# Patient Record
Sex: Female | Born: 1935 | Race: White | Hispanic: No | Smoking: Never smoker
Health system: Southern US, Community
[De-identification: ages and names within clinical notes are randomized; demographics above are authoritative.]

## PROBLEM LIST (undated history)

## (undated) DIAGNOSIS — H9190 Unspecified hearing loss, unspecified ear: Secondary | ICD-10-CM

## (undated) DIAGNOSIS — K219 Gastro-esophageal reflux disease without esophagitis: Secondary | ICD-10-CM

## (undated) DIAGNOSIS — T7840XA Allergy, unspecified, initial encounter: Secondary | ICD-10-CM

## (undated) DIAGNOSIS — T8859XA Other complications of anesthesia, initial encounter: Secondary | ICD-10-CM

## (undated) DIAGNOSIS — F329 Major depressive disorder, single episode, unspecified: Secondary | ICD-10-CM

## (undated) DIAGNOSIS — R112 Nausea with vomiting, unspecified: Secondary | ICD-10-CM

## (undated) DIAGNOSIS — M549 Dorsalgia, unspecified: Secondary | ICD-10-CM

## (undated) DIAGNOSIS — E039 Hypothyroidism, unspecified: Secondary | ICD-10-CM

## (undated) DIAGNOSIS — G20A1 Parkinson's disease without dyskinesia, without mention of fluctuations: Secondary | ICD-10-CM

## (undated) DIAGNOSIS — M1990 Unspecified osteoarthritis, unspecified site: Secondary | ICD-10-CM

## (undated) DIAGNOSIS — Z9889 Other specified postprocedural states: Secondary | ICD-10-CM

## (undated) DIAGNOSIS — E7800 Pure hypercholesterolemia, unspecified: Secondary | ICD-10-CM

## (undated) DIAGNOSIS — G20 Parkinson's disease: Secondary | ICD-10-CM

## (undated) DIAGNOSIS — J45909 Unspecified asthma, uncomplicated: Secondary | ICD-10-CM

## (undated) DIAGNOSIS — F419 Anxiety disorder, unspecified: Secondary | ICD-10-CM

## (undated) DIAGNOSIS — M25473 Effusion, unspecified ankle: Secondary | ICD-10-CM

## (undated) DIAGNOSIS — T4145XA Adverse effect of unspecified anesthetic, initial encounter: Secondary | ICD-10-CM

## (undated) DIAGNOSIS — J302 Other seasonal allergic rhinitis: Secondary | ICD-10-CM

## (undated) DIAGNOSIS — F32A Depression, unspecified: Secondary | ICD-10-CM

## (undated) HISTORY — PX: EYE SURGERY: SHX253

## (undated) HISTORY — DX: Hypothyroidism, unspecified: E03.9

## (undated) HISTORY — PX: ABDOMINAL HYSTERECTOMY: SHX81

## (undated) HISTORY — PX: NISSEN FUNDOPLICATION: SHX2091

## (undated) HISTORY — PX: KNEE ARTHROSCOPY: SUR90

## (undated) HISTORY — PX: CHOLECYSTECTOMY: SHX55

## (undated) HISTORY — DX: Allergy, unspecified, initial encounter: T78.40XA

---

## 1960-02-27 DIAGNOSIS — R58 Hemorrhage, not elsewhere classified: Secondary | ICD-10-CM

## 2000-11-24 ENCOUNTER — Ambulatory Visit (HOSPITAL_COMMUNITY): Admission: RE | Admit: 2000-11-24 | Discharge: 2000-11-24 | Payer: Self-pay | Admitting: Ophthalmology

## 2000-11-24 ENCOUNTER — Encounter: Payer: Self-pay | Admitting: Ophthalmology

## 2000-12-14 ENCOUNTER — Ambulatory Visit (HOSPITAL_COMMUNITY): Admission: RE | Admit: 2000-12-14 | Discharge: 2000-12-14 | Payer: Self-pay | Admitting: Family Medicine

## 2000-12-14 ENCOUNTER — Encounter: Payer: Self-pay | Admitting: Family Medicine

## 2001-02-06 ENCOUNTER — Ambulatory Visit (HOSPITAL_COMMUNITY): Admission: RE | Admit: 2001-02-06 | Discharge: 2001-02-06 | Payer: Self-pay | Admitting: Orthopedic Surgery

## 2001-04-07 ENCOUNTER — Ambulatory Visit (HOSPITAL_COMMUNITY): Admission: RE | Admit: 2001-04-07 | Discharge: 2001-04-07 | Payer: Self-pay | Admitting: Ophthalmology

## 2001-09-02 ENCOUNTER — Other Ambulatory Visit: Admission: RE | Admit: 2001-09-02 | Discharge: 2001-09-02 | Payer: Self-pay | Admitting: Family Medicine

## 2001-12-22 ENCOUNTER — Ambulatory Visit (HOSPITAL_COMMUNITY): Admission: RE | Admit: 2001-12-22 | Discharge: 2001-12-22 | Payer: Self-pay | Admitting: Family Medicine

## 2001-12-22 ENCOUNTER — Encounter: Payer: Self-pay | Admitting: Family Medicine

## 2001-12-24 ENCOUNTER — Encounter: Payer: Self-pay | Admitting: Family Medicine

## 2001-12-24 ENCOUNTER — Ambulatory Visit (HOSPITAL_COMMUNITY): Admission: RE | Admit: 2001-12-24 | Discharge: 2001-12-24 | Payer: Self-pay | Admitting: Family Medicine

## 2001-12-31 ENCOUNTER — Ambulatory Visit (HOSPITAL_COMMUNITY): Admission: RE | Admit: 2001-12-31 | Discharge: 2001-12-31 | Payer: Self-pay | Admitting: Family Medicine

## 2001-12-31 ENCOUNTER — Encounter: Payer: Self-pay | Admitting: Family Medicine

## 2002-01-07 ENCOUNTER — Encounter: Payer: Self-pay | Admitting: Family Medicine

## 2002-01-07 ENCOUNTER — Ambulatory Visit (HOSPITAL_COMMUNITY): Admission: RE | Admit: 2002-01-07 | Discharge: 2002-01-07 | Payer: Self-pay | Admitting: Family Medicine

## 2003-12-06 ENCOUNTER — Ambulatory Visit (HOSPITAL_COMMUNITY): Admission: RE | Admit: 2003-12-06 | Discharge: 2003-12-06 | Payer: Self-pay | Admitting: Unknown Physician Specialty

## 2003-12-09 ENCOUNTER — Ambulatory Visit (HOSPITAL_COMMUNITY): Admission: RE | Admit: 2003-12-09 | Discharge: 2003-12-09 | Payer: Self-pay | Admitting: Pulmonary Disease

## 2004-03-08 ENCOUNTER — Encounter: Admission: RE | Admit: 2004-03-08 | Discharge: 2004-06-06 | Payer: Self-pay | Admitting: Neurological Surgery

## 2004-06-07 ENCOUNTER — Encounter: Admission: RE | Admit: 2004-06-07 | Discharge: 2004-07-30 | Payer: Self-pay | Admitting: Neurological Surgery

## 2004-06-18 ENCOUNTER — Ambulatory Visit (HOSPITAL_COMMUNITY): Admission: RE | Admit: 2004-06-18 | Discharge: 2004-06-18 | Payer: Self-pay | Admitting: Neurological Surgery

## 2004-06-30 ENCOUNTER — Emergency Department (HOSPITAL_COMMUNITY): Admission: EM | Admit: 2004-06-30 | Discharge: 2004-06-30 | Payer: Self-pay | Admitting: Emergency Medicine

## 2004-07-20 ENCOUNTER — Ambulatory Visit (HOSPITAL_COMMUNITY): Admission: RE | Admit: 2004-07-20 | Discharge: 2004-07-20 | Payer: Self-pay | Admitting: Family Medicine

## 2004-12-22 ENCOUNTER — Emergency Department (HOSPITAL_COMMUNITY): Admission: EM | Admit: 2004-12-22 | Discharge: 2004-12-22 | Payer: Self-pay | Admitting: *Deleted

## 2005-02-04 ENCOUNTER — Emergency Department (HOSPITAL_COMMUNITY): Admission: EM | Admit: 2005-02-04 | Discharge: 2005-02-04 | Payer: Self-pay | Admitting: Emergency Medicine

## 2005-03-26 ENCOUNTER — Encounter: Admission: RE | Admit: 2005-03-26 | Discharge: 2005-03-26 | Payer: Self-pay | Admitting: Neurological Surgery

## 2005-05-06 ENCOUNTER — Inpatient Hospital Stay (HOSPITAL_COMMUNITY): Admission: EM | Admit: 2005-05-06 | Discharge: 2005-05-08 | Payer: Self-pay | Admitting: *Deleted

## 2006-05-06 ENCOUNTER — Ambulatory Visit: Payer: Self-pay | Admitting: Internal Medicine

## 2006-06-17 ENCOUNTER — Ambulatory Visit: Payer: Self-pay | Admitting: Internal Medicine

## 2006-07-28 ENCOUNTER — Ambulatory Visit: Payer: Self-pay | Admitting: Internal Medicine

## 2006-12-01 ENCOUNTER — Ambulatory Visit (HOSPITAL_COMMUNITY): Admission: RE | Admit: 2006-12-01 | Discharge: 2006-12-02 | Payer: Self-pay | Admitting: Obstetrics & Gynecology

## 2007-06-23 ENCOUNTER — Ambulatory Visit (HOSPITAL_COMMUNITY): Admission: RE | Admit: 2007-06-23 | Discharge: 2007-06-23 | Payer: Self-pay | Admitting: Family Medicine

## 2009-06-25 ENCOUNTER — Emergency Department (HOSPITAL_COMMUNITY): Admission: EM | Admit: 2009-06-25 | Discharge: 2009-06-25 | Payer: Self-pay | Admitting: Emergency Medicine

## 2009-12-01 ENCOUNTER — Emergency Department (HOSPITAL_COMMUNITY): Admission: EM | Admit: 2009-12-01 | Discharge: 2009-12-01 | Payer: Self-pay | Admitting: Emergency Medicine

## 2010-02-17 ENCOUNTER — Emergency Department (HOSPITAL_COMMUNITY): Admission: EM | Admit: 2010-02-17 | Discharge: 2010-02-17 | Payer: Self-pay | Admitting: Emergency Medicine

## 2010-03-09 ENCOUNTER — Ambulatory Visit (HOSPITAL_COMMUNITY): Admission: RE | Admit: 2010-03-09 | Discharge: 2010-03-09 | Payer: Self-pay | Admitting: Family Medicine

## 2010-08-26 LAB — URINALYSIS, ROUTINE W REFLEX MICROSCOPIC
Bilirubin Urine: NEGATIVE
Glucose, UA: NEGATIVE mg/dL
Hgb urine dipstick: NEGATIVE
Ketones, ur: NEGATIVE mg/dL
Nitrite: NEGATIVE
Protein, ur: NEGATIVE mg/dL
Specific Gravity, Urine: 1.015 (ref 1.005–1.030)
Urobilinogen, UA: 0.2 mg/dL (ref 0.0–1.0)
pH: 8 (ref 5.0–8.0)

## 2010-08-26 LAB — URINE MICROSCOPIC-ADD ON

## 2010-08-28 ENCOUNTER — Other Ambulatory Visit: Payer: Self-pay | Admitting: Neurological Surgery

## 2010-08-28 DIAGNOSIS — M48061 Spinal stenosis, lumbar region without neurogenic claudication: Secondary | ICD-10-CM

## 2010-08-28 DIAGNOSIS — M47812 Spondylosis without myelopathy or radiculopathy, cervical region: Secondary | ICD-10-CM

## 2010-09-05 ENCOUNTER — Ambulatory Visit
Admission: RE | Admit: 2010-09-05 | Discharge: 2010-09-05 | Disposition: A | Discharge status: Home / Self Care | Payer: Medicare Other | Source: Ambulatory Visit | Attending: Neurological Surgery | Admitting: Neurological Surgery

## 2010-09-05 DIAGNOSIS — M48061 Spinal stenosis, lumbar region without neurogenic claudication: Secondary | ICD-10-CM

## 2010-09-05 DIAGNOSIS — M47812 Spondylosis without myelopathy or radiculopathy, cervical region: Secondary | ICD-10-CM

## 2010-09-25 ENCOUNTER — Other Ambulatory Visit (HOSPITAL_COMMUNITY): Payer: Self-pay | Admitting: Family Medicine

## 2010-09-25 DIAGNOSIS — E041 Nontoxic single thyroid nodule: Secondary | ICD-10-CM

## 2010-09-28 ENCOUNTER — Ambulatory Visit (HOSPITAL_COMMUNITY)
Admission: RE | Admit: 2010-09-28 | Discharge: 2010-09-28 | Disposition: A | Discharge status: Home / Self Care | Payer: Medicare Other | Source: Ambulatory Visit | Attending: Family Medicine | Admitting: Family Medicine

## 2010-09-28 DIAGNOSIS — E041 Nontoxic single thyroid nodule: Secondary | ICD-10-CM

## 2010-09-28 DIAGNOSIS — E049 Nontoxic goiter, unspecified: Secondary | ICD-10-CM | POA: Insufficient documentation

## 2010-10-18 ENCOUNTER — Other Ambulatory Visit: Payer: Self-pay | Admitting: Neurological Surgery

## 2010-10-18 DIAGNOSIS — M4716 Other spondylosis with myelopathy, lumbar region: Secondary | ICD-10-CM

## 2010-10-19 ENCOUNTER — Ambulatory Visit
Admission: RE | Admit: 2010-10-19 | Discharge: 2010-10-19 | Disposition: A | Discharge status: Home / Self Care | Payer: Medicare Other | Source: Ambulatory Visit | Attending: Neurological Surgery | Admitting: Neurological Surgery

## 2010-10-19 DIAGNOSIS — M4716 Other spondylosis with myelopathy, lumbar region: Secondary | ICD-10-CM

## 2010-10-23 NOTE — Op Note (Signed)
NAME:  [name], [name] NO.:  [id number]   MEDICAL RECORD NO.:  [id number]          PATIENT TYPE:  AMB   LOCATION:  [place]                           FACILITY:  [facility]   PHYSICIAN:  Alden Hipp, M.D.   DATE OF BIRTH:  Sep 24, 1933   DATE OF PROCEDURE:  12/01/2006  DATE OF DISCHARGE:                               OPERATIVE REPORT   PREOPERATIVE DIAGNOSIS:  Symptomatic rectocele.   POSTOPERATIVE DIAGNOSIS:  Symptomatic rectocele.   PROCEDURE:  Posterior repair.   SURGEON:  Alden Hipp, M.D.   ASSISTANT:  Lenard Galloway, M.D.   ANESTHESIA:  General endotracheal.   ESTIMATED BLOOD LOSS:  Less than 100 mL.   FINDINGS:  A third-degree rectocele. Minimal (1) cystocele,  and good  vaginal vault support.   INDICATIONS:  This is a 77 year old female who noticed vaginal bulging  for the last 1 to 2 months. She was evaluated by her family physician,  Dr. Leslie Andrea, who diagnosed a rectocele and referred her for  evaluation. The findings were discussed with the patient. She did have  some symptoms of urinary incontinence, but these were minimal and  appeared to be mixed between urgency incontinence and genuine stress  incontinence. The options were discussed with the patient to proceed  with cystometric's to determine the best course of treatment for her  incontinence, but the patient felt at this point in time that was not  her major problem and wants to proceed with the rectocele repair.   PROCEDURE:  The patient was taken to the operating room and placed under  general anesthesia. The perineum and vagina were prepped and draped in  sterile fashion. The perineal body was incised, and the mucosa over the  rectocele was dissected free. The rectocele was repaired with  interrupted 2-0 Vicryl sutures. The perineal body was supported  with 2-0 Vicryl suture. After the repair had been performed, the vaginal  mucosa was reapposed with running interlocking 2-0 Vicryl  suture, and  the skin over the peritoneum was closed with subcuticular 2-0 Vicryl  suture. The patient tolerated the procedure well and left the operating  room in good condition.      Alden Hipp, M.D.  Electronically Signed     RK/MEDQ  D:  12/01/2006  T:  12/01/2006  Job:  [id number]   cc:   Estill Bamberg. Karie Kirks, M.D.  Fax: [phone]

## 2010-10-23 NOTE — H&P (Signed)
[name], [name] NO.:  [id number]   MEDICAL RECORD NO.:  [id number]         PATIENT TYPE:  WOIB   LOCATION:                                FACILITY:  [facility]   PHYSICIAN:  Alden Hipp, M.D.   DATE OF BIRTH:  03/22/1934   DATE OF ADMISSION:  12/01/2006  DATE OF DISCHARGE:  12/02/2006                              HISTORY & PHYSICAL   CHIEF COMPLAINT:  Rectocele.   HISTORY OF PRESENT ILLNESS:  This is a 77 year old gravida 2, para 2,  female who noticed bulging from her vagina over the last two months or  so.  This problem has made it difficult for her to pass her stools, as  far as putting her fingers in her vagina for posterior support in order  to pass a stool. The patient has some urinary incontinence  but this  sounds like urgency incontinence where the patient has mostly trips to  the bathroom at night and also has difficulty controlling her urine  mainly when her bladder is full and the urge presents.   PAST MEDICAL HISTORY:  1. Irritable bowel syndrome.  2. Paraesophageal reflux disease.  3. History of back pain.  4. History of Parkinson's disease.  5. Chronic Bronchitis  6. Pedal edema unknown etiology   PAST SURGICAL HISTORY:  1. Hysterectomy and anterior repair in 1975.  2. Gallbladder surgery in 1998.  3. Paraesophageal hernia reflux repair in April 2001.  4. Repair of knee cartilage in August 2002.  5. Eye surgery, macular surgery done in June 2002, cataract surgery in      November 2002.   MEDICATIONS:  1. Furosemide 40 mg daily.  2. Prevacid one capsule every other day.  3. Naprosyn two times a day.  4. Zoloft for depression.  5. Potassium chloride two per day.  6. Mirapex two in the morning.  7. Alprazolam 0.5 mg at bedtime.  8. Singulair 5 mg in the middle of the day.  9. Albuterol inhaler two puffs every four hours as needed.  10.Hydrocodone occasionally but she has had that in months and that      was for her back pain.  11.Zyrtec and Nasonex for allergies.   ALLERGIES:  1. Keflex  2. Latex  3. Demerol    She has never received a blood transfusion.   SOCIAL HISTORY:  She is retired.  She has been married 64 years.  She is  a nonsmoker and she exercises regularly with walking.   FAMILY HISTORY:  Positive for heart disease in a brother and two uncles.  Stroke in her father and mother.  Diabetes in a grandfather and  grandmother.  She has no family history of breast, ovarian, uterine, or  colon cancer.   REVIEW OF SYSTEMS:  Noncontributory.   PHYSICAL EXAMINATION:  VITAL SIGNS:  She is 5 feet 5 inches tall, 186  pounds.  Blood pressure 110/70,  HEENT:  Normal.  NECK:  Supple without thyromegaly.  CHEST:  Clear and no rales were appreciated.  HEART:  Regular rate and rhythm without murmurs or gallops.  ABDOMEN:  Soft, without hepatosplenomegaly.  BREASTS:  Without masses.  PELVIC:  3+ cystocele.  Her anterior support was good with minimal  cystocele with Valsalva.  Her cervix and uterus were absent and her  adnexa were not palpable.   IMPRESSION:  The patient's primary problem is a rectocele.  She has some  mild urinary incontinence which may or may not be related to stress but  sounded more like urgency.  A long discussion was carried out with the  patient about doing bladder studies prior to surgery in case a combined  procedure would be beneficial.  The patient felt at this time that she  would prefer just to proceed with the rectocele repair because the  urinary symptoms were not of significant concern to her at this time.   PLAN:  Admit to the hospital for a repair of rectocele.      Alden Hipp, M.D.  Electronically Signed     RK/MEDQ  D:  11/30/2006  T:  11/30/2006  Job:  [id number]

## 2010-10-26 NOTE — Op Note (Signed)
[facility]  Patient:    GRAYSON, WHITE Visit Number: [id number] MRN: [id number]          Service Type: DSU Location: DAY Attending Physician:  Tarri Glenn Page Dictated by:   Laurice Record. Aplington, M.D. Proc. Date: 02/06/01 Admit Date:  02/06/2001                             Operative Report  PREOPERATIVE DIAGNOSIS:   Torn medial meniscus, right knee.  POSTOPERATIVE DIAGNOSIS:  Torn medial meniscus, right knee.  OPERATION PERFORMED:  Right knee arthroscopy with partial medial meniscectomy.  SURGEON:  Laurice Record. Aplington, M.D.  ASSISTANT:  Nurse.  ANESTHESIA:  General.  PATHOLOGY AND JUSTIFICATION FOR PROCEDURE:  She developed pain in her right knee around the end of June. Plain x-rays looked normal but an MRI demonstrated anterior and posterior horn tears of the medial meniscus and this was confirmed at surgery. Because of the severe pain, she is here today for the arthroscopic intervention.  DESCRIPTION OF PROCEDURE:  Satisfactory general anesthesia, pneumatic tourniquet, thigh stabilizer, the right knee was prepped with Duraprep, draped in a sterile field. Superior and medial saline inflow. First through and anterolateral portal, the medial compartment of the knee joint was evaluated. There was a good bit of disruption of the anterior medial meniscus and a bucket handle type tear was noted. I managed this by cutting the posterior portion of the bucket handle at the junction of the anterior mid third with scissors and then removing the fragment with a combination of baskets and the 3.5 shaver, I smoothed down the junction of the torn and normal meniscus with scissors. Final pictures were taken with the remaining rim being stable on probing. She had some very minimal wear of both the medial and femoral condyle and medial tibial plateau grade 1 out of 4. Posteriorly I noted a fragment of meniscus which I removed. She then had a small radial  tear of the posterior curve which I resected back to a stable rim with baskets and shaved down until smooth with a 3.5 shaver. Looking up in the medial gutter and suprapatellar area, no abnormalities were noted. I then reversed portals. The ACL was intact, the lateral joint was basically normal. There was some very minimal fraying of the lateral meniscus which did not require shaving. The knee joint was then irrigated until clear and all fluid possible removed. The two anterior portals were closed with 4-0 nylon, 20 cc of 0.5% Marcaine with adrenaline and 4 mg of morphine were then instilled through the inflap rasp which was removed and this portal closed as well with 4-0 nylon. Betadine Adaptic dry sterile dressing were applied. Tourniquet was released. The patient tolerated the procedure well and was taken to the recovery room in satisfactory condition with no known complications. Dictated by:   Laurice Record. Aplington, M.D. Attending Physician:  Clare Gandy DD:  02/06/01 TD:  02/06/01 Job: [id number]

## 2010-10-26 NOTE — Group Therapy Note (Signed)
NAMELYNETTE, TOPETE                ACCOUNT NO.:  [id number]   MEDICAL RECORD NO.:  [id number]          PATIENT TYPE:  INP   LOCATION:  A313                          FACILITY:  [facility]   PHYSICIAN:  Estill Bamberg. Karie Kirks, M.D.DATE OF BIRTH:  1934/05/04   DATE OF PROCEDURE:  05/07/2005  DATE OF DISCHARGE:                                   PROGRESS NOTE   SUBJECTIVE:  She is feeling better today.  She is making many trips to the  bathroom and actually gets there now, which is definitely improved.  She  feels her abdomen is rumbling a lot, but really no significant pain.   OBJECTIVE:  VITAL SIGNS:  Temperature 97.1, pulse 85, respirations 18, blood  pressure 129/69.  GENERAL APPEARANCE:  She is supine in bed.  No acute distress.  Well-  developed, well-nourished.  Nontoxic.  ABDOMEN:  Soft, without hepatosplenomegaly or mass or tenderness.  Bowel  sounds are very active, however.  HEART:  Regular rhythm rate of about 80.  LUNGS:  Clear.  Moving air well.  EXTREMITIES:  No edema in the ankles.  SKIN:  Color is pretty good.   LABORATORY DATA:  Today, the Met-7 showed potassium 3.1, chloride 113, BUN  5, creatinine 1.0 and clostridium difficile antigen test x3 was negative.  Occult blood was positive x2, negative x1.  Fecal white cells and OMP was  negative.   ASSESSMENT:  Probably viral gastroenteritis.   PLAN:  Check culture for salmonella, shigella.  Continue Levaquin 5 mg IV  daily.  Add K-Ciel 20 mEq p.o. b.i.d.  Recheck potassium level in the  morning.  Up in chair.      Estill Bamberg. Karie Kirks, M.D.  Electronically Signed     SDK/MEDQ  D:  05/07/2005  T:  05/08/2005  Job:  [id number]

## 2010-10-26 NOTE — Op Note (Signed)
[facility]  Patient:    CHARLETTA, VOIGHT Visit Number: [id number] MRN: [id number]          Service Type: DSU Location: DAY Attending Physician:  Tarri Glenn Page Proc. Date: 02/06/01 Admit Date:  02/06/2001                             Operative Report  No dictation. Attending Physician:  Clare Gandy DD:  02/06/01 TD:  02/06/01 Job: [id number]

## 2010-10-26 NOTE — H&P (Signed)
NAMELOELLA, HICKLE                ACCOUNT NO.:  [id number]   MEDICAL RECORD NO.:  [id number]          PATIENT TYPE:  OBV   LOCATION:  A313                          FACILITY:  [facility]   PHYSICIAN:  Estill Bamberg. Karie Kirks, M.D.DATE OF BIRTH:  09-07-1933   DATE OF ADMISSION:  05/05/2005  DATE OF DISCHARGE:  LH                                HISTORY & PHYSICAL   HISTORY OF PRESENT ILLNESS:  This 77 year old woman was doing well the  morning of admission, and then she had sudden onset of nausea, vomiting and  diarrhea, really without abdominal pain.  In the past, she had a Nissen  fundoplication, has not been able to vomit since then.  She was vomiting  yesterday.  She felt just miserable.   She denied earaches, sore throat, facial pain, but had a burning discomfort  in the anterior neck.  She does have a cough occasionally, productive of  sputum.  She has had no cardiac symptoms.  She is free of GU symptomatology,  but has the GI symptomatology as above, including copious diarrhea.  She had  shaking chills last night.   CURRENT MEDICATIONS:  1.  Zoloft 100 mg daily.  2.  Naprosyn 5 mg b.i.d.  3.  Prevacid 30 mg daily.  4.  Premarin 0.3 mg daily.  5.  Zyrtec 10 mg p.r.n. allergy.  6.  Benadryl 25 mg p.r.n. allergy.  7.  Vytorin q.h.s.  8.  Alprazolam 0.5 mg p.r.n.  9.  Hydrocodone half tablet p.r.n.  10. Lasix daily.  11. Fish oil.  12. Multivitamin.  13. Calcium is Os-Cal.   PAIN MANAGEMENT:  1.  Chronic back pain secondary to lumbar disk disease.  2.  Estrogen replacement.  3.  Depression.   PHYSICAL EXAMINATION:  VITAL SIGNS:  Temperature 96.7, blood pressure  103/61, pulse 84, respirations 20.  O2 saturation 98% on room air.  GENERAL APPEARANCE:  At the time I examined her, she said she still felt  miserable, but somewhat better having IV fluids and some medications.  She  was oriented and alert, no acute distress.  Well-developed, well-nourished.  HEENT:  Mucous membranes  are dry, but the oral cavity was otherwise normal.  There were negative anterior cervical nodes.  There was no axillary or  supraclavicular adenopathy.  LUNGS:  Clear throughout.  She was moving air well.  Color was good.  HEART:  Regular rate and rhythm of about 80 without murmurs.  ABDOMEN:  Soft without hepatosplenomegaly or mass.  Minimal tenderness.  EXTREMITIES:  No edema of the ankles.   LABORATORY DATA:  Admission white cell count was 20,000 with 32% bands, 63  neutrophils, 3 lymphs, but by this morning, dropped to 10,500 with 93  neutrophils and 3 lymphs.  Met-7 on admission showed sodium of 133, now 135.  Glucose on admission was 151 now 143.   ADMISSION DIAGNOSES:  Probable vital gastroenteritis.   OTHER DIAGNOSES:  1.  Given the increased band count, can include pneumonia or pyelonephritis.  2.  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  3.  Status post  esophageal surgery at [facility] for a hiatal hernia.   PLAN:  Discussed all this with the patient and her husband  She is currently  on IV fluids, normal saline 125 cc an hour, Levaquin 500 mg IV q.24 h,  Protonix 40 mg IV daily, Zofran 8 mg IV q.8 h p.r.n. nausea and vomiting and  Imodium loose BM's.  Chest x-ray and guaiac testing of stool was pending.  She is on Zoloft 100 mg p.o. daily, Mirapex 0.125 mg daily, alprazolam 0.5  mg q.h.s. and Vicodin 7.5/500 p.r.n. pain q.6 h.      Estill Bamberg. Karie Kirks, M.D.  Electronically Signed     SDK/MEDQ  D:  05/06/2005  T:  05/06/2005  Job:  [id number]

## 2010-10-26 NOTE — Op Note (Signed)
[facility]  Patient:    [name], [name]                       MRN: [id number] Proc. Date: 11/24/00 Adm. Date:  [id number] Disc. 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  PROCEDURE:  Posterior vitrectomy with membrane peel left eye.  Epiretinal tissue with internal limiting membrane removal.  SURGEON:  Nolon Bussing, M.D.  ANESTHESIA:  Local retrobulbar anesthesia control.  INDICATIONS:  The patient is a 77 year old woman with significant vision loss on the basis of epiretinal membrane, topographic distortion, internal limiting membrane striae.  This is an attempt to remove the traction so as to allow the fovea to function.  The patient understands the risks of anesthesia including the rare occurrence of death, but also to the eye including hemorrhage, infection, scarring, need for further surgery, no change in vision, loss of vision, or progression of disease despite intervention.  Appropriate signed consent was obtained.  DESCRIPTION OF PROCEDURE:  The patient was taken to the operating room.  In the operating room, appropriate monitoring followed by mild sedation.  0.75% Marcaine diluted with 5 cc retrobulbar and 5 cc laterally fashion modified Kirk Ruths.  The left periocular region was sterilely prepped and draped in the usual ophthalmic fashion.  A lid speculum was applied.  Conjunctival peritomy fashion temporally and superonasally.  A 4 mm infusion was then secured 4 mm posterior to the limbus in the inferotemporal quadrant.  Placement in the vitreous cavity verified visually.  Superior sclerotomy was then fashion. Wild microscope placed into the  position with a Biome attachment.  Core vitrectomy was then begun.  Iatrogenic placement of vitreous detachment was necessary.  The vitreous skirt was then elevated and trimmed 360 degrees anterior to the equator.  The internal limiting membrane was engaged with a rice pick and with the Dorc forceps removed in a circumferential fashion around the edge of the fovea.  All obvious tractions of foveal topography had been removed.  At this time, peripheral retina was inspected and found to be free of retinal holes or tears.  The instruments were removed from the eye.  Superior sclerotomy closed with 7-0 Vicryl suture.  The infusion was removed and similarly closed with 7-0 Vicryl suture.  Subconjunctival injection of antibiotics were applied.  Conjunctiva had also been closed with 7-0 Vicryl suture.  A sterile patch and Fox shield applied to the left eye.  The patient was taken to the shortstay area to be discharged home as an outpatient to be seen the next day in the office setting. DD:  11/24/00 TD:  11/24/00 Job: [id number]

## 2010-10-26 NOTE — Discharge Summary (Signed)
[name], [name]                ACCOUNT NO.:  [id number]   MEDICAL RECORD NO.:  [id number]          PATIENT TYPE:  INP   LOCATION:  A313                          FACILITY:  [facility]   PHYSICIAN:  Estill Bamberg. Karie Kirks, M.D.DATE OF BIRTH:  08-03-1933   DATE OF ADMISSION:  05/05/2005  DATE OF DISCHARGE:  11/29/2006LH                                 DISCHARGE SUMMARY   This 77 year old woman was admitted with what appeared to be a viral  gastroenteritis but she had essentially a 4-day hospitalization extending  from May 05, 2005 to May 08, 2005.  She was put in under  observation the first day but needed a full admission May 06, 2005.   Lab work in the hospital showed admission white cell count of 20,000 of  which 32% were bands, 63 neutrophils, 3 lymphs, 2 mono's.  The H&H were 14.5  and 42.3, platelet count 13,000.  Her CMP showed a sodium 133, potassium  3.6, glucose 151.  The UA was negative and fecal wbc's were negative and  fecal occult blood was positive two out of three samples, O&P negative and  Clostridium difficile x3 were negative.  Her sed rate was 12.  Recheck CBC  her second day showed the white cell count dropped to 10,500 with 93  neutrophils and 3 lymphs.  The BMP showed potassium now 3.5, BUN 16,  creatinine 0.9 but by her third day potassium had dropped to 3.1 and the BUN  to 5 and by day of discharge her white cell count was down 4700 with a  normal differential.  The BMP again showed potassium 3.1 and a BUN now of 2.   Portable chest x-ray on admission showed chronic bronchitic change and  increased basilar atelectasis.   She is admitted to the hospital, put on IV of normal saline at 125 mL/hr,  Levaquin 500 mg IV q.24 h., __________  40 mg IV daily, Zofran 8 mg q.8 h.  p.r.n. nausea and vomiting and Imodium 2 mg for loose BMs.  She is continued  on Zoloft 100 mg daily, Mirapex 0.125 mg daily, alprazolam 0.5 mg at bedtime  and Vicodin 7.5/500 p.r.n.  pain.  She also was continued on Vytorin 10/20  daily.   When she was still fairly severely ill, her second day she had full  admission and was followed for another few days before she had reached the  point of maximum benefit from hospitalization.  She was started on KCl 20  mEq twice daily her third day given her low potassium level.  Stools for  routine culture were requested, despite the fact she had been on Levaquin  for several days, with the thought she might have a salmonella or shigella.   She was much improved ready for discharge home her fourth day.   FINAL DISCHARGE DIAGNOSES:  1.  Probable viral gastroenteritis.  2.  Chronic back pain secondary to lumbar disk disease.  3.  Menopausal symptoms.  4.  Depression.  5.  Hypercholesterolemia.  6.  Insomnia.  7.  Nasal allergies to pollens.  8.  Reflux  esophagitis.  9.  Generalized osteoarthrosis.   Patient sent home on Levaquin 500 mg daily for 7 days (#7 no refills) and  KCl 20 mEq twice daily for 10 days (#20 no refills).  She is to continue all  other meds as noted in the admission H&P to include Zoloft, Naprosyn,  Prevacid, Premarin, Zyrtec, Benadryl, Vytorin, alprazolam, hydrocodone,  Lasix, fish oil, multivitamins and calcium.  Follow up in the office within  a week.  Note time spent today reviewing her paper medical and electronic  medical records, examining the patient, talking to the patient, writing out  her list of meds, writing out prescriptions, formulating a plan, dictating a  note was 35 minutes.      Estill Bamberg. Karie Kirks, M.D.  Electronically Signed     SDK/MEDQ  D:  05/08/2005  T:  05/08/2005  Job:  [id number]

## 2010-11-06 ENCOUNTER — Other Ambulatory Visit (HOSPITAL_COMMUNITY): Payer: Self-pay | Admitting: Family Medicine

## 2010-11-06 ENCOUNTER — Ambulatory Visit (HOSPITAL_COMMUNITY)
Admission: RE | Admit: 2010-11-06 | Discharge: 2010-11-06 | Disposition: A | Discharge status: Home / Self Care | Payer: Medicare Other | Source: Ambulatory Visit | Attending: Family Medicine | Admitting: Family Medicine

## 2010-11-06 DIAGNOSIS — R05 Cough: Secondary | ICD-10-CM

## 2010-11-06 DIAGNOSIS — J40 Bronchitis, not specified as acute or chronic: Secondary | ICD-10-CM

## 2010-11-06 DIAGNOSIS — R059 Cough, unspecified: Secondary | ICD-10-CM | POA: Insufficient documentation

## 2010-12-10 ENCOUNTER — Ambulatory Visit (HOSPITAL_COMMUNITY): Admission: RE | Admit: 2010-12-10 | Payer: Medicare Other | Source: Ambulatory Visit

## 2010-12-10 ENCOUNTER — Other Ambulatory Visit (HOSPITAL_COMMUNITY): Payer: Self-pay | Admitting: Family Medicine

## 2010-12-10 ENCOUNTER — Ambulatory Visit (HOSPITAL_COMMUNITY)
Admission: RE | Admit: 2010-12-10 | Discharge: 2010-12-10 | Disposition: A | Discharge status: Home / Self Care | Payer: Medicare Other | Source: Ambulatory Visit | Attending: Family Medicine | Admitting: Family Medicine

## 2010-12-10 DIAGNOSIS — R609 Edema, unspecified: Secondary | ICD-10-CM

## 2010-12-10 DIAGNOSIS — J40 Bronchitis, not specified as acute or chronic: Secondary | ICD-10-CM

## 2010-12-10 DIAGNOSIS — R05 Cough: Secondary | ICD-10-CM | POA: Insufficient documentation

## 2010-12-10 DIAGNOSIS — R059 Cough, unspecified: Secondary | ICD-10-CM | POA: Insufficient documentation

## 2011-01-11 ENCOUNTER — Other Ambulatory Visit: Payer: Self-pay | Admitting: Neurological Surgery

## 2011-01-11 DIAGNOSIS — M4716 Other spondylosis with myelopathy, lumbar region: Secondary | ICD-10-CM

## 2011-01-17 ENCOUNTER — Ambulatory Visit
Admission: RE | Admit: 2011-01-17 | Discharge: 2011-01-17 | Disposition: A | Discharge status: Home / Self Care | Payer: Medicare Other | Source: Ambulatory Visit | Attending: Neurological Surgery | Admitting: Neurological Surgery

## 2011-01-17 DIAGNOSIS — M4716 Other spondylosis with myelopathy, lumbar region: Secondary | ICD-10-CM

## 2011-01-17 MED ORDER — IOHEXOL 180 MG/ML  SOLN
1.0000 mL | Freq: Once | INTRAMUSCULAR | Status: AC | PRN
  Administered 2011-01-17: 1 mL via EPIDURAL

## 2011-01-17 MED ORDER — METHYLPREDNISOLONE ACETATE 40 MG/ML INJ SUSP (RADIOLOG
120.0000 mg | Freq: Once | INTRAMUSCULAR | Status: AC
  Administered 2011-01-17: 120 mg via EPIDURAL

## 2011-01-17 MED ORDER — IOHEXOL 180 MG/ML  SOLN: 17.0000 mL | Freq: Once | INTRAMUSCULAR | Status: DC | PRN

## 2011-01-17 MED ORDER — IOHEXOL 180 MG/ML  SOLN: 17.0000 mL | Freq: Once | INTRAMUSCULAR | Status: AC | PRN

## 2011-03-27 LAB — CBC
HCT: 34.5 — ABNORMAL LOW
HCT: 39.7
Hemoglobin: 13.1
MCHC: 33
Platelets: 336
Platelets: 367
RDW: 14.7 — ABNORMAL HIGH
RDW: 15 — ABNORMAL HIGH
WBC: 8.4

## 2011-03-27 LAB — BASIC METABOLIC PANEL
BUN: 10
Calcium: 9.4
Creatinine, Ser: 0.92
GFR calc non Af Amer: >60
Glucose, Bld: 95
Potassium: 3.9

## 2011-06-13 ENCOUNTER — Emergency Department (HOSPITAL_COMMUNITY)
Admission: EM | Admit: 2011-06-13 | Discharge: 2011-06-13 | Disposition: A | Discharge status: Home / Self Care | Payer: Medicare Other | Attending: Emergency Medicine | Admitting: Emergency Medicine

## 2011-06-13 ENCOUNTER — Emergency Department (HOSPITAL_COMMUNITY): Payer: Medicare Other

## 2011-06-13 ENCOUNTER — Encounter: Payer: Self-pay | Admitting: *Deleted

## 2011-06-13 DIAGNOSIS — W19XXXA Unspecified fall, initial encounter: Secondary | ICD-10-CM

## 2011-06-13 DIAGNOSIS — J302 Other seasonal allergic rhinitis: Secondary | ICD-10-CM | POA: Insufficient documentation

## 2011-06-13 DIAGNOSIS — M546 Pain in thoracic spine: Secondary | ICD-10-CM | POA: Insufficient documentation

## 2011-06-13 DIAGNOSIS — M545 Low back pain, unspecified: Secondary | ICD-10-CM | POA: Insufficient documentation

## 2011-06-13 DIAGNOSIS — G20A1 Parkinson's disease without dyskinesia, without mention of fluctuations: Secondary | ICD-10-CM | POA: Insufficient documentation

## 2011-06-13 DIAGNOSIS — W010XXA Fall on same level from slipping, tripping and stumbling without subsequent striking against object, initial encounter: Secondary | ICD-10-CM | POA: Insufficient documentation

## 2011-06-13 DIAGNOSIS — Y92009 Unspecified place in unspecified non-institutional (private) residence as the place of occurrence of the external cause: Secondary | ICD-10-CM | POA: Insufficient documentation

## 2011-06-13 DIAGNOSIS — M549 Dorsalgia, unspecified: Secondary | ICD-10-CM | POA: Insufficient documentation

## 2011-06-13 DIAGNOSIS — G20 Parkinson's disease: Secondary | ICD-10-CM | POA: Insufficient documentation

## 2011-06-13 DIAGNOSIS — Z9079 Acquired absence of other genital organ(s): Secondary | ICD-10-CM | POA: Insufficient documentation

## 2011-06-13 HISTORY — DX: Dorsalgia, unspecified: M54.9

## 2011-06-13 HISTORY — DX: Parkinson's disease without dyskinesia, without mention of fluctuations: G20.A1

## 2011-06-13 HISTORY — DX: Other seasonal allergic rhinitis: J30.2

## 2011-06-13 HISTORY — DX: Parkinson's disease: G20

## 2011-06-13 MED ORDER — OXYCODONE-ACETAMINOPHEN 5-325 MG PO TABS: 1.0000 | ORAL_TABLET | Freq: Three times a day (TID) | ORAL | Status: AC | PRN

## 2011-06-13 MED ORDER — OXYCODONE-ACETAMINOPHEN 5-325 MG PO TABS
1.0000 | ORAL_TABLET | Freq: Once | ORAL | Status: AC
  Administered 2011-06-13: 1 via ORAL
  Filled 2011-06-13: qty 1

## 2011-06-13 NOTE — ED Provider Notes (Signed)
History     CSN: [id number]  Arrival date & time 06/13/11  1846   First MD Initiated Contact with Patient 06/13/11 1950      Chief Complaint  Patient presents with  . Fall     Patient is a 78 y.o. female presenting with fall. The history is provided by the patient and a relative.  Fall The accident occurred less than 1 hour ago. The fall occurred while walking. Point of impact: bilaterla hands/knees. Pain location: upper and lower back. The pain is mild. Pertinent negatives include no abdominal pain, no vomiting, no headaches and no loss of consciousness. The symptoms are aggravated by activity. Treatment on scene includes a c-collar and a backboard.  Pt was at local nursing facility, visiting family when she tripped over a rug and fell forward She braced herself with her hands/knees No head injury No LOC No cp/sob/dizziness Now has pain in upper and lower back No extremity pain/injury reported No facial injury noted  Past Medical History  Diagnosis Date  . Parkinson disease   . Back pain   . Seasonal allergies     Past Surgical History  Procedure Date  . Cholecystectomy   . Knee arthroscopy   . Abdominal hysterectomy   . Eye surgery     History reviewed. No pertinent family history.  History  Substance Use Topics  . Smoking status: Never Smoker   . Smokeless tobacco: Not on file  . Alcohol Use: 0.6 oz/week    1 Glasses of wine per week    OB History    Grav Para Term Preterm Abortions TAB SAB Ect Mult Living                  Review of Systems  Gastrointestinal: Negative for vomiting and abdominal pain.  Neurological: Negative for loss of consciousness and headaches.    Allergies  Cephalosporins; Latex; and Meperidine and related  Home Medications   Current Outpatient Rx  Name Route Sig Dispense Refill  . ACETAMINOPHEN 500 MG PO TABS Oral Take 500 mg by mouth daily as needed. For pain     . ALPRAZOLAM 1 MG PO TABS Oral Take 0.5 mg by mouth at  bedtime as needed. For sleep     . CARBIDOPA-LEVODOPA 10-100 MG PO TABS Oral Take 1 tablet by mouth 2 (two) times daily.      Marland Kitchen CETIRIZINE-PSEUDOEPHEDRINE ER 5-120 MG PO TB12 Oral Take 1 tablet by mouth as needed. For sinus     . LASIX PO Oral Take 1 tablet by mouth daily.      . IBUPROFEN 200 MG PO TABS Oral Take 200 mg by mouth every 6 (six) hours as needed. For pai     . MONTELUKAST SODIUM 10 MG PO TABS Oral Take 10 mg by mouth at bedtime.      Marland Kitchen NAPROXEN 500 MG PO TABS Oral Take 500 mg by mouth 2 (two) times daily with a meal. For pain     . POTASSIUM CHLORIDE CRYS ER 10 MEQ PO TBCR Oral Take 20 mEq by mouth daily. With Lasix     . SERTRALINE HCL 100 MG PO TABS Oral Take 100 mg by mouth every morning.        BP 118/82  Pulse 72  Temp(Src) 98.3 F (36.8 C) (Oral)  Resp 20  Ht 5' 5"  (1.651 m)  Wt 165 lb (74.844 kg)  BMI 27.46 kg/m2  SpO2 97%  Physical Exam  CONSTITUTIONAL: Well developed/well nourished  HEAD AND FACE: Normocephalic/atraumatic EYES: EOMI/PERRL ENMT: Mucous membranes moist NECK: supple no meningeal signs SPINE:cervical spine nontender, NEXUS criteria met, +thoracic/lumbar tenderness and paraspinal tenderness No bruising/crepitance/stepoffs noted to spine Patient maintained in spinal precautions/logroll utilized CV: S1/S2 noted, no murmurs/rubs/gallops noted LUNGS: Lungs are clear to auscultation bilaterally, no apparent distress ABDOMEN: soft, nontender, no rebound or guarding GU:no cva tenderness NEURO: Pt is awake/alert, moves all extremitiesx4 GCS 15 EXTREMITIES: pulses normal, full ROM, no deformity No snuffbox tenderness noted in either extremity   Full ROM of each hip without tenderness SKIN: warm, color normal PSYCH: no abnormalities of mood noted   ED Course  Procedures    Pt improved no distress, no other complaints at this time Stable for d/c D/w family, agreeable with plan  MDM  Nursing notes reviewed and considered in  documentation xrays reviewed and considered         Sharyon Cable, MD 06/13/11 2128

## 2011-06-13 NOTE — ED Notes (Signed)
Pt reports that she tripped over a rug and fell forward. Pt reports pain to her right knee, mid and lower back, and left rib area.

## 2011-12-09 ENCOUNTER — Encounter (HOSPITAL_COMMUNITY): Payer: Self-pay | Admitting: Pharmacy Technician

## 2011-12-16 NOTE — Patient Instructions (Signed)
Your procedure is scheduled on: 12/23/2011  Report to [facility] at  900       AM.  Call this number if you have problems the morning of surgery: [phone]   Do not eat food or drink liquids :After Midnight.      Take these medicines the morning of surgery with A SIP OF WATER:xanax,lorcet,carlodopa,zyrtec,prevacid,singulair,zoloft. Take nasonex and albuterol before you come.   Do not wear jewelry, make-up or nail polish.  Do not wear lotions, powders, or perfumes. You may wear deodorant.  Do not shave 48 hours prior to surgery.  Do not bring valuables to the hospital.  Contacts, dentures or bridgework may not be worn into surgery.  Leave suitcase in the car. After surgery it may be brought to your room.  For patients admitted to the hospital, checkout time is 11:00 AM the day of discharge.   Patients discharged the day of surgery will not be allowed to drive home.  :     Please read over the following fact sheets that you were given: Coughing and Deep Breathing, Surgical Site Infection Prevention, Anesthesia Post-op Instructions and Care and Recovery After Surgery    Cataract A cataract is a clouding of the lens of the eye. When a lens becomes cloudy, vision is reduced based on the degree and nature of the clouding. Many cataracts reduce vision to some degree. Some cataracts make people more near-sighted as they develop. Other cataracts increase glare. Cataracts that are ignored and become worse can sometimes look white. The white color can be seen through the pupil. CAUSES   Aging. However, cataracts may occur at any age, even in newborns.   Certain drugs.   Trauma to the eye.   Certain diseases such as diabetes.   Specific eye diseases such as chronic inflammation inside the eye or a sudden attack of a rare form of glaucoma.   Inherited or acquired medical problems.  SYMPTOMS   Gradual, progressive drop in vision in the affected eye.   Severe, rapid visual loss. This most  often happens when trauma is the cause.  DIAGNOSIS  To detect a cataract, an eye doctor examines the lens. Cataracts are best diagnosed with an exam of the eyes with the pupils enlarged (dilated) by drops.  TREATMENT  For an early cataract, vision may improve by using different eyeglasses or stronger lighting. If that does not help your vision, surgery is the only effective treatment. A cataract needs to be surgically removed when vision loss interferes with your everyday activities, such as driving, reading, or watching TV. A cataract may also have to be removed if it prevents examination or treatment of another eye problem. Surgery removes the cloudy lens and usually replaces it with a substitute lens (intraocular lens, IOL).  At a time when both you and your doctor agree, the cataract will be surgically removed. If you have cataracts in both eyes, only one is usually removed at a time. This allows the operated eye to heal and be out of danger from any possible problems after surgery (such as infection or poor wound healing). In rare cases, a cataract may be doing damage to your eye. In these cases, your caregiver may advise surgical removal right away. The vast majority of people who have cataract surgery have better vision afterward. HOME CARE INSTRUCTIONS  If you are not planning surgery, you may be asked to do the following:  Use different eyeglasses.   Use stronger or brighter lighting.  Ask your eye doctor about reducing your medicine dose or changing medicines if it is thought that a medicine caused your cataract. Changing medicines does not make the cataract go away on its own.   Become familiar with your surroundings. Poor vision can lead to injury. Avoid bumping into things on the affected side. You are at a higher risk for tripping or falling.   Exercise extreme care when driving or operating machinery.   Wear sunglasses if you are sensitive to bright light or experiencing problems  with glare.  SEEK IMMEDIATE MEDICAL CARE IF:   You have a worsening or sudden vision loss.   You notice redness, swelling, or increasing pain in the eye.   You have a fever.  Document Released: 05/27/2005 Document Revised: 05/16/2011 Document Reviewed: 01/18/2011 [facility] Patient Information 2012 [place].PATIENT INSTRUCTIONS POST-ANESTHESIA  IMMEDIATELY FOLLOWING SURGERY:  Do not drive or operate machinery for the first twenty four hours after surgery.  Do not make any important decisions for twenty four hours after surgery or while taking narcotic pain medications or sedatives.  If you develop intractable nausea and vomiting or a severe headache please notify your doctor immediately.  FOLLOW-UP:  Please make an appointment with your surgeon as instructed. You do not need to follow up with anesthesia unless specifically instructed to do so.  WOUND CARE INSTRUCTIONS (if applicable):  Keep a dry clean dressing on the anesthesia/puncture wound site if there is drainage.  Once the wound has quit draining you may leave it open to air.  Generally you should leave the bandage intact for twenty four hours unless there is drainage.  If the epidural site drains for more than 36-48 hours please call the anesthesia department.  QUESTIONS?:  Please feel free to call your physician or the hospital operator if you have any questions, and they will be happy to assist you.

## 2011-12-17 ENCOUNTER — Encounter (HOSPITAL_COMMUNITY): Payer: Self-pay

## 2011-12-17 ENCOUNTER — Encounter (HOSPITAL_COMMUNITY)
Admission: RE | Admit: 2011-12-17 | Discharge: 2011-12-17 | Disposition: A | Discharge status: Home / Self Care | Payer: Medicare Other | Source: Ambulatory Visit | Attending: Ophthalmology | Admitting: Ophthalmology

## 2011-12-17 HISTORY — DX: Other complications of anesthesia, initial encounter: T88.59XA

## 2011-12-17 HISTORY — DX: Pure hypercholesterolemia, unspecified: E78.00

## 2011-12-17 HISTORY — DX: Unspecified asthma, uncomplicated: J45.909

## 2011-12-17 HISTORY — DX: Unspecified osteoarthritis, unspecified site: M19.90

## 2011-12-17 HISTORY — DX: Anxiety disorder, unspecified: F41.9

## 2011-12-17 HISTORY — DX: Depression, unspecified: F32.A

## 2011-12-17 HISTORY — DX: Major depressive disorder, single episode, unspecified: F32.9

## 2011-12-17 HISTORY — DX: Adverse effect of unspecified anesthetic, initial encounter: T41.45XA

## 2011-12-17 HISTORY — DX: Gastro-esophageal reflux disease without esophagitis: K21.9

## 2011-12-17 LAB — BASIC METABOLIC PANEL
Chloride: 106 mEq/L (ref 96–112)
Creatinine, Ser: 0.82 mg/dL (ref 0.50–1.10)
GFR calc Af Amer: 79 mL/min — ABNORMAL LOW (ref >90)
Sodium: 140 mEq/L (ref 135–145)

## 2011-12-17 LAB — HEMOGLOBIN AND HEMATOCRIT, BLOOD
HCT: 40.9 % (ref 36.0–46.0)
Hemoglobin: 13.5 g/dL (ref 12.0–15.0)

## 2011-12-17 MED ORDER — CYCLOPENTOLATE-PHENYLEPHRINE 0.2-1 % OP SOLN
OPHTHALMIC | Status: AC
  Filled 2011-12-17: qty 2

## 2011-12-23 ENCOUNTER — Ambulatory Visit (HOSPITAL_COMMUNITY): Payer: Medicare Other | Admitting: Anesthesiology

## 2011-12-23 ENCOUNTER — Encounter (HOSPITAL_COMMUNITY): Payer: Self-pay | Admitting: Anesthesiology

## 2011-12-23 ENCOUNTER — Ambulatory Visit (HOSPITAL_COMMUNITY)
Admission: RE | Admit: 2011-12-23 | Discharge: 2011-12-23 | Disposition: A | Discharge status: Home / Self Care | Payer: Medicare Other | Source: Ambulatory Visit | Attending: Ophthalmology | Admitting: Ophthalmology

## 2011-12-23 ENCOUNTER — Encounter (HOSPITAL_COMMUNITY): Payer: Self-pay | Admitting: *Deleted

## 2011-12-23 ENCOUNTER — Encounter (HOSPITAL_COMMUNITY)
Admission: RE | Disposition: A | Discharge status: Home / Self Care | Payer: Self-pay | Source: Ambulatory Visit | Attending: Ophthalmology

## 2011-12-23 DIAGNOSIS — K219 Gastro-esophageal reflux disease without esophagitis: Secondary | ICD-10-CM | POA: Insufficient documentation

## 2011-12-23 DIAGNOSIS — H269 Unspecified cataract: Secondary | ICD-10-CM | POA: Insufficient documentation

## 2011-12-23 DIAGNOSIS — F329 Major depressive disorder, single episode, unspecified: Secondary | ICD-10-CM | POA: Insufficient documentation

## 2011-12-23 DIAGNOSIS — J45909 Unspecified asthma, uncomplicated: Secondary | ICD-10-CM | POA: Insufficient documentation

## 2011-12-23 DIAGNOSIS — F3289 Other specified depressive episodes: Secondary | ICD-10-CM | POA: Insufficient documentation

## 2011-12-23 DIAGNOSIS — F411 Generalized anxiety disorder: Secondary | ICD-10-CM | POA: Insufficient documentation

## 2011-12-23 HISTORY — PX: CATARACT EXTRACTION W/PHACO: SHX586

## 2011-12-23 SURGERY — PHACOEMULSIFICATION, CATARACT, WITH IOL INSERTION
Anesthesia: Monitor Anesthesia Care | Site: Eye | Laterality: Right | Wound class: Clean

## 2011-12-23 MED ORDER — TETRACAINE 0.5 % OP SOLN OPTIME - NO CHARGE
OPHTHALMIC | Status: DC | PRN
  Administered 2011-12-23: 1 [drp] via OPHTHALMIC

## 2011-12-23 MED ORDER — TETRACAINE HCL 0.5 % OP SOLN: 1.0000 [drp] | Freq: Once | OPHTHALMIC | Status: DC

## 2011-12-23 MED ORDER — LIDOCAINE HCL 3.5 % OP GEL
OPHTHALMIC | Status: AC
  Filled 2011-12-23: qty 5

## 2011-12-23 MED ORDER — KETOROLAC TROMETHAMINE 0.4 % OP SOLN - NO CHARGE
1.0000 [drp] | Freq: Once | OPHTHALMIC | Status: AC
  Administered 2011-12-23: 1 [drp] via OPHTHALMIC
  Filled 2011-12-23: qty 5

## 2011-12-23 MED ORDER — GATIFLOXACIN 0.5 % OP SOLN OPTIME - NO CHARGE
1.0000 [drp] | Freq: Once | OPHTHALMIC | Status: AC
  Administered 2011-12-23: 1 [drp] via OPHTHALMIC
  Filled 2011-12-23: qty 2.5

## 2011-12-23 MED ORDER — TETRACAINE HCL 0.5 % OP SOLN
OPHTHALMIC | Status: AC
  Filled 2011-12-23: qty 2

## 2011-12-23 MED ORDER — EPINEPHRINE HCL 1 MG/ML IJ SOLN
INTRAMUSCULAR | Status: AC
  Filled 2011-12-23: qty 1

## 2011-12-23 MED ORDER — NA HYALUR & NA CHOND-NA HYALUR 0.55-0.5 ML IO KIT
PACK | INTRAOCULAR | Status: DC | PRN
  Administered 2011-12-23: 1 via OPHTHALMIC

## 2011-12-23 MED ORDER — BSS IO SOLN
INTRAOCULAR | Status: DC | PRN
  Administered 2011-12-23: 15 mL via INTRAOCULAR

## 2011-12-23 MED ORDER — CYCLOPENTOLATE-PHENYLEPHRINE 0.2-1 % OP SOLN
1.0000 [drp] | Freq: Once | OPHTHALMIC | Status: AC
  Administered 2011-12-23: 1 [drp] via OPHTHALMIC

## 2011-12-23 MED ORDER — MIDAZOLAM HCL 2 MG/2ML IJ SOLN
INTRAMUSCULAR | Status: AC
  Filled 2011-12-23: qty 2

## 2011-12-23 MED ORDER — GATIFLOXACIN 0.5 % OP SOLN OPTIME - NO CHARGE
OPHTHALMIC | Status: DC | PRN
  Administered 2011-12-23: 1 [drp] via OPHTHALMIC

## 2011-12-23 MED ORDER — BSS IO SOLN
INTRAOCULAR | Status: DC | PRN
  Administered 2011-12-23: 11:00:00

## 2011-12-23 MED ORDER — LACTATED RINGERS IV SOLN
INTRAVENOUS | Status: DC | PRN
  Administered 2011-12-23: 10:00:00 via INTRAVENOUS

## 2011-12-23 MED ORDER — LIDOCAINE 3.5 % OP GEL OPTIME - NO CHARGE
OPHTHALMIC | Status: DC | PRN
  Administered 2011-12-23: 1 [drp] via OPHTHALMIC

## 2011-12-23 SURGICAL SUPPLY — 29 items
CAPSULAR TENSION RING-AMO (OPHTHALMIC RELATED) IMPLANT
CLOTH BEACON ORANGE TIMEOUT ST (SAFETY) ×1 IMPLANT
GLOVE BIO SURGEON STRL SZ7.5 (GLOVE) IMPLANT
GLOVE BIOGEL M 6.5 STRL (GLOVE) IMPLANT
GLOVE BIOGEL PI IND STRL 6.5 (GLOVE) IMPLANT
GLOVE BIOGEL PI IND STRL 7.0 (GLOVE) IMPLANT
GLOVE BIOGEL PI IND STRL 7.5 (GLOVE) IMPLANT
GLOVE BIOGEL PI INDICATOR 6.5 (GLOVE)
GLOVE BIOGEL PI INDICATOR 7.0 (GLOVE) ×1
GLOVE BIOGEL PI INDICATOR 7.5 (GLOVE) ×1
GLOVE ECLIPSE 6.5 STRL STRAW (GLOVE) IMPLANT
GLOVE ECLIPSE 7.5 STRL STRAW (GLOVE) IMPLANT
GLOVE EXAM NITRILE LRG STRL (GLOVE) IMPLANT
GLOVE EXAM NITRILE MD LF STRL (GLOVE) ×1 IMPLANT
GLOVE SKINSENSE NS SZ6.5 (GLOVE)
GLOVE SKINSENSE NS SZ7.0 (GLOVE)
GLOVE SKINSENSE STRL SZ6.5 (GLOVE) IMPLANT
GLOVE SKINSENSE STRL SZ7.0 (GLOVE) IMPLANT
INST SET CATARACT ~~LOC~~ (KITS) ×2 IMPLANT
KIT VITRECTOMY (OPHTHALMIC RELATED) IMPLANT
PAD ARMBOARD 7.5X6 YLW CONV (MISCELLANEOUS) ×1 IMPLANT
PROC W NO LENS (INTRAOCULAR LENS)
PROC W SPEC LENS (INTRAOCULAR LENS)
PROCESS W NO LENS (INTRAOCULAR LENS) IMPLANT
PROCESS W SPEC LENS (INTRAOCULAR LENS) IMPLANT
RING MALYGIN (MISCELLANEOUS) IMPLANT
SIGHTPATH CAT PROC W REG LENS (Ophthalmic Related) ×2 IMPLANT
VISCOELASTIC ADDITIONAL (OPHTHALMIC RELATED) IMPLANT
WATER STERILE IRR 250ML POUR (IV SOLUTION) ×1 IMPLANT

## 2011-12-23 NOTE — Anesthesia Preprocedure Evaluation (Signed)
Anesthesia Evaluation  Patient identified by MRN, date of birth, ID band Patient awake    Reviewed: Allergy & Precautions, H&P , NPO status , Patient's Chart, lab work & pertinent test results  History of Anesthesia Complications (+) PONV  Airway Mallampati: II      Dental  (+) Teeth Intact and Implants   Pulmonary asthma ,  breath sounds clear to auscultation        Cardiovascular negative cardio ROS  Rhythm:Regular     Neuro/Psych PSYCHIATRIC DISORDERS Anxiety Depression    GI/Hepatic GERD-  Medicated and Controlled,  Endo/Other    Renal/GU      Musculoskeletal   Abdominal   Peds  Hematology   Anesthesia Other Findings   Reproductive/Obstetrics                           Anesthesia Physical Anesthesia Plan  ASA: II  Anesthesia Plan: MAC   Post-op Pain Management:    Induction: Intravenous  Airway Management Planned: Nasal Cannula  Additional Equipment:   Intra-op Plan:   Post-operative Plan:   Informed Consent: I have reviewed the patients History and Physical, chart, labs and discussed the procedure including the risks, benefits and alternatives for the proposed anesthesia with the patient or authorized representative who has indicated his/her understanding and acceptance.     Plan Discussed with:   Anesthesia Plan Comments:         Anesthesia Quick Evaluation

## 2011-12-23 NOTE — Op Note (Signed)
See scanned op note dated today

## 2011-12-23 NOTE — Anesthesia Postprocedure Evaluation (Signed)
  Anesthesia Post-op Note  Patient: [name]  Procedure(s) Performed: Procedure(s) (LRB): CATARACT EXTRACTION PHACO AND INTRAOCULAR LENS PLACEMENT (IOC) (Right)  Patient Location:  Short Stay  Anesthesia Type: MAC  Level of Consciousness: awake  Airway and Oxygen Therapy: Patient Spontanous Breathing  Post-op Pain: none  Post-op Assessment: Post-op Vital signs reviewed, Patient's Cardiovascular Status Stable, Respiratory Function Stable, Patent Airway, No signs of Nausea or vomiting and Pain level controlled  Post-op Vital Signs: Reviewed and stable  Complications: No apparent anesthesia complications

## 2011-12-23 NOTE — Anesthesia Procedure Notes (Signed)
Procedure Name: MAC Date/Time: 12/23/2011 10:50 AM Performed by: Vista Deck Pre-anesthesia Checklist: Patient identified, Emergency Drugs available, Suction available, Timeout performed and Patient being monitored Patient Re-evaluated:Patient Re-evaluated prior to inductionOxygen Delivery Method: Nasal Cannula

## 2011-12-23 NOTE — H&P (Signed)
I have reviewed the pre printed H&P, the patient was re-examined, and I have identified no significant interval changes in the patient's medical condition.  There is no change in the plan of care since the history and physical of record. 

## 2011-12-23 NOTE — Transfer of Care (Signed)
Immediate Anesthesia Transfer of Care Note  Patient: [name]  Procedure(s) Performed: Procedure(s) (LRB): CATARACT EXTRACTION PHACO AND INTRAOCULAR LENS PLACEMENT (IOC) (Right)  Patient Location: Shortstay  Anesthesia Type: MAC  Level of Consciousness: awake  Airway & Oxygen Therapy: Patient Spontanous Breathing   Post-op Assessment: Report given to PACU RN, Post -op Vital signs reviewed and stable and Patient moving all extremities  Post vital signs: Reviewed and stable  Complications: No apparent anesthesia complications

## 2011-12-23 NOTE — Brief Op Note (Signed)
12/23/2011  11:41 AM  PATIENT:  [name]  78 y.o. female  PRE-OPERATIVE DIAGNOSIS:  nuclear cataract right eye  POST-OPERATIVE DIAGNOSIS:  nuclear cataract right eye  PROCEDURE:  Procedure(s): CATARACT EXTRACTION PHACO AND INTRAOCULAR LENS PLACEMENT ([place])  SURGEON:  Surgeon(s): Williams Che, MD  ASSISTANTS: Bonney Roussel, CST   ANESTHESIA STAFF: Vista Deck, CRNA - CRNA Lerry Liner, MD - Anesthesiologist  ANESTHESIA:   topical and MAC  REQUESTED LENS POWER: 16.5  LENS IMPLANT INFORMATION: Alcon [id number]  CUMULATIVE DISSIPATED ENERGY:5.16  INDICATIONS:see H&P  OP FINDINGS:mod dense NS  COMPLICATIONS:None  DICTATION #: see scanned Op Note  PLAN OF CARE: see d/c instructions  PATIENT DISPOSITION:  Short Stay

## 2011-12-24 ENCOUNTER — Encounter (HOSPITAL_COMMUNITY): Payer: Self-pay | Admitting: Ophthalmology

## 2012-10-08 ENCOUNTER — Ambulatory Visit: Payer: Self-pay | Admitting: Nurse Practitioner

## 2012-12-03 ENCOUNTER — Encounter: Payer: Self-pay | Admitting: Nurse Practitioner

## 2012-12-03 ENCOUNTER — Ambulatory Visit (INDEPENDENT_AMBULATORY_CARE_PROVIDER_SITE_OTHER): Payer: Medicare Other | Admitting: Nurse Practitioner

## 2012-12-03 VITALS — BP 116/70 | HR 81 | Ht 63.25 in | Wt 162.0 lb

## 2012-12-03 DIAGNOSIS — M4800 Spinal stenosis, site unspecified: Secondary | ICD-10-CM | POA: Insufficient documentation

## 2012-12-03 DIAGNOSIS — M479 Spondylosis, unspecified: Secondary | ICD-10-CM

## 2012-12-03 DIAGNOSIS — G20 Parkinson's disease: Secondary | ICD-10-CM

## 2012-12-03 MED ORDER — CARBIDOPA-LEVODOPA 25-100 MG PO TABS: ORAL_TABLET | ORAL | Status: DC

## 2012-12-03 NOTE — Progress Notes (Signed)
HPI: Patient returns for followup after last visit with Dr. Brett Fairy 04/22/2012. She has a history of Parkinson's disease . Denies tremor, Independent in all ADL's. She is followed by  Dr. Karie Kirks. Her husband has fronto-temporal Dementia and died in 10-10-22 of this year. He was in a skilled facility for 4 years. She has been off of her exercise routine since her husband was hospitalized in January 2014. She had been swimming and using a stationary bike. Her Mirapex was discontinued due to compulsive behaviors and she is on Sinemet 3 times daily with very inconsistent times . She denies any hallucinations, confusion, or other side effects. She has not had any recent falls she does not use an assistive device. She is driving without difficulty She denies freezing spells, difficulty turning over in bed, tremor.  Had  in March 2013 eye surgery, right side cataract - lens replacement wich corrected her astigmatism and allows her to see far, but she has trouble to get used to reading. She is off the  pain patch and uses Hydrocodone prn . No recent  epidural injections.  PD symptoms well controlled   ROS:  Fatigue, hearing loss, cough, muscle cramps, allergies  Physical Exam General: well developed, well nourished, seated, in no evident distress, mild decreased facial expression Head: head normocephalic and atraumatic. Oropharynx benign Neck: supple with no carotid  bruits Cardiovascular: regular rate and rhythm, no murmurs  Neurologic Exam Mental Status: Awake and fully alert. Oriented to place and time. Follows all commands. Speech and language normal.   Cranial Nerves:  Pupils equal, briskly reactive to light. Extraocular movements full without nystagmus. Visual fields full to confrontation. Hearing intact and symmetric to finger snap. Facial sensation intact. Face, tongue, palate move normally and symmetrically. Neck flexion and extension normal.Negative Myerson's sign.   Motor: Normal bulk and tone.  Normal strength in all tested extremity muscles.No focal weakness, no cogwheeling, no tremor Sensory.: intact to touch and pinprick and vibratory.  Coordination: Rapid alternating movements normal in all extremities. Finger-to-nose and heel-to-shin performed accurately bilaterally. Gait and Station: Arises from chair without use of arms no difficulty ambulated 210 feet in 45 seconds. Leans to the right when ambulating. No decreased arm swing. No assistive device       ASSESSMENT: History of Parkinson's disease doing well on Sinemet History of degenerative disc disease doing well with prn pain medication    PLAN: Take Sinemet 3 times a day before meals Continue home exercise program and water therapy Will renew medication Fall risk =11, be careful with ambulation Followup in 6 months Dennie Bible, GNP-BC APRN

## 2012-12-03 NOTE — Patient Instructions (Addendum)
Take Sinemet 3 times a day before meals Continue home exercise program and water therapy Will renew medication Followup in 6 months

## 2013-05-18 ENCOUNTER — Other Ambulatory Visit (HOSPITAL_COMMUNITY): Payer: Self-pay | Admitting: Family Medicine

## 2013-05-18 DIAGNOSIS — Z780 Asymptomatic menopausal state: Secondary | ICD-10-CM

## 2013-05-18 DIAGNOSIS — M1990 Unspecified osteoarthritis, unspecified site: Secondary | ICD-10-CM

## 2013-05-20 ENCOUNTER — Ambulatory Visit (HOSPITAL_COMMUNITY)
Admission: RE | Admit: 2013-05-20 | Discharge: 2013-05-20 | Disposition: A | Discharge status: Home / Self Care | Payer: Medicare Other | Source: Ambulatory Visit | Attending: Family Medicine | Admitting: Family Medicine

## 2013-05-20 DIAGNOSIS — M899 Disorder of bone, unspecified: Secondary | ICD-10-CM | POA: Insufficient documentation

## 2013-05-20 DIAGNOSIS — Z780 Asymptomatic menopausal state: Secondary | ICD-10-CM | POA: Insufficient documentation

## 2013-06-15 ENCOUNTER — Ambulatory Visit: Payer: Medicare Other | Admitting: Neurology

## 2013-07-01 ENCOUNTER — Other Ambulatory Visit: Payer: Self-pay | Admitting: Neurology

## 2013-09-16 ENCOUNTER — Ambulatory Visit: Payer: Medicare Other | Admitting: Neurology

## 2013-11-04 ENCOUNTER — Telehealth: Payer: Self-pay | Admitting: Neurology

## 2013-11-04 NOTE — Telephone Encounter (Signed)
Patient returning Dana's call regarding a sooner appointment with Dr. Edwena Felty nurse practitioner. Please return call and advise.

## 2013-11-05 ENCOUNTER — Encounter: Payer: Self-pay | Admitting: Adult Health

## 2013-11-05 ENCOUNTER — Ambulatory Visit (INDEPENDENT_AMBULATORY_CARE_PROVIDER_SITE_OTHER): Payer: Medicare Other | Admitting: Adult Health

## 2013-11-05 VITALS — BP 141/85 | HR 78 | Wt 163.0 lb

## 2013-11-05 DIAGNOSIS — M4120 Other idiopathic scoliosis, site unspecified: Secondary | ICD-10-CM

## 2013-11-05 DIAGNOSIS — G20 Parkinson's disease: Secondary | ICD-10-CM

## 2013-11-05 DIAGNOSIS — M419 Scoliosis, unspecified: Secondary | ICD-10-CM | POA: Insufficient documentation

## 2013-11-05 DIAGNOSIS — M4800 Spinal stenosis, site unspecified: Secondary | ICD-10-CM

## 2013-11-05 MED ORDER — CARBIDOPA-LEVODOPA 25-100 MG PO TABS: 1.0000 | ORAL_TABLET | Freq: Three times a day (TID) | ORAL | Status: DC

## 2013-11-05 NOTE — Patient Instructions (Signed)
Parkinson Disease Parkinson disease is a disorder of the brain and spinal cord (central nervous system). The person will slowly lose the ability to control his or her body movements. This happens due to:  Damaged nerve cells.  Low levels of a certain brain chemical. HOME CARE  Exercise often.  Make time to rest during the day.  Take all medicine as told by your doctor.  Replace buttons and zippers with elastic and Velcro if getting dressed is difficult.  Put grab bars or rails in your home. This helps you to not fall.  Go to speech therapy or therapy to help you with daily activities (occupational therapy). Do this as told by your doctor.  Keep all doctor visits as told. GET HELP IF:  Your medicine does not help your symptoms.  You fall.  You have trouble swallowing or choke on your food. MAKE SURE YOU:  Understand these instructions.  Will watch your condition.  Will get help right away if you are not doing well or get worse. Document Released: 08/19/2011 Document Revised: 09/21/2012 Document Reviewed: 08/19/2011 [facility] Patient Information 2014 [place].

## 2013-11-05 NOTE — Progress Notes (Signed)
I agree with the assessment and plan as directed by NP .The patient is known to me .   Callan Norden, MD  

## 2013-11-05 NOTE — Progress Notes (Signed)
PATIENT: [name] DOB: Nov 02, 1933  REASON FOR VISIT: follow up HISTORY FROM: patient  HISTORY OF PRESENT ILLNESS: [name] is a 80 year old female with a history of Parkinson's disease. She returns today for follow-up. Patient currently on Sinemet three times daily and is tolerating it well. Patient denies having tremors. Denies trouble walking or with balance but states that after a period of walking she does get tired and has to stop and rest. She states that this started several weeks ago. Denies having any falls. Denies trouble with getting out of bed or rolling over. No trouble with swallowing or choking. Patient states that her neck has been hurting due to the curvature of her spine. She feels that she is more hunched over than she used to be. Patient is able to complete all activities of daily living independently. No new medical issues since the last visit.   REVIEW OF SYSTEMS: Full 14 system review of systems performed and notable only for:  Constitutional: activity change  Eyes: eye itching  Ear/Nose/Throat: ringing in the ears Skin: N/A  Cardiovascular: N/A  Respiratory: N/A  Gastrointestinal: N/A  Genitourinary: N/A Hematology/Lymphatic: N/A  Endocrine: N/A Musculoskeletal: back and neck pain  Allergy/Immunology: env allergies Neurological: numbness Psychiatric: N/A Sleep: N/A   ALLERGIES: Allergies  Allergen Reactions  . Cephalosporins Rash  . Latex Rash    Breaks out then gets infected  . Lactose Intolerance (Gi) Nausea And Vomiting  . Meperidine And Related Nausea And Vomiting    Needed Phenergan to combat nausea.    HOME MEDICATIONS: Outpatient Prescriptions Prior to Visit  Medication Sig Dispense Refill  . acetaminophen (TYLENOL) 500 MG tablet Take 500 mg by mouth as needed. For pain      . albuterol (PROVENTIL HFA;VENTOLIN HFA) 108 (90 BASE) MCG/ACT inhaler Inhale 2 puffs into the lungs every 6 (six) hours as needed. For shortness of breath       . ALPRAZolam (XANAX) 1 MG tablet Take 0.5 mg by mouth at bedtime as needed. For sleep       . aspirin EC 81 MG tablet Take 81 mg by mouth daily.      . Biotin 10 MG CAPS Take 1 capsule by mouth as needed.       . carbidopa-levodopa (SINEMET IR) 25-100 MG per tablet Take 1 tablet by mouth 3 (three) times daily. Before meals  270 tablet  1  . cetirizine-pseudoephedrine (ZYRTEC-D) 5-120 MG per tablet Take 1 tablet by mouth as needed. For sinus       . HYDROcodone-acetaminophen (NORCO) 10-325 MG per tablet       . ibuprofen (ADVIL,MOTRIN) 200 MG tablet Take 200 mg by mouth every 6 (six) hours as needed. For pain      . lansoprazole (PREVACID) 30 MG capsule Take 30 mg by mouth daily.      . mometasone (NASONEX) 50 MCG/ACT nasal spray Place 2 sprays into the nose 2 (two) times daily as needed. For allergies      . montelukast (SINGULAIR) 10 MG tablet Take 10 mg by mouth at bedtime.        . naproxen (NAPROSYN) 500 MG tablet Take 500 mg by mouth daily. For pain      . potassium chloride (K-DUR,KLOR-CON) 10 MEQ tablet Take 20 mEq by mouth daily. With Lasix       . sertraline (ZOLOFT) 100 MG tablet Take 100 mg by mouth every morning.        Marland Kitchen  torsemide (DEMADEX) 10 MG tablet Take 10 mg by mouth daily.      . vitamin B-12 (CYANOCOBALAMIN) 1000 MCG tablet Take 1,000 mcg by mouth 2 (two) times daily.       No facility-administered medications prior to visit.    PAST MEDICAL HISTORY: Past Medical History  Diagnosis Date  . Parkinson disease   . Back pain   . Seasonal allergies   . Complication of anesthesia   . Asthma   . GERD (gastroesophageal reflux disease)   . Anxiety   . Depression   . Hypercholesterolemia   . Arthritis     PAST SURGICAL HISTORY: Past Surgical History  Procedure Laterality Date  . Cholecystectomy    . Knee arthroscopy      right  . Abdominal hysterectomy    . Eye surgery      cataract extraction of left eye-[facility]-2002  . Nissen fundoplication      [facility]  .  Cataract extraction w/phaco  12/23/2011    Procedure: CATARACT EXTRACTION PHACO AND INTRAOCULAR LENS PLACEMENT (IOC);  Surgeon: Williams Che, MD;  Location: [facility];  Service: Ophthalmology;  Laterality: Right;  CDE=5.16    FAMILY HISTORY: History reviewed. No pertinent family history.  SOCIAL HISTORY: History   Social History  . Marital Status: Married    Spouse Name: N/A    Number of Children: N/A  . Years of Education: N/A   Occupational History  . Not on file.   Social History Main Topics  . Smoking status: Never Smoker   . Smokeless tobacco: Never Used  . Alcohol Use: 0.6 oz/week    1 Glasses of wine per week     Comment: occ  . Drug Use: No  . Sexual Activity: Yes    Birth Control/ Protection: Surgical   Other Topics Concern  . Not on file   Social History Narrative  . No narrative on file      PHYSICAL EXAM  Filed Vitals:   11/05/13 1143  BP: 141/85  Pulse: 78  Weight: 163 lb (73.936 kg)   Body mass index is 28.63 kg/(m^2).  Generalized: Well developed, in no acute distress   Neurological examination  Mentation: Alert oriented to time, place, history taking. Follows all commands speech and language fluent Cranial nerve II-XII:  Extraocular movements were full, visual field were full on confrontational test.  Motor: The motor testing reveals 5 over 5 strength of all 4 extremities. Good symmetric motor tone is noted throughout.  Sensory: Sensory testing is intact to soft touch on all 4 extremities. No evidence of extinction is noted.  Coordination: Cerebellar testing reveals good finger-nose-finger and heel-to-shin bilaterally. No tremor noted. Gait and station: patient is able to stand from a sitting position with her arms crossed. Gait is normal. Good arm swing. Tandem gait is unsteady.  Romberg is negative. No drift is seen.  Reflexes: Deep tendon reflexes are symmetric and normal bilaterally.    DIAGNOSTIC DATA (LABS, IMAGING, TESTING) - I  reviewed patient records, labs, notes, testing and imaging myself where available.  Lab Results  Component Value Date   WBC 8.4 12/02/2006   HGB 13.5 12/17/2011   HCT 40.9 12/17/2011   MCV 87.7 12/02/2006   PLT 336 12/02/2006      Component Value Date/Time   NA 140 12/17/2011 1129   K 5.0 12/17/2011 1129   CL 106 12/17/2011 1129   CO2 24 12/17/2011 1129   GLUCOSE 90 12/17/2011 1129   BUN 13  12/17/2011 1129   CREATININE 0.82 12/17/2011 1129   CALCIUM 10.0 12/17/2011 1129   GFRNONAA 68* 12/17/2011 1129   GFRAA 79* 12/17/2011 1129     ASSESSMENT AND PLAN 80 y.o. year old female  has a past medical history of Parkinson disease; Back pain; Seasonal allergies; Complication of anesthesia; Asthma; GERD (gastroesophageal reflux disease); Anxiety; Depression; Hypercholesterolemia; and Arthritis. here with: 1. Parkinson disease 2. Spinal stenosis, unspecified region other than cervical 3. Scoliosis of thoracic spine  Patient has remained stable on sinemet. Denies having a tremor since starting the medication. Will refill today.  Patient stated that physical therapy helped her before when she was having back pain due to scoliosis. I will make a referral today. It may also help increase her stamina when walking and completing activities. If physical therapy is not beneficial she should follow up with Dr. Ellene Route. In the past he has given her injections in her back to help with discomfort.   Patient should follow up in 1 year or sooner if needed.   Ward Givens, MSN, NP-C 11/05/2013, 11:45 AM [facility] [address], [place], [place] [address] [phone]  Note: This document was prepared with digital dictation and possible smart phrase technology. Any transcriptional errors that result from this process are unintentional.

## 2013-12-30 ENCOUNTER — Telehealth: Payer: Self-pay | Admitting: Neurology

## 2013-12-30 NOTE — Telephone Encounter (Signed)
Patient calling about therapy referral -s he wishes to have it in [place] as it is closer to her home. Please call to advise

## 2013-12-30 NOTE — Telephone Encounter (Signed)
Pt's information was given to [facility], CMA, please advise.

## 2014-01-04 NOTE — Telephone Encounter (Signed)
Referral was corrected and sent to [facility]

## 2014-01-06 ENCOUNTER — Other Ambulatory Visit: Payer: Self-pay | Admitting: Neurology

## 2014-02-21 ENCOUNTER — Telehealth: Payer: Self-pay | Admitting: *Deleted

## 2014-02-21 NOTE — Telephone Encounter (Signed)
Patient saw Ward Givens in 10/2013 and states to followup in 1 year or sooner if needed.  Patient is on Dr. Brett Fairy wait list and has an appointment with Dr. Brett Fairy in October, need to find out if that is needed.

## 2014-02-21 NOTE — Telephone Encounter (Signed)
Noted  

## 2014-02-21 NOTE — Telephone Encounter (Signed)
Pt requesting to see Dr Brett Fairy, since last seen with her over a yr ago, and is having worsening PD sx. Confirmed appt.

## 2014-03-30 ENCOUNTER — Ambulatory Visit (INDEPENDENT_AMBULATORY_CARE_PROVIDER_SITE_OTHER): Payer: Medicare Other | Admitting: Neurology

## 2014-03-30 ENCOUNTER — Other Ambulatory Visit: Payer: Self-pay | Admitting: Neurology

## 2014-03-30 ENCOUNTER — Encounter (INDEPENDENT_AMBULATORY_CARE_PROVIDER_SITE_OTHER): Payer: Self-pay

## 2014-03-30 ENCOUNTER — Encounter: Payer: Self-pay | Admitting: Neurology

## 2014-03-30 VITALS — BP 155/86 | HR 93 | Temp 97.1°F | Resp 18 | Ht 63.0 in | Wt 172.0 lb

## 2014-03-30 DIAGNOSIS — G252 Other specified forms of tremor: Secondary | ICD-10-CM

## 2014-03-30 DIAGNOSIS — R0683 Snoring: Secondary | ICD-10-CM

## 2014-03-30 DIAGNOSIS — M5137 Other intervertebral disc degeneration, lumbosacral region: Secondary | ICD-10-CM

## 2014-03-30 DIAGNOSIS — G20 Parkinson's disease: Secondary | ICD-10-CM

## 2014-03-30 DIAGNOSIS — R259 Unspecified abnormal involuntary movements: Principal | ICD-10-CM

## 2014-03-30 DIAGNOSIS — R258 Other abnormal involuntary movements: Secondary | ICD-10-CM

## 2014-03-30 MED ORDER — ALPRAZOLAM 1 MG PO TABS: ORAL_TABLET | ORAL | Status: DC

## 2014-03-30 MED ORDER — CARBIDOPA-LEVODOPA 25-100 MG PO TABS: 1.0000 | ORAL_TABLET | Freq: Three times a day (TID) | ORAL | Status: DC

## 2014-03-30 NOTE — Progress Notes (Signed)
Provider:  Larey Seat, M D  Referring Provider: Robert Bellow, MD Primary Care Physician:  Robert Bellow, MD  Chief Complaint  Patient presents with  . RV PD    Rm 11,  alone    HPI:  [name] is a 80 y.o. female  Is seen here as a revisit  from Dr. Karie Kirks for parkinsonism and apnea evaluation/ follow up.  She remains on sinemet and has followed Cecille Rubin, NP until I had to cancel a clinic of mine and the patient had to see another NP.  She is alternating every 6 month between NP and me . She has begun walking with a cane - this is due to DDD, not PD. [name] noted that she has begun to store fluid. Her ankles are a puffy, she has a higher respiratory rate.  She is easily fatigued and has chronic back pain. Urinary incontinence and hypothyroidism,  Neck pain and snoring, some depression.  During a recent trip to her download witnessed  her snoring, but did not tell her about any apnea been witnessed.    The patient to to back pain there is ibuprofen after naproxen was too hard on her stomach.  She takes allergy medicines, vitamin B12, Prevacid, Nasonex, baby aspirin, Proventil inhaler as needed.    Review of Systems: Out of a complete 14 system review, the patient complains of only the following symptoms, and all other reviewed systems are negative.  The patient and endorsed activity changes,  she is easier fatigued,  she has excessive thirst , eye itching and  oughing due to allergies.  Now she knows that she is snoring,  She has some back pain and neck pain bladder incontinence, hearing loss or ringing in her ear - headaches and depression.   History   Social History  . Marital Status: Married    Spouse Name: N/A    Number of Children: N/A  . Years of Education: N/A   Occupational History  . Not on file.   Social History Main Topics  . Smoking status: Never Smoker   . Smokeless tobacco: Never Used  . Alcohol Use: No   Comment: occ  . Drug Use: No  . Sexual Activity: Yes    Birth Control/ Protection: Surgical   Other Topics Concern  . Not on file   Social History Narrative  . No narrative on file    Family History  Problem Relation Age of Onset  . Stroke Mother   . COPD Father   . Stroke Father     Past Medical History  Diagnosis Date  . Parkinson disease   . Back pain   . Seasonal allergies   . Complication of anesthesia   . Asthma   . GERD (gastroesophageal reflux disease)   . Anxiety   . Depression   . Hypercholesterolemia   . Arthritis   . Hypothyroid     Past Surgical History  Procedure Laterality Date  . Cholecystectomy    . Knee arthroscopy      right  . Abdominal hysterectomy    . Eye surgery      cataract extraction of left eye-[facility]-2002  . Nissen fundoplication      [facility]  . Cataract extraction w/phaco  12/23/2011    Procedure: CATARACT EXTRACTION PHACO AND INTRAOCULAR LENS PLACEMENT (IOC);  Surgeon: Williams Che, MD;  Location: [facility];  Service: Ophthalmology;  Laterality: Right;  CDE=5.16    Current  Outpatient Prescriptions  Medication Sig Dispense Refill  . acetaminophen (TYLENOL) 500 MG tablet Take 500 mg by mouth as needed. For pain      . albuterol (PROVENTIL HFA;VENTOLIN HFA) 108 (90 BASE) MCG/ACT inhaler Inhale 2 puffs into the lungs every 6 (six) hours as needed. For shortness of breath      . ALPRAZolam (XANAX) 1 MG tablet Take 0.5 mg by mouth at bedtime as needed. For sleep       . aspirin EC 81 MG tablet Take 81 mg by mouth 2 (two) times a week.       . Biotin 10 MG CAPS Take 1 capsule by mouth daily.       . carbidopa-levodopa (SINEMET IR) 25-100 MG per tablet Take 1 tablet by mouth 3 (three) times daily. Before meals  270 tablet  3  . cetirizine-pseudoephedrine (ZYRTEC-D) 5-120 MG per tablet Take 1 tablet by mouth as needed. For sinus       . HYDROcodone-acetaminophen (NORCO) 10-325 MG per tablet       . ibuprofen (ADVIL,MOTRIN) 200 MG tablet Take  200 mg by mouth every 6 (six) hours as needed. For pain      . lansoprazole (PREVACID) 30 MG capsule Take 30 mg by mouth daily.      Marland Kitchen levothyroxine (SYNTHROID, LEVOTHROID) 25 MCG tablet Take 25 mcg by mouth daily.      . mometasone (NASONEX) 50 MCG/ACT nasal spray Place 2 sprays into the nose 2 (two) times daily as needed. For allergies      . montelukast (SINGULAIR) 10 MG tablet Take 10 mg by mouth at bedtime.        . naproxen (NAPROSYN) 500 MG tablet Take 500 mg by mouth daily. For pain      . potassium chloride (K-DUR,KLOR-CON) 10 MEQ tablet Take 20 mEq by mouth daily. With Lasix       . potassium chloride (MICRO-K) 10 MEQ CR capsule Take 10 mEq by mouth daily.      . sertraline (ZOLOFT) 100 MG tablet Take 100 mg by mouth every morning.        . torsemide (DEMADEX) 10 MG tablet Take 10 mg by mouth daily. 1/2 to 1 tab daily      . vitamin B-12 (CYANOCOBALAMIN) 1000 MCG tablet Take 1,000 mcg by mouth every other day.       . halobetasol (ULTRAVATE) 0.05 % cream        No current facility-administered medications for this visit.    Allergies as of 03/30/2014 - Review Complete 03/30/2014  Allergen Reaction Noted  . Cephalosporins Rash 10/19/2010  . Latex Rash 01/17/2011  . Lactose intolerance (gi) Nausea And Vomiting 12/09/2011  . Meperidine and related Nausea And Vomiting 10/19/2010    Vitals: BP 155/86  Pulse 93  Temp(Src) 97.1 F (36.2 C) (Oral)  Resp 18  Ht 5' 3"  (1.6 m)  Wt 172 lb (78.019 kg)  BMI 30.48 kg/m2 Last Weight:  Wt Readings from Last 1 Encounters:  03/30/14 172 lb (78.019 kg)   Last Height:   Ht Readings from Last 1 Encounters:  03/30/14 5' 3"  (1.6 m)    Physical exam:  General: The patient is awake, alert and appears not in acute distress. The patient is well groomed. Head: Normocephalic, atraumatic. Neck is supple. Mallampati 3 , neck circumference: 15.5  No TMJ,  Cardiovascular:  Regular rate and rhythm, without  murmurs or carotid bruit, and without  distended neck veins. Respiratory: Lungs are  clear to auscultation. She is easily short of breath while speaking.  Skin:  Without evidence of edema, or rash Trunk: BMI iselevated and patient  has normal posture.  Neurologic exam : The patient is awake and alert, oriented to place and time.  Memory subjective  described as intact.  There is a normal attention span & concentration ability. Speech is fluent without  dysarthria, dysphonia or aphasia. Mood and affect are appropriate.  Cranial nerves: Pupils are equal and briskly reactive to light. Funduscopic exam without  evidence of pallor or edema.  Extraocular movements  in vertical and horizontal planes intact and without nystagmus.   No restriction of eye movement, patient is status post cataract surgery.  No masked face.  No dysphagia, no REM behavior, no RLS.   Visual fields by finger perimetry are intact. Hearing to finger rub intact.  Facial sensation intact to fine touch. Facial motor strength is symmetric and tongue and uvula move midline. Tongue protrusion into either cheek is normal. Shoulder shrug is normal.   Motor exam:   Coordination: Rapid alternating movements in the fingers/hands were normal.  Finger-to-nose maneuver  normal without evidence of ataxia, dysmetria but Tremor.  Her left arm is often  numb, she has cog-wheeling on the left biceps , more rigidity, she had pill rolling tremor before sinemet .   Gait and station: Patient walks slowly and slightly shuffling Gait- "she throws" one leg , not using any without assistive device and is able unassisted to climb up to the exam table.  Strength within normal limits. Stance is stable and normal.  She managed the step stool. She turned.     Assessment patient with parkinsonism, cogwheeling or the left biceps more than right pill rolling tremor left more than right and a baseline resting tremor. The patient reports that when she forgot her doors she does notice a difference  in her mobility and rigidity. Tremor is more dominant than. She does not have dysarthria I see no dysphonia and her face is not mild. She is a slightly shuffling gait. Cognitively no impairment. This is unusual for a true parkinson's disease patient. She reports sleeping in one position without ever moving.  She suffers more from stiffness, DDD and arthritis, She is feeling stiff in the morning , the first moves are difficult. Her sleep intiation is delayed . She has rare nocturia-  She wears a pad at night, but never had accidents in her sleep, but frequent urination in daytime.   She uses a diuretic in AM.  She is not refreshed , restored. She wakes with headaches.  Snoring recently observed, will order ONO to screen-  as needed followed by  PSG.   RV with NP in 30 days.

## 2014-03-30 NOTE — Patient Instructions (Signed)
  Overnight pulse oximetry for apnea screening.  Sinemet unchanged- refilled.   PT for back pain exercises suggested.

## 2014-04-27 ENCOUNTER — Encounter: Payer: Self-pay | Admitting: Neurology

## 2014-05-03 ENCOUNTER — Encounter: Payer: Self-pay | Admitting: Neurology

## 2014-09-06 ENCOUNTER — Other Ambulatory Visit (HOSPITAL_COMMUNITY): Payer: Self-pay | Admitting: Family Medicine

## 2014-09-06 ENCOUNTER — Ambulatory Visit (HOSPITAL_COMMUNITY)
Admission: RE | Admit: 2014-09-06 | Discharge: 2014-09-06 | Disposition: A | Discharge status: Home / Self Care | Payer: Medicare Other | Source: Ambulatory Visit | Attending: Family Medicine | Admitting: Family Medicine

## 2014-09-06 DIAGNOSIS — J209 Acute bronchitis, unspecified: Secondary | ICD-10-CM

## 2014-09-06 DIAGNOSIS — R0602 Shortness of breath: Secondary | ICD-10-CM | POA: Insufficient documentation

## 2014-09-06 DIAGNOSIS — R509 Fever, unspecified: Secondary | ICD-10-CM | POA: Insufficient documentation

## 2014-09-06 DIAGNOSIS — R05 Cough: Secondary | ICD-10-CM | POA: Insufficient documentation

## 2014-09-06 DIAGNOSIS — R9389 Abnormal findings on diagnostic imaging of other specified body structures: Secondary | ICD-10-CM

## 2014-09-15 ENCOUNTER — Ambulatory Visit (INDEPENDENT_AMBULATORY_CARE_PROVIDER_SITE_OTHER): Payer: Medicare Other | Admitting: Otolaryngology

## 2014-09-15 DIAGNOSIS — H903 Sensorineural hearing loss, bilateral: Secondary | ICD-10-CM | POA: Diagnosis not present

## 2014-09-15 DIAGNOSIS — H9313 Tinnitus, bilateral: Secondary | ICD-10-CM

## 2014-09-19 ENCOUNTER — Encounter (HOSPITAL_COMMUNITY): Payer: Self-pay

## 2014-09-19 ENCOUNTER — Ambulatory Visit (HOSPITAL_COMMUNITY)
Admission: RE | Admit: 2014-09-19 | Discharge: 2014-09-19 | Disposition: A | Discharge status: Home / Self Care | Payer: Medicare Other | Source: Ambulatory Visit | Attending: Family Medicine | Admitting: Family Medicine

## 2014-09-19 DIAGNOSIS — R9389 Abnormal findings on diagnostic imaging of other specified body structures: Secondary | ICD-10-CM

## 2014-09-19 DIAGNOSIS — R938 Abnormal findings on diagnostic imaging of other specified body structures: Secondary | ICD-10-CM | POA: Insufficient documentation

## 2014-09-19 DIAGNOSIS — R05 Cough: Secondary | ICD-10-CM | POA: Diagnosis present

## 2014-09-19 LAB — POCT I-STAT CREATININE: CREATININE: 1 mg/dL (ref 0.50–1.10)

## 2014-09-19 MED ORDER — IOHEXOL 300 MG/ML  SOLN
80.0000 mL | Freq: Once | INTRAMUSCULAR | Status: AC | PRN
  Administered 2014-09-19: 80 mL via INTRAVENOUS

## 2014-11-08 ENCOUNTER — Ambulatory Visit: Payer: Medicare Other | Admitting: Adult Health

## 2014-12-09 ENCOUNTER — Ambulatory Visit: Payer: Medicare Other | Admitting: Adult Health

## 2014-12-13 ENCOUNTER — Ambulatory Visit (INDEPENDENT_AMBULATORY_CARE_PROVIDER_SITE_OTHER): Payer: Medicare Other | Admitting: Adult Health

## 2014-12-13 ENCOUNTER — Encounter: Payer: Self-pay | Admitting: Adult Health

## 2014-12-13 VITALS — BP 126/79 | HR 85 | Ht 63.0 in | Wt 164.0 lb

## 2014-12-13 DIAGNOSIS — G252 Other specified forms of tremor: Secondary | ICD-10-CM

## 2014-12-13 DIAGNOSIS — R258 Other abnormal involuntary movements: Secondary | ICD-10-CM

## 2014-12-13 DIAGNOSIS — G479 Sleep disorder, unspecified: Secondary | ICD-10-CM

## 2014-12-13 DIAGNOSIS — R259 Unspecified abnormal involuntary movements: Principal | ICD-10-CM

## 2014-12-13 MED ORDER — ALPRAZOLAM 1 MG PO TABS: ORAL_TABLET | ORAL | Status: DC

## 2014-12-13 NOTE — Progress Notes (Signed)
PATIENT: [name] DOB: 24-Jun-1933  REASON FOR VISIT: follow up- resting tremor HISTORY FROM: patient  HISTORY OF PRESENT ILLNESS: [name] is a 81 year old female with a history of resting tremor referred by Dr. Karie Kirks. She returns today for follow-up. The patient continues to take Sinemet 3 times a day. She reports that her resting tremor has improved. Patient denies any changes with her walking or balance. Denies any falls since the last visit. Denies any trouble swallowing. She does state that she has a goiter. Patient does have trouble falling asleep. She has been prescribed Xanax in the past to help with this. She states that she will take this occasionally but she tries not to take it consistently so that she doesn't become dependent on the medication. The patient states that she recently saw Dr. Karie Kirks and he decreased her Zoloft to half a tablet a day. She states that so far she is tolerating this well. She states that if she starts to feel emotionally or "Antsy" she will take the other half. Overall she feels that she is doing well. Returns today for an evaluation.  REVIEW OF SYSTEMS: Out of a complete 14 system review of symptoms, the patient complains only of the following symptoms, and all other reviewed systems are negative.  Eye itching, hearing loss, ringing in ears, cough, environmental allergies, frequent infections, incontinence of bladder, urgency, joint pain, joint swelling, back pain, neck stiffness, insomnia, numbness  ALLERGIES: Allergies  Allergen Reactions  . Cephalosporins Rash  . Latex Rash    Breaks out then gets infected  . Lactose Intolerance (Gi) Nausea And Vomiting  . Meperidine And Related Nausea And Vomiting    Needed Phenergan to combat nausea.    HOME MEDICATIONS: Outpatient Prescriptions Prior to Visit  Medication Sig Dispense Refill  . acetaminophen (TYLENOL) 500 MG tablet Take 500 mg by mouth as needed. For pain    . albuterol (PROVENTIL  HFA;VENTOLIN HFA) 108 (90 BASE) MCG/ACT inhaler Inhale 2 puffs into the lungs every 6 (six) hours as needed. For shortness of breath    . ALPRAZolam (XANAX) 1 MG tablet For sleep as needed. 30 tablet 1  . aspirin EC 81 MG tablet Take 81 mg by mouth 2 (two) times a week.     . Biotin 10 MG CAPS Take 1 capsule by mouth daily.     . carbidopa-levodopa (SINEMET IR) 25-100 MG per tablet Take 1 tablet by mouth 3 (three) times daily. Before meals 270 tablet 3  . cetirizine-pseudoephedrine (ZYRTEC-D) 5-120 MG per tablet Take 1 tablet by mouth as needed. For sinus     . halobetasol (ULTRAVATE) 0.05 % cream     . HYDROcodone-acetaminophen (NORCO) 10-325 MG per tablet     . ibuprofen (ADVIL,MOTRIN) 200 MG tablet Take 200 mg by mouth every 6 (six) hours as needed. For pain    . lansoprazole (PREVACID) 30 MG capsule Take 30 mg by mouth daily.    Marland Kitchen levothyroxine (SYNTHROID, LEVOTHROID) 25 MCG tablet Take 25 mcg by mouth daily.    . mometasone (NASONEX) 50 MCG/ACT nasal spray Place 2 sprays into the nose 2 (two) times daily as needed. For allergies    . montelukast (SINGULAIR) 10 MG tablet Take 10 mg by mouth at bedtime.      . naproxen (NAPROSYN) 500 MG tablet Take 500 mg by mouth daily. For pain    . potassium chloride (K-DUR,KLOR-CON) 10 MEQ tablet Take 20 mEq by mouth daily. With Lasix     .  potassium chloride (MICRO-K) 10 MEQ CR capsule Take 10 mEq by mouth daily.    . sertraline (ZOLOFT) 100 MG tablet Take 100 mg by mouth every morning.      . torsemide (DEMADEX) 10 MG tablet Take 10 mg by mouth daily. 1/2 to 1 tab daily    . vitamin B-12 (CYANOCOBALAMIN) 1000 MCG tablet Take 1,000 mcg by mouth every other day.      No facility-administered medications prior to visit.    PAST MEDICAL HISTORY: Past Medical History  Diagnosis Date  . Parkinson disease   . Back pain   . Seasonal allergies   . Complication of anesthesia   . Asthma   . GERD (gastroesophageal reflux disease)   . Anxiety   .  Depression   . Hypercholesterolemia   . Arthritis   . Hypothyroid     PAST SURGICAL HISTORY: Past Surgical History  Procedure Laterality Date  . Cholecystectomy    . Knee arthroscopy      right  . Abdominal hysterectomy    . Eye surgery      cataract extraction of left eye-[facility]-2002  . Nissen fundoplication      [facility]  . Cataract extraction w/phaco  12/23/2011    Procedure: CATARACT EXTRACTION PHACO AND INTRAOCULAR LENS PLACEMENT (IOC);  Surgeon: Williams Che, MD;  Location: [facility];  Service: Ophthalmology;  Laterality: Right;  CDE=5.16    FAMILY HISTORY: Family History  Problem Relation Age of Onset  . Stroke Mother   . COPD Father   . Stroke Father     SOCIAL HISTORY: History   Social History  . Marital Status: Married    Spouse Name: N/A  . Number of Children: 2  . Years of Education: N/A   Occupational History  .      retired   Social History Main Topics  . Smoking status: Never Smoker   . Smokeless tobacco: Never Used  . Alcohol Use: No     Comment: occ  . Drug Use: No  . Sexual Activity: Yes    Birth Control/ Protection: Surgical   Other Topics Concern  . Not on file   Social History Narrative   Patient lives at home alone and she is widowed.   Retired.    Education one year business course.   Right handed.   Caffeine two cup of coffee daily and one sweet tea.      PHYSICAL EXAM  Filed Vitals:   12/13/14 1546  Height: 5' 3"  (1.6 m)   There is no weight on file to calculate BMI. Generalized: Well developed, in no acute distress   Neurological examination  Mentation: Alert oriented to time, place, history taking. Follows all commands speech and language fluent Cranial nerve II-XII: Pupils were equal round reactive to light. Extraocular movements were full, visual field were full on confrontational test. Facial sensation and strength were normal. Uvula tongue midline. Head turning and shoulder shrug  were normal and symmetric. Motor: The  motor testing reveals 5 over 5 strength of all 4 extremities. Good symmetric motor tone is noted throughout.  Sensory: Sensory testing is intact to soft touch on all 4 extremities. No evidence of extinction is noted.  Coordination: Cerebellar testing reveals good finger-nose-finger and heel-to-shin bilaterally.  Gait and station: Gait is normal, patient has a slightly stooped posture, good arm swing and good stride. Tandem gait is normal. Romberg is negative. No drift is seen.  Reflexes: Deep tendon reflexes are symmetric and normal  bilaterally.     DIAGNOSTIC DATA (LABS, IMAGING, TESTING) - I reviewed patient records, labs, notes, testing and imaging myself where available.    ASSESSMENT AND PLAN 81 y.o. year old female  has a past medical history of Parkinson disease; Back pain; Seasonal allergies; Complication of anesthesia; Asthma; GERD (gastroesophageal reflux disease); Anxiety; Depression; Hypercholesterolemia; Arthritis; and Hypothyroid. here with:  1. Resting tremor 2. Trouble falling asleep  Overall the patient is doing well. She will continue to take Sinemet 3 times a day. Patient does report some trouble falling asleep at night. I have encouraged the patient to use the Xanax if necessary. She verbalized understanding. Patient advised that if her symptoms worsen or she develops new symptoms she should let us know. Otherwise she will follow-up in 6 months or sooner if needed     Ward Givens, MSN, NP-C 12/13/2014, 3:57 PM [facility] [address], [place], [place] [address] [phone]  Note: This document was prepared with digital dictation and possible smart phrase technology. Any transcriptional errors that result from this process are unintentional.

## 2014-12-13 NOTE — Patient Instructions (Signed)
Continue Sinemet for tremor Use Xanax if needed for sleep. If symptoms worsen please let us know.

## 2014-12-13 NOTE — Progress Notes (Signed)
I have read the note, and I agree with the clinical assessment and plan.  Cherry Wittwer KEITH   

## 2015-05-15 DIAGNOSIS — G5602 Carpal tunnel syndrome, left upper limb: Secondary | ICD-10-CM | POA: Insufficient documentation

## 2015-05-15 DIAGNOSIS — G5601 Carpal tunnel syndrome, right upper limb: Secondary | ICD-10-CM | POA: Insufficient documentation

## 2015-05-15 DIAGNOSIS — M19049 Primary osteoarthritis, unspecified hand: Secondary | ICD-10-CM | POA: Insufficient documentation

## 2015-06-14 ENCOUNTER — Other Ambulatory Visit: Payer: Self-pay | Admitting: Neurology

## 2015-06-15 ENCOUNTER — Ambulatory Visit (INDEPENDENT_AMBULATORY_CARE_PROVIDER_SITE_OTHER): Payer: Medicare Other | Admitting: Neurology

## 2015-06-15 ENCOUNTER — Ambulatory Visit: Payer: Medicare Other | Admitting: Adult Health

## 2015-06-15 ENCOUNTER — Encounter: Payer: Self-pay | Admitting: Neurology

## 2015-06-15 VITALS — BP 132/88 | HR 90 | Resp 20 | Ht 63.0 in | Wt 167.0 lb

## 2015-06-15 DIAGNOSIS — K2100 Gastro-esophageal reflux disease with esophagitis, without bleeding: Secondary | ICD-10-CM

## 2015-06-15 DIAGNOSIS — R258 Other abnormal involuntary movements: Secondary | ICD-10-CM | POA: Diagnosis not present

## 2015-06-15 DIAGNOSIS — R259 Unspecified abnormal involuntary movements: Principal | ICD-10-CM

## 2015-06-15 DIAGNOSIS — G250 Essential tremor: Secondary | ICD-10-CM | POA: Diagnosis not present

## 2015-06-15 DIAGNOSIS — G5603 Carpal tunnel syndrome, bilateral upper limbs: Secondary | ICD-10-CM

## 2015-06-15 DIAGNOSIS — G252 Other specified forms of tremor: Secondary | ICD-10-CM | POA: Insufficient documentation

## 2015-06-15 DIAGNOSIS — N951 Menopausal and female climacteric states: Secondary | ICD-10-CM | POA: Insufficient documentation

## 2015-06-15 MED ORDER — DESVENLAFAXINE SUCCINATE ER 50 MG PO TB24: 50.0000 mg | ORAL_TABLET | Freq: Every day | ORAL | Status: DC

## 2015-06-15 NOTE — Progress Notes (Signed)
PATIENT: [name] DOB: August 23, 1933  REASON FOR VISIT: follow up- resting tremor HISTORY FROM: patient  HISTORY OF PRESENT ILLNESS:  July 2016 ( MM)  [name] is a 82 year old female with a history of resting tremor referred by Dr. Karie Torres. She returns today for follow-up. The patient continues to take Sinemet 3 times a day. She reports that her resting tremor has improved. Patient denies any changes with her walking or balance. Denies any falls since the last visit. Denies any trouble swallowing. She does state that she has a goiter. Patient does have trouble falling asleep. She has been prescribed Xanax in the past to help with this. She states that she will take this occasionally but she tries not to take it consistently so that she doesn't become dependent on the medication. The patient states that she recently saw Dr. Karie Torres and he decreased her Zoloft to half a tablet a day. She states that so far she is tolerating this well. She states that if she starts to feel emotionally or "Antsy" she will take the other half. Overall she feels that she is doing well. Returns today for an evaluation.  06-15-15 Patient weaned completely off Hydrocodone and takes ibuprofen. She has arthritis in both hands. She has developed significant carpal tunnel in both hands with a shrinkage of the thenar eminence. The patient is right-handed and is significantly enough bothered by the carpal tunnel that I think she should be surgically evaluated.  She feels weaker than last year.  In addition her resting tremor appears to be well controlled on 3 times a day Sinemet. She does not have dysarthria or dysphonia, she does have a history of hiatal hernia and reflux. Her primary care physician, Dr. Karie Torres, has tried to reduce her off the Prilosec and also reduced her antidepressant Zoloft to 50% of the previous dose. Her daughter feels that she gets "snappy " in response and more "abrupt" which is usually not her  personality.  It may be that Zoloft is no longer working for her and that it would be time to look at anewer antidepressants. I suggested Pristiq at 50 mg daily. I am not sure her medicare plan will allow it. I like for her to take prilosec every other day.   REVIEW OF SYSTEMS: Out of a complete 14 system review of symptoms, the patient complains only of the following symptoms, and all other reviewed systems are negative.  Eye itching, hearing loss, ringing in ears, cough, environmental allergies, frequent infections, incontinence of bladder, urgency, joint pain, joint swelling, back pain, neck stiffness, insomnia, numbness  ALLERGIES: Allergies  Allergen Reactions  . Cephalosporins Rash  . Latex Rash    Breaks out then gets infected  . Lactose Intolerance (Gi) Nausea And Vomiting  . Lactulose Nausea And Vomiting  . Cephalexin Rash  . Meperidine And Related Nausea And Vomiting    Needed Phenergan to combat nausea.  . Omeprazole Rash    HOME MEDICATIONS: Outpatient Prescriptions Prior to Visit  Medication Sig Dispense Refill  . acetaminophen (TYLENOL) 500 MG tablet Take 500 mg by mouth as needed. For pain    . albuterol (PROVENTIL HFA;VENTOLIN HFA) 108 (90 BASE) MCG/ACT inhaler Inhale 2 puffs into the lungs every 6 (six) hours as needed. For shortness of breath    . ALPRAZolam (XANAX) 1 MG tablet For sleep as needed. 30 tablet 0  . aspirin EC 81 MG tablet Take 81 mg by mouth 2 (two) times a week.     Marland Kitchen  Biotin 10 MG CAPS Take 1 capsule by mouth daily.     . carbidopa-levodopa (SINEMET IR) 25-100 MG tablet TAKE 1 TABLET 3 TIMES A DAYBEFORE MEALS 270 tablet 0  . cetirizine-pseudoephedrine (ZYRTEC-D) 5-120 MG per tablet Take 1 tablet by mouth as needed. For sinus     . halobetasol (ULTRAVATE) 0.05 % cream     . HYDROcodone-acetaminophen (NORCO) 10-325 MG per tablet     . ibuprofen (ADVIL,MOTRIN) 200 MG tablet Take 200 mg by mouth every 6 (six) hours as needed. For pain    . lansoprazole  (PREVACID) 30 MG capsule Take 30 mg by mouth daily.    Marland Kitchen levothyroxine (SYNTHROID, LEVOTHROID) 25 MCG tablet Take 25 mcg by mouth daily.    . mometasone (NASONEX) 50 MCG/ACT nasal spray Place 2 sprays into the nose 2 (two) times daily as needed. For allergies    . montelukast (SINGULAIR) 10 MG tablet Take 10 mg by mouth at bedtime.      . naproxen (NAPROSYN) 500 MG tablet Take 500 mg by mouth daily. For pain    . potassium chloride (K-DUR,KLOR-CON) 10 MEQ tablet Take 20 mEq by mouth daily. With Lasix     . potassium chloride (MICRO-K) 10 MEQ CR capsule Take 10 mEq by mouth daily.    . sertraline (ZOLOFT) 100 MG tablet Take 100 mg by mouth every morning.      . torsemide (DEMADEX) 10 MG tablet Take 10 mg by mouth daily. 1/2 to 1 tab daily    . vitamin B-12 (CYANOCOBALAMIN) 1000 MCG tablet Take 1,000 mcg by mouth every other day.      No facility-administered medications prior to visit.    PAST MEDICAL HISTORY: Past Medical History  Diagnosis Date  . Parkinson disease ([place])   . Back pain   . Seasonal allergies   . Complication of anesthesia   . Asthma   . GERD (gastroesophageal reflux disease)   . Anxiety   . Depression   . Hypercholesterolemia   . Arthritis   . Hypothyroid     PAST SURGICAL HISTORY: Past Surgical History  Procedure Laterality Date  . Cholecystectomy    . Knee arthroscopy      right  . Abdominal hysterectomy    . Eye surgery      cataract extraction of left eye-[facility]-2002  . Nissen fundoplication      [facility]  . Cataract extraction w/phaco  12/23/2011    Procedure: CATARACT EXTRACTION PHACO AND INTRAOCULAR LENS PLACEMENT (IOC);  Surgeon: Williams Che, MD;  Location: [facility];  Service: Ophthalmology;  Laterality: Right;  CDE=5.16    FAMILY HISTORY: Family History  Problem Relation Age of Onset  . Stroke Mother   . COPD Father   . Stroke Father     SOCIAL HISTORY: Social History   Social History  . Marital Status: Married    Spouse Name: N/A  .  Number of Children: 2  . Years of Education: N/A   Occupational History  .      retired   Social History Main Topics  . Smoking status: Never Smoker   . Smokeless tobacco: Never Used  . Alcohol Use: No     Comment: occ  . Drug Use: No  . Sexual Activity: Yes    Birth Control/ Protection: Surgical   Other Topics Concern  . Not on file   Social History Narrative   Patient lives at home alone and she is widowed.   Retired.  Education one year business course.   Right handed.   Caffeine two cup of coffee daily and one sweet tea.      PHYSICAL EXAM  Filed Vitals:   06/15/15 1043  BP: 132/88  Pulse: 90  Resp: 20  Height: 5' 3"  (1.6 m)  Weight: 167 lb (75.751 kg)   Body mass index is 29.59 kg/(m^2). Generalized: Well developed, in no acute distress   Neurological examination  Mentation: Alert oriented to time, place, history taking. Follows all commands speech and language fluent Cranial nerve ;  No change in smell and taste. Pupils were equal round reactive to light. Extraocular movements were full, visual field were full on confrontational test. Facial sensation and strength were normal. Uvula tongue midline. Head turning and shoulder shrug  were normal and symmetric. Motor: The motor testing reveals 5 / 5 strength of all 4 extremities. Elevated motor tone is noted throughout. Cog-wheeling bilaterally. Strength loss bilaterally, Grip is weak.  Sensory: Sensory testing is intact to soft touch on all 4 extremities. No evidence of extinction is noted.  Coordination: Cerebellar testing reveals good finger-nose-finger Gait and station: Gait is normal, patient has a slightly stooped posture, good arm swing and good stride.  Tandem gait is normal. Romberg is negative. No drift is seen.  Reflexes: Deep tendon reflexes are symmetric and normal bilaterally.     DIAGNOSTIC DATA (LABS, IMAGING, TESTING) - I reviewed patient records, labs, notes, testing and imaging myself where  available.  ASSESSMENT AND PLAN 82 y.o. year old female  has a past medical history of Parkinson disease ([place]); Back pain; Seasonal allergies; Complication of anesthesia; Asthma; GERD (gastroesophageal reflux disease); Anxiety; Depression; Hypercholesterolemia; Arthritis; and Hypothyroid. here with:  1. Depression; It may be that Zoloft is no longer working for her and that it would be time to look at anewer antidepressants. I suggested Pristiq at 50 mg daily. I am not sure her medicare plan will allow it.  2. GERD;I like for her to take prilosec every other day.  3  [name]'s resting tremor is much improved on sinemet. She has titubation, she has good arm swing when walking and turns with 3-4 steps.  4. There is bilateral significant muscle atrophy in both  thenar eminences- the palm has completely changed. Would like the patient to undergo a carpal tunnel test and if possible a surgery at least for the right hand which is her dominant hand. Send for NCV and EMG to Dr Jaynee Eagles.  5. The patient feels overall weaker, but is walking well.    Overall the patient is doing well. She will continue to take Sinemet 3 times a day. Patient does report some trouble falling asleep at night. I prescribed Pristiq.   I have encouraged the patient to use the Xanax if necessary. EMG and NCV.  She verbalized understanding. Patient advised that if her symptoms worsen or she develops new symptoms she should let us know. Otherwise she will follow-up in 6 months or sooner if needed   [facility], MD    06/15/2015, 10:53 AM [facility] [address], [place], [place] [address] [phone]

## 2015-06-15 NOTE — Patient Instructions (Signed)
Desvenlafaxine extended-release tablets What is this medicine? DESVENLAFAXINE (des VEN la [employer]) is used to treat depression. This medicine may be used for other purposes; ask your health care provider or pharmacist if you have questions. What should I tell my health care provider before I take this medicine? They need to know if you have any of these conditions: -glaucoma -high blood pressure -kidney disease -liver disease -mania or bipolar disorder -suicidal thoughts or a previous suicide attempt -an unusual reaction to desvenlafaxine, venlafaxine, other medicines, foods, dyes, or preservatives -pregnant or trying to get pregnant -breast-feeding How should I use this medicine? Take this medicine by mouth with a drink of water. Do not crush, cut or chew. Follow the directions on the prescription label. Take your doses at regular intervals. Do not take your medicine more often than directed. Do not stop taking this medicine suddenly except upon the advice of your doctor. Stopping this medicine too quickly may cause serious side effects or your condition may worsen. Contact your pediatrician or health care professional regarding the use of this medicine in children. Special care may be needed. A special MedGuide will be given to you by the pharmacist with each prescription and refill. Be sure to read this information carefully each time. Overdosage: If you think you have taken too much of this medicine contact a poison control center or emergency room at once. NOTE: This medicine is only for you. Do not share this medicine with others. What if I miss a dose? If you miss a dose, take it as soon as you can. If it is almost time for your next dose, take only that dose. Do not take double or extra doses. What may interact with this medicine? Do not take this medicine with any of the following medications: -duloxetine -linezolid -MAOIs like Azilect, Carbex, Eldepryl, Marplan, Nardil, and  Parnate -methylene blue (injected into a vein) -venlafaxine This medicine may also interact with the following medications: -alcohol -amphetamine -aspirin and aspirin-like medicines -certain migraine headache medicines (almotriptan, eletriptan, frovatriptan, naratriptan, rizatriptan, sumatriptan, zolmitriptan) -dexfenfluramine or fenfluramine -dextroamphetamine -furazolidone -isoniazid -lithium -medicines for heart rhythm or blood pressure -medicines that treat or prevent blood clots like warfarin, enoxaparin, and dalteparin -methylphenidate -metoclopramide -NSAIDS, medicines for pain and inflammation, like ibuprofen or naproxen -pentazocine -phentermine -procarbazine -protriptyline -sibutramine -St. John's wort, Hypericum perforatum -tramadol -tryptophan -zolpidem This list may not describe all possible interactions. Give your health care provider a list of all the medicines, herbs, non-prescription drugs, or dietary supplements you use. Also tell them if you smoke, drink alcohol, or use illegal drugs. Some items may interact with your medicine. What should I watch for while using this medicine? Tell your doctor if your symptoms do not get better or if they get worse. Visit your doctor or health care professional for regular checks on your progress. Because it may take several weeks to see the full effects of this medicine, it is important to continue your treatment as prescribed by your doctor. Patients and their families should watch out for new or worsening thoughts of suicide or depression. Also watch out for sudden changes in feelings such as feeling anxious, agitated, panicky, irritable, hostile, aggressive, impulsive, severely restless, overly excited and hyperactive, or not being able to sleep. If this happens, especially at the beginning of treatment or after a change in dose, call your health care professional. This medicine can cause an increase in blood pressure. Check  with your doctor for instructions on monitoring your blood pressure  while taking this medicine. You may get drowsy or dizzy. Do not drive, use machinery, or do anything that needs mental alertness until you know how this medicine affects you. Do not stand or sit up quickly, especially if you are an older patient. This reduces the risk of dizzy or fainting spells. Alcohol may interfere with the effect of this medicine. Avoid alcoholic drinks. Your mouth may get dry. Chewing sugarless gum, sucking hard candy and drinking plenty of water will help. Contact your doctor if the problem does not go away or is severe. What side effects may I notice from receiving this medicine? Side effects that you should report to your doctor or health care professional as soon as possible: -allergic reactions like skin rash, itching or hives, swelling of the face, lips, or tongue -hallucination, loss of contact with reality -mania (over-active behavior) -increase in blood pressure -redness, blistering, peeling or loosening of the skin, including inside the mouth -seizures -sexual difficulties (abnormal ejaculation or orgasm) -unusual bleeding or bruising -vomiting Side effects that usually do not require medical attention (report to your doctor or health care professional if they continue or are bothersome): -constipation -difficulty sleeping -dizziness, drowsiness -dry mouth -increased sweating -loss of appetite -nausea -tremor This list may not describe all possible side effects. Call your doctor for medical advice about side effects. You may report side effects to FDA at 1-800-FDA-1088. Where should I keep my medicine? Keep out of the reach of children. Store at room temperature between 15 and 30 degrees C (59 and 86 degrees F). Throw away any unused medicine after the expiration date. NOTE: This sheet is a summary. It may not cover all possible information. If you have questions about this medicine, talk to  your doctor, pharmacist, or health care provider.    2016, [employer]. (2013-09-27 10:39:35)

## 2015-06-28 DIAGNOSIS — M65341 Trigger finger, right ring finger: Secondary | ICD-10-CM | POA: Insufficient documentation

## 2015-06-29 ENCOUNTER — Other Ambulatory Visit: Payer: Self-pay | Admitting: Orthopedic Surgery

## 2015-07-04 ENCOUNTER — Telehealth: Payer: Self-pay | Admitting: Neurology

## 2015-07-04 NOTE — Telephone Encounter (Signed)
Pt called said she started taking desvenlafaxine (PRISTIQ) 50 MG 24 hr tablet on 06/15/15. She said she started having nausea about 4 days ago and then her lips started peeling and looking puffy, and her tongue is red and raw. She said the nausea is intermittent.

## 2015-07-04 NOTE — Telephone Encounter (Signed)
Spoke to pt. She started taking the pristiq 50 mg on 06/15/15. Since then, she has been really bothered by intermittent nausea. Within the past 4 days, she has experienced peeling lips, and her tongue is red and raw. She is convinced that pristiq is causing these side effects and is wondering if Dr. Brett Fairy is ok with her stopping the medication or if she should wean slowly off of pristiq.  I advised pt that I would call her back with Dr. Edwena Felty recommendation. Pt verbalized understanding.

## 2015-07-05 NOTE — Telephone Encounter (Signed)
Patient can stop the medication, best to take it every other day dor 8 days , stop after that. CD

## 2015-07-05 NOTE — Telephone Encounter (Signed)
Spoke to pt. I advised her that Dr. Brett Fairy recommended stopping the pristiq after taking it every other day for 8 days. Pt verbalized understanding. Pt also asked me to cancel her NCV/EMG with Dr. Jannifer Franklin on 07/13/15 because she is going to have surgery on her carpal tunnel instead and see if that helps her hands. NCV/EMG appts were cancelled. Pt verbalized understanding.

## 2015-07-13 ENCOUNTER — Encounter: Payer: Medicare Other | Admitting: Neurology

## 2015-07-20 ENCOUNTER — Ambulatory Visit (HOSPITAL_BASED_OUTPATIENT_CLINIC_OR_DEPARTMENT_OTHER): Admission: RE | Admit: 2015-07-20 | Payer: Medicare Other | Source: Ambulatory Visit | Admitting: Orthopedic Surgery

## 2015-07-20 ENCOUNTER — Encounter (HOSPITAL_BASED_OUTPATIENT_CLINIC_OR_DEPARTMENT_OTHER): Admission: RE | Payer: Self-pay | Source: Ambulatory Visit

## 2015-07-20 SURGERY — CARPOMETACARPEL (CMC) SUSPENSION PLASTY
Anesthesia: Choice | Laterality: Right

## 2015-07-26 ENCOUNTER — Ambulatory Visit (INDEPENDENT_AMBULATORY_CARE_PROVIDER_SITE_OTHER): Payer: Medicare Other | Admitting: Neurology

## 2015-07-26 ENCOUNTER — Encounter: Payer: Self-pay | Admitting: Neurology

## 2015-07-26 ENCOUNTER — Telehealth: Payer: Self-pay

## 2015-07-26 VITALS — BP 130/82 | HR 88 | Resp 20 | Ht 63.0 in | Wt 164.0 lb

## 2015-07-26 DIAGNOSIS — E034 Atrophy of thyroid (acquired): Secondary | ICD-10-CM

## 2015-07-26 DIAGNOSIS — G252 Other specified forms of tremor: Secondary | ICD-10-CM

## 2015-07-26 DIAGNOSIS — G5603 Carpal tunnel syndrome, bilateral upper limbs: Secondary | ICD-10-CM

## 2015-07-26 DIAGNOSIS — E038 Other specified hypothyroidism: Secondary | ICD-10-CM | POA: Diagnosis not present

## 2015-07-26 DIAGNOSIS — R258 Other abnormal involuntary movements: Secondary | ICD-10-CM | POA: Diagnosis not present

## 2015-07-26 DIAGNOSIS — R259 Unspecified abnormal involuntary movements: Secondary | ICD-10-CM

## 2015-07-26 DIAGNOSIS — G609 Hereditary and idiopathic neuropathy, unspecified: Secondary | ICD-10-CM

## 2015-07-26 MED ORDER — ALPRAZOLAM 1 MG PO TABS: ORAL_TABLET | ORAL | Status: DC

## 2015-07-26 NOTE — Progress Notes (Signed)
PATIENT: [name] DOB: [birth year]  REASON FOR VISIT: follow up- resting tremor HISTORY FROM: patient  HISTORY OF PRESENT ILLNESS:  July 2016 ( MM)  [name] is a 82 year old female with a history of resting tremor referred by Dr. Karie Kirks. She returns today for follow-up. The patient continues to take Sinemet 3 times a day. She reports that her resting tremor has improved. Patient denies any changes with her walking or balance. Denies any falls since the last visit. Denies any trouble swallowing. She does state that she has a goiter. Patient does have trouble falling asleep. She has been prescribed Xanax in the past to help with this. She states that she will take this occasionally but she tries not to take it consistently so that she doesn't become dependent on the medication. The patient states that she recently saw Dr. Karie Kirks and he decreased her Zoloft to half a tablet a day. She states that so far she is tolerating this well. She states that if she starts to feel emotionally or "Antsy" she will take the other half. Overall she feels that she is doing well. Returns today for an evaluation.  06-15-15 Patient weaned completely off Hydrocodone and takes ibuprofen. She has arthritis in both hands. She has developed significant carpal tunnel in both hands with a shrinkage of the thenar eminence. The patient is right-handed and is significantly enough bothered by the carpal tunnel that I think she should be surgically evaluated.  She feels weaker than last year.  In addition her resting tremor appears to be well controlled on 3 times a day Sinemet. She does not have dysarthria or dysphonia, she does have a history of hiatal hernia and reflux. Her primary care physician, Dr. Karie Kirks, has tried to reduce her off the Prilosec and also reduced her antidepressant Zoloft to 50% of the previous dose. Her daughter feels that she gets "snappy " in response and more "abrupt" which is usually not her  personality.  It may be that Zoloft is no longer working for her and that it would be time to look at anewer antidepressants. I suggested Pristiq at 50 mg daily. I am not sure her medicare plan will allow it. I like for her to take prilosec every other day.   REVIEW OF SYSTEMS: Out of a complete 14 system review of symptoms, the patient complains only of the following symptoms, and all other reviewed systems are negative.  Eye itching, hearing loss, ringing in ears, cough, environmental allergies, frequent infections, incontinence of bladder, urgency, joint pain, joint swelling, back pain, neck stiffness, insomnia, numbness  ALLERGIES: Allergies  Allergen Reactions  . Cephalosporins Rash  . Latex Rash    Breaks out then gets infected  . Lactose Intolerance (Gi) Nausea And Vomiting  . Lactulose Nausea And Vomiting  . Cephalexin Rash  . Meperidine And Related Nausea And Vomiting    Needed Phenergan to combat nausea.  . Omeprazole Rash    HOME MEDICATIONS: Outpatient Prescriptions Prior to Visit  Medication Sig Dispense Refill  . acetaminophen (TYLENOL) 500 MG tablet Take 500 mg by mouth as needed. For pain    . albuterol (PROVENTIL HFA;VENTOLIN HFA) 108 (90 BASE) MCG/ACT inhaler Inhale 2 puffs into the lungs every 6 (six) hours as needed. For shortness of breath    . ALPRAZolam (XANAX) 1 MG tablet For sleep as needed. 30 tablet 0  . aspirin EC 81 MG tablet Take 81 mg by mouth 2 (two) times a week.     Marland Kitchen  Biotin 10 MG CAPS Take 1 capsule by mouth daily.     . carbidopa-levodopa (SINEMET IR) 25-100 MG tablet TAKE 1 TABLET 3 TIMES A DAYBEFORE MEALS 270 tablet 0  . cetirizine-pseudoephedrine (ZYRTEC-D) 5-120 MG per tablet Take 1 tablet by mouth as needed. For sinus     . desvenlafaxine (PRISTIQ) 50 MG 24 hr tablet Take 1 tablet (50 mg total) by mouth daily. 30 tablet 3  . halobetasol (ULTRAVATE) 0.05 % cream     . HYDROcodone-acetaminophen (NORCO) 10-325 MG per tablet     . ibuprofen  (ADVIL,MOTRIN) 200 MG tablet Take 200 mg by mouth every 6 (six) hours as needed. For pain    . lansoprazole (PREVACID) 30 MG capsule Take 30 mg by mouth daily.    Marland Kitchen levothyroxine (SYNTHROID, LEVOTHROID) 25 MCG tablet Take 25 mcg by mouth daily.    . mometasone (NASONEX) 50 MCG/ACT nasal spray Place 2 sprays into the nose 2 (two) times daily as needed. For allergies    . montelukast (SINGULAIR) 10 MG tablet Take 10 mg by mouth at bedtime.      . naproxen (NAPROSYN) 500 MG tablet Take 500 mg by mouth daily. For pain    . potassium chloride (K-DUR,KLOR-CON) 10 MEQ tablet Take 20 mEq by mouth daily. With Lasix     . potassium chloride (MICRO-K) 10 MEQ CR capsule Take 10 mEq by mouth daily.    Marland Kitchen torsemide (DEMADEX) 10 MG tablet Take 10 mg by mouth daily. 1/2 to 1 tab daily    . vitamin B-12 (CYANOCOBALAMIN) 1000 MCG tablet Take 1,000 mcg by mouth every other day.      No facility-administered medications prior to visit.    PAST MEDICAL HISTORY: Past Medical History  Diagnosis Date  . Parkinson disease ([place])   . Back pain   . Seasonal allergies   . Complication of anesthesia   . Asthma   . GERD (gastroesophageal reflux disease)   . Anxiety   . Depression   . Hypercholesterolemia   . Arthritis   . Hypothyroid     PAST SURGICAL HISTORY: Past Surgical History  Procedure Laterality Date  . Cholecystectomy    . Knee arthroscopy      right  . Abdominal hysterectomy    . Eye surgery      cataract extraction of left eye-[facility]-2002  . Nissen fundoplication      [facility]  . Cataract extraction w/phaco  12/23/2011    Procedure: CATARACT EXTRACTION PHACO AND INTRAOCULAR LENS PLACEMENT (IOC);  Surgeon: Williams Che, MD;  Location: [facility];  Service: Ophthalmology;  Laterality: Right;  CDE=5.16    FAMILY HISTORY: Family History  Problem Relation Age of Onset  . Stroke Mother   . COPD Father   . Stroke Father     SOCIAL HISTORY: Social History   Social History  . Marital Status:  Married    Spouse Name: N/A  . Number of Children: 2  . Years of Education: N/A   Occupational History  .      retired   Social History Main Topics  . Smoking status: Never Smoker   . Smokeless tobacco: Never Used  . Alcohol Use: No     Comment: occ  . Drug Use: No  . Sexual Activity: Yes    Birth Control/ Protection: Surgical   Other Topics Concern  . Not on file   Social History Narrative   Patient lives at home alone and she is widowed.  Retired.    Education one year business course.   Right handed.   Caffeine two cup of coffee daily and one sweet tea.      PHYSICAL EXAM  Filed Vitals:   07/26/15 1056  BP: 130/82  Pulse: 88  Resp: 20  Height: 5' 3"  (1.6 m)  Weight: 164 lb (74.39 kg)   Body mass index is 29.06 kg/(m^2). Generalized: Well developed, in no acute distress   Neurological examination  Mentation: Alert oriented to time, place, history taking. Follows all commands, the  speech and language fluent Cranial nerve ;  No change in smell and taste. No tongue tremor. Pupils were equal round reactive to light. Extraocular movements were full, visual field were full on confrontational test. Facial sensation and strength were normal. Uvula tongue midline. Head turning and shoulder shrug  were normal and symmetric. Motor: The motor testing reveals 5 / 5 strength of all 4 extremities.  Elevated motor tone is noted throughout. Cog-wheeling bilaterally. Strength loss bilaterally, Grip is weak- cannot pinch , either.  She has weakness lifting  the left foot. Has toe numbness. .  Coordination: Cerebellar testing reveals good finger-nose-finger  Gait and station: Gait is normal, uses a cane outdoors. patient has a slightly stooped posture, but good arm swing and good stride.  Tandem gait is normal. Romberg is negative. No drift is seen.  Reflexes: Deep tendon reflexes are symmetric bilaterally.     DIAGNOSTIC DATA (LABS, IMAGING, TESTING) - I reviewed patient  records, labs, notes, testing and imaging myself where available.  ASSESSMENT AND PLAN 82 y.o. year old female here for a RV of 20 minute duration with more than 50% of the time dedicated to face to face discussion and coordination of care.    has a past medical history of Parkinson disease ([place]); Back pain; Seasonal allergies; Complication of anesthesia; Asthma; GERD (gastroesophageal reflux disease); Anxiety; Depression; Hypercholesterolemia; Arthritis; and Hypothyroid. here with:  1. Depression; she is more tearful-  Currently not on  antidepressants. 2. GERD;I like for her to take prilosec every other day- it helped with reflux.  3  Mrs. Camps's resting tremor is much improved on sinemet TID user,She has titubation, she has good arm swing when walking and turns with 3-4 steps.  4. There is bilateral significant muscle atrophy in both thenar eminences- the palm has completely changed. Would like the patient to undergo a carpal tunnel test and if possible a surgery at least for the right hand which is her dominant hand. Send for NCV and EMG to Dr Jaynee Eagles. Patient has hypothyroidism, not diabetes.  5. The patient feels overall weaker, but is walking well.    Overall the patient is doing well. She will continue to take Sinemet 3 times a day for the treatment of Parkinson's disease.  Patient does report some trouble falling asleep at night. I prescribed Pristiq, but the patient couldn't tolerated it and it was too expensive for her. She is off antidepressants for now.   I have encouraged the patient to use the Xanax if necessary. She is allowed to use Ambien. EMG and NCV were ordered to look for neuropathy in her feet. The carpal tunnel will be treated by Dr. Fredna Dow, hand surgeon.   Patient advised that if her symptoms worsen or she develops new symptoms she should let us know.  Otherwise she will follow-up in 3 months after her hand surgery  with me or NP .  Anijah Spohr, MD    07/26/2015,  11:12  AM [facility] [address] [place], [place] [address] [phone]

## 2015-07-26 NOTE — Patient Instructions (Signed)

## 2015-07-26 NOTE — Telephone Encounter (Signed)
Xanax RX faxed to [facility].

## 2015-07-27 ENCOUNTER — Other Ambulatory Visit: Payer: Self-pay

## 2015-07-27 DIAGNOSIS — R259 Unspecified abnormal involuntary movements: Principal | ICD-10-CM

## 2015-07-27 DIAGNOSIS — G252 Other specified forms of tremor: Secondary | ICD-10-CM

## 2015-07-27 MED ORDER — ALPRAZOLAM 1 MG PO TABS: ORAL_TABLET | ORAL | Status: DC

## 2015-07-27 NOTE — Telephone Encounter (Signed)
New RX with updated sig printed and signed by Dr. Brett Fairy. Faxed to [facility].

## 2015-07-27 NOTE — Progress Notes (Signed)
[facility] sent a fax questioning the xanax prescription written by Dr. Brett Fairy on 07/26/2015. The sig says "1/2 tab for insomnia-as needed". [facility] requesting clarification of frequency. Will send to Dr. Brett Fairy for clarification.

## 2015-07-27 NOTE — Telephone Encounter (Signed)
Received an incoming fax from [facility] requesting clarification on the sig for xanax, specifically the frequency needed. It was written on 07/26/2015 by Dr. Brett Fairy as "1/2 tab for insomnia-as needed." Will send to Dr. Brett Fairy for clarification.

## 2015-08-18 ENCOUNTER — Ambulatory Visit (INDEPENDENT_AMBULATORY_CARE_PROVIDER_SITE_OTHER): Payer: Self-pay | Admitting: Neurology

## 2015-08-18 ENCOUNTER — Ambulatory Visit (INDEPENDENT_AMBULATORY_CARE_PROVIDER_SITE_OTHER): Payer: Medicare Other | Admitting: Neurology

## 2015-08-18 DIAGNOSIS — R200 Anesthesia of skin: Secondary | ICD-10-CM | POA: Insufficient documentation

## 2015-08-18 DIAGNOSIS — Z0289 Encounter for other administrative examinations: Secondary | ICD-10-CM

## 2015-08-18 DIAGNOSIS — G5603 Carpal tunnel syndrome, bilateral upper limbs: Secondary | ICD-10-CM

## 2015-08-18 DIAGNOSIS — G629 Polyneuropathy, unspecified: Secondary | ICD-10-CM | POA: Insufficient documentation

## 2015-08-18 DIAGNOSIS — G609 Hereditary and idiopathic neuropathy, unspecified: Secondary | ICD-10-CM

## 2015-08-18 DIAGNOSIS — E034 Atrophy of thyroid (acquired): Secondary | ICD-10-CM

## 2015-08-18 NOTE — Progress Notes (Signed)
   STUDY DATE: 08/18/2015 PATIENT NAME: [name] DOB: September 21, 1933 MRN: [id number]  HISTORY:  [name] is an 82 year old woman with numbness in both feet, left more than right.   On exam, she has normal vibration sensation at the ankles and above the decreased sensation in all her toes. Reflexes were absent at the ankle. Strength was normal in the feet.  NERVE CONDUCTION STUDIES:  The left peroneal motor response had a reduced amplitude but normal distal latency and velocity. The right peroneal motor response had a borderline reduced amplitude and a normal distal latency and velocity. F-wave responses of the peroneal motor responses were normal bilaterally.  The left posterior tibial motor response had a borderline reduced amplitude and normal distal latency and conduction velocity. The right posterior tibial motor responses had a reduced amplitude but normal distal latency and conduction velocity. F-wave responses of the posterior tibial nerves were normal bilaterally.  The superficial peroneal sensory response were low normal in amplitude.  EMG STUDIES:  Needle EMG of theselect muscles of the left leg was performed. The left iliopsoas and vastus medialis muscles were normal.   There were increased numbers of polyphasic motor units that recruited neuropathically in the gluteus medius, tibialis anterior, peroneus longus, gastrocnemius, and first dorsal interosseous of the foot.  There was no abnormal spontaneous activity noted in any of these muscles. Denervation was most pronounced in the first dorsal interosseous muscle and least in the gluteus medius.  IMPRESSION:  This NCV/EMG study is most consistent with the following: 1.   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   Superimposed left L5 and S1 chronic radiculopathies active features   Richard A. Felecia Shelling, MD, PhD Certified in Neurology, [place] [facility], Sleep Medicine, Pain Medicine and  Neuroimaging  [facility] [address], [place], [place] [address] [phone]

## 2015-09-28 ENCOUNTER — Ambulatory Visit (INDEPENDENT_AMBULATORY_CARE_PROVIDER_SITE_OTHER): Payer: Medicare Other | Admitting: Otolaryngology

## 2015-09-28 ENCOUNTER — Ambulatory Visit (INDEPENDENT_AMBULATORY_CARE_PROVIDER_SITE_OTHER): Payer: Self-pay | Admitting: Otolaryngology

## 2015-10-05 ENCOUNTER — Other Ambulatory Visit: Payer: Self-pay | Admitting: Orthopedic Surgery

## 2015-10-20 ENCOUNTER — Encounter (HOSPITAL_BASED_OUTPATIENT_CLINIC_OR_DEPARTMENT_OTHER): Payer: Self-pay | Admitting: *Deleted

## 2015-10-24 ENCOUNTER — Encounter (HOSPITAL_COMMUNITY)
Admission: RE | Admit: 2015-10-24 | Discharge: 2015-10-24 | Disposition: A | Discharge status: Home / Self Care | Payer: Medicare Other | Source: Ambulatory Visit | Attending: Orthopedic Surgery | Admitting: Orthopedic Surgery

## 2015-10-24 DIAGNOSIS — E039 Hypothyroidism, unspecified: Secondary | ICD-10-CM | POA: Diagnosis not present

## 2015-10-24 DIAGNOSIS — Z888 Allergy status to other drugs, medicaments and biological substances status: Secondary | ICD-10-CM | POA: Diagnosis not present

## 2015-10-24 DIAGNOSIS — E7800 Pure hypercholesterolemia, unspecified: Secondary | ICD-10-CM | POA: Diagnosis not present

## 2015-10-24 DIAGNOSIS — Z9049 Acquired absence of other specified parts of digestive tract: Secondary | ICD-10-CM | POA: Diagnosis not present

## 2015-10-24 DIAGNOSIS — M65341 Trigger finger, right ring finger: Secondary | ICD-10-CM | POA: Diagnosis not present

## 2015-10-24 DIAGNOSIS — G20 Parkinson's disease: Secondary | ICD-10-CM | POA: Diagnosis not present

## 2015-10-24 DIAGNOSIS — F329 Major depressive disorder, single episode, unspecified: Secondary | ICD-10-CM | POA: Diagnosis not present

## 2015-10-24 DIAGNOSIS — F419 Anxiety disorder, unspecified: Secondary | ICD-10-CM | POA: Diagnosis not present

## 2015-10-24 DIAGNOSIS — K219 Gastro-esophageal reflux disease without esophagitis: Secondary | ICD-10-CM | POA: Diagnosis not present

## 2015-10-24 DIAGNOSIS — Z881 Allergy status to other antibiotic agents status: Secondary | ICD-10-CM | POA: Diagnosis not present

## 2015-10-24 DIAGNOSIS — Z91040 Latex allergy status: Secondary | ICD-10-CM | POA: Diagnosis not present

## 2015-10-24 DIAGNOSIS — G5601 Carpal tunnel syndrome, right upper limb: Secondary | ICD-10-CM | POA: Diagnosis not present

## 2015-10-24 DIAGNOSIS — J45909 Unspecified asthma, uncomplicated: Secondary | ICD-10-CM | POA: Diagnosis not present

## 2015-10-24 DIAGNOSIS — M13841 Other specified arthritis, right hand: Secondary | ICD-10-CM | POA: Diagnosis not present

## 2015-10-24 DIAGNOSIS — Z90710 Acquired absence of both cervix and uterus: Secondary | ICD-10-CM | POA: Diagnosis not present

## 2015-10-24 DIAGNOSIS — M1990 Unspecified osteoarthritis, unspecified site: Secondary | ICD-10-CM | POA: Diagnosis not present

## 2015-10-24 LAB — BASIC METABOLIC PANEL
Anion gap: 7 (ref 5–15)
BUN: 13 mg/dL (ref 6–20)
CALCIUM: 8.9 mg/dL (ref 8.9–10.3)
CHLORIDE: 106 mmol/L (ref 101–111)
CO2: 26 mmol/L (ref 22–32)
CREATININE: 0.88 mg/dL (ref 0.44–1.00)
GFR calc non Af Amer: >60 mL/min (ref >60)
GLUCOSE: 93 mg/dL (ref 65–99)
Potassium: 4.1 mmol/L (ref 3.5–5.1)
Sodium: 139 mmol/L (ref 135–145)

## 2015-10-25 ENCOUNTER — Ambulatory Visit (INDEPENDENT_AMBULATORY_CARE_PROVIDER_SITE_OTHER): Payer: Medicare Other | Admitting: Adult Health

## 2015-10-25 ENCOUNTER — Encounter: Payer: Self-pay | Admitting: Adult Health

## 2015-10-25 VITALS — BP 105/67 | HR 80 | Ht 63.0 in | Wt 164.2 lb

## 2015-10-25 DIAGNOSIS — G4700 Insomnia, unspecified: Secondary | ICD-10-CM | POA: Diagnosis not present

## 2015-10-25 DIAGNOSIS — G252 Other specified forms of tremor: Secondary | ICD-10-CM

## 2015-10-25 DIAGNOSIS — R258 Other abnormal involuntary movements: Secondary | ICD-10-CM

## 2015-10-25 DIAGNOSIS — R259 Unspecified abnormal involuntary movements: Principal | ICD-10-CM

## 2015-10-25 MED ORDER — ALPRAZOLAM 0.25 MG PO TABS: 0.2500 mg | ORAL_TABLET | Freq: Every day | ORAL | Status: DC

## 2015-10-25 NOTE — Progress Notes (Signed)
Xanax prescription fax to [facility] mail service at [phone]. Fax receive and confirm.

## 2015-10-25 NOTE — Progress Notes (Signed)
PATIENT: [name] DOB: 19-Mar-1934  REASON FOR VISIT: follow up- resting tremor, insomnia HISTORY FROM: patient  HISTORY OF PRESENT ILLNESS: [name] is a 82 year old female with a history of resting tremor and insomnia. She returns today for follow-up. The patient has continued taking Sinemet 3 times a day. She feels that this has been beneficial for her tremor. She continues to use a cane intermittently when ambulating. Denies any falls. She denies any trouble swallowing. Denies any drooling. She states that she can have surgery on the right hand to remove a ganglion cyst. She reports that she's been using Xanax to help her sleep. She is currently taking 0.5 mg. She states that the medication does make her very groggy the next morning. However she does state that she can become very anxious at times. The Xanax does help her fall asleep. She denies any new neurological symptoms. She returns today for an evaluation.  HISTORY: July 2016 ( MM)   is a 82 year old female with a history of resting tremor referred by Dr. Karie Kirks. She returns today for follow-up. The patient continues to take Sinemet 3 times a day. She reports that her resting tremor has improved. Patient denies any changes with her walking or balance. Denies any falls since the last visit. Denies any trouble swallowing. She does state that she has a goiter. Patient does have trouble falling asleep. She has been prescribed Xanax in the past to help with this. She states that she will take this occasionally but she tries not to take it consistently so that she doesn't become dependent on the medication. The patient states that she recently saw Dr. Karie Kirks and he decreased her Zoloft to half a tablet a day. She states that so far she is tolerating this well. She states that if she starts to feel emotionally or "Antsy" she will take the other half. Overall she feels that she is doing well. Returns today for an  evaluation.  06-15-15 Patient weaned completely off Hydrocodone and takes ibuprofen. She has arthritis in both hands. She has developed significant carpal tunnel in both hands with a shrinkage of the thenar eminence. The patient is right-handed and is significantly enough bothered by the carpal tunnel that I think she should be surgically evaluated.  She feels weaker than last year. In addition her resting tremor appears to be well controlled on 3 times a day Sinemet. She does not have dysarthria or dysphonia, she does have a history of hiatal hernia and reflux. Her primary care physician, Dr. Karie Kirks, has tried to reduce her off the Prilosec and also reduced her antidepressant Zoloft to 50% of the previous dose. Her daughter feels that she gets "snappy " in response and more "abrupt" which is usually not her personality. It may be that Zoloft is no longer working for her and that it would be time to look at anewer antidepressants. I suggested Pristiq at 50 mg daily. I am not sure her medicare plan will allow it. I like for her to take prilosec every other day.   REVIEW OF SYSTEMS: Out of a complete 14 system review of symptoms, the patient complains only of the following symptoms, and all other reviewed systems are negative.  Eye itching, ringing in ears, cough, insomnia, environmental allergies, incontinence of bladder, joint pain, joint swelling, back pain, neck pain, neck stiffness, dizziness, numbness  ALLERGIES: Allergies  Allergen Reactions  . Cephalosporins Rash  . Latex Rash    Breaks out then gets  infected  . Lactose Intolerance (Gi) Nausea And Vomiting  . Lactulose Nausea And Vomiting  . Cephalexin Rash  . Meperidine And Related Nausea And Vomiting    Needed Phenergan to combat nausea.  . Omeprazole Rash    HOME MEDICATIONS: Outpatient Prescriptions Prior to Visit  Medication Sig Dispense Refill  . acetaminophen (TYLENOL) 500 MG tablet Take 500 mg by mouth as needed. For  pain    . albuterol (PROVENTIL HFA;VENTOLIN HFA) 108 (90 BASE) MCG/ACT inhaler Inhale 2 puffs into the lungs every 6 (six) hours as needed. For shortness of breath    . aspirin EC 81 MG tablet Take 81 mg by mouth 2 (two) times a week.     . Biotin 10 MG CAPS Take 1 capsule by mouth daily.     . carbidopa-levodopa (SINEMET IR) 25-100 MG tablet TAKE 1 TABLET 3 TIMES A DAYBEFORE MEALS 270 tablet 0  . cetirizine-pseudoephedrine (ZYRTEC-D) 5-120 MG per tablet Take 1 tablet by mouth as needed. For sinus     . HYDROcodone-acetaminophen (NORCO) 10-325 MG per tablet     . ibuprofen (ADVIL,MOTRIN) 200 MG tablet Take 200 mg by mouth every 6 (six) hours as needed. For pain    . lansoprazole (PREVACID) 30 MG capsule Take 30 mg by mouth daily.    Marland Kitchen levothyroxine (SYNTHROID, LEVOTHROID) 25 MCG tablet Take 25 mcg by mouth daily.    . mometasone (NASONEX) 50 MCG/ACT nasal spray Place 2 sprays into the nose 2 (two) times daily as needed. For allergies    . montelukast (SINGULAIR) 10 MG tablet Take 10 mg by mouth at bedtime.      . potassium chloride (MICRO-K) 10 MEQ CR capsule Take 10 mEq by mouth daily.    . sertraline (ZOLOFT) 100 MG tablet Take 100 mg by mouth daily.    Marland Kitchen torsemide (DEMADEX) 10 MG tablet Take 10 mg by mouth daily. 1/2 to 1 tab daily    . ALPRAZolam (XANAX) 1 MG tablet 1/2 tab at bedtime as needed for insomnia.. 30 tablet 1  . halobetasol (ULTRAVATE) 0.05 % cream Reported on 10/25/2015     No facility-administered medications prior to visit.    PAST MEDICAL HISTORY: Past Medical History  Diagnosis Date  . Parkinson disease ([place])   . Back pain   . Seasonal allergies   . Complication of anesthesia   . GERD (gastroesophageal reflux disease)   . Anxiety   . Depression   . Hypercholesterolemia   . Arthritis   . Hypothyroid   . Ankle swelling     takes torsemide prn  . Asthma     allergy induced  . PONV (postoperative nausea and vomiting)     PAST SURGICAL HISTORY: Past Surgical  History  Procedure Laterality Date  . Cholecystectomy    . Knee arthroscopy      right  . Abdominal hysterectomy    . Eye surgery      cataract extraction of left eye-[facility]-2002  . Nissen fundoplication      [facility]  . Cataract extraction w/phaco  12/23/2011    Procedure: CATARACT EXTRACTION PHACO AND INTRAOCULAR LENS PLACEMENT (IOC);  Surgeon: Williams Che, MD;  Location: [facility];  Service: Ophthalmology;  Laterality: Right;  CDE=5.16    FAMILY HISTORY: Family History  Problem Relation Age of Onset  . Stroke Mother   . COPD Father   . Stroke Father     SOCIAL HISTORY: Social History   Social History  . Marital Status:  Married    Spouse Name: N/A  . Number of Children: 2  . Years of Education: N/A   Occupational History  .      retired   Social History Main Topics  . Smoking status: Never Smoker   . Smokeless tobacco: Never Used  . Alcohol Use: 0.6 oz/week    1 Glasses of wine per week     Comment: occ  . Drug Use: No  . Sexual Activity: Not on file   Other Topics Concern  . Not on file   Social History Narrative   Patient lives at home alone and she is widowed.   Retired.    Education one year business course.   Right handed.   Caffeine two cup of coffee daily and one sweet tea.      PHYSICAL EXAM  Filed Vitals:   10/25/15 1323  BP: 105/67  Pulse: 80  Height: 5' 3"  (1.6 m)  Weight: 164 lb 3.2 oz (74.481 kg)   Body mass index is 29.09 kg/(m^2).  Generalized: Well developed, in no acute distress   Neurological examination  Mentation: Alert oriented to time, place, history taking. Follows all commands speech and language fluent Cranial nerve II-XII: Pupils were equal round reactive to light. Extraocular movements were full, visual field were full on confrontational test. Facial sensation and strength were normal. Uvula tongue midline. Head turning and shoulder shrug  were normal and symmetric. Motor: The motor testing reveals 5 over 5 strength of  all 4 extremities. Good symmetric motor tone is noted throughout. Mild resting tremor bilaterally in hands Sensory: Sensory testing is intact to soft touch on all 4 extremities. No evidence of extinction is noted.  Coordination: Cerebellar testing reveals good finger-nose-finger and heel-to-shin bilaterally.  Gait and station: Patient ambulates without assistance. Tandem gait not attended. Romberg is negative. Reflexes: Deep tendon reflexes are symmetric and normal bilaterally.   DIAGNOSTIC DATA (LABS, IMAGING, TESTING) - I reviewed patient records, labs, notes, testing and imaging myself where available.      Component Value Date/Time   NA 139 10/24/2015 1038   K 4.1 10/24/2015 1038   CL 106 10/24/2015 1038   CO2 26 10/24/2015 1038   GLUCOSE 93 10/24/2015 1038   BUN 13 10/24/2015 1038   CREATININE 0.88 10/24/2015 1038   CALCIUM 8.9 10/24/2015 1038   GFRNONAA >60 10/24/2015 1038   GFRAA >60 10/24/2015 1038     ASSESSMENT AND PLAN 82 y.o. year old female  has a past medical history of Parkinson disease ([place]); Back pain; Seasonal allergies; Complication of anesthesia; GERD (gastroesophageal reflux disease); Anxiety; Depression; Hypercholesterolemia; Arthritis; Hypothyroid; Ankle swelling; Asthma; and PONV (postoperative nausea and vomiting). here with:  1. Resting tremor 2. Insomnia  The patient will continue on Sinemet 25-100 mg 3 times a day. The patient will continue using Xanax as needed at bedtime. However I will reduce her dose to 0.25 mg to see if this eliminates the grogginess she's feeling the next morning. Patient is amenable to this plan. She will follow-up in 6 months or sooner if needed.   Ward Givens, MSN, NP-C 10/25/2015, 1:46 PM [facility] [address], [place], [place] [address] [phone]

## 2015-10-25 NOTE — Patient Instructions (Signed)
Continue Sinemet Try decrease dose of xanax 0.25 mg at bedtime as needed for sleep If your symptoms worsen or you develop new symptoms please let us know.

## 2015-10-26 ENCOUNTER — Ambulatory Visit (HOSPITAL_BASED_OUTPATIENT_CLINIC_OR_DEPARTMENT_OTHER): Payer: Medicare Other | Admitting: Anesthesiology

## 2015-10-26 ENCOUNTER — Ambulatory Visit (HOSPITAL_BASED_OUTPATIENT_CLINIC_OR_DEPARTMENT_OTHER)
Admission: RE | Admit: 2015-10-26 | Discharge: 2015-10-26 | Disposition: A | Discharge status: Home / Self Care | Payer: Medicare Other | Source: Ambulatory Visit | Attending: Orthopedic Surgery | Admitting: Orthopedic Surgery

## 2015-10-26 ENCOUNTER — Encounter (HOSPITAL_BASED_OUTPATIENT_CLINIC_OR_DEPARTMENT_OTHER): Payer: Self-pay | Admitting: Orthopedic Surgery

## 2015-10-26 ENCOUNTER — Encounter (HOSPITAL_BASED_OUTPATIENT_CLINIC_OR_DEPARTMENT_OTHER)
Admission: RE | Disposition: A | Discharge status: Home / Self Care | Payer: Self-pay | Source: Ambulatory Visit | Attending: Orthopedic Surgery

## 2015-10-26 DIAGNOSIS — M1990 Unspecified osteoarthritis, unspecified site: Secondary | ICD-10-CM | POA: Insufficient documentation

## 2015-10-26 DIAGNOSIS — M65341 Trigger finger, right ring finger: Secondary | ICD-10-CM | POA: Diagnosis not present

## 2015-10-26 DIAGNOSIS — M13841 Other specified arthritis, right hand: Secondary | ICD-10-CM | POA: Insufficient documentation

## 2015-10-26 DIAGNOSIS — G5601 Carpal tunnel syndrome, right upper limb: Secondary | ICD-10-CM | POA: Insufficient documentation

## 2015-10-26 DIAGNOSIS — Z91040 Latex allergy status: Secondary | ICD-10-CM | POA: Insufficient documentation

## 2015-10-26 DIAGNOSIS — E7800 Pure hypercholesterolemia, unspecified: Secondary | ICD-10-CM | POA: Insufficient documentation

## 2015-10-26 DIAGNOSIS — F329 Major depressive disorder, single episode, unspecified: Secondary | ICD-10-CM | POA: Insufficient documentation

## 2015-10-26 DIAGNOSIS — Z9049 Acquired absence of other specified parts of digestive tract: Secondary | ICD-10-CM | POA: Insufficient documentation

## 2015-10-26 DIAGNOSIS — K219 Gastro-esophageal reflux disease without esophagitis: Secondary | ICD-10-CM | POA: Insufficient documentation

## 2015-10-26 DIAGNOSIS — G20 Parkinson's disease: Secondary | ICD-10-CM | POA: Insufficient documentation

## 2015-10-26 DIAGNOSIS — J45909 Unspecified asthma, uncomplicated: Secondary | ICD-10-CM | POA: Insufficient documentation

## 2015-10-26 DIAGNOSIS — F419 Anxiety disorder, unspecified: Secondary | ICD-10-CM | POA: Insufficient documentation

## 2015-10-26 DIAGNOSIS — Z888 Allergy status to other drugs, medicaments and biological substances status: Secondary | ICD-10-CM | POA: Insufficient documentation

## 2015-10-26 DIAGNOSIS — Z881 Allergy status to other antibiotic agents status: Secondary | ICD-10-CM | POA: Insufficient documentation

## 2015-10-26 DIAGNOSIS — Z90710 Acquired absence of both cervix and uterus: Secondary | ICD-10-CM | POA: Insufficient documentation

## 2015-10-26 DIAGNOSIS — E039 Hypothyroidism, unspecified: Secondary | ICD-10-CM | POA: Insufficient documentation

## 2015-10-26 HISTORY — PX: CARPOMETACARPEL SUSPENSION PLASTY: SHX5005

## 2015-10-26 HISTORY — DX: Nausea with vomiting, unspecified: R11.2

## 2015-10-26 HISTORY — PX: TRIGGER FINGER RELEASE: SHX641

## 2015-10-26 HISTORY — PX: CARPAL TUNNEL RELEASE: SHX101

## 2015-10-26 HISTORY — DX: Effusion, unspecified ankle: M25.473

## 2015-10-26 HISTORY — DX: Other specified postprocedural states: Z98.890

## 2015-10-26 SURGERY — CARPOMETACARPEL (CMC) SUSPENSION PLASTY
Anesthesia: Regional | Site: Wrist | Laterality: Right

## 2015-10-26 MED ORDER — MIDAZOLAM HCL 2 MG/2ML IJ SOLN: 1.0000 mg | INTRAMUSCULAR | Status: DC | PRN

## 2015-10-26 MED ORDER — MIDAZOLAM HCL 2 MG/2ML IJ SOLN
INTRAMUSCULAR | Status: AC
  Filled 2015-10-26: qty 2

## 2015-10-26 MED ORDER — BUPIVACAINE-EPINEPHRINE (PF) 0.5% -1:200000 IJ SOLN
INTRAMUSCULAR | Status: DC | PRN
  Administered 2015-10-26: 25 mL via PERINEURAL

## 2015-10-26 MED ORDER — ONDANSETRON HCL 4 MG/2ML IJ SOLN
INTRAMUSCULAR | Status: DC | PRN
  Administered 2015-10-26: 4 mg via INTRAVENOUS

## 2015-10-26 MED ORDER — EPHEDRINE SULFATE 50 MG/ML IJ SOLN
INTRAMUSCULAR | Status: DC | PRN
  Administered 2015-10-26 (×3): 10 mg via INTRAVENOUS

## 2015-10-26 MED ORDER — DEXAMETHASONE SODIUM PHOSPHATE 10 MG/ML IJ SOLN
INTRAMUSCULAR | Status: AC
  Filled 2015-10-26: qty 1

## 2015-10-26 MED ORDER — PROPOFOL 10 MG/ML IV BOLUS
INTRAVENOUS | Status: DC | PRN
  Administered 2015-10-26: 150 mg via INTRAVENOUS

## 2015-10-26 MED ORDER — PROPOFOL 10 MG/ML IV BOLUS
INTRAVENOUS | Status: AC
  Filled 2015-10-26: qty 20

## 2015-10-26 MED ORDER — MEPERIDINE HCL 25 MG/ML IJ SOLN
6.2500 mg | INTRAMUSCULAR | Status: DC | PRN
Start: 2015-10-26 — End: 2015-10-26

## 2015-10-26 MED ORDER — BUPIVACAINE HCL (PF) 0.25 % IJ SOLN
INTRAMUSCULAR | Status: AC
  Filled 2015-10-26: qty 120

## 2015-10-26 MED ORDER — OXYCODONE-ACETAMINOPHEN 7.5-325 MG PO TABS: 1.0000 | ORAL_TABLET | ORAL | Status: DC | PRN

## 2015-10-26 MED ORDER — FENTANYL CITRATE (PF) 100 MCG/2ML IJ SOLN
INTRAMUSCULAR | Status: AC
  Filled 2015-10-26: qty 2

## 2015-10-26 MED ORDER — FENTANYL CITRATE (PF) 100 MCG/2ML IJ SOLN
50.0000 ug | INTRAMUSCULAR | Status: DC | PRN
  Administered 2015-10-26: 50 ug via INTRAVENOUS

## 2015-10-26 MED ORDER — GLYCOPYRROLATE 0.2 MG/ML IJ SOLN: 0.2000 mg | Freq: Once | INTRAMUSCULAR | Status: DC | PRN

## 2015-10-26 MED ORDER — VANCOMYCIN HCL IN DEXTROSE 1-5 GM/200ML-% IV SOLN
1000.0000 mg | INTRAVENOUS | Status: AC
  Administered 2015-10-26 (×2): 1000 mg via INTRAVENOUS

## 2015-10-26 MED ORDER — ATROPINE SULFATE 0.4 MG/ML IV SOSY
PREFILLED_SYRINGE | INTRAVENOUS | Status: AC
  Filled 2015-10-26: qty 2.5

## 2015-10-26 MED ORDER — LACTATED RINGERS IV SOLN
INTRAVENOUS | Status: DC
  Administered 2015-10-26: 10:00:00 via INTRAVENOUS
  Administered 2015-10-26: 10 mL/h via INTRAVENOUS

## 2015-10-26 MED ORDER — SUCCINYLCHOLINE CHLORIDE 200 MG/10ML IV SOSY
PREFILLED_SYRINGE | INTRAVENOUS | Status: AC
  Filled 2015-10-26: qty 10

## 2015-10-26 MED ORDER — VANCOMYCIN HCL IN DEXTROSE 1-5 GM/200ML-% IV SOLN
INTRAVENOUS | Status: AC
  Filled 2015-10-26: qty 200

## 2015-10-26 MED ORDER — MIDAZOLAM HCL 2 MG/2ML IJ SOLN
INTRAMUSCULAR | Status: AC
Start: 2015-10-26 — End: 2015-10-26
  Filled 2015-10-26: qty 2

## 2015-10-26 MED ORDER — PROPOFOL 500 MG/50ML IV EMUL
INTRAVENOUS | Status: AC
  Filled 2015-10-26: qty 50

## 2015-10-26 MED ORDER — FENTANYL CITRATE (PF) 100 MCG/2ML IJ SOLN
25.0000 ug | INTRAMUSCULAR | Status: DC | PRN
Start: 2015-10-26 — End: 2015-10-26

## 2015-10-26 MED ORDER — ONDANSETRON HCL 4 MG/2ML IJ SOLN: INTRAMUSCULAR | Status: DC | PRN

## 2015-10-26 MED ORDER — SCOPOLAMINE 1 MG/3DAYS TD PT72: 1.0000 | MEDICATED_PATCH | Freq: Once | TRANSDERMAL | Status: DC | PRN

## 2015-10-26 MED ORDER — PHENYLEPHRINE 40 MCG/ML (10ML) SYRINGE FOR IV PUSH (FOR BLOOD PRESSURE SUPPORT)
PREFILLED_SYRINGE | INTRAVENOUS | Status: AC
  Filled 2015-10-26: qty 10

## 2015-10-26 MED ORDER — CHLORHEXIDINE GLUCONATE 4 % EX LIQD: 60.0000 mL | Freq: Once | CUTANEOUS | Status: DC

## 2015-10-26 MED ORDER — LIDOCAINE HCL (CARDIAC) 20 MG/ML IV SOLN
INTRAVENOUS | Status: DC | PRN
Start: 2015-10-26 — End: 2015-10-26
  Administered 2015-10-26: 50 mg via INTRAVENOUS

## 2015-10-26 MED ORDER — LIDOCAINE 2% (20 MG/ML) 5 ML SYRINGE
INTRAMUSCULAR | Status: AC
  Filled 2015-10-26: qty 5

## 2015-10-26 MED ORDER — ONDANSETRON HCL 4 MG/2ML IJ SOLN
INTRAMUSCULAR | Status: AC
  Filled 2015-10-26: qty 2

## 2015-10-26 MED ORDER — DEXAMETHASONE SODIUM PHOSPHATE 4 MG/ML IJ SOLN
INTRAMUSCULAR | Status: DC | PRN
Start: 2015-10-26 — End: 2015-10-26
  Administered 2015-10-26: 8 mg via INTRAVENOUS

## 2015-10-26 MED ORDER — EPHEDRINE 5 MG/ML INJ
INTRAVENOUS | Status: AC
  Filled 2015-10-26: qty 10

## 2015-10-26 SURGICAL SUPPLY — 62 items
BANDAGE COBAN STERILE 2 (GAUZE/BANDAGES/DRESSINGS) ×4 IMPLANT
BLADE ARTHRO LOK 4 BEAVER (BLADE) IMPLANT
BLADE MINI RND TIP GREEN BEAV (BLADE) ×3 IMPLANT
BLADE SURG 15 STRL LF DISP TIS (BLADE) ×3 IMPLANT
BLADE SURG 15 STRL SS (BLADE) ×4
BNDG CMPR 9X4 STRL LF SNTH (GAUZE/BANDAGES/DRESSINGS) ×3
BNDG COHESIVE 3X5 TAN STRL LF (GAUZE/BANDAGES/DRESSINGS) ×4 IMPLANT
BNDG ESMARK 4X9 LF (GAUZE/BANDAGES/DRESSINGS) ×4 IMPLANT
BNDG GAUZE ELAST 4 BULKY (GAUZE/BANDAGES/DRESSINGS) ×4 IMPLANT
BUR EGG 3PK/BX (BURR) IMPLANT
CHLORAPREP W/TINT 26ML (MISCELLANEOUS) ×4 IMPLANT
CORDS BIPOLAR (ELECTRODE) ×4 IMPLANT
COVER BACK TABLE 60X90IN (DRAPES) ×4 IMPLANT
COVER MAYO STAND STRL (DRAPES) ×4 IMPLANT
CUFF TOURNIQUET SINGLE 18IN (TOURNIQUET CUFF) ×1 IMPLANT
DECANTER SPIKE VIAL GLASS SM (MISCELLANEOUS) IMPLANT
DRAPE EXTREMITY T 121X128X90 (DRAPE) ×4 IMPLANT
DRAPE OEC MINIVIEW 54X84 (DRAPES) ×4 IMPLANT
DRAPE SURG 17X23 STRL (DRAPES) ×4 IMPLANT
GAUZE SPONGE 4X4 12PLY STRL (GAUZE/BANDAGES/DRESSINGS) ×4 IMPLANT
GAUZE SPONGE 4X4 16PLY XRAY LF (GAUZE/BANDAGES/DRESSINGS) IMPLANT
GAUZE XEROFORM 1X8 LF (GAUZE/BANDAGES/DRESSINGS) ×4 IMPLANT
GLOVE BIOGEL PI IND STRL 8 (GLOVE) IMPLANT
GLOVE BIOGEL PI IND STRL 8.5 (GLOVE) ×3 IMPLANT
GLOVE BIOGEL PI INDICATOR 8 (GLOVE) ×3
GLOVE BIOGEL PI INDICATOR 8.5 (GLOVE) ×1
GLOVE SURG ORTHO 8.0 STRL STRW (GLOVE) ×4 IMPLANT
GLOVE SURG SS PI 7.5 STRL IVOR (GLOVE) ×1 IMPLANT
GLOVE SURG SS PI 8.0 STRL IVOR (GLOVE) ×1 IMPLANT
GOWN STRL REUS W/ TWL LRG LVL3 (GOWN DISPOSABLE) ×3 IMPLANT
GOWN STRL REUS W/TWL LRG LVL3 (GOWN DISPOSABLE) ×4
GOWN STRL REUS W/TWL XL LVL3 (GOWN DISPOSABLE) ×6 IMPLANT
NDL PRECISIONGLIDE 27X1.5 (NEEDLE) ×3 IMPLANT
NEEDLE PRECISIONGLIDE 27X1.5 (NEEDLE) ×4 IMPLANT
NS IRRIG 1000ML POUR BTL (IV SOLUTION) ×4 IMPLANT
PACK BASIN DAY SURGERY FS (CUSTOM PROCEDURE TRAY) ×4 IMPLANT
PAD CAST 3X4 CTTN HI CHSV (CAST SUPPLIES) ×3 IMPLANT
PADDING CAST ABS 3INX4YD NS (CAST SUPPLIES)
PADDING CAST ABS COTTON 3X4 (CAST SUPPLIES) IMPLANT
PADDING CAST COTTON 3X4 STRL (CAST SUPPLIES) ×4
RUBBERBAND STERILE (MISCELLANEOUS) IMPLANT
SLEEVE SCD COMPRESS KNEE MED (MISCELLANEOUS) ×4 IMPLANT
SPLINT PLASTER CAST XFAST 3X15 (CAST SUPPLIES) IMPLANT
SPLINT PLASTER XTRA FASTSET 3X (CAST SUPPLIES)
STOCKINETTE 4X48 STRL (DRAPES) ×4 IMPLANT
SUT ETHIBOND 2 OS 4 DA (SUTURE) IMPLANT
SUT ETHIBOND 3-0 V-5 (SUTURE) IMPLANT
SUT ETHILON 4 0 PS 2 18 (SUTURE) ×8 IMPLANT
SUT FIBERWIRE 2-0 18 17.9 3/8 (SUTURE)
SUT FIBERWIRE 4-0 18 DIAM BLUE (SUTURE) ×4
SUT MERSILENE 4 0 P 3 (SUTURE) IMPLANT
SUT STEEL 3 0 (SUTURE) ×4 IMPLANT
SUT VIC AB 4-0 P-3 18XBRD (SUTURE) IMPLANT
SUT VIC AB 4-0 P2 18 (SUTURE) IMPLANT
SUT VIC AB 4-0 P3 18 (SUTURE) ×4
SUTURE FIBERWR 2-0 18 17.9 3/8 (SUTURE) IMPLANT
SUTURE FIBERWR 4-0 18 DIA BLUE (SUTURE) ×3 IMPLANT
SYR BULB 3OZ (MISCELLANEOUS) ×4 IMPLANT
SYR CONTROL 10ML LL (SYRINGE) ×4 IMPLANT
TOWEL OR 17X24 6PK STRL BLUE (TOWEL DISPOSABLE) ×8 IMPLANT
TOWEL OR NON WOVEN STRL DISP B (DISPOSABLE) ×4 IMPLANT
UNDERPAD 30X30 (UNDERPADS AND DIAPERS) ×4 IMPLANT

## 2015-10-26 NOTE — Progress Notes (Signed)
Assisted Dr. Al Corpus with right, ultrasound guided, infraclavicular block. Side rails up, monitors on throughout procedure. See vital signs in flow sheet. Tolerated Procedure well.

## 2015-10-26 NOTE — H&P (Signed)
[name] is an 82 y.o. female.   Chief Complaint: pain right thumb HPI: [name] is an 82 yo female withf bilateral CMC arthritis carpal tunnel syndrome bilaterally, degenerative arthritis PIP joints. Her right thumb CMC is particularly bothersome. This has been injected in the past.She is also complaining of trigger of her right ring finger. She is complaining primarily of her right thumb basilar joint. This has been injected approximately three months ago with minimal relief. She states that it has gotten much worse    Past Medical History  Diagnosis Date  . Parkinson disease ([place])   . Back pain   . Seasonal allergies   . Complication of anesthesia   . GERD (gastroesophageal reflux disease)   . Anxiety   . Depression   . Hypercholesterolemia   . Arthritis   . Hypothyroid   . Ankle swelling     takes torsemide prn  . Asthma     allergy induced  . PONV (postoperative nausea and vomiting)     Past Surgical History  Procedure Laterality Date  . Cholecystectomy    . Knee arthroscopy      right  . Abdominal hysterectomy    . Eye surgery      cataract extraction of left eye-[facility]-2002  . Nissen fundoplication      [facility]  . Cataract extraction w/phaco  12/23/2011    Procedure: CATARACT EXTRACTION PHACO AND INTRAOCULAR LENS PLACEMENT (IOC);  Surgeon: Williams Che, MD;  Location: [facility];  Service: Ophthalmology;  Laterality: Right;  CDE=5.16    Family History  Problem Relation Age of Onset  . Stroke Mother   . COPD Father   . Stroke Father    Social History:  reports that she has never smoked. She has never used smokeless tobacco. She reports that she drinks about 0.6 oz of alcohol per week. She reports that she does not use illicit drugs.  Allergies:  Allergies  Allergen Reactions  . Cephalosporins Rash  . Latex Rash    Breaks out then gets infected  . Lactose Intolerance (Gi) Nausea And Vomiting  . Lactulose Nausea And Vomiting  . Cephalexin Rash  .  Meperidine And Related Nausea And Vomiting    Needed Phenergan to combat nausea.  . Omeprazole Rash    No prescriptions prior to admission    Results for orders placed or performed during the hospital encounter of 10/24/15 (from the past 48 hour(s))  Basic metabolic panel     Status: None   Collection Time: 10/24/15 10:38 AM  Result Value Ref Range   Sodium 139 135 - 145 mmol/L   Potassium 4.1 3.5 - 5.1 mmol/L   Chloride 106 101 - 111 mmol/L   CO2 26 22 - 32 mmol/L   Glucose, Bld 93 65 - 99 mg/dL   BUN 13 6 - 20 mg/dL   Creatinine, Ser 0.88 0.44 - 1.00 mg/dL   Calcium 8.9 8.9 - 10.3 mg/dL   GFR calc non Af Amer >60 >60 mL/min   GFR calc Af Amer >60 >60 mL/min    Comment: (NOTE) The eGFR has been calculated using the CKD EPI equation. This calculation has not been validated in all clinical situations. eGFR's persistently <60 mL/min signify possible Chronic Kidney Disease.    Anion gap 7 5 - 15    No results found.   Pertinent items are noted in HPI.  Height 5' 3"  (1.6 m), weight 72.576 kg (160 lb).  General appearance: alert, cooperative  and appears stated age Head: Normocephalic, without obvious abnormality Neck: no JVD Resp: clear to auscultation bilaterally Cardio: regular rate and rhythm, S1, S2 normal, no murmur, click, rub or gallop GI: soft, non-tender; bowel sounds normal; no masses,  no organomegaly Extremities: pain and numbness right hand with catching ring finger Pulses: 2+ and symmetric Skin: Skin color, texture, turgor normal. No rashes or lesions Neurologic: Grossly normal Incision/Wound: na  Assessment/Plan Assessment:  1. Primary osteoarthritis of first carpometacarpal joint of right hand  2. Trigger right ring finger 3. Carpal tunnel syndrome right   Plan: She would like to undergo surgical reconstruction of her right thumb basilar joint pre and postoperative course were discussed along with risks and complications. She is aware that there is  no guarantee with surgery. Risk of infection, recurrence, injury to arteries, nerves, tendons, dystrophy. She is scheduled for release of trigger finger, right ring finger. Trapezial excision with suspension plasty using APL tendon, right thumb. Release right carpal tunnel.This was scheduled as an outpatient under regional anesthesia. This will be scheduled at her convenience. She does have risk factors including Parkinson's.   Braya Habermehl R 10/26/2015, 6:20 AM

## 2015-10-26 NOTE — Brief Op Note (Signed)
10/26/2015  10:09 AM  PATIENT:  [name]  82 y.o. female  PRE-OPERATIVE DIAGNOSIS:  TRIGGER RIGHT RING FINGER END STAGE OSTEOARTHRITIC, DEGENERATIVE RIGHT THUMB CARPOMETACARPAL JOINT  POST-OPERATIVE DIAGNOSIS:  TRIGGER RIGHT RING FINGER END STAGE OSTEOARTHRITIC,  PROCEDURE:  Procedure(s): RIGHT HAND TRAPEZIECTOMY WITH THUMB METACARPEL SUSPENSION PLASTY (Right) RELEASE A-1 PULLEY RIGHT RING FINGER (Right) CARPAL TUNNEL RELEASE (Right)  SURGEON:  Surgeon(s) and Role:    * Daryll Brod, MD - Primary    * Leanora Cover, MD  PHYSICIAN ASSISTANT:   ASSISTANTS: K Emmily Pellegrin,MD   ANESTHESIA:   regional and general  EBL:  Total I/O In: 1100 [I.V.:1100] Out: 5 [Blood:5]  BLOOD ADMINISTERED:none  DRAINS: none   LOCAL MEDICATIONS USED:  NONE  SPECIMEN:  No Specimen  DISPOSITION OF SPECIMEN:  N/A  COUNTS:  YES  TOURNIQUET:   Total Tourniquet Time Documented: Upper Arm (Right) - 71 minutes Total: Upper Arm (Right) - 71 minutes   DICTATION: .Other Dictation: Dictation Number [id number]  PLAN OF CARE: Discharge to home after PACU  PATIENT DISPOSITION:  PACU - hemodynamically stable.

## 2015-10-26 NOTE — Op Note (Signed)
Dictation Number [id number]

## 2015-10-26 NOTE — Anesthesia Preprocedure Evaluation (Signed)
Anesthesia Evaluation  Patient identified by MRN, date of birth, ID band Patient awake    Reviewed: Allergy & Precautions, NPO status , Patient's Chart, lab work & pertinent test results  History of Anesthesia Complications (+) PONV  Airway Mallampati: I  TM Distance: >3 FB Neck ROM: Full    Dental  (+) Teeth Intact, Dental Advisory Given   Pulmonary    breath sounds clear to auscultation       Cardiovascular  Rhythm:Regular Rate:Normal     Neuro/Psych    GI/Hepatic GERD  Medicated and Controlled,  Endo/Other    Renal/GU      Musculoskeletal   Abdominal   Peds  Hematology   Anesthesia Other Findings   Reproductive/Obstetrics                             Anesthesia Physical Anesthesia Plan  ASA: II  Anesthesia Plan: General   Post-op Pain Management: GA combined w/ Regional for post-op pain   Induction: Intravenous  Airway Management Planned: LMA  Additional Equipment:   Intra-op Plan:   Post-operative Plan: Extubation in OR  Informed Consent: I have reviewed the patients History and Physical, chart, labs and discussed the procedure including the risks, benefits and alternatives for the proposed anesthesia with the patient or authorized representative who has indicated his/her understanding and acceptance.   Dental advisory given  Plan Discussed with: CRNA, Anesthesiologist and Surgeon  Anesthesia Plan Comments:         Anesthesia Quick Evaluation

## 2015-10-26 NOTE — Op Note (Signed)
I assisted Surgeon(s) and Role:    * Daryll Brod, MD - Primary    * Leanora Cover, MD on the Procedure(s): RIGHT HAND TRAPEZIECTOMY WITH THUMB [place] A-1 PULLEY RIGHT RING FINGER CARPAL TUNNEL RELEASE on 10/26/2015.  I provided assistance on this case as follows: retraction of soft tissues, harvest of tendon graft, passing of graft.  Electronically signed by: Tennis Must, MD Date: 10/26/2015 Time: 10:06 AM

## 2015-10-26 NOTE — Anesthesia Procedure Notes (Addendum)
Anesthesia Regional Block:  Infraclavicular brachial plexus block  Pre-Anesthetic Checklist: ,, timeout performed, Correct Patient, Correct Site, Correct Laterality, Correct Procedure, Correct Position, site marked, Risks and benefits discussed,  Surgical consent,  Pre-op evaluation,  At surgeon's request and post-op pain management  Laterality: Right and Upper  Prep: chloraprep       Needles:  Injection technique: Single-shot  Needle Type: Echogenic Stimulator Needle     Needle Length: 5cm 5 cm Needle Gauge: 21 and 21 G    Additional Needles:  Procedures: ultrasound guided (picture in chart) Infraclavicular brachial plexus block Narrative:  Start time: 10/26/2015 8:09 AM End time: 10/26/2015 8:14 AM Injection made incrementally with aspirations every 5 mL.  Performed by: Personally  Anesthesiologist: CREWS, DAVID   Procedure Name: LMA Insertion Date/Time: 10/26/2015 8:39 AM Performed by: Melynda Ripple D Pre-anesthesia Checklist: Patient identified, Emergency Drugs available, Suction available and Patient being monitored Patient Re-evaluated:Patient Re-evaluated prior to inductionOxygen Delivery Method: Circle System Utilized Preoxygenation: Pre-oxygenation with 100% oxygen Intubation Type: IV induction Ventilation: Mask ventilation without difficulty LMA: LMA inserted LMA Size: 4.0 Number of attempts: 1 Airway Equipment and Method: Bite block Placement Confirmation: positive ETCO2 Tube secured with: Tape Dental Injury: Teeth and Oropharynx as per pre-operative assessment       Right Infraclavicular block image

## 2015-10-26 NOTE — Discharge Instructions (Addendum)

## 2015-10-26 NOTE — Transfer of Care (Signed)
Immediate Anesthesia Transfer of Care Note  Patient: [name]  Procedure(s) Performed: Procedure(s): RIGHT HAND TRAPEZIECTOMY WITH THUMB METACARPEL SUSPENSION PLASTY (Right) RELEASE A-1 PULLEY RIGHT RING FINGER (Right) CARPAL TUNNEL RELEASE (Right)  Patient Location: PACU  Anesthesia Type:GA combined with regional for post-op pain  Level of Consciousness: awake, alert  and oriented  Airway & Oxygen Therapy: Patient Spontanous Breathing and Patient connected to face mask oxygen  Post-op Assessment: Report given to RN, Post -op Vital signs reviewed and stable and Patient moving all extremities  Post vital signs: Reviewed and stable  Last Vitals:  Filed Vitals:   10/26/15 0810 10/26/15 1011  BP: 124/69   Pulse: 66 92  Temp:    Resp: 16 11    Last Pain:  Filed Vitals:   10/26/15 1011  PainSc: 6          Complications: No apparent anesthesia complications

## 2015-10-26 NOTE — Progress Notes (Signed)
I agree with the assessment and plan as directed by NP .The patient is known to me .   [place], MD  

## 2015-10-26 NOTE — Anesthesia Postprocedure Evaluation (Signed)
Anesthesia Post Note  Patient: [name]  Procedure(s) Performed: Procedure(s) (LRB): RIGHT HAND TRAPEZIECTOMY WITH THUMB METACARPEL SUSPENSION PLASTY (Right) RELEASE A-1 PULLEY RIGHT RING FINGER (Right) CARPAL TUNNEL RELEASE (Right)  Patient location during evaluation: PACU Anesthesia Type: General Level of consciousness: awake and alert Pain management: pain level controlled Vital Signs Assessment: post-procedure vital signs reviewed and stable Respiratory status: spontaneous breathing, nonlabored ventilation and respiratory function stable Cardiovascular status: blood pressure returned to baseline and stable Postop Assessment: no signs of nausea or vomiting Anesthetic complications: no    Last Vitals:  Filed Vitals:   10/26/15 1045 10/26/15 1110  BP: 112/58 143/72  Pulse: 84 92  Temp:  36.5 C  Resp: 18 18    Last Pain:  Filed Vitals:   10/26/15 1111  PainSc: 0-No pain                 Tyrene Nader A

## 2015-10-27 ENCOUNTER — Encounter (HOSPITAL_BASED_OUTPATIENT_CLINIC_OR_DEPARTMENT_OTHER): Payer: Self-pay | Admitting: Orthopedic Surgery

## 2015-10-27 NOTE — Op Note (Signed)
NAMEEVAMARIE, RAETZ NO.:  [id number]  MEDICAL RECORD NO.:  [id number]  LOCATION:                                 FACILITY:  PHYSICIAN:  Daryll Brod, M.D.       DATE OF BIRTH:  28-Jan-1934  DATE OF PROCEDURE:  10/26/2015 DATE OF DISCHARGE:                              OPERATIVE REPORT   PREOPERATIVE DIAGNOSES:  Trigger finger, right ring finger.  Carpal tunnel syndrome, right hand.  Carpometacarpal arthritis, right thumb.  POSTOPERATIVE DIAGNOSES:  Trigger finger, right ring finger.  Carpal tunnel syndrome, right hand.  Carpometacarpal arthritis, right thumb.  OPERATIONS:  Release of A1 pulley, right ring finger.  Carpal tunnel release, right hand.  Excision of trapezium with abductor pollicis longus tendon transfers (suspensionplasty, right thumb).  SURGEON:  Daryll Brod, M.D.  ASSISTANT:  Leanora Cover, MD  ANESTHESIA:  Supraclavicular block, general.  ANESTHESIOLOGIST:  Lorrene Reid, M.D.  PLACE OF SURGERY:  [facility].  HISTORY:  The patient is an 82 year old female with a long history of CMC arthritis.  This has been injected on multiple occasions.  She has carpal tunnel syndrome bilaterally.  Nerve conduction is positive.  She has triggering of her right ring finger.  This has not responded to conservative treatment.  She has elected to undergo treatment for the [facility] joint without release of the A1 pulley of the ring finger, carpal tunnel release, right hand.  Pre, peri and postoperative course have been discussed along with risks and complications.  She is aware that there was no guarantee with the surgery; possibility of infection; recurrence of injury to arteries, nerves, tendons; incomplete relief of symptoms; dystrophy.  In the preoperative area, the patient is seen, the extremity marked by both patient and surgeon, and antibiotic given.  PROCEDURE IN DETAIL:  The patient was brought to the operating room after a regional anesthetic  was carried out without difficulty in the preoperative area by Dr. Al Corpus.  A general anesthetic was then added to this.  She was prepped and draped using ChloraPrep, supine position with the right arm free.  A 3-minute dry time was allowed, time-out taken, confirmed the patient and procedure.  The limb was exsanguinated with an Esmarch bandage.  A tourniquet placed on the upper arm was inflated to 250 mmHg.  A release of the A1 pulley of the right ring finger was proceeded.  First, an oblique incision made over the A1 pulley, carried down through the subcutaneous tissue.  Bleeder was electrocauterized with bipolar.  The A1 pulley was identified, this was released on its radial aspect.  Very significant thickening of the pulley was noted, a small incision was made centrally in A2.  The two tendons were then separated relieving any adhesions between the two, the finger was placed through full passive range of motion and no further triggering was noted.  A separate longitudinal incision was then made in the right palm, carried down through the subcutaneous tissue.  Bleeders were again electrocauterized with bipolar.  The palmar fascia was split.  The superficial palmar arch was identified.  The hypothenar musculature proceeded across the carpal retinaculum, this was dissected free,  coagulated as necessary to provide bleeding.  The flexor retinaculum was then released on its radial aspect after identifying the flexor tendon to the ring finger.  Retractors were placed radially and ulnarly protecting the median nerve radially and the ulnar nerve ulnarly. Flexor retinaculum was then incised with sharp dissection.  Right angle and Sewall retractor were placed between the skin and forearm fascia. The fascia was released for approximately 3 cm proximal to the wrist crease under direct vision.  Canal was explored.  Air compression to the nerve was apparent.  The motor branch entered into the muscle  distally, no further lesions were identified.  The wound was irrigated with saline and the skin closed with interrupted 4-0 nylon sutures along with the incision from the A1 pulley release.  A separate L-shaped incision was then made over the base of the thumb metacarpal, carried along the abductor pollicis brevis tendon sheath, first dorsal compartment, carried down through the subcutaneous tissue.  Bleeder was again electrocauterized with bipolar.  The radial sensory nerve was identified, this was protected.  The dissection was then carried identifying the radial artery proximally.  The carpometacarpal joint was then incised, significant degenerative changes with total erosion of the cartilage was noted.  The STT joint was then opened proximally.  The periosteum was then elevated off from the trapezium.  The linked joint between the trapezium and trapezoid was opened, this was levered allowing the trapezium to be isolated.  The trapezium was then excised with a rongeur.  Care was taken to remove any small fragments of bone distally in the area between the base of the index metacarpal and trapezium.  Moderate erosion was present at that level.  The most dorsal aspect attachment of the abductor pollicis longus tendon was then isolated, this was left attached to the base of the thumb metacarpal.  A separate incision made proximally over the outrigger muscles.  The dissection carried down to the muscle, this was identified with traction on the tendon distally and the most dorsal aspect, the abductor pollicis longus was transected at the musculotendinous junction.  This was then delivered distally.  This was done with a cheese cutter monofilament wire.  Drill holes were then placed in the base of the thumb metacarpal in a distal to proximal, radial to ulnar position exiting at the base of the thumb metacarpal.  A second drill hole was made in the index metacarpal base in a volar to dorsal  direction.  The abductor pollicis longus tendon was then transferred through each of the holes in distal to proximal direction to the base of the thumb and then up through the base of the index.  A tunnel was then made underneath the extensor tendons and the remainder of the abductor pollicis longus was brought out into the defect created by excision of the trapezium.  Small incision was made dorsally to allow passage of the tendon at the hole of the base of the index metacarpal.  The wounds were irrigated.  The incision for harvesting the abductor pollicis longus and the hole on the incision on the dorsal aspect of the index metacarpal were closed with interrupted 4-0 nylon sutures and from the excision of the trapezium was then copiously irrigated with saline.  The transected portion of the abductor pollicis longus was then brought around the tendon where it entered into the base of the index metacarpal with full traction suspending this, this was sutured into position with 4-0 FiberWire suture, this was then passed  around the flexor carpi radialis and sutured to it separately again with 4-0 FiberWire sutures.  This fully suspended the thumb from the index.  Wound was again irrigated.  The capsule closed as much as possible with interrupted 4-0 Vicryl sutures, the subcutaneous tissue with interrupted 4-0 Vicryl and the skin with interrupted 4-0 nylon sutures.  A sterile compressive dressing, thumb spica splint was applied with dorsal palmar splitting.  The tourniquet was deflated.  All fingers were immediately pinked and she was taken to the recovery room for observation in satisfactory condition.  She will be discharged to home to return to the [place] in 1 week, on Percocet.    ______________________________ Daryll Brod, M.D.   ______________________________ Daryll Brod, M.D.    GK/MEDQ  D:  10/26/2015  T:  10/27/2015  Job:  [id number]

## 2015-11-01 ENCOUNTER — Telehealth: Payer: Self-pay | Admitting: Adult Health

## 2015-11-01 NOTE — Telephone Encounter (Signed)
[facility] [place] [facility] said the pt has sent someone to pick up ALPRAZolam (XANAX) 0.25 MG tablet. But they didn't have the RX. In checking this the RX was sent to [facility]. Please call to let the pt know

## 2015-11-01 NOTE — Telephone Encounter (Signed)
Called and spoke to pt .  I relayed that prescription for [facility]anaz [facility].  She will follow up on this and call us back if needed.

## 2015-11-02 ENCOUNTER — Ambulatory Visit (INDEPENDENT_AMBULATORY_CARE_PROVIDER_SITE_OTHER): Payer: Medicare Other | Admitting: Otolaryngology

## 2016-01-08 ENCOUNTER — Telehealth: Payer: Self-pay | Admitting: Adult Health

## 2016-01-08 MED ORDER — CARBIDOPA-LEVODOPA 25-100 MG PO TABS: 1.0000 | ORAL_TABLET | Freq: Three times a day (TID) | ORAL | 0 refills | Status: DC

## 2016-01-08 NOTE — Addendum Note (Signed)
Addended byOliver Hum on: 01/08/2016 05:02 PM   Modules accepted: Orders

## 2016-01-08 NOTE — Telephone Encounter (Signed)
Patient requesting refill of carbidopa-levodopa (SINEMET IR) 25-100 MG tablet Pharmacy:  [facility], [place]   Pt will be out of medication on Wednesday morning. Can she get a one month rx until her shipment comes in? [facility] [address] [facility]

## 2016-01-08 NOTE — Telephone Encounter (Signed)
Done

## 2016-01-09 ENCOUNTER — Other Ambulatory Visit: Payer: Self-pay

## 2016-01-09 MED ORDER — CARBIDOPA-LEVODOPA 25-100 MG PO TABS: 1.0000 | ORAL_TABLET | Freq: Three times a day (TID) | ORAL | 0 refills | Status: DC

## 2016-01-09 NOTE — Telephone Encounter (Signed)
Received fax from [facility] requesting carbidopa/levodopa to be sent to [facility].

## 2016-01-26 ENCOUNTER — Other Ambulatory Visit: Payer: Self-pay | Admitting: Adult Health

## 2016-01-26 MED ORDER — CARBIDOPA-LEVODOPA 25-100 MG PO TABS: 1.0000 | ORAL_TABLET | Freq: Three times a day (TID) | ORAL | 3 refills | Status: DC

## 2016-01-26 NOTE — Telephone Encounter (Signed)
Pt called said she has not rec'd the mail order refill from [facility]. I relayed that [place] had rec'd the RX. I gave her date it was rec'd and time and advised her to call [facility] which she agreed to do.  She is asking for 90 day supply and 3 refills on the next RX. Can new RX be sent now to [facility] or will it have to wait? Please call the pt

## 2016-01-26 NOTE — Telephone Encounter (Signed)
Received another fax from [facility] for Sinemet. Rx re-sent w/ refills as requested.

## 2016-02-05 ENCOUNTER — Other Ambulatory Visit: Payer: Self-pay | Admitting: Adult Health

## 2016-02-16 ENCOUNTER — Other Ambulatory Visit: Payer: Self-pay | Admitting: Adult Health

## 2016-04-03 ENCOUNTER — Telehealth: Payer: Self-pay | Admitting: *Deleted

## 2016-04-03 MED ORDER — CARBIDOPA-LEVODOPA 25-100 MG PO TABS: 1.0000 | ORAL_TABLET | Freq: Three times a day (TID) | ORAL | 1 refills | Status: DC

## 2016-04-03 NOTE — Telephone Encounter (Signed)
90 day rx. of [facility]emet escribed [facility] per [facility]ed request/fim

## 2016-04-24 ENCOUNTER — Ambulatory Visit (INDEPENDENT_AMBULATORY_CARE_PROVIDER_SITE_OTHER): Payer: Medicare Other | Admitting: Adult Health

## 2016-04-24 ENCOUNTER — Encounter: Payer: Self-pay | Admitting: Adult Health

## 2016-04-24 VITALS — BP 133/73 | HR 73 | Ht 63.0 in | Wt 165.0 lb

## 2016-04-24 DIAGNOSIS — G629 Polyneuropathy, unspecified: Secondary | ICD-10-CM | POA: Diagnosis not present

## 2016-04-24 DIAGNOSIS — R251 Tremor, unspecified: Secondary | ICD-10-CM | POA: Diagnosis not present

## 2016-04-24 DIAGNOSIS — G4700 Insomnia, unspecified: Secondary | ICD-10-CM

## 2016-04-24 NOTE — Progress Notes (Signed)
PATIENT: [name] DOB: 26-Jun-1933  REASON FOR VISIT: follow up-tremor, insomnia HISTORY FROM: patient  HISTORY OF PRESENT ILLNESS:  [name] is an 82 year old female with a history of resting tremor and insomnia. She returns today for follow-up. She is currently on Sinemet 3 times a day. She reports that this is beneficial for her tremor. She states that she also has been diagnosed with polyneuropathy in the lower extremities. This was confirmed by nerve conduction study/EMG. She states that more recently she's noticed that the hands go numb. Not always at the same time. She states that it only last for several minutes and then it improves. She has not noticed  if it happens with certain activities or not. She does report that she has been diagnosed with carpal tunnel syndrome in the hands. She has surgery on the right hand for carpal tunnel and a ganglion cyst removal. She states that occasionally she has to use Xanax for sleep. Reports that she does not use it every night. Denies any trouble swallowing. No changes with her gait or balance. She returns today for an evaluation.  HISTORY [name] is a 82 year old female with a history of resting tremor and insomnia. She returns today for follow-up. The patient has continued taking Sinemet 3 times a day. She feels that this has been beneficial for her tremor. She continues to use a cane intermittently when ambulating. Denies any falls. She denies any trouble swallowing. Denies any drooling. She states that she can have surgery on the right hand to remove a ganglion cyst. She reports that she's been using Xanax to help her sleep. She is currently taking 0.5 mg. She states that the medication does make her very groggy the next morning. However she does state that she can become very anxious at times. The Xanax does help her fall asleep. She denies any new neurological symptoms. She returns today for an evaluation.  HISTORY: July 2016 ( MM)    is a 82 year old female with a history of resting tremor referred by Dr. Karie Kirks. She returns today for follow-up. The patient continues to take Sinemet 3 times a day. She reports that her resting tremor has improved. Patient denies any changes with her walking or balance. Denies any falls since the last visit. Denies any trouble swallowing. She does state that she has a goiter. Patient does have trouble falling asleep. She has been prescribed Xanax in the past to help with this. She states that she will take this occasionally but she tries not to take it consistently so that she doesn't become dependent on the medication. The patient states that she recently saw Dr. Karie Kirks and he decreased her Zoloft to half a tablet a day. She states that so far she is tolerating this well. She states that if she starts to feel emotionally or "Antsy" she will take the other half. Overall she feels that she is doing well. Returns today for an evaluation.  06-15-15 Patient weaned completely off Hydrocodone and takes ibuprofen. She has arthritis in both hands. She has developed significant carpal tunnel in both hands with a shrinkage of the thenar eminence. The patient is right-handed and is significantly enough bothered by the carpal tunnel that I think she should be surgically evaluated.  She feels weaker than last year. In addition her resting tremor appears to be well controlled on 3 times a day Sinemet. She does not have dysarthria or dysphonia, she does have a history of hiatal hernia and  reflux. Her primary care physician, Dr. Karie Kirks, has tried to reduce her off the Prilosec and also reduced her antidepressant Zoloft to 50% of the previous dose. Her daughter feels that she gets "snappy " in response and more "abrupt" which is usually not her personality. It may be that Zoloft is no longer working for her and that it would be time to look at anewer antidepressants. I suggested Pristiq at 50 mg daily. I am not  sure her medicare plan will allow it. I like for her to take prilosec every other day.   REVIEW OF SYSTEMS: Out of a complete 14 system review of symptoms, the patient complains only of the following symptoms, and all other reviewed systems are negative.  Insomnia, diarrhea, nausea, excessive eating, excessive thirst, eye itching, ringing in ears, drooling, back pain, muscle cramps, neck pain, nervous/anxious, headache, numbness, incontinence of bladder, environmental allergies  ALLERGIES: Allergies  Allergen Reactions  . Cephalosporins Rash  . Latex Rash    Breaks out then gets infected  . Lactose Intolerance (Gi) Nausea And Vomiting  . Lactulose Nausea And Vomiting  . Cephalexin Rash  . Meperidine And Related Nausea And Vomiting    Needed Phenergan to combat nausea.  . Omeprazole Rash    HOME MEDICATIONS: Outpatient Medications Prior to Visit  Medication Sig Dispense Refill  . acetaminophen (TYLENOL) 500 MG tablet Take 500 mg by mouth as needed. For pain    . albuterol (PROVENTIL HFA;VENTOLIN HFA) 108 (90 BASE) MCG/ACT inhaler Inhale 2 puffs into the lungs every 6 (six) hours as needed. For shortness of breath    . ALPRAZolam (XANAX) 0.25 MG tablet Take 1 tablet (0.25 mg total) by mouth at bedtime. 30 tablet 5  . Biotin 10 MG CAPS Take 1 capsule by mouth daily.     . carbidopa-levodopa (SINEMET IR) 25-100 MG tablet Take 1 tablet by mouth 3 (three) times daily. 270 tablet 1  . cetirizine-pseudoephedrine (ZYRTEC-D) 5-120 MG per tablet Take 1 tablet by mouth as needed. For sinus     . HYDROcodone-acetaminophen (NORCO) 10-325 MG per tablet     . ibuprofen (ADVIL,MOTRIN) 200 MG tablet Take 200 mg by mouth every 6 (six) hours as needed. For pain    . levothyroxine (SYNTHROID, LEVOTHROID) 25 MCG tablet Take 25 mcg by mouth daily.    . mometasone (NASONEX) 50 MCG/ACT nasal spray Place 2 sprays into the nose 2 (two) times daily as needed. For allergies    . montelukast (SINGULAIR) 10 MG  tablet Take 10 mg by mouth at bedtime.      Marland Kitchen oxyCODONE-acetaminophen (PERCOCET) 7.5-325 MG tablet Take 1 tablet by mouth every 4 (four) hours as needed for severe pain. 30 tablet 0  . potassium chloride (MICRO-K) 10 MEQ CR capsule Take 10 mEq by mouth daily.    . sertraline (ZOLOFT) 100 MG tablet Take 50 mg by mouth daily.     Marland Kitchen torsemide (DEMADEX) 10 MG tablet Take 10 mg by mouth daily. 1/2 to 1 tab daily    . aspirin EC 81 MG tablet Take 81 mg by mouth 2 (two) times a week.     . lansoprazole (PREVACID) 30 MG capsule Take 30 mg by mouth daily.     No facility-administered medications prior to visit.     PAST MEDICAL HISTORY: Past Medical History:  Diagnosis Date  . Ankle swelling    takes torsemide prn  . Anxiety   . Arthritis   . Asthma    allergy  induced  . Back pain   . Complication of anesthesia   . Depression   . GERD (gastroesophageal reflux disease)   . Hypercholesterolemia   . Hypothyroid   . Parkinson disease ([place])   . PONV (postoperative nausea and vomiting)   . Seasonal allergies     PAST SURGICAL HISTORY: Past Surgical History:  Procedure Laterality Date  . ABDOMINAL HYSTERECTOMY    . CARPAL TUNNEL RELEASE Right 10/26/2015   Procedure: CARPAL TUNNEL RELEASE;  Surgeon: Daryll Brod, MD;  Location: [facility];  Service: Orthopedics;  Laterality: Right;  . CARPOMETACARPEL SUSPENSION PLASTY Right 10/26/2015   Procedure: RIGHT HAND TRAPEZIECTOMY WITH THUMB METACARPEL SUSPENSION PLASTY;  Surgeon: Daryll Brod, MD;  Location: [facility];  Service: Orthopedics;  Laterality: Right;  . CATARACT EXTRACTION W/PHACO  12/23/2011   Procedure: CATARACT EXTRACTION PHACO AND INTRAOCULAR LENS PLACEMENT (IOC);  Surgeon: Williams Che, MD;  Location: [facility];  Service: Ophthalmology;  Laterality: Right;  CDE=5.16  . CHOLECYSTECTOMY    . EYE SURGERY     cataract extraction of left eye-[facility]-2002  . KNEE ARTHROSCOPY     right  . NISSEN FUNDOPLICATION      [facility]  . TRIGGER FINGER RELEASE Right 10/26/2015   Procedure: RELEASE A-1 PULLEY RIGHT RING FINGER;  Surgeon: Daryll Brod, MD;  Location: [facility];  Service: Orthopedics;  Laterality: Right;    FAMILY HISTORY: Family History  Problem Relation Age of Onset  . Stroke Mother   . COPD Father   . Stroke Father     SOCIAL HISTORY: Social History   Social History  . Marital status: Married    Spouse name: N/A  . Number of children: 2  . Years of education: N/A   Occupational History  .      retired   Social History Main Topics  . Smoking status: Never Smoker  . Smokeless tobacco: Never Used  . Alcohol use 0.6 oz/week    1 Glasses of wine per week     Comment: occ  . Drug use: No  . Sexual activity: Not on file   Other Topics Concern  . Not on file   Social History Narrative   Patient lives at home alone and she is widowed.   Retired.    Education one year business course.   Right handed.   Caffeine two cup of coffee daily and one sweet tea.      PHYSICAL EXAM  Vitals:   04/24/16 1405  BP: 133/73  Pulse: 73  Weight: 165 lb (74.8 kg)  Height: 5' 3"  (1.6 m)   Body mass index is 29.23 kg/m.  Generalized: Well developed, in no acute distress   Neurological examination  Mentation: Alert oriented to time, place, history taking. Follows all commands speech and language fluent Cranial nerve II-XII: Pupils were equal round reactive to light. Extraocular movements were full, visual field were full on confrontational test. Facial sensation and strength were normal. Uvula tongue midline. Head turning and shoulder shrug  were normal and symmetric. Motor: The motor testing reveals 5 over 5 strength of all 4 extremities. Good symmetric motor tone is noted throughout.  Sensory: Sensory testing is intact to soft touch on all 4 extremities. No evidence of extinction is noted.  Coordination: Cerebellar testing reveals good finger-nose-finger and heel-to-shin  bilaterally.  Gait and station: Gait is normal. Tandem gait is unsteady.. Romberg is negative. No drift is seen.  Reflexes: Deep tendon reflexes are symmetric  and normal bilaterally.   DIAGNOSTIC DATA (LABS, IMAGING, TESTING) - I reviewed patient records, labs, notes, testing and imaging myself where available.      Component Value Date/Time   NA 139 10/24/2015 1038   K 4.1 10/24/2015 1038   CL 106 10/24/2015 1038   CO2 26 10/24/2015 1038   GLUCOSE 93 10/24/2015 1038   BUN 13 10/24/2015 1038   CREATININE 0.88 10/24/2015 1038   CALCIUM 8.9 10/24/2015 1038   GFRNONAA >60 10/24/2015 1038   GFRAA >60 10/24/2015 1038      ASSESSMENT AND PLAN 82 y.o. year old female  has a past medical history of Ankle swelling; Anxiety; Arthritis; Asthma; Back pain; Complication of anesthesia; Depression; GERD (gastroesophageal reflux disease); Hypercholesterolemia; Hypothyroid; Parkinson disease ([place]); PONV (postoperative nausea and vomiting); and Seasonal allergies. here with:  1. Tremor  2. Polyneuropathy 3. Insomnia  Continue on Sinemet 25-100 milligrams 3 times a day. Patient advised that the numbness in her hands could be a result of carpal tunnel and polyneuropathy. We could complete nerve conduction studies with EMG however the patient would like to wait to see if this becomes worse. She will continue using Xanax as needed. Advised that she should not use this nightly. She verbalized understanding. She will follow-up in 6 months with Dr. Mechele Claude, MSN, NP-C 04/24/2016, 2:10 PM [facility] [address] [place], [place] [address] [phone]

## 2016-04-24 NOTE — Patient Instructions (Signed)
Continue sinemet If your symptoms worsen or you develop new symptoms please let us know.

## 2016-04-26 NOTE — Progress Notes (Signed)
I agree with the assessment and plan as directed by NP .The patient is known to me .   Beula Joyner, MD  

## 2016-05-10 ENCOUNTER — Telehealth: Payer: Self-pay | Admitting: Adult Health

## 2016-05-10 NOTE — Telephone Encounter (Signed)
Patient does have a resting tremor which is consistent with parkinson's. We suspect parkinson's but are following the clinical course for development of additional symptoms.

## 2016-05-10 NOTE — Telephone Encounter (Signed)
Spoke to Ethan with Dr. Vickey Sages office and gave her the message from MM/NP.  I faxed copy of note to her as well [phone] to show Dr. Karie Kirks.

## 2016-05-10 NOTE — Telephone Encounter (Signed)
[name] [phone] said since 2010 pt has dx of parkinson's. Recent note has dx of resting tremor. She said Dr Karie Kirks wants to know if the dx has changed. Please call

## 2016-10-22 ENCOUNTER — Encounter: Payer: Self-pay | Admitting: Neurology

## 2016-10-22 ENCOUNTER — Encounter (INDEPENDENT_AMBULATORY_CARE_PROVIDER_SITE_OTHER): Payer: Self-pay

## 2016-10-22 ENCOUNTER — Ambulatory Visit (INDEPENDENT_AMBULATORY_CARE_PROVIDER_SITE_OTHER): Payer: Medicare Other | Admitting: Neurology

## 2016-10-22 VITALS — BP 153/87 | HR 83 | Ht 62.0 in | Wt 158.0 lb

## 2016-10-22 DIAGNOSIS — G20 Parkinson's disease: Secondary | ICD-10-CM | POA: Diagnosis not present

## 2016-10-22 MED ORDER — CARBIDOPA-LEVODOPA 25-100 MG PO TABS: 1.0000 | ORAL_TABLET | Freq: Three times a day (TID) | ORAL | 1 refills | Status: DC

## 2016-10-22 NOTE — Progress Notes (Signed)
PATIENT: [name] DOB: 02/04/1934  REASON FOR VISIT: follow up-tremor, insomnia HISTORY FROM: patient  HISTORY OF PRESENT ILLNESS:   10-22-2016 [name] is an 83 year old female with a history of resting tremor and insomnia. She has been diagnosed 11 years ago.  She has worried about her ability to walk and implemented a strong exercise regimen, stationary bicycle 3 times a week. She feels improvement . She likes to walk, does not need a cane any more !   She is currently on Sinemet 3 times a day, sometimes forgets the last dose. . She reports that this is beneficial for her tremor and she noted a difference when not taking it.  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  She states that more recently she's noticed that the hands go numb - it only last for several minutes and then it improves. She has had surgery on the right hand for carpal tunnel syndrome in the hands. She needed a tendon replacement (?) and a ganglion cyst removal, but her right hand I has lost strength. 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  She states that occasionally she has to use Xanax for sleep. Reports that she does not use it every night. Denies any trouble swallowing or speaking. No falls and no changes with her gait or balance. She returns today for an evaluation.  HISTORY [name] is a 83 year old female with a history of resting tremor and insomnia. She returns today for follow-up. The patient has continued taking Sinemet 3 times a day. She feels that this has been beneficial for her tremor. She continues to use a cane intermittently when ambulating. Denies any falls. She denies any trouble swallowing. Denies any drooling. She states that she can have surgery on the right hand to remove a ganglion cyst. She reports that she's been using Xanax to help her sleep. She is  currently taking 0.5 mg. She states that the medication does make her very groggy the next morning. However she does state that she can become very anxious at times. The Xanax does help her fall asleep. She denies any new neurological symptoms. She returns today for an evaluation.  HISTORY: July 2016 ( MM)  is a 83 year old female with a history of resting tremor referred by Dr. Karie Kirks. She returns today for follow-up. The patient continues to take Sinemet 3 times a day. She reports that her resting tremor has improved. Patient denies any changes with her walking or balance. Denies any falls since the last visit. Denies any trouble swallowing. She does state that she has a goiter. Patient does have trouble falling asleep. She has been prescribed Xanax in the past to help with this. She states that she will take this occasionally but she tries not to take it consistently so that she doesn't become dependent on the medication. The patient states that she recently saw Dr. Karie Kirks and he decreased her Zoloft to half a tablet a day. She states that so far she is tolerating this well. She states that if she starts to feel emotionally or "Antsy" she will take the other half. Overall she feels that she is doing well. Returns today for an evaluation.  06-15-15; Patient weaned completely off Hydrocodone and takes ibuprofen. She has arthritis in both hands. She has developed significant carpal tunnel in both hands with a shrinkage of the  thenar eminence.The patient is right-handed and is significantly enough bothered by the carpal tunnel that I think she should be surgically evaluated.She feels weaker than last year. In addition her resting tremor appears to be well controlled on 3 times a day Sinemet. She does not have dysarthria or dysphonia, she does have a history of hiatal hernia and reflux. Her primary care physician, Dr. Karie Kirks, has tried to reduce her off the Prilosec and also reduced her antidepressant  Zoloft to 50% of the previous dose. Her daughter feels that she gets "snappy " in response and more "abrupt" which is usually not her personality. It may be that Zoloft is no longer working for her and that it would be time to look at anewer antidepressants. I suggested Pristiq at 50 mg daily. I am not sure her medicare plan will allow it. I like for her to take prilosec every other day.   REVIEW OF SYSTEMS: Out of a complete 14 system review of symptoms, the patient complains only of the following symptoms, and all other reviewed systems are negative. Epworth Sleepiness score 4 points,  48 points on Fatigue Severity, geriatric depression score  4/15 p. Insomnia has improved on xanax , but without it she is unable to sleep in less than 2 hours. , diarrhea, nausea, excessive eating, excessive thirst, eye itching, ringing in ears, drooling, back pain, muscle cramps, neck pain, nervous/anxious, headache, numbness, incontinence of bladder, environmental allergies  ALLERGIES: Allergies  Allergen Reactions  . Cephalosporins Rash  . Latex Rash    Breaks out then gets infected  . Lactose Intolerance (Gi) Nausea And Vomiting  . Lactulose Nausea And Vomiting  . Cephalexin Rash  . Meperidine And Related Nausea And Vomiting    Needed Phenergan to combat nausea.  . Omeprazole Rash    HOME MEDICATIONS: Outpatient Medications Prior to Visit  Medication Sig Dispense Refill  . acetaminophen (TYLENOL) 500 MG tablet Take 500 mg by mouth as needed. For pain    . albuterol (PROVENTIL HFA;VENTOLIN HFA) 108 (90 BASE) MCG/ACT inhaler Inhale 2 puffs into the lungs every 6 (six) hours as needed. For shortness of breath    . ALPRAZolam (XANAX) 0.25 MG tablet Take 1 tablet (0.25 mg total) by mouth at bedtime. 30 tablet 5  . Biotin 10 MG CAPS Take 1 capsule by mouth daily.     . carbidopa-levodopa (SINEMET IR) 25-100 MG tablet Take 1 tablet by mouth 3 (three) times daily. 270 tablet 1  . cetirizine-pseudoephedrine  (ZYRTEC-D) 5-120 MG per tablet Take 1 tablet by mouth as needed. For sinus     . Cyanocobalamin (RA VITAMIN B-12 TR) 1000 MCG TBCR Take by mouth.    . fluticasone (FLOVENT HFA) 220 MCG/ACT inhaler TAKE 2 PUFFS FOR INHALATION FOR BREATHING    . HYDROcodone-acetaminophen (NORCO) 10-325 MG per tablet     . ibuprofen (ADVIL,MOTRIN) 200 MG tablet Take 200 mg by mouth every 6 (six) hours as needed. For pain    . levocetirizine (XYZAL) 5 MG tablet Take 5 mg by mouth every evening.    Marland Kitchen levothyroxine (SYNTHROID, LEVOTHROID) 25 MCG tablet Take 25 mcg by mouth daily.    . mometasone (NASONEX) 50 MCG/ACT nasal spray Place 2 sprays into the nose 2 (two) times daily as needed. For allergies    . montelukast (SINGULAIR) 10 MG tablet Take 10 mg by mouth at bedtime.      Marland Kitchen oxyCODONE-acetaminophen (PERCOCET) 7.5-325 MG tablet Take 1 tablet by mouth every 4 (four) hours  as needed for severe pain. 30 tablet 0  . potassium chloride (MICRO-K) 10 MEQ CR capsule Take 10 mEq by mouth daily.    Marland Kitchen QVAR 80 MCG/ACT inhaler     . sertraline (ZOLOFT) 100 MG tablet Take 50 mg by mouth daily.     Marland Kitchen torsemide (DEMADEX) 10 MG tablet Take 10 mg by mouth daily. 1/2 to 1 tab daily     No facility-administered medications prior to visit.     PAST MEDICAL HISTORY: Past Medical History:  Diagnosis Date  . Ankle swelling    takes torsemide prn  . Anxiety   . Arthritis   . Asthma    allergy induced  . Back pain   . Complication of anesthesia   . Depression   . GERD (gastroesophageal reflux disease)   . Hypercholesterolemia   . Hypothyroid   . Parkinson disease ([place])   . PONV (postoperative nausea and vomiting)   . Seasonal allergies     PAST SURGICAL HISTORY: Past Surgical History:  Procedure Laterality Date  . ABDOMINAL HYSTERECTOMY    . CARPAL TUNNEL RELEASE Right 10/26/2015   Procedure: CARPAL TUNNEL RELEASE;  Surgeon: Daryll Brod, MD;  Location: [facility];  Service: Orthopedics;  Laterality:  Right;  . CARPOMETACARPEL SUSPENSION PLASTY Right 10/26/2015   Procedure: RIGHT HAND TRAPEZIECTOMY WITH THUMB METACARPEL SUSPENSION PLASTY;  Surgeon: Daryll Brod, MD;  Location: [facility];  Service: Orthopedics;  Laterality: Right;  . CATARACT EXTRACTION W/PHACO  12/23/2011   Procedure: CATARACT EXTRACTION PHACO AND INTRAOCULAR LENS PLACEMENT (IOC);  Surgeon: Williams Che, MD;  Location: [facility];  Service: Ophthalmology;  Laterality: Right;  CDE=5.16  . CHOLECYSTECTOMY    . EYE SURGERY     cataract extraction of left eye-[facility]-2002  . KNEE ARTHROSCOPY     right  . NISSEN FUNDOPLICATION     [facility]  . TRIGGER FINGER RELEASE Right 10/26/2015   Procedure: RELEASE A-1 PULLEY RIGHT RING FINGER;  Surgeon: Daryll Brod, MD;  Location: [facility];  Service: Orthopedics;  Laterality: Right;    FAMILY HISTORY: Family History  Problem Relation Age of Onset  . Stroke Mother   . COPD Father   . Stroke Father     SOCIAL HISTORY: Social History   Social History  . Marital status: Married    Spouse name: N/A  . Number of children: 2  . Years of education: N/A   Occupational History  .      retired   Social History Main Topics  . Smoking status: Never Smoker  . Smokeless tobacco: Never Used  . Alcohol use 0.6 oz/week    1 Glasses of wine per week     Comment: occ  . Drug use: No  . Sexual activity: Not on file   Other Topics Concern  . Not on file   Social History Narrative   Patient lives at home alone and she is widowed.   Retired.    Education one year business course.   Right handed.   Caffeine two cup of coffee daily and one sweet tea.      PHYSICAL EXAM  Vitals:   10/22/16 1110  BP: (!) 153/87  Pulse: 83  Weight: 158 lb (71.7 kg)  Height: 5' 2"  (1.575 m)   Body mass index is 28.9 kg/m.  Generalized: Well developed, in no acute distress   Neurological examination  Mentation: Alert oriented to time, place, history taking. Follows all  commands speech  and language fluent Cranial nerve :  Loss of sense of smell- severe. Taste less affected. Pupils are equal round reactive to light. No ptosis,Extraocular movements were full, visual field were full on confrontational test. Facial sensation and strength were normal. Uvula tongue midline. Head turning and shoulder shrug  were normal and symmetric. Motor: T symmetric motor tone is noted throughout. No cogwheeling. Right hand strength is reduced, pinch and grip .  Sensory:  No evidence of extinction over the upper extremities, feet are numb to vibration.  Coordination:  good finger-nose-finger bilaterally.  Gait and station: Gait is normal. Tandem gait is unsteady. Ataxia.   Reflexes: Deep tendon reflexes are symmetric and normal bilaterally.   DIAGNOSTIC DATA (LABS, IMAGING, TESTING) - I reviewed patient records, labs, notes, testing and imaging myself where available.    ASSESSMENT AND PLAN 83 y.o. year old female  has a past medical history of Ankle swelling; Anxiety; Arthritis; Asthma; Back pain; Complication of anesthesia; Depression; GERD (gastroesophageal reflux disease); Hypercholesterolemia; Hypothyroid; Parkinson disease ([place]); PONV (postoperative nausea and vomiting); and Seasonal allergies. here with:  1. Tremor , dopamine responsive -thought to reflect  PD. Inhaler makes tremor stronger.  2. Polyneuropathy, Carpaltunnel 3. Insomnia improved. 4. Hypertension, Improved   Continue on Sinemet 25-100 milligrams 3 times a day. Patient advised that the numbness in her hands could be a result of carpal tunnel and polyneuropathy. We could complete nerve conduction studies with EMG however the patient would like to wait to see if this becomes worse. She will continue using Xanax as needed. Advised that she should not use this nightly.  She verbalized understanding. She will follow-up in 6 months with either me, Dr. Brett Fairy, or NP.    Larey Seat, MD  10/22/2016, 11:20  AM [facility] [address] [place], [place] [address] [phone]

## 2016-10-22 NOTE — Patient Instructions (Signed)
Parkinson Disease Parkinson disease is a long-term (chronic) condition. It gets worse over time (is progressive). Parkinson disease limits your ability:  To control how your body moves.  To move your body normally. This condition affects each person differently. The condition can range from mild to very bad. This condition tends to progress slowly over many years. Follow these instructions at home:  Take over-the-counter and prescription medicines only as told by your doctor.  Put grab bars and railings in your home. These help to prevent falls.  Follow instructions from your doctor about what you can or cannot eat or drink.  Go back to your normal activities as told by your doctor. Ask your doctor what activities are safe for you.  Exercise as told by your doctor or physical therapist.  Keep all follow-up visits as told by your doctor. This is important. These include any visits with a speech or occupational therapist.  Think about joining a support group for people who have Parkinson disease. Contact a doctor if:  Medicines do not help your symptoms.  You feel off-balance.  You fall at home.  You need more help at home.  You have:  Trouble swallowing.  A very hard time pooping (constipation).  A lot of side effects from your medicines.  You see or hear things that are not real (hallucinate).  You feel:  Confused.  Anxious.  Sad (depressed). Get help right away if:  You were hurt in a fall.  You cannot swallow without choking.  You have chest pain.  You have trouble breathing.  You do not feel safe at home. This information is not intended to replace advice given to you by your health care provider. Make sure you discuss any questions you have with your health care provider. Document Released: 08/19/2011 Document Revised: 11/02/2015 Document Reviewed: 03/17/2015 Elsevier Interactive Patient Education  2017 [employer].

## 2017-02-26 ENCOUNTER — Encounter: Payer: Self-pay | Admitting: Internal Medicine

## 2017-04-09 ENCOUNTER — Ambulatory Visit: Payer: Medicare Other | Admitting: Nurse Practitioner

## 2017-04-09 ENCOUNTER — Encounter: Payer: Self-pay | Admitting: Nurse Practitioner

## 2017-04-09 ENCOUNTER — Telehealth: Payer: Self-pay | Admitting: Nurse Practitioner

## 2017-04-09 NOTE — Progress Notes (Deleted)
Primary Care Physician:  Lemmie Evens, MD Primary Gastroenterologist:  Dr. Gala Romney  No chief complaint on file.   HPI:   [name] is a 83 y.o. female who presents on referral from primary care for persistent diarrhea. The patient has not been seen by our office since about 2008. No history of colonoscopy can be found in our system.   Today she states   Past Medical History:  Diagnosis Date  . Ankle swelling    takes torsemide prn  . Anxiety   . Arthritis   . Asthma    allergy induced  . Back pain   . Complication of anesthesia   . Depression   . GERD (gastroesophageal reflux disease)   . Hypercholesterolemia   . Hypothyroid   . Parkinson disease ([place])   . PONV (postoperative nausea and vomiting)   . Seasonal allergies     Past Surgical History:  Procedure Laterality Date  . ABDOMINAL HYSTERECTOMY    . CARPAL TUNNEL RELEASE Right 10/26/2015   Procedure: CARPAL TUNNEL RELEASE;  Surgeon: Daryll Brod, MD;  Location: [place];  Service: Orthopedics;  Laterality: Right;  . CARPOMETACARPEL SUSPENSION PLASTY Right 10/26/2015   Procedure: RIGHT HAND TRAPEZIECTOMY WITH THUMB METACARPEL SUSPENSION PLASTY;  Surgeon: Daryll Brod, MD;  Location: [address];  Service: Orthopedics;  Laterality: Right;  . CATARACT EXTRACTION W/PHACO  12/23/2011   Procedure: CATARACT EXTRACTION PHACO AND INTRAOCULAR LENS PLACEMENT (IOC);  Surgeon: Williams Che, MD;  Location: [facility];  Service: Ophthalmology;  Laterality: Right;  CDE=5.16  . CHOLECYSTECTOMY    . EYE SURGERY     cataract extraction of left eye-[facility]-2002  . KNEE ARTHROSCOPY     right  . NISSEN FUNDOPLICATION     [facility]  . TRIGGER FINGER RELEASE Right 10/26/2015   Procedure: RELEASE A-1 PULLEY RIGHT RING FINGER;  Surgeon: Daryll Brod, MD;  Location: [place];  Service: Orthopedics;  Laterality: Right;    Current Outpatient Prescriptions  Medication Sig Dispense Refill  .  acetaminophen (TYLENOL) 500 MG tablet Take 500 mg by mouth as needed. For pain    . albuterol (PROVENTIL HFA;VENTOLIN HFA) 108 (90 BASE) MCG/ACT inhaler Inhale 2 puffs into the lungs every 6 (six) hours as needed. For shortness of breath    . ALPRAZolam (XANAX) 0.25 MG tablet Take 1 tablet (0.25 mg total) by mouth at bedtime. 30 tablet 5  . azithromycin (ZITHROMAX) 250 MG tablet Take 250 mg by mouth daily.  0  . Biotin 10 MG CAPS Take 1 capsule by mouth daily.     . carbidopa-levodopa (SINEMET IR) 25-100 MG tablet Take 1 tablet by mouth 3 (three) times daily. 270 tablet 1  . cetirizine-pseudoephedrine (ZYRTEC-D) 5-120 MG per tablet Take 1 tablet by mouth as needed. For sinus     . Cyanocobalamin (RA VITAMIN B-12 TR) 1000 MCG TBCR Take by mouth.    . erythromycin ophthalmic ointment APPLY 1 APPLICATION TO AFFECTED EYE(S) EVERY FOUR HOURS FOR 5 DAYS  0  . fluticasone (FLOVENT HFA) 220 MCG/ACT inhaler TAKE 2 PUFFS FOR INHALATION FOR BREATHING    . HYDROcodone-acetaminophen (NORCO) 10-325 MG per tablet     . ibuprofen (ADVIL,MOTRIN) 200 MG tablet Take 200 mg by mouth every 6 (six) hours as needed. For pain    . levocetirizine (XYZAL) 5 MG tablet Take 5 mg by mouth every evening.    Marland Kitchen levothyroxine (SYNTHROID, LEVOTHROID) 25 MCG tablet Take 25 mcg by  mouth daily.    . mometasone (NASONEX) 50 MCG/ACT nasal spray Place 2 sprays into the nose 2 (two) times daily as needed. For allergies    . mometasone-formoterol (DULERA) 100-5 MCG/ACT AERO Inhale 2 puffs into the lungs 2 (two) times daily.    . montelukast (SINGULAIR) 10 MG tablet Take 10 mg by mouth at bedtime.      Marland Kitchen oxyCODONE-acetaminophen (PERCOCET) 7.5-325 MG tablet Take 1 tablet by mouth every 4 (four) hours as needed for severe pain. 30 tablet 0  . potassium chloride (MICRO-K) 10 MEQ CR capsule Take 10 mEq by mouth daily.    Marland Kitchen QVAR 80 MCG/ACT inhaler     . QVAR REDIHALER 80 MCG/ACT AERB INHALATION TAKE 2 PUFFS DAILY TO PREVENT BREATHING PROBLEMS.   7  . sertraline (ZOLOFT) 100 MG tablet Take 50 mg by mouth daily.     Marland Kitchen torsemide (DEMADEX) 10 MG tablet Take 10 mg by mouth daily. 1/2 to 1 tab daily     No current facility-administered medications for this visit.     Allergies as of 04/09/2017 - Review Complete 10/22/2016  Allergen Reaction Noted  . Cephalosporins Rash 10/19/2010  . Latex Rash 01/17/2011  . Lactose intolerance (gi) Nausea And Vomiting 12/09/2011  . Lactulose Nausea And Vomiting 06/15/2015  . Cephalexin Rash 06/15/2015  . Meperidine and related Nausea And Vomiting 10/19/2010  . Omeprazole Rash 06/15/2015    Family History  Problem Relation Age of Onset  . Stroke Mother   . COPD Father   . Stroke Father     Social History   Social History  . Marital status: Married    Spouse name: N/A  . Number of children: 2  . Years of education: N/A   Occupational History  .      retired   Social History Main Topics  . Smoking status: Never Smoker  . Smokeless tobacco: Never Used  . Alcohol use 0.6 oz/week    1 Glasses of wine per week     Comment: occ  . Drug use: No  . Sexual activity: Not on file   Other Topics Concern  . Not on file   Social History Narrative   Patient lives at home alone and she is widowed.   Retired.    Education one year business course.   Right handed.   Caffeine two cup of coffee daily and one sweet tea.    Review of Systems: General: Negative for anorexia, weight loss, fever, chills, fatigue, weakness. Eyes: Negative for vision changes.  ENT: Negative for hoarseness, difficulty swallowing , nasal congestion. CV: Negative for chest pain, angina, palpitations, dyspnea on exertion, peripheral edema.  Respiratory: Negative for dyspnea at rest, dyspnea on exertion, cough, sputum, wheezing.  GI: See history of present illness. GU:  Negative for dysuria, hematuria, urinary incontinence, urinary frequency, nocturnal urination.  MS: Negative for joint pain, low back pain.  Derm:  Negative for rash or itching.  Neuro: Negative for weakness, abnormal sensation, seizure, frequent headaches, memory loss, confusion.  Psych: Negative for anxiety, depression, suicidal ideation, hallucinations.  Endo: Negative for unusual weight change.  Heme: Negative for bruising or bleeding. Allergy: Negative for rash or hives.    Physical Exam: There were no vitals taken for this visit. General:   Alert and oriented. Pleasant and cooperative. Well-nourished and well-developed.  Head:  Normocephalic and atraumatic. Eyes:  Without icterus, sclera clear and conjunctiva pink.  Ears:  Normal auditory acuity. Mouth:  No deformity or lesions,  oral mucosa pink.  Throat/Neck:  Supple, without mass or thyromegaly. Cardiovascular:  S1, S2 present without murmurs appreciated. Normal pulses noted. Extremities without clubbing or edema. Respiratory:  Clear to auscultation bilaterally. No wheezes, rales, or rhonchi. No distress.  Gastrointestinal:  +BS, soft, non-tender and non-distended. No HSM noted. No guarding or rebound. No masses appreciated.  Rectal:  Deferred  Musculoskalatal:  Symmetrical without gross deformities. Normal posture. Skin:  Intact without significant lesions or rashes. Neurologic:  Alert and oriented x4;  grossly normal neurologically. Psych:  Alert and cooperative. Normal mood and affect. Heme/Lymph/Immune: No significant cervical adenopathy. No excessive bruising noted.    04/09/2017 1:31 PM   Disclaimer: This note was dictated with voice recognition software. Similar sounding words can inadvertently be transcribed and may not be corrected upon review.

## 2017-04-09 NOTE — Telephone Encounter (Signed)
Patient was a no show and letter sent  °

## 2017-04-10 NOTE — Telephone Encounter (Signed)
Noted  

## 2017-06-16 ENCOUNTER — Encounter: Payer: Self-pay | Admitting: Nurse Practitioner

## 2017-06-16 ENCOUNTER — Ambulatory Visit: Payer: Medicare Other | Admitting: Nurse Practitioner

## 2017-06-16 DIAGNOSIS — R110 Nausea: Secondary | ICD-10-CM | POA: Diagnosis not present

## 2017-06-16 DIAGNOSIS — R197 Diarrhea, unspecified: Secondary | ICD-10-CM

## 2017-06-16 DIAGNOSIS — R112 Nausea with vomiting, unspecified: Secondary | ICD-10-CM | POA: Insufficient documentation

## 2017-06-16 MED ORDER — ONDANSETRON HCL 4 MG PO TABS: 4.0000 mg | ORAL_TABLET | Freq: Three times a day (TID) | ORAL | 1 refills | Status: DC | PRN

## 2017-06-16 NOTE — Patient Instructions (Signed)
1. Bring your stool studies to the lab, not our office. 2. We will call you with the results.  If they are negative we can send in a prescription if you are still having worsening diarrhea. 3. I am sending in Zofran 4 mg that she can take up to every 8 hours as needed for nausea. 4. Return for follow-up in 2 months. 5. Call with any questions or concerns.

## 2017-06-16 NOTE — Assessment & Plan Note (Signed)
She notes intermittent nausea, typically before episodes of diarrhea.  Does not appear to be linked to when she takes her current medications.  No recent travel or diet changes.  She is previously had esophageal surgery for hiatal hernia and persistent GERD.  She does not generally have vomiting with her nausea.  She may dry heave however.  At this point I will send in Zofran 4 mg every 8 hours as needed for nausea.  Return for follow-up in 2 months.  I suspect if we can get her diarrhea improved her nausea will improve as well.

## 2017-06-16 NOTE — Assessment & Plan Note (Signed)
Patient has what appears to be an acute flare of chronic diarrhea.  States she has irritable bowel syndrome for a number of years.  Her diarrhea began worsening about 3 months ago.  She is unable to leave the house without Imodium on hand.  She may have 1 or 2 days of a symptomatic bowel movements occasionally, but otherwise has 4-5 loose watery stools a day.  She is not currently on anything for irritable bowel syndrome.  At this point I will check stool studies for completeness.  If they are negative we can send in Bentyl 10 mg 4 times a day as needed.  Return for follow-up in 2 months.  Denies any red flag or warning signs or symptoms.  If diarrhea is persistent despite medications, can consider colonoscopy for further evaluation.  She is not a candidate for Viberzi because she has had her gallbladder removed.

## 2017-06-16 NOTE — Progress Notes (Signed)
Primary Care Physician:  Lemmie Evens, MD Primary Gastroenterologist:  Dr. Gala Romney  Chief Complaint  Patient presents with  . Diarrhea    3-4 times per day or more. Can skip a few days w/o having any  . Nausea    x 8-9 months    HPI:   [name] is a 84 y.o. female who presents on referral from her primary care provider for persistent diarrhea.  No history of colonoscopy or endoscopy in our system.  Today she states she has had chronic diarrhea for years, hitory of IBS. Was doing better until about 3 months ago. Takes probiotics, eats yogurt, trigger avoidance. Unsure of why she's started having worsening symptoms. Having diarrhea about 4-6 loose stools a day which are often watery. Takes Imodium to help. Will sometimes have no symptoms 1-2 days. Diarrhea days happen several times a week. Also having nausea which occurs often before diarrhea; no trigger related to medications. History of esophageal surgery for persistent GERD/hiatal hernia. Is now also having vomiting with her nausea. Has heaving several times a week. Intermittent/rare GERD symptoms which resolves with OTC medication. Denies hematochezia, melena, fever, chills, recent travel, dietary changes, unintentional weight loss. Doesn't know if she's drinking enough water, but she's trying.  Last colonoscopy 2001 which, per the patient, only found diverticula. Currently she is past the age of routine screening exams.  Past Medical History:  Diagnosis Date  . Ankle swelling    takes torsemide prn  . Anxiety   . Arthritis   . Asthma    allergy induced  . Back pain   . Complication of anesthesia   . Depression   . GERD (gastroesophageal reflux disease)   . Hypercholesterolemia   . Hypothyroid   . Parkinson disease ([place])   . PONV (postoperative nausea and vomiting)   . Seasonal allergies     Past Surgical History:  Procedure Laterality Date  . ABDOMINAL HYSTERECTOMY    . CARPAL TUNNEL RELEASE Right 10/26/2015   Procedure: CARPAL TUNNEL RELEASE;  Surgeon: Daryll Brod, MD;  Location: [place];  Service: Orthopedics;  Laterality: Right;  . CARPOMETACARPEL SUSPENSION PLASTY Right 10/26/2015   Procedure: RIGHT HAND TRAPEZIECTOMY WITH THUMB METACARPEL SUSPENSION PLASTY;  Surgeon: Daryll Brod, MD;  Location: [place];  Service: Orthopedics;  Laterality: Right;  . CATARACT EXTRACTION W/PHACO  12/23/2011   Procedure: CATARACT EXTRACTION PHACO AND INTRAOCULAR LENS PLACEMENT (IOC);  Surgeon: Williams Che, MD;  Location: [facility];  Service: Ophthalmology;  Laterality: Right;  CDE=5.16  . CHOLECYSTECTOMY    . EYE SURGERY     cataract extraction of left eye-[facility]-2002  . KNEE ARTHROSCOPY     right  . NISSEN FUNDOPLICATION     [facility]  . TRIGGER FINGER RELEASE Right 10/26/2015   Procedure: RELEASE A-1 PULLEY RIGHT RING FINGER;  Surgeon: Daryll Brod, MD;  Location: [place];  Service: Orthopedics;  Laterality: Right;    Current Outpatient Medications  Medication Sig Dispense Refill  . albuterol (PROVENTIL HFA;VENTOLIN HFA) 108 (90 BASE) MCG/ACT inhaler Inhale 2 puffs into the lungs every 6 (six) hours as needed. For shortness of breath    . albuterol (PROVENTIL) (2.5 MG/3ML) 0.083% nebulizer solution Take 2.5 mg by nebulization every 6 (six) hours as needed for wheezing or shortness of breath.    . ALPRAZolam (XANAX) 0.25 MG tablet Take 1 tablet (0.25 mg total) by mouth at bedtime. (Patient taking differently: Take 0.5 mg by mouth at  bedtime. ) 30 tablet 5  . Biotin 10 MG CAPS Take 1 capsule by mouth daily.     Marland Kitchen bismuth subsalicylate (PEPTO BISMOL) 262 MG/15ML suspension Take 30 mLs by mouth as needed.    . carbidopa-levodopa (SINEMET IR) 25-100 MG tablet Take 1 tablet by mouth 3 (three) times daily. 270 tablet 1  . cetirizine-pseudoephedrine (ZYRTEC-D) 5-120 MG per tablet Take 1 tablet by mouth daily. For sinus     . Cholecalciferol (D3-1000 PO) Take by mouth daily.      Marland Kitchen escitalopram (LEXAPRO) 5 MG tablet Take 10 mg by mouth daily.    . Lactobacillus (FLORAJEN ACIDOPHILUS PO) Take 1 tablet by mouth daily.    Marland Kitchen levothyroxine (SYNTHROID, LEVOTHROID) 25 MCG tablet Take 25 mcg by mouth daily.    Marland Kitchen loperamide (IMODIUM) 2 MG capsule Take by mouth as needed for diarrhea or loose stools.    . mometasone (NASONEX) 50 MCG/ACT nasal spray Place 2 sprays into the nose 2 (two) times daily as needed. For allergies    . montelukast (SINGULAIR) 10 MG tablet Take 10 mg by mouth at bedtime.      . Multiple Vitamins-Minerals (CAL-DAY 1000 PO) Take by mouth daily.    . naproxen (NAPROSYN) 500 MG tablet Take 500 mg by mouth 2 (two) times daily with a meal.    . OVER THE COUNTER MEDICATION FD gard 2 times a day as needed 1b gard as needed    . potassium chloride (MICRO-K) 10 MEQ CR capsule Take 20 mEq by mouth daily.     Marland Kitchen QVAR REDIHALER 80 MCG/ACT AERB INHALATION TAKE 2 PUFFS DAILY TO PREVENT BREATHING PROBLEMS.  7  . torsemide (DEMADEX) 10 MG tablet Take 10 mg by mouth daily. 1/2 to 1 tab daily    . mometasone-formoterol (DULERA) 100-5 MCG/ACT AERO Inhale 2 puffs into the lungs 2 (two) times daily.    . ondansetron (ZOFRAN) 4 MG tablet Take 1 tablet (4 mg total) by mouth every 8 (eight) hours as needed for nausea or vomiting. 30 tablet 1  . sertraline (ZOLOFT) 100 MG tablet Take 50 mg by mouth daily.      No current facility-administered medications for this visit.     Allergies as of 06/16/2017 - Review Complete 06/16/2017  Allergen Reaction Noted  . Cephalosporins Rash 10/19/2010  . Latex Rash 01/17/2011  . Lactose intolerance (gi) Nausea And Vomiting 12/09/2011  . Lactulose Nausea And Vomiting 06/15/2015  . Cephalexin Rash 06/15/2015  . Meperidine and related Nausea And Vomiting 10/19/2010  . Omeprazole Rash 06/15/2015    Family History  Problem Relation Age of Onset  . Stroke Mother   . COPD Father   . Stroke Father     Social History   Socioeconomic  History  . Marital status: Married    Spouse name: Not on file  . Number of children: 2  . Years of education: Not on file  . Highest education level: Not on file  Social Needs  . Financial resource strain: Not on file  . Food insecurity - worry: Not on file  . Food insecurity - inability: Not on file  . Transportation needs - medical: Not on file  . Transportation needs - non-medical: Not on file  Occupational History    Comment: retired  Tobacco Use  . Smoking status: Never Smoker  . Smokeless tobacco: Never Used  Substance and Sexual Activity  . Alcohol use: Yes    Alcohol/week: 0.6 oz    Types:  1 Glasses of wine per week    Comment: occ  . Drug use: No  . Sexual activity: Not on file  Other Topics Concern  . Not on file  Social History Narrative   Patient lives at home alone and she is widowed.   Retired.    Education one year business course.   Right handed.   Caffeine two cup of coffee daily and one sweet tea.    Review of Systems: General: Negative for anorexia, weight loss, fever, chills, fatigue, weakness. ENT: Negative for hoarseness, difficulty swallowing , nasal congestion. CV: Negative for chest pain, angina, palpitations, dyspnea on exertion, peripheral edema.  Respiratory: Negative for dyspnea at rest, dyspnea on exertion, cough, sputum, wheezing.  GI: See history of present illness. MS: Admits back pain.  Derm: Negative for rash or itching.   Endo: Negative for unusual weight change.  Heme: Negative for bruising or bleeding. Allergy: Negative for rash or hives.    Physical Exam: BP 110/72   Pulse 92   Temp (!) 97.3 F (36.3 C) (Oral)   Ht 5' 3"  (1.6 m)   Wt 159 lb 3.2 oz (72.2 kg)   BMI 28.20 kg/m  General:   Alert and oriented. Pleasant and cooperative. Well-nourished and well-developed.  Eyes:  Without icterus, sclera clear and conjunctiva pink.  Ears:  Normal auditory acuity. Cardiovascular:  S1, S2 present without murmurs appreciated.  Extremities without clubbing or edema. Respiratory:  Clear to auscultation bilaterally. No wheezes, rales, or rhonchi. No distress.  Gastrointestinal:  +BS, soft, and non-distended. Mild lower abdominal TTP. No HSM noted. No guarding or rebound. No masses appreciated.  Rectal:  Deferred  Musculoskalatal:  Symmetrical without gross deformities. Neurologic:  Alert and oriented x4;  grossly normal neurologically. Psych:  Alert and cooperative. Normal mood and affect. Heme/Lymph/Immune: No excessive bruising noted.    06/16/2017 11:51 AM   Disclaimer: This note was dictated with voice recognition software. Similar sounding words can inadvertently be transcribed and may not be corrected upon review.

## 2017-06-16 NOTE — Progress Notes (Signed)
CC'ED TO PCP 

## 2017-07-24 LAB — GASTROINTESTINAL PATHOGEN PANEL PCR
C. difficile Tox A/B, PCR: NOT DETECTED
CAMPYLOBACTER, PCR: NOT DETECTED
Cryptosporidium, PCR: NOT DETECTED
E coli (ETEC) LT/ST PCR: NOT DETECTED
E coli (STEC) stx1/stx2, PCR: NOT DETECTED
E coli 0157, PCR: NOT DETECTED
GIARDIA LAMBLIA, PCR: NOT DETECTED
Norovirus, PCR: NOT DETECTED
ROTAVIRUS, PCR: NOT DETECTED
SALMONELLA, PCR: DETECTED — AB
SHIGELLA, PCR: NOT DETECTED

## 2017-07-24 LAB — CLOSTRIDIUM DIFFICILE TOXIN B, QUALITATIVE, REAL-TIME PCR: Toxigenic C. Difficile by PCR: NOT DETECTED

## 2017-07-25 ENCOUNTER — Other Ambulatory Visit: Payer: Self-pay | Admitting: Nurse Practitioner

## 2017-07-25 DIAGNOSIS — A029 Salmonella infection, unspecified: Secondary | ICD-10-CM

## 2017-07-25 MED ORDER — CIPROFLOXACIN HCL 500 MG PO TABS: 500.0000 mg | ORAL_TABLET | Freq: Two times a day (BID) | ORAL | 0 refills | Status: AC

## 2017-07-25 NOTE — Progress Notes (Signed)
Rx sent to local pharmacy per patient request.

## 2017-08-18 ENCOUNTER — Ambulatory Visit: Payer: Medicare Other | Admitting: Nurse Practitioner

## 2017-08-18 NOTE — Progress Notes (Deleted)
Referring Provider: Lemmie Evens, MD Primary Care Physician:  Lemmie Evens, MD Primary GI:  Dr. Gala Romney  No chief complaint on file.   HPI:   [name] is a 84 y.o. female who presents for follow-up on diarrhea.  The patient was last seen in our office 06/16/2017 for diarrhea and nausea.  Noted history of chronic diarrhea for a number of years, history of IBS.  Was doing well until 3 months prior to her last visit.  On probiotics.  No triggers that she can identify.  Diarrhea was occurring 4-6 times a day with loose, watery stools.  Imodium to help.  Occasionally goes 1 or 2 days without symptoms.  This happens several times a week.  Also nausea that occurs before her diarrhea without known triggers.  Status post esophageal surgery for persistent GERD/hiatal hernia.  Has a dry heaves several times a week.  Intermittent/rare GERD symptoms which resolves with over-the-counter rescue medicines.  No other GI symptoms.  Previous colonoscopy 2001 which the patient states only found diverticula and not due for routine screening due to age.  Recommended stool studies.  If negative, can consider prescription treatment of diarrhea.  Zofran sent to the pharmacy.  Follow-up in 2 months.  Her stool studies were negative for C. difficile, but positive for Salmonella.  We elected to treat her with Cipro 500 mg twice a day for 7 days because she is over age 53 and noted prolonged duration of symptoms.  Today she states   Past Medical History:  Diagnosis Date  . Ankle swelling    takes torsemide prn  . Anxiety   . Arthritis   . Asthma    allergy induced  . Back pain   . Complication of anesthesia   . Depression   . GERD (gastroesophageal reflux disease)   . Hypercholesterolemia   . Hypothyroid   . Parkinson disease ([place])   . PONV (postoperative nausea and vomiting)   . Seasonal allergies     Past Surgical History:  Procedure Laterality Date  . ABDOMINAL HYSTERECTOMY    . CARPAL TUNNEL  RELEASE Right 10/26/2015   Procedure: CARPAL TUNNEL RELEASE;  Surgeon: Daryll Brod, MD;  Location: [place];  Service: Orthopedics;  Laterality: Right;  . CARPOMETACARPEL SUSPENSION PLASTY Right 10/26/2015   Procedure: RIGHT HAND TRAPEZIECTOMY WITH THUMB METACARPEL SUSPENSION PLASTY;  Surgeon: Daryll Brod, MD;  Location: [place];  Service: Orthopedics;  Laterality: Right;  . CATARACT EXTRACTION W/PHACO  12/23/2011   Procedure: CATARACT EXTRACTION PHACO AND INTRAOCULAR LENS PLACEMENT (IOC);  Surgeon: Williams Che, MD;  Location: [facility];  Service: Ophthalmology;  Laterality: Right;  CDE=5.16  . CHOLECYSTECTOMY    . EYE SURGERY     cataract extraction of left eye-[facility]-2002  . KNEE ARTHROSCOPY     right  . NISSEN FUNDOPLICATION     [facility]  . TRIGGER FINGER RELEASE Right 10/26/2015   Procedure: RELEASE A-1 PULLEY RIGHT RING FINGER;  Surgeon: Daryll Brod, MD;  Location: [place];  Service: Orthopedics;  Laterality: Right;    Current Outpatient Medications  Medication Sig Dispense Refill  . albuterol (PROVENTIL HFA;VENTOLIN HFA) 108 (90 BASE) MCG/ACT inhaler Inhale 2 puffs into the lungs every 6 (six) hours as needed. For shortness of breath    . albuterol (PROVENTIL) (2.5 MG/3ML) 0.083% nebulizer solution Take 2.5 mg by nebulization every 6 (six) hours as needed for wheezing or shortness of breath.    . ALPRAZolam (  XANAX) 0.25 MG tablet Take 1 tablet (0.25 mg total) by mouth at bedtime. (Patient taking differently: Take 0.5 mg by mouth at bedtime. ) 30 tablet 5  . Biotin 10 MG CAPS Take 1 capsule by mouth daily.     Marland Kitchen bismuth subsalicylate (PEPTO BISMOL) 262 MG/15ML suspension Take 30 mLs by mouth as needed.    . carbidopa-levodopa (SINEMET IR) 25-100 MG tablet Take 1 tablet by mouth 3 (three) times daily. 270 tablet 1  . cetirizine-pseudoephedrine (ZYRTEC-D) 5-120 MG per tablet Take 1 tablet by mouth daily. For sinus     . Cholecalciferol (D3-1000  PO) Take by mouth daily.    Marland Kitchen escitalopram (LEXAPRO) 5 MG tablet Take 10 mg by mouth daily.    . Lactobacillus (FLORAJEN ACIDOPHILUS PO) Take 1 tablet by mouth daily.    Marland Kitchen levothyroxine (SYNTHROID, LEVOTHROID) 25 MCG tablet Take 25 mcg by mouth daily.    Marland Kitchen loperamide (IMODIUM) 2 MG capsule Take by mouth as needed for diarrhea or loose stools.    . mometasone (NASONEX) 50 MCG/ACT nasal spray Place 2 sprays into the nose 2 (two) times daily as needed. For allergies    . mometasone-formoterol (DULERA) 100-5 MCG/ACT AERO Inhale 2 puffs into the lungs 2 (two) times daily.    . montelukast (SINGULAIR) 10 MG tablet Take 10 mg by mouth at bedtime.      . Multiple Vitamins-Minerals (CAL-DAY 1000 PO) Take by mouth daily.    . naproxen (NAPROSYN) 500 MG tablet Take 500 mg by mouth 2 (two) times daily with a meal.    . ondansetron (ZOFRAN) 4 MG tablet Take 1 tablet (4 mg total) by mouth every 8 (eight) hours as needed for nausea or vomiting. 30 tablet 1  . OVER THE COUNTER MEDICATION FD gard 2 times a day as needed 1b gard as needed    . potassium chloride (MICRO-K) 10 MEQ CR capsule Take 20 mEq by mouth daily.     Marland Kitchen QVAR REDIHALER 80 MCG/ACT AERB INHALATION TAKE 2 PUFFS DAILY TO PREVENT BREATHING PROBLEMS.  7  . sertraline (ZOLOFT) 100 MG tablet Take 50 mg by mouth daily.     Marland Kitchen torsemide (DEMADEX) 10 MG tablet Take 10 mg by mouth daily. 1/2 to 1 tab daily     No current facility-administered medications for this visit.     Allergies as of 08/18/2017 - Review Complete 06/16/2017  Allergen Reaction Noted  . Cephalosporins Rash 10/19/2010  . Latex Rash 01/17/2011  . Lactose intolerance (gi) Nausea And Vomiting 12/09/2011  . Lactulose Nausea And Vomiting 06/15/2015  . Cephalexin Rash 06/15/2015  . Meperidine and related Nausea And Vomiting 10/19/2010  . Omeprazole Rash 06/15/2015    Family History  Problem Relation Age of Onset  . Stroke Mother   . COPD Father   . Stroke Father     Social  History   Socioeconomic History  . Marital status: Married    Spouse name: Not on file  . Number of children: 2  . Years of education: Not on file  . Highest education level: Not on file  Social Needs  . Financial resource strain: Not on file  . Food insecurity - worry: Not on file  . Food insecurity - inability: Not on file  . Transportation needs - medical: Not on file  . Transportation needs - non-medical: Not on file  Occupational History    Comment: retired  Tobacco Use  . Smoking status: Never Smoker  . Smokeless tobacco:  Never Used  Substance and Sexual Activity  . Alcohol use: Yes    Alcohol/week: 0.6 oz    Types: 1 Glasses of wine per week    Comment: occ  . Drug use: No  . Sexual activity: Not on file  Other Topics Concern  . Not on file  Social History Narrative   Patient lives at home alone and she is widowed.   Retired.    Education one year business course.   Right handed.   Caffeine two cup of coffee daily and one sweet tea.    Review of Systems: General: Negative for anorexia, weight loss, fever, chills, fatigue, weakness. Eyes: Negative for vision changes.  ENT: Negative for hoarseness, difficulty swallowing , nasal congestion. CV: Negative for chest pain, angina, palpitations, dyspnea on exertion, peripheral edema.  Respiratory: Negative for dyspnea at rest, dyspnea on exertion, cough, sputum, wheezing.  GI: See history of present illness. GU:  Negative for dysuria, hematuria, urinary incontinence, urinary frequency, nocturnal urination.  MS: Negative for joint pain, low back pain.  Derm: Negative for rash or itching.  Neuro: Negative for weakness, abnormal sensation, seizure, frequent headaches, memory loss, confusion.  Psych: Negative for anxiety, depression, suicidal ideation, hallucinations.  Endo: Negative for unusual weight change.  Heme: Negative for bruising or bleeding. Allergy: Negative for rash or hives.   Physical Exam: There were no  vitals taken for this visit. General:   Alert and oriented. Pleasant and cooperative. Well-nourished and well-developed.  Head:  Normocephalic and atraumatic. Eyes:  Without icterus, sclera clear and conjunctiva pink.  Ears:  Normal auditory acuity. Mouth:  No deformity or lesions, oral mucosa pink.  Throat/Neck:  Supple, without mass or thyromegaly. Cardiovascular:  S1, S2 present without murmurs appreciated. Normal pulses noted. Extremities without clubbing or edema. Respiratory:  Clear to auscultation bilaterally. No wheezes, rales, or rhonchi. No distress.  Gastrointestinal:  +BS, soft, non-tender and non-distended. No HSM noted. No guarding or rebound. No masses appreciated.  Rectal:  Deferred  Musculoskalatal:  Symmetrical without gross deformities. Normal posture. Skin:  Intact without significant lesions or rashes. Neurologic:  Alert and oriented x4;  grossly normal neurologically. Psych:  Alert and cooperative. Normal mood and affect. Heme/Lymph/Immune: No significant cervical adenopathy. No excessive bruising noted.    08/18/2017 11:22 AM   Disclaimer: This note was dictated with voice recognition software. Similar sounding words can inadvertently be transcribed and may not be corrected upon review.

## 2017-10-08 ENCOUNTER — Encounter: Payer: Self-pay | Admitting: Nurse Practitioner

## 2017-10-08 ENCOUNTER — Telehealth: Payer: Self-pay | Admitting: Nurse Practitioner

## 2017-10-20 ENCOUNTER — Ambulatory Visit: Payer: Medicare Other | Admitting: Nurse Practitioner

## 2017-11-06 ENCOUNTER — Encounter: Payer: Self-pay | Admitting: Nurse Practitioner

## 2017-11-06 ENCOUNTER — Ambulatory Visit: Payer: Medicare Other | Admitting: Nurse Practitioner

## 2017-11-06 ENCOUNTER — Encounter

## 2017-11-06 VITALS — BP 142/82 | HR 86 | Temp 97.0°F | Ht 62.0 in | Wt 160.6 lb

## 2017-11-06 DIAGNOSIS — R197 Diarrhea, unspecified: Secondary | ICD-10-CM | POA: Diagnosis not present

## 2017-11-06 DIAGNOSIS — K2100 Gastro-esophageal reflux disease with esophagitis, without bleeding: Secondary | ICD-10-CM

## 2017-11-06 MED ORDER — DICYCLOMINE HCL 10 MG PO CAPS: 10.0000 mg | ORAL_CAPSULE | Freq: Two times a day (BID) | ORAL | 2 refills | Status: DC | PRN

## 2017-11-06 MED ORDER — RANITIDINE HCL 150 MG PO TABS: 150.0000 mg | ORAL_TABLET | Freq: Two times a day (BID) | ORAL | 3 refills | Status: DC

## 2017-11-06 NOTE — Progress Notes (Signed)
Referring Provider: Lemmie Evens, MD Primary Care Physician:  Lemmie Evens, MD Primary GI:  Dr. Gala Romney  Chief Complaint  Patient presents with  . Diarrhea    1-2 TIMES PER DAY  . belching    HPI:   [name] is a 84 y.o. female who presents for follow-up on diarrhea.  The patient was last seen in our office 06/16/2017 for the same as well as nausea and vomiting.  At that time it was noted no history of colonoscopy or endoscopy in our system.  Chronic diarrhea for a number of years, history of IBS.  Was doing better until 3 months prior.  She takes probiotics and avoids triggers.  At her last visit she indicated diarrhea 4-6 loose stools a day which are often watery, Imodium helps.  Sometimes no symptoms for 1 or 2 days.  She typically has symptoms several times a week.  Also with nausea which occurs often before the diarrhea and no trigger related to medications.  She has had esophageal surgery for persistent GERD and hiatal hernia.  Has heaving several times a week.  Intermittent/rare GERD symptoms which resolved with over-the-counter medications.  Last colonoscopy 2001 which only found diverticula and was told she is past the age of routine screening exams.  Recommended stool studies to rule out occult infection, Zofran every 8 hours as needed, follow-up in 2 months.  Stool studies were completed 07/21/2017 which was negative for C. difficile but positive for Salmonella.  Given her age and prolonged duration of symptoms we elected to treat her with Cipro 500 mg twice daily for 7 days.  Today she states she's doing ok overall. She took all her antibiotics. This did not help her diarrhea. Her PCP has started her on Questran which has helped. Having 1-2 bowel movements a day, they're "trying to have a little more texture at times, but I will have a bout of loose stools." Has had her gallbladder out. Her chronic IBS baseline is daily bowel movement which was mostly formed with occasional  flare that was helped by Imodium. She does take Protiotics, eats yogurt daily as well. Has some abdominal pain, sometimes resolves after a bowel movement. Denies hematochezia, melena. Has intermittent nausea, Zofran is too strong for her (it "knocks me out"). Has had hiatal hernia surgery before. Thinks she may have a hiatal hernia again. Not on a PPI currently. Denies fever, chills, unintentional weight loss. Denies chest pain, dyspnea, dizziness, lightheadedness, syncope, near syncope. Denies any other upper or lower GI symptoms.  Past Medical History:  Diagnosis Date  . Ankle swelling    takes torsemide prn  . Anxiety   . Arthritis   . Asthma    allergy induced  . Back pain   . Complication of anesthesia   . Depression   . GERD (gastroesophageal reflux disease)   . Hypercholesterolemia   . Hypothyroid   . Parkinson disease ([place])   . PONV (postoperative nausea and vomiting)   . Seasonal allergies     Past Surgical History:  Procedure Laterality Date  . ABDOMINAL HYSTERECTOMY    . CARPAL TUNNEL RELEASE Right 10/26/2015   Procedure: CARPAL TUNNEL RELEASE;  Surgeon: Daryll Brod, MD;  Location: [place];  Service: Orthopedics;  Laterality: Right;  . CARPOMETACARPEL SUSPENSION PLASTY Right 10/26/2015   Procedure: RIGHT HAND TRAPEZIECTOMY WITH THUMB METACARPEL SUSPENSION PLASTY;  Surgeon: Daryll Brod, MD;  Location: [place];  Service: Orthopedics;  Laterality: Right;  .  CATARACT EXTRACTION W/PHACO  12/23/2011   Procedure: CATARACT EXTRACTION PHACO AND INTRAOCULAR LENS PLACEMENT (IOC);  Surgeon: Williams Che, MD;  Location: [facility];  Service: Ophthalmology;  Laterality: Right;  CDE=5.16  . CHOLECYSTECTOMY    . EYE SURGERY     cataract extraction of left eye-[facility]-2002  . KNEE ARTHROSCOPY     right  . NISSEN FUNDOPLICATION     [facility]  . TRIGGER FINGER RELEASE Right 10/26/2015   Procedure: RELEASE A-1 PULLEY RIGHT RING FINGER;  Surgeon: Daryll Brod, MD;   Location: [place];  Service: Orthopedics;  Laterality: Right;    Current Outpatient Medications  Medication Sig Dispense Refill  . albuterol (PROVENTIL HFA;VENTOLIN HFA) 108 (90 BASE) MCG/ACT inhaler Inhale 2 puffs into the lungs every 6 (six) hours as needed. For shortness of breath    . albuterol (PROVENTIL) (2.5 MG/3ML) 0.083% nebulizer solution Take 2.5 mg by nebulization every 6 (six) hours as needed for wheezing or shortness of breath.    . ALPRAZolam (XANAX) 0.25 MG tablet Take 1 tablet (0.25 mg total) by mouth at bedtime. (Patient taking differently: Take 0.5 mg by mouth at bedtime. ) 30 tablet 5  . Biotin 10 MG CAPS Take 1 capsule by mouth daily.     Marland Kitchen bismuth subsalicylate (PEPTO BISMOL) 262 MG/15ML suspension Take 30 mLs by mouth as needed.    . carbidopa-levodopa (SINEMET IR) 25-100 MG tablet Take 1 tablet by mouth 3 (three) times daily. 270 tablet 1  . cetirizine-pseudoephedrine (ZYRTEC-D) 5-120 MG per tablet Take 1 tablet by mouth daily. For sinus     . Cholecalciferol (D3-1000 PO) Take by mouth daily.    . cholestyramine (QUESTRAN) 4 GM/DOSE powder Take by mouth 2 (two) times daily with a meal.    . escitalopram (LEXAPRO) 5 MG tablet Take 10 mg by mouth daily.    . Lactobacillus (FLORAJEN ACIDOPHILUS PO) Take 1 tablet by mouth daily.    Marland Kitchen levothyroxine (SYNTHROID, LEVOTHROID) 25 MCG tablet Take 25 mcg by mouth daily.    Marland Kitchen loperamide (IMODIUM) 2 MG capsule Take by mouth as needed for diarrhea or loose stools.    . mometasone (NASONEX) 50 MCG/ACT nasal spray Place 2 sprays into the nose 2 (two) times daily as needed. For allergies    . mometasone-formoterol (DULERA) 100-5 MCG/ACT AERO Inhale 2 puffs into the lungs 2 (two) times daily.    . montelukast (SINGULAIR) 10 MG tablet Take 10 mg by mouth at bedtime.      . Multiple Vitamins-Minerals (CAL-DAY 1000 PO) Take by mouth daily.    . naproxen (NAPROSYN) 500 MG tablet Take 500 mg by mouth 2 (two) times daily with a  meal.    . ondansetron (ZOFRAN) 4 MG tablet Take 1 tablet (4 mg total) by mouth every 8 (eight) hours as needed for nausea or vomiting. 30 tablet 1  . OVER THE COUNTER MEDICATION FD gard 2 times a day as needed 1b gard as needed    . potassium chloride (MICRO-K) 10 MEQ CR capsule Take 20 mEq by mouth daily.     Marland Kitchen QVAR REDIHALER 80 MCG/ACT AERB INHALATION TAKE 2 PUFFS DAILY TO PREVENT BREATHING PROBLEMS.  7  . sertraline (ZOLOFT) 100 MG tablet Take 50 mg by mouth daily.     Marland Kitchen torsemide (DEMADEX) 10 MG tablet Take 10 mg by mouth daily. 1/2 to 1 tab daily     No current facility-administered medications for this visit.     Allergies as  of 11/06/2017 - Review Complete 11/06/2017  Allergen Reaction Noted  . Cephalosporins Rash 10/19/2010  . Latex Rash 01/17/2011  . Lactose intolerance (gi) Nausea And Vomiting 12/09/2011  . Lactulose Nausea And Vomiting 06/15/2015  . Cephalexin Rash 06/15/2015  . Meperidine and related Nausea And Vomiting 10/19/2010  . Omeprazole Rash 06/15/2015    Family History  Problem Relation Age of Onset  . Stroke Mother   . COPD Father   . Stroke Father   . Colon cancer Neg Hx     Social History   Socioeconomic History  . Marital status: Married    Spouse name: Not on file  . Number of children: 2  . Years of education: Not on file  . Highest education level: Not on file  Occupational History    Comment: retired  Scientific laboratory technician  . Financial resource strain: Not on file  . Food insecurity:    Worry: Not on file    Inability: Not on file  . Transportation needs:    Medical: Not on file    Non-medical: Not on file  Tobacco Use  . Smoking status: Never Smoker  . Smokeless tobacco: Never Used  Substance and Sexual Activity  . Alcohol use: Yes    Alcohol/week: 0.6 oz    Types: 1 Glasses of wine per week    Comment: occ  . Drug use: No  . Sexual activity: Not on file  Lifestyle  . Physical activity:    Days per week: Not on file    Minutes per  session: Not on file  . Stress: Not on file  Relationships  . Social connections:    Talks on phone: Not on file    Gets together: Not on file    Attends religious service: Not on file    Active member of club or organization: Not on file    Attends meetings of clubs or organizations: Not on file    Relationship status: Not on file  Other Topics Concern  . Not on file  Social History Narrative   Patient lives at home alone and she is widowed.   Retired.    Education one year business course.   Right handed.   Caffeine two cup of coffee daily and one sweet tea.    Review of Systems: General: Negative for anorexia, weight loss, fever, chills, fatigue, weakness. ENT: Negative for hoarseness, difficulty swallowing , nasal congestion. CV: Negative for chest pain, angina, palpitations, dyspnea on exertion, peripheral edema.  Respiratory: Negative for dyspnea at rest, dyspnea on exertion, cough, sputum, wheezing.  GI: See history of present illness. Endo: Negative for unusual weight change.  Heme: Negative for bruising or bleeding.   Physical Exam: BP (!) 142/82   Pulse 86   Temp (!) 97 F (36.1 C) (Oral)   Ht 5' 2"  (1.575 m)   Wt 160 lb 9.6 oz (72.8 kg)   BMI 29.37 kg/m  General:   Alert and oriented. Pleasant and cooperative. Well-nourished and well-developed.  Eyes:  Without icterus, sclera clear and conjunctiva pink.  Ears:  Normal auditory acuity. Cardiovascular:  S1, S2 present without murmurs appreciated. Extremities without clubbing or edema. Respiratory:  Clear to auscultation bilaterally. No wheezes, rales, or rhonchi. No distress.  Gastrointestinal:  +BS, soft, non-tender and non-distended. No HSM noted. No guarding or rebound. No masses appreciated.  Rectal:  Deferred  Musculoskalatal:  Symmetrical without gross deformities. Neurologic:  Alert and oriented x4;  grossly normal neurologically. Psych:  Alert and  cooperative. Normal mood and  affect. Heme/Lymph/Immune: No excessive bruising noted.    11/06/2017 11:53 AM   Disclaimer: This note was dictated with voice recognition software. Similar sounding words can inadvertently be transcribed and may not be corrected upon review.

## 2017-11-06 NOTE — Assessment & Plan Note (Signed)
Diarrhea is improved but persistent.  She does not feel antibiotics helped her diarrhea.  She is started taking Questran and this has helped some.  Long-standing history of IBS diarrhea type.  She was doing well at baseline with mostly formed stools and intermittent flares which resolved with Imodium.  He began having her acute symptoms she was having diarrhea 3-4 times a day.  She is now having diarrhea about 1-2 times a day.  She remains on Questran and I recommend she continue this.  Continue probiotics.  I will trial her on Bentyl 10 mg twice daily as needed to see if this helps her symptoms.  Follow-up in 2 months.

## 2017-11-06 NOTE — Progress Notes (Signed)
cc'ed to pcp °

## 2017-11-06 NOTE — Patient Instructions (Signed)
1. I have sent in a prescription for Zantac 150 mg.  Take this once a day on an empty stomach. 2. Continue taking Questran and probiotics. 3. I sent a prescription for Bentyl 10 mg to your pharmacy.  Take this up to twice daily, as needed for abdominal pain and/or diarrhea. 4. Return for follow-up in 2 months. 5. Call us if you have any worsening symptoms before then. 6. Call us if you have any questions or concerns.  At [facility] we value your feedback. You may receive a survey about your visit today. Please share your experience as we strive to create trusting relationships with our patients to provide genuine, compassionate, quality care.  It was good to see you today!  I hope you have a wonderful summer!!

## 2017-11-06 NOTE — Assessment & Plan Note (Addendum)
History of GERD symptoms.  She has a history of a very large hiatal hernia which was resected.  She is not having nausea and vomiting which she has not had for some time.  She is not currently on a PPI.  Query exacerbation of previously quite GERD symptoms.  She has a documented allergy to omeprazole (rash).  I will trial her on Zantac 150 milligrams daily if this is ineffective we can consider trial of a PPI, possibly pantoprazole.  I will trial her on Prilosec 20 mg daily to see if this helps.  Return for follow-up in 2 months.

## 2017-12-03 ENCOUNTER — Other Ambulatory Visit: Payer: Self-pay | Admitting: Neurology

## 2018-01-31 ENCOUNTER — Other Ambulatory Visit: Payer: Self-pay | Admitting: Nurse Practitioner

## 2018-01-31 DIAGNOSIS — R197 Diarrhea, unspecified: Secondary | ICD-10-CM

## 2018-01-31 DIAGNOSIS — K2100 Gastro-esophageal reflux disease with esophagitis, without bleeding: Secondary | ICD-10-CM

## 2018-02-05 ENCOUNTER — Ambulatory Visit: Payer: Medicare Other | Admitting: Nurse Practitioner

## 2018-02-05 ENCOUNTER — Encounter: Payer: Self-pay | Admitting: Nurse Practitioner

## 2018-02-05 VITALS — BP 125/79 | HR 90 | Temp 96.3°F | Ht 62.0 in | Wt 158.8 lb

## 2018-02-05 DIAGNOSIS — K580 Irritable bowel syndrome with diarrhea: Secondary | ICD-10-CM

## 2018-02-05 DIAGNOSIS — K2100 Gastro-esophageal reflux disease with esophagitis, without bleeding: Secondary | ICD-10-CM

## 2018-02-05 DIAGNOSIS — R197 Diarrhea, unspecified: Secondary | ICD-10-CM | POA: Diagnosis not present

## 2018-02-05 DIAGNOSIS — K589 Irritable bowel syndrome without diarrhea: Secondary | ICD-10-CM | POA: Insufficient documentation

## 2018-02-05 MED ORDER — RANITIDINE HCL 150 MG PO TABS: 150.0000 mg | ORAL_TABLET | Freq: Two times a day (BID) | ORAL | 3 refills | Status: DC

## 2018-02-05 NOTE — Assessment & Plan Note (Signed)
GERD symptoms have significantly improved on Zantac.  She is requesting a refill.  I will send this in today.  Continue current medications and follow-up in 6 months.

## 2018-02-05 NOTE — Patient Instructions (Signed)
1. Continue taking your current medications. 2. I have refilled Zantac for you. 3. As we discussed, you can take Imodium on top of Bentyl if needed for having "a bad day." 4. Return for follow-up in 6 months. 5. Call us if you have any questions or concerns.  At [facility] we value your feedback. You may receive a survey about your visit today. Please share your experience as we strive to create trusting relationships with our patients to provide genuine, compassionate, quality care.  We appreciate your understanding and patience as we review any laboratory studies, imaging, and other diagnostic tests that are ordered as we care for you. Our office policy is 5 business days for review of these results, and any emergent or urgent results are addressed in a timely manner for your best interest. If you do not hear from our office in 1 week, please contact us.   We also encourage the use of MyChart, which contains your medical information for your review as well. If you are not enrolled in this feature, an access code is on this after visit summary for your convenience. Thank you for allowing [place] to be involved in your care.  It was great to see you today!  I hope you have a great summer!!

## 2018-02-05 NOTE — Progress Notes (Signed)
Referring Provider: Lemmie Evens, MD Primary Care Physician:  Lemmie Evens, MD Primary GI:  Dr. Gala Romney  Chief Complaint  Patient presents with  . Diarrhea    doing better    HPI:   [name] is a 84 y.o. female who presents follow-up on diarrhea.  Patient was last seen in our office 11/06/2017 for the same as well as GERD.  Diarrhea, history of IBS.  Takes probiotics.  Has had surgery for persistent GERD and hiatal hernia.  Last colonoscopy 2001 and recommended no future screenings based on age.  This completed stool studies negative for C. difficile but positive for Salmonella and the patient was treated with Cipro 500 mg for 7 days given prolonged duration of symptoms and her age.  At her last visit she took all her antibiotics but was still having diarrhea.  Primary care started Questran and seems like her stools are starting to form.  Status post cholecystectomy.  Intermittent nausea and Zofran to strong for her "it knocks me out."  No other GI symptoms.  Recommended Zantac 150 milligrams daily, continue Questran and probiotics, Bentyl 10 mg as needed, follow-up in 2 months.  Today she states she's doing well. Diarrhea has improved on Bentyl. Had one bout this week which resolved with bentyl. Doesn't handle stress well and has had increased stress. Some lower abdominal pain with increased stress. Denies hematochezia, melena, fever, chills, unintentional weight loss. Denies chest pain, dyspnea, dizziness, lightheadedness, syncope, near syncope. Denies any other upper or lower GI symptoms.  Past Medical History:  Diagnosis Date  . Ankle swelling    takes torsemide prn  . Anxiety   . Arthritis   . Asthma    allergy induced  . Back pain   . Complication of anesthesia   . Depression   . GERD (gastroesophageal reflux disease)   . Hypercholesterolemia   . Hypothyroid   . Parkinson disease ([place])   . PONV (postoperative nausea and vomiting)   . Seasonal allergies     Past  Surgical History:  Procedure Laterality Date  . ABDOMINAL HYSTERECTOMY    . CARPAL TUNNEL RELEASE Right 10/26/2015   Procedure: CARPAL TUNNEL RELEASE;  Surgeon: Daryll Brod, MD;  Location: [place];  Service: Orthopedics;  Laterality: Right;  . CARPOMETACARPEL SUSPENSION PLASTY Right 10/26/2015   Procedure: RIGHT HAND TRAPEZIECTOMY WITH THUMB METACARPEL SUSPENSION PLASTY;  Surgeon: Daryll Brod, MD;  Location: [facility] [place];  Service: Orthopedics;  Laterality: Right;  . CATARACT EXTRACTION W/PHACO  12/23/2011   Procedure: CATARACT EXTRACTION PHACO AND INTRAOCULAR LENS PLACEMENT (IOC);  Surgeon: Williams Che, MD;  Location: [facility];  Service: Ophthalmology;  Laterality: Right;  CDE=5.16  . CHOLECYSTECTOMY    . EYE SURGERY     cataract extraction of left eye-[facility]-2002  . KNEE ARTHROSCOPY     right  . NISSEN FUNDOPLICATION     [facility]  . TRIGGER FINGER RELEASE Right 10/26/2015   Procedure: RELEASE A-1 PULLEY RIGHT RING FINGER;  Surgeon: Daryll Brod, MD;  Location: [place];  Service: Orthopedics;  Laterality: Right;    Current Outpatient Medications  Medication Sig Dispense Refill  . albuterol (PROVENTIL HFA;VENTOLIN HFA) 108 (90 BASE) MCG/ACT inhaler Inhale 2 puffs into the lungs every 6 (six) hours as needed. For shortness of breath    . albuterol (PROVENTIL) (2.5 MG/3ML) 0.083% nebulizer solution Take 2.5 mg by nebulization every 6 (six) hours as needed for wheezing or shortness of breath.    Marland Kitchen  ALPRAZolam (XANAX) 0.25 MG tablet Take 1 tablet (0.25 mg total) by mouth at bedtime. (Patient taking differently: Take 0.5 mg by mouth at bedtime. ) 30 tablet 5  . Biotin 10 MG CAPS Take 1 capsule by mouth daily.     Marland Kitchen bismuth subsalicylate (PEPTO BISMOL) 262 MG/15ML suspension Take 30 mLs by mouth as needed.    . carbidopa-levodopa (SINEMET IR) 25-100 MG tablet Take 1 tablet by mouth 3 (three) times daily. 270 tablet 1  . cetirizine-pseudoephedrine (ZYRTEC-D)  5-120 MG per tablet Take 1 tablet by mouth daily. For sinus     . Cholecalciferol (D3-1000 PO) Take by mouth daily.    . cholestyramine (QUESTRAN) 4 GM/DOSE powder Take by mouth 2 (two) times daily with a meal.    . dicyclomine (BENTYL) 10 MG capsule Take 30m orally up to twice daily as needed for abdominal cramps/diarrhea. 180 capsule 0  . escitalopram (LEXAPRO) 5 MG tablet Take 10 mg by mouth daily.    . Lactobacillus (FLORAJEN ACIDOPHILUS PO) Take 1 tablet by mouth daily.    .Marland Kitchenlevothyroxine (SYNTHROID, LEVOTHROID) 25 MCG tablet Take 25 mcg by mouth daily.    .Marland Kitchenloperamide (IMODIUM) 2 MG capsule Take by mouth as needed for diarrhea or loose stools.    . mometasone (NASONEX) 50 MCG/ACT nasal spray Place 2 sprays into the nose 2 (two) times daily as needed. For allergies    . mometasone-formoterol (DULERA) 100-5 MCG/ACT AERO Inhale 2 puffs into the lungs 2 (two) times daily.    . montelukast (SINGULAIR) 10 MG tablet Take 10 mg by mouth at bedtime.      . Multiple Vitamins-Minerals (CAL-DAY 1000 PO) Take by mouth daily.    . naproxen (NAPROSYN) 500 MG tablet Take 500 mg by mouth 2 (two) times daily with a meal.    . ondansetron (ZOFRAN) 4 MG tablet Take 1 tablet (4 mg total) by mouth every 8 (eight) hours as needed for nausea or vomiting. 30 tablet 1  . OVER THE COUNTER MEDICATION IB Gard as needed    . potassium chloride (MICRO-K) 10 MEQ CR capsule Take 20 mEq by mouth daily.     .Marland KitchenQVAR REDIHALER 80 MCG/ACT AERB INHALATION TAKE 2 PUFFS DAILY TO PREVENT BREATHING PROBLEMS.  7  . ranitidine (ZANTAC) 150 MG tablet Take 1 tablet (150 mg total) by mouth 2 (two) times daily. 30 tablet 3  . torsemide (DEMADEX) 10 MG tablet Take 10 mg by mouth daily. 1/2 to 1 tab daily     No current facility-administered medications for this visit.     Allergies as of 02/05/2018 - Review Complete 02/05/2018  Allergen Reaction Noted  . Cephalosporins Rash 10/19/2010  . Latex Rash 01/17/2011  . Lactose intolerance  (gi) Nausea And Vomiting 12/09/2011  . Lactulose Nausea And Vomiting 06/15/2015  . Cephalexin Rash 06/15/2015  . Meperidine and related Nausea And Vomiting 10/19/2010  . Omeprazole Rash 06/15/2015    Family History  Problem Relation Age of Onset  . Stroke Mother   . COPD Father   . Stroke Father   . Colon cancer Neg Hx     Social History   Socioeconomic History  . Marital status: Married    Spouse name: Not on file  . Number of children: 2  . Years of education: Not on file  . 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 Financial resource strain: Not on file  .  Food insecurity:    Worry: Not on file    Inability: Not on file  . Transportation needs:    Medical: Not on file    Non-medical: Not on file  Tobacco Use  . Smoking status: Never Smoker  . Smokeless tobacco: Never Used  Substance and Sexual Activity  . Alcohol use: Yes    Alcohol/week: 1.0 standard drinks    Types: 1 Glasses of wine per week    Comment: occ  . Drug use: No  . Sexual activity: Not on file  Lifestyle  . Physical activity:    Days per week: Not on file    Minutes per session: Not on file  . Stress: Not on file  Relationships  . Social connections:    Talks on phone: Not on file    Gets together: Not on file    Attends religious service: Not on file    Active member of club or organization: Not on file    Attends meetings of clubs or organizations: Not on file    Relationship status: Not on file  Other Topics Concern  . Not on file  Social History Narrative   Patient lives at home alone and she is widowed.   Retired.    Education one year business course.   Right handed.   Caffeine two cup of coffee daily and one sweet tea.    Review of Systems: General: Negative for anorexia, weight loss, fever, chills, fatigue, weakness. ENT: Negative for hoarseness, difficulty swallowing , nasal congestion. CV: Negative for chest pain,  angina, palpitations, peripheral edema.  Respiratory: Negative for dyspnea at rest, cough, sputum, wheezing.  GI: See history of present illness. Endo: Negative for unusual weight change.  Heme: Negative for bruising or bleeding.   Physical Exam: BP 125/79   Pulse 90   Temp (!) 96.3 F (35.7 C) (Oral)   Ht 5' 2"  (1.575 m)   Wt 158 lb 12.8 oz (72 kg)   BMI 29.04 kg/m  General:   Alert and oriented. Pleasant and cooperative. Well-nourished and well-developed.  Eyes:  Without icterus, sclera clear and conjunctiva pink.  Ears:  Normal auditory acuity. Cardiovascular:  S1, S2 present without murmurs appreciated. Extremities without clubbing or edema. Respiratory:  Clear to auscultation bilaterally. No wheezes, rales, or rhonchi. No distress.  Gastrointestinal:  +BS, soft, non-tender and non-distended. No HSM noted. No guarding or rebound. No masses appreciated.  Rectal:  Deferred  Musculoskalatal:  Symmetrical without gross deformities. Neurologic:  Alert and oriented x4;  grossly normal neurologically. Psych:  Alert and cooperative. Normal mood and affect. Heme/Lymph/Immune: No excessive bruising noted.    02/05/2018 11:28 AM   Disclaimer: This note was dictated with voice recognition software. Similar sounding words can inadvertently be transcribed and may not be corrected upon review.

## 2018-02-05 NOTE — Progress Notes (Signed)
CC'D TO PCP °

## 2018-02-05 NOTE — Assessment & Plan Note (Signed)
Known history of irritable bowel syndrome diarrhea type.  She did have Salmonella infection she was treated due to her age and duration of her diarrhea symptoms.  This did not help her diarrhea.  She takes probiotics.  Bentyl has significantly helped her diarrhea and she will occasionally take Imodium if needed for breakthrough diarrhea during episodes of significant stress.  I recommend she continue to do this.  Return for follow-up in 6 months.  Call if any worsening or recurrent symptoms before then.

## 2018-02-20 ENCOUNTER — Telehealth: Payer: Self-pay | Admitting: Neurology

## 2018-02-20 NOTE — Telephone Encounter (Addendum)
Spoke with pt. She states she has felt tremors and has had a couple of falls, noted walking sideways, feels off balance the last few weeks even a month. She has started using her cane again. She reports she has had problems in the past with walking and now it has returned. Pt sounded clear and concise on the phone. She stated that her family hasn't said anything about slurred speech but occasionally says a word is not understood as well (not a whole sentence just a word). She reports the newest medications are Bentyl, Zantac, and Questran. She endorses some dizziness. D/w Dr. Brett Fairy and she agreed that pt should see PCP first and then if there are changes noted we can see her sooner. RN discussed stroke symptoms and advised pt that if any develop or if she has any acute worsening to call 911 and get to hospital immediately. Asked for an update after she speaks with PCP. Pt verbalized appreciation.

## 2018-02-20 NOTE — Telephone Encounter (Signed)
Pt has called to schedule her 6 mo f/u from 10-2016, pt states she is now experiencing dizziness which is new.  Pt states she is also starting to fall again.  Pt is asking to be called, she has accepted 1st available appointment and is on wait list

## 2018-02-24 NOTE — Telephone Encounter (Signed)
Can we put her on cancellation list, please. Thank you

## 2018-02-24 NOTE — Telephone Encounter (Signed)
Called pt and scheduled her for tomorrow 9/18 @ 3:00 pm arrival 2:30. Pt very appreciative.

## 2018-02-25 ENCOUNTER — Encounter: Payer: Self-pay | Admitting: Neurology

## 2018-02-25 ENCOUNTER — Ambulatory Visit: Payer: Medicare Other | Admitting: Neurology

## 2018-02-25 VITALS — BP 119/65 | HR 88 | Ht 62.0 in | Wt 162.0 lb

## 2018-02-25 DIAGNOSIS — K2100 Gastro-esophageal reflux disease with esophagitis, without bleeding: Secondary | ICD-10-CM

## 2018-02-25 DIAGNOSIS — K51018 Ulcerative (chronic) pancolitis with other complication: Secondary | ICD-10-CM

## 2018-02-25 DIAGNOSIS — M4134 Thoracogenic scoliosis, thoracic region: Secondary | ICD-10-CM

## 2018-02-25 DIAGNOSIS — G20 Parkinson's disease: Secondary | ICD-10-CM

## 2018-02-25 DIAGNOSIS — G5603 Carpal tunnel syndrome, bilateral upper limbs: Secondary | ICD-10-CM

## 2018-02-25 DIAGNOSIS — H8149 Vertigo of central origin, unspecified ear: Secondary | ICD-10-CM

## 2018-02-25 DIAGNOSIS — H814 Vertigo of central origin: Secondary | ICD-10-CM

## 2018-02-25 DIAGNOSIS — G20A1 Parkinson's disease without dyskinesia, without mention of fluctuations: Secondary | ICD-10-CM

## 2018-02-25 MED ORDER — CARBIDOPA-LEVODOPA 25-100 MG PO TABS: 1.0000 | ORAL_TABLET | Freq: Four times a day (QID) | ORAL | 1 refills | Status: DC

## 2018-02-25 NOTE — Progress Notes (Signed)
PATIENT: [name] DOB: 01/26/1934  REASON FOR VISIT: follow up-tremor, insomnia HISTORY FROM: patient  HISTORY OF PRESENT ILLNESS:   10-22-2016 [name] is an 84 year old female with a history of resting tremor and insomnia. She has been diagnosed 11 years ago.  She has worried about her ability to walk and implemented a strong exercise regimen, stationary bicycle 3 times a week. She feels improvement . She likes to walk, does not need a cane any more !   She is currently on Sinemet 3 times a day, sometimes forgets the last dose. . She reports that this is beneficial for her tremor and she noted a difference when not taking it.  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  She states that more recently she's noticed that the hands go numb - it only last for several minutes and then it improves. She has had surgery on the right hand for carpal tunnel syndrome in the hands. She needed a tendon replacement (?) and a ganglion cyst removal, but her right hand I has lost strength. 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  She states that occasionally she has to use Xanax for sleep. Reports that she does not use it every night. Denies any trouble swallowing or speaking. No falls and no changes with her gait or balance. She returns today for an evaluation.  HISTORY [name] is a 84 year old female with a history of resting tremor and insomnia. She returns today for follow-up. The patient has continued taking Sinemet 3 times a day. She feels that this has been beneficial for her tremor. She continues to use a cane intermittently when ambulating. Denies any falls. She denies any trouble swallowing. Denies any drooling. She states that she can have surgery on the right hand to remove a ganglion cyst. She reports that she's been using Xanax to help her sleep. She is  currently taking 0.5 mg. She states that the medication does make her very groggy the next morning. However she does state that she can become very anxious at times. The Xanax does help her fall asleep. She denies any new neurological symptoms. She returns today for an evaluation.  HISTORY: July 2016 ( MM)  is a 84 year old female with a history of resting tremor referred by Dr. Karie Kirks. She returns today for follow-up. The patient continues to take Sinemet 3 times a day. She reports that her resting tremor has improved. Patient denies any changes with her walking or balance. Denies any falls since the last visit. Denies any trouble swallowing. She does state that she has a goiter. Patient does have trouble falling asleep. She has been prescribed Xanax in the past to help with this. She states that she will take this occasionally but she tries not to take it consistently so that she doesn't become dependent on the medication. The patient states that she recently saw Dr. Karie Kirks and he decreased her Zoloft to half a tablet a day. She states that so far she is tolerating this well. She states that if she starts to feel emotionally or "Antsy" she will take the other half. Overall she feels that she is doing well. Returns today for an evaluation.  06-15-15; Patient weaned completely off Hydrocodone and takes ibuprofen. She has arthritis in both hands. She has developed significant carpal tunnel in both hands with a shrinkage of the  thenar eminence.The patient is right-handed and is significantly enough bothered by the carpal tunnel that I think she should be surgically evaluated.She feels weaker than last year. In addition her resting tremor appears to be well controlled on 3 times a day Sinemet. She does not have dysarthria or dysphonia, she does have a history of hiatal hernia and reflux. Her primary care physician, Dr. Karie Kirks, has tried to reduce her off the Prilosec and also reduced her antidepressant  Zoloft to 50% of the previous dose. Her daughter feels that she gets "snappy " in response and more "abrupt" which is usually not her personality. It may be that Zoloft is no longer working for her and that it would be time to look at anewer antidepressants. I suggested Pristiq at 50 mg daily. I am not sure her medicare plan will allow it. I like for her to take prilosec every other day.   REVIEW OF SYSTEMS: Out of a complete 14 system review of symptoms, the patient complains only of the following symptoms, and all other reviewed systems are negative. Epworth Sleepiness score 4 points,  48 points on Fatigue Severity, geriatric depression score  4/15 p. Insomnia has improved on xanax , but without it she is unable to sleep in less than 2 hours. , diarrhea, nausea, excessive eating, excessive thirst, eye itching, ringing in ears, drooling, back pain, muscle cramps, neck pain, nervous/anxious, headache, numbness, incontinence of bladder, environmental allergies  ALLERGIES: Allergies  Allergen Reactions  . Cephalosporins Rash  . Latex Rash    Breaks out then gets infected  . Lactose Intolerance (Gi) Nausea And Vomiting  . Lactulose Nausea And Vomiting  . Cephalexin Rash  . Meperidine And Related Nausea And Vomiting    Needed Phenergan to combat nausea.  . Omeprazole Rash    HOME MEDICATIONS: Outpatient Medications Prior to Visit  Medication Sig Dispense Refill  . albuterol (PROVENTIL HFA;VENTOLIN HFA) 108 (90 BASE) MCG/ACT inhaler Inhale 2 puffs into the lungs every 6 (six) hours as needed. For shortness of breath    . albuterol (PROVENTIL) (2.5 MG/3ML) 0.083% nebulizer solution Take 2.5 mg by nebulization every 6 (six) hours as needed for wheezing or shortness of breath.    . ALPRAZolam (XANAX) 0.5 MG tablet 0.25-0.5 mg at bedtime as needed.     . Biotin 10 MG CAPS Take 1 capsule by mouth daily.     Marland Kitchen bismuth subsalicylate (PEPTO BISMOL) 262 MG/15ML suspension Take 30 mLs by mouth as  needed.    . carbidopa-levodopa (SINEMET IR) 25-100 MG tablet Take 1 tablet by mouth 3 (three) times daily. 270 tablet 1  . cetirizine-pseudoephedrine (ZYRTEC-D) 5-120 MG per tablet Take 1 tablet by mouth daily. For sinus     . Cholecalciferol (D3-1000 PO) Take by mouth daily.    . cholestyramine (QUESTRAN) 4 GM/DOSE powder Take by mouth 2 (two) times daily with a meal.    . dicyclomine (BENTYL) 10 MG capsule Take 59m orally up to twice daily as needed for abdominal cramps/diarrhea. 180 capsule 0  . escitalopram (LEXAPRO) 5 MG tablet Take 10 mg by mouth daily.    . Lactobacillus (FLORAJEN ACIDOPHILUS PO) Take 1 tablet by mouth daily.    .Marland Kitchenlevothyroxine (SYNTHROID, LEVOTHROID) 25 MCG tablet Take 25 mcg by mouth daily.    .Marland Kitchenloperamide (IMODIUM) 2 MG capsule Take by mouth as needed for diarrhea or loose stools.    . meclizine (ANTIVERT) 25 MG tablet Take 12.5-25 mg by mouth 4 (four) times daily  as needed for dizziness.    . mometasone (NASONEX) 50 MCG/ACT nasal spray Place 2 sprays into the nose 2 (two) times daily as needed. For allergies    . montelukast (SINGULAIR) 10 MG tablet Take 10 mg by mouth at bedtime.      . Multiple Vitamins-Minerals (CAL-DAY 1000 PO) Take by mouth daily.    . naproxen (NAPROSYN) 500 MG tablet Take 500 mg by mouth 2 (two) times daily with a meal.    . ondansetron (ZOFRAN) 4 MG tablet Take 1 tablet (4 mg total) by mouth every 8 (eight) hours as needed for nausea or vomiting. 30 tablet 1  . OVER THE COUNTER MEDICATION IB Gard as needed    . potassium chloride (MICRO-K) 10 MEQ CR capsule Take 20 mEq by mouth daily.     Marland Kitchen QVAR REDIHALER 80 MCG/ACT AERB INHALATION TAKE 2 PUFFS DAILY TO PREVENT BREATHING PROBLEMS.  7  . ranitidine (ZANTAC) 150 MG tablet Take 1 tablet (150 mg total) by mouth 2 (two) times daily. 180 tablet 3  . torsemide (DEMADEX) 10 MG tablet Take 10 mg by mouth daily. 1/2 to 1 tab daily    . ALPRAZolam (XANAX) 0.25 MG tablet Take 1 tablet (0.25 mg total) by  mouth at bedtime. (Patient taking differently: Take 0.5 mg by mouth at bedtime. ) 30 tablet 5  . mometasone-formoterol (DULERA) 100-5 MCG/ACT AERO Inhale 2 puffs into the lungs 2 (two) times daily.     No facility-administered medications prior to visit.     PAST MEDICAL HISTORY: Past Medical History:  Diagnosis Date  . Ankle swelling    takes torsemide prn  . Anxiety   . Arthritis   . Asthma    allergy induced  . Back pain   . Complication of anesthesia   . Depression   . GERD (gastroesophageal reflux disease)   . Hypercholesterolemia   . Hypothyroid   . Parkinson disease ([place])   . PONV (postoperative nausea and vomiting)   . Seasonal allergies     PAST SURGICAL HISTORY: Past Surgical History:  Procedure Laterality Date  . ABDOMINAL HYSTERECTOMY    . CARPAL TUNNEL RELEASE Right 10/26/2015   Procedure: CARPAL TUNNEL RELEASE;  Surgeon: Daryll Brod, MD;  Location: [facility];  Service: Orthopedics;  Laterality: Right;  . CARPOMETACARPEL SUSPENSION PLASTY Right 10/26/2015   Procedure: RIGHT HAND TRAPEZIECTOMY WITH THUMB METACARPEL SUSPENSION PLASTY;  Surgeon: Daryll Brod, MD;  Location: [facility];  Service: Orthopedics;  Laterality: Right;  . CATARACT EXTRACTION W/PHACO  12/23/2011   Procedure: CATARACT EXTRACTION PHACO AND INTRAOCULAR LENS PLACEMENT (IOC);  Surgeon: Williams Che, MD;  Location: [facility];  Service: Ophthalmology;  Laterality: Right;  CDE=5.16  . CHOLECYSTECTOMY    . EYE SURGERY     cataract extraction of left eye-[facility]-2002  . KNEE ARTHROSCOPY     right  . NISSEN FUNDOPLICATION     [facility]  . TRIGGER FINGER RELEASE Right 10/26/2015   Procedure: RELEASE A-1 PULLEY RIGHT RING FINGER;  Surgeon: Daryll Brod, MD;  Location: [facility];  Service: Orthopedics;  Laterality: Right;    FAMILY HISTORY: Family History  Problem Relation Age of Onset  . Stroke Mother   . COPD Father   . Stroke Father   . Colon cancer Neg Hx      SOCIAL HISTORY: Social History   Socioeconomic History  . Marital status: Married    Spouse name: Not on file  . Number of  children: 2  . Years of education: Not on file  . Highest education level: Not on file  Occupational History    Comment: retired  Scientific laboratory technician  . Financial resource strain: Not on file  . Food insecurity:    Worry: Not on file    Inability: Not on file  . Transportation needs:    Medical: Not on file    Non-medical: Not on file  Tobacco Use  . Smoking status: Never Smoker  . Smokeless tobacco: Never Used  Substance and Sexual Activity  . Alcohol use: Yes    Alcohol/week: 1.0 standard drinks    Types: 1 Glasses of wine per week    Comment: occ  . Drug use: No  . Sexual activity: Not on file  Lifestyle  . Physical activity:    Days per week: Not on file    Minutes per session: Not on file  . Stress: Not on file  Relationships  . Social connections:    Talks on phone: Not on file    Gets together: Not on file    Attends religious service: Not on file    Active member of club or organization: Not on file    Attends meetings of clubs or organizations: Not on file    Relationship status: Not on file  . Intimate partner violence:    Fear of current or ex partner: Not on file    Emotionally abused: Not on file    Physically abused: Not on file    Forced sexual activity: Not on file  Other Topics Concern  . Not on file  Social History Narrative   Patient lives at home alone and she is widowed.   Retired.    Education one year business course.   Right handed.   Caffeine two cup of coffee daily and one sweet tea.      PHYSICAL EXAM  Vitals:   02/25/18 1504  BP: 119/65  Pulse: 88  Weight: 162 lb (73.5 kg)  Height: 5' 2"  (1.575 m)   Body mass index is 29.63 kg/m.  Generalized: Well developed, but worried about recent falls.   Neurological examination  Mentation: Alert oriented to time, place, history taking. Follows all commands  , reports word finding difficulties.  Cranial nerve :  Loss of sense of smell- severe.  Taste less affected. Pupils are equal round reactive to light.  No ptosis, left eye is not fully closed when she sleeps- Extraocular movements were full, visual field were full on confrontational test.  Facial sensation and strength were normal. Uvula tongue midline. Head turning and shoulder shrug  were normal and symmetric. Motor: Symmetrically elevated  motor tone is noted throughout. No cogwheeling.  Right hand strength is reduced, pinch and grip .  Sensory:  No evidence of extinction over the upper extremities, feet are bilaterally numb to vibration.  Coordination:  good finger-nose-finger bilaterally.  Gait and station: Gait is stooped . Tandem gait is unsteady. Ataxia.  Uses heel toe commands.   Reflexes: Deep tendon reflexes are symmetric and brisk bilaterally.   DIAGNOSTIC DATA (LABS, IMAGING, TESTING) - I reviewed patient records, labs, notes, testing and imaging myself where available.    ASSESSMENT AND PLAN 84 y.o. year old female  has a past medical history of Ankle swelling, Anxiety, Arthritis, Asthma, Back pain, Complication of anesthesia, Depression, GERD (gastroesophageal reflux disease), Hypercholesterolemia, Hypothyroid, Parkinson disease ([place]), PONV (postoperative nausea and vomiting), and Seasonal allergies. here with:  New onset vertigo, poor  sleep ( again ) , and worried about word finding difficulties)  1. Tremor , dopamine responsive -thought to reflect  PD. Inhaler makes tremor stronger. Sinemet is reducing the tremor.  2. Polyneuropathy, Carpaltunnel- propensity may be related to Colitis ?   3. Insomnia improved. She has still a lot of vivi dreams-REM BD possibly.  4. Vertigo , currently on meclizine - makes her sleepy and is not helping much. Onset in July 2019. She feels moving counterclockwise after rapid head movements.  5. MCI ? Will use MOCA/MMSE over the next few visit.  RV in 3-4 month.   Continue on Sinemet 25-100 milligrams , but increase to 4 times a day before a meal. She has had tremors for about 12 years.  Patient advised that the numbness in her hands could be a result of carpal tunnel and polyneuropathy.  She will continue using Xanax as needed. Advised that she should not use this nightly.  Referral to vestibular rehab and PT.    She verbalized understanding. She will follow-up in 3-4  months with either me, Dr. Brett Fairy, or NP for [facility]   Larey Seat, MD  02/25/2018, 3:27 PM [facility] [address], [place], [place] [address] [phone]

## 2018-02-25 NOTE — Patient Instructions (Signed)
Dizziness Dizziness is a common problem. It is a feeling of unsteadiness or light-headedness. You may feel like you are about to faint. Dizziness can lead to injury if you stumble or fall. Anyone can become dizzy, but dizziness is more common in older adults. This condition can be caused by a number of things, including medicines, dehydration, or illness. Follow these instructions at home: Eating and drinking  Drink enough fluid to keep your urine clear or pale yellow. This helps to keep you from becoming dehydrated. Try to drink more clear fluids, such as water.  Do not drink alcohol.  Limit your caffeine intake if told to do so by your health care provider. Check ingredients and nutrition facts to see if a food or beverage contains caffeine.  Limit your salt (sodium) intake if told to do so by your health care provider. Check ingredients and nutrition facts to see if a food or beverage contains sodium. Activity  Avoid making quick movements. ? Rise slowly from chairs and steady yourself until you feel okay. ? In the morning, first sit up on the side of the bed. When you feel okay, stand slowly while you hold onto something until you know that your balance is fine.  If you need to stand in one place for a long time, move your legs often. Tighten and relax the muscles in your legs while you are standing.  Do not drive or use heavy machinery if you feel dizzy.  Avoid bending down if you feel dizzy. Place items in your home so that they are easy for you to reach without leaning over. Lifestyle  Do not use any products that contain nicotine or tobacco, such as cigarettes and e-cigarettes. If you need help quitting, ask your health care provider.  Try to reduce your stress level by using methods such as yoga or meditation. Talk with your health care provider if you need help to manage your stress. General instructions  Watch your dizziness for any changes.  Take over-the-counter and  prescription medicines only as told by your health care provider. Talk with your health care provider if you think that your dizziness is caused by a medicine that you are taking.  Tell a friend or a family member that you are feeling dizzy. If he or she notices any changes in your behavior, have this person call your health care provider.  Keep all follow-up visits as told by your health care provider. This is important. Contact a health care provider if:  Your dizziness does not go away.  Your dizziness or light-headedness gets worse.  You feel nauseous.  You have reduced hearing.  You have new symptoms.  You are unsteady on your feet or you feel like the room is spinning. Get help right away if:  You vomit or have diarrhea and are unable to eat or drink anything.  You have problems talking, walking, swallowing, or using your arms, hands, or legs.  You feel generally weak.  You are not thinking clearly or you have trouble forming sentences. It may take a friend or family member to notice this.  You have chest pain, abdominal pain, shortness of breath, or sweating.  Your vision changes.  You have any bleeding.  You have a severe headache.  You have neck pain or a stiff neck.  You have a fever. These symptoms may represent a serious problem that is an emergency. Do not wait to see if the symptoms will go away. Get medical help  right away. Call your local emergency services (911 in the [place] Do not drive yourself to the hospital. Summary  Dizziness is a feeling of unsteadiness or light-headedness. This condition can be caused by a number of things, including medicines, dehydration, or illness.  Anyone can become dizzy, but dizziness is more common in older adults.  Drink enough fluid to keep your urine clear or pale yellow. Do not drink alcohol.  Avoid making quick movements if you feel dizzy. Monitor your dizziness for any changes. This information is not intended to  replace advice given to you by your health care provider. Make sure you discuss any questions you have with your health care provider. Document Released: 11/20/2000 Document Revised: 06/29/2016 Document Reviewed: 06/29/2016 Elsevier Interactive Patient Education  [employer].

## 2018-02-26 ENCOUNTER — Telehealth: Payer: Self-pay | Admitting: Neurology

## 2018-02-26 NOTE — Telephone Encounter (Signed)
Called the patient and made her aware that she had left a bottle of her medication here at the office during her apt. I told her I would put in a envelope with her name on it at the front desk. Pt verbalized understanding.

## 2018-03-10 ENCOUNTER — Other Ambulatory Visit: Payer: Self-pay

## 2018-03-10 ENCOUNTER — Ambulatory Visit (HOSPITAL_COMMUNITY): Payer: Medicare Other | Attending: Neurology | Admitting: Occupational Therapy

## 2018-03-10 ENCOUNTER — Encounter (HOSPITAL_COMMUNITY): Payer: Self-pay | Admitting: Occupational Therapy

## 2018-03-10 DIAGNOSIS — R29898 Other symptoms and signs involving the musculoskeletal system: Secondary | ICD-10-CM | POA: Insufficient documentation

## 2018-03-10 DIAGNOSIS — R2681 Unsteadiness on feet: Secondary | ICD-10-CM | POA: Insufficient documentation

## 2018-03-10 DIAGNOSIS — R42 Dizziness and giddiness: Secondary | ICD-10-CM | POA: Diagnosis present

## 2018-03-10 NOTE — Therapy (Addendum)
[place], [place], [address] Phone: [phone]   Fax:  [phone]  Occupational Therapy Evaluation  Patient Details  Name: [name] MRN: [id number] Date of Birth: 05/21/1934 Referring Provider (OT): Larey Seat, MD   Encounter Date: 03/10/2018  OT End of Session - 03/10/18 1210    Visit Number  1    Number of Visits  8    Date for OT Re-Evaluation  04/07/18    Authorization Type  1) UHC Medicare with $35 copay    OT Start Time  1125    OT Stop Time  1207    OT Time Calculation (min)  42 min    Activity Tolerance  Patient tolerated treatment well    Behavior During Therapy  [facility] for tasks assessed/performed       Past Medical History:  Diagnosis Date  . Ankle swelling    takes torsemide prn  . Anxiety   . Arthritis   . Asthma    allergy induced  . Back pain   . Complication of anesthesia   . Depression   . GERD (gastroesophageal reflux disease)   . Hypercholesterolemia   . Hypothyroid   . Parkinson disease ([place])   . PONV (postoperative nausea and vomiting)   . Seasonal allergies     Past Surgical History:  Procedure Laterality Date  . ABDOMINAL HYSTERECTOMY    . CARPAL TUNNEL RELEASE Right 10/26/2015   Procedure: CARPAL TUNNEL RELEASE;  Surgeon: Daryll Brod, MD;  Location: [place];  Service: Orthopedics;  Laterality: Right;  . CARPOMETACARPEL SUSPENSION PLASTY Right 10/26/2015   Procedure: RIGHT HAND TRAPEZIECTOMY WITH THUMB METACARPEL SUSPENSION PLASTY;  Surgeon: Daryll Brod, MD;  Location: [place];  Service: Orthopedics;  Laterality: Right;  . CATARACT EXTRACTION W/PHACO  12/23/2011   Procedure: CATARACT EXTRACTION PHACO AND INTRAOCULAR LENS PLACEMENT (IOC);  Surgeon: [place] Che, MD;  [facility];  Service: Ophthalmology;  Laterality: Right;  CDE=5.16  . CHOLECYSTECTOMY    . EYE SURGERY     cataract extraction of left eye-[facility]-2002  . KNEE  ARTHROSCOPY     right  . NISSEN FUNDOPLICATION     [facility]  . TRIGGER FINGER RELEASE Right 10/26/2015   Procedure: RELEASE A-1 PULLEY RIGHT RING FINGER;  Surgeon: Daryll Brod, MD;  Location: [place];  Service: Orthopedics;  Laterality: Right;    There were no vitals filed for this visit.  Subjective Assessment - 03/10/18 1153    Subjective   Pertinent History      Patient stated goals  S: My hand and wrist have been weak for about 4 years since my carpal tunnel surgery.   Patient is an 84 y/o female presenting with wrist and hand pain and weakness along with decreased grip and pinch and impaired coordination that interfere with ADL completion. She states that she has experienced symptoms since she had surgery to remove a ganglion cyst as well as carpal tunnel release on her right hand 4 years ago. To improve functional use of right upper extremity.   Currently in Pain?  No/denies        [facility] OT Assessment - 03/10/18 1124      Assessment   Medical Diagnosis  Wrist and hand weakness    Referring Provider (OT)  Larey Seat, MD    Onset Date/Surgical Date  06/10/17    Hand Dominance  Right    Next MD Visit  06/2018    Prior Therapy  Outpatient OT after carpal tunnel surgery, 4 years ago      Precautions   Precautions  None      Balance Screen   Has the patient fallen in the past 6 months  Yes    How many times?  8 or more    Has the patient had a decrease in activity level because of a fear of falling?   No    Is the patient reluctant to leave their home because of a fear of falling?   No      Prior Function   Level of Independence  Independent    Vocation  Retired    Office manager, gardening, Control and instrumentation engineer and cooking, arts and crafts (Dietitian), going to church       ADL   ADL comments  Pt having trouble with buttoning during dressing, donning shoes, and difficulty grasping objects. Cannot lift during cooking tasks and needs assistance to lift a roast  out of the oven, for example.      Written Expression   Dominant Hand  Right      Vision - History   Baseline Vision  Wears glasses only for reading    Visual History  Cataracts      Cognition   Overall Cognitive Status  Within Functional Limits for tasks assessed      Tone   Assessment Location  --      ROM / Strength   AROM / PROM / Strength  Strength      Strength   Overall Strength  Deficits    Overall Strength Comments  assessed seated    Strength Assessment Site  Wrist;Hand    Right/Left Wrist  Right    Right Wrist Flexion  3+/5    Right Wrist Extension  3+/5    Right Wrist Radial Deviation  3/5    Right Wrist Ulnar Deviation  3/5    Right/Left hand  Right;Left    Right Hand Gross Grasp  Impaired    Right Hand Grip (lbs)  55    Right Hand Lateral Pinch  6 lbs    Right Hand 3 Point Pinch  4 lbs    Left Hand Gross Grasp  Impaired    Left Hand Grip (lbs)  65    Left Hand Lateral Pinch  10 lbs    Left Hand 3 Point Pinch  11 lbs                      OT Education - 03/10/18 1203    Education Details  Yellow theraputty exercises     Person(s) Educated  Patient    Methods  Explanation;Demonstration;Handout;Verbal cues    Comprehension  Verbalized understanding;Returned demonstration       OT Short Term Goals - 03/10/18 1349      OT SHORT TERM GOAL #1   Title  Patient will be educated and independent with a HEP for improved right wrist and hand strength.      Time  4    Period  Weeks    Status  New    Target Date  04/07/18      OT SHORT TERM GOAL #2   Title  Patient will increase lateral and three point pinch strength by at least 4 pounds to more easily complete arts and crafts.     Time  4    Period  Weeks    Status  New      OT SHORT TERM GOAL #3   Title  Patient will improve right wrist strength to at least 4+/5 for improved ability to complete gardening tasks.      Time  4    Period  Weeks    Status  New      OT SHORT TERM GOAL #4    Title  Patient will increase grip strength to 65 pounds or higher in order to grasp toothbrush more easily during oral care.      Time  4    Period  Weeks    Status  New      OT SHORT TERM GOAL #5   Title  --    Time  --    Period  --    Status  --               Plan - 03/10/18 1213    Clinical Impression Statement  A: Patient presents with impaired coordination and decreased strength in her right hand and wrist.  Patient is right hand dominant  and her and lack of strength and fine motor control is causing difficulty with B/IADL completion and leisure activities.  Patient will benefit from skilled OT intervention to increase wrist strength, grip strength, and pinch strength, as well as improve coordination in the right hand to [facility] for B/IADL completion.      Occupational Profile and client history currently impacting functional performance  Patient is an 84 year old female with significant medical history, including Parkinson's Disease. She had a carpal tunnel release surgery four years ago and since this time has experienced impaired use of right wrist and hand. Pt is independent and motivated to return to highest level of function.     Occupational performance deficits (Please refer to evaluation for details):  ADL's;IADL's;Leisure    Rehab Potential  Good    OT Frequency  2x / week    OT Duration  4 weeks    OT Treatment/Interventions  Self-care/ADL training;Therapeutic exercise;Ultrasound;Manual Therapy;Therapeutic activities;Cryotherapy;Paraffin;DME and/or AE instruction;Moist Heat;Contrast Bath;Patient/family education    Plan  P: Skilled OT intervention to increase right wrist strength, hand strength, and hand coordination in order to return to use of right arm as dominant with functional activities. Next session:  Review POC, HEP, and complete 9 hole peg test and write a new goal related to improving coordination, if needed.      Clinical Decision Making  Limited treatment  options, no task modification necessary    OT Home Exercise Plan  Theraputty Exercises    Consulted and Agree with Plan of Care  Patient       Patient will benefit from skilled therapeutic intervention in order to improve the following deficits and impairments:  Decreased activity tolerance, Decreased strength, Impaired UE functional use, Decreased coordination  Visit Diagnosis: Wrist weakness  Hand weakness    Problem List Patient Active Problem List   Diagnosis Date Noted  . IBS (irritable bowel syndrome) 02/05/2018  . Diarrhea 06/16/2017  . Nausea without vomiting 06/16/2017  . Numbness 08/18/2015  . Polyneuropathy 08/18/2015  . Menopausal syndrome (hot flushes) 06/15/2015  . Tremor, essential 06/15/2015  . Gastroesophageal reflux disease with esophagitis 06/15/2015  . Bilateral carpal tunnel syndrome 06/15/2015  . Resting tremor 06/15/2015  . Scoliosis of thoracic spine 11/05/2013  . Spinal stenosis, unspecified region other than cervical 12/03/2012  . Spondylosis of unspecified site without mention of myelopathy 12/03/2012  . Parkinson disease ([place])   .  Back pain   . Seasonal allergies     Toney Rakes, OT student 03/10/2018, [place] [address] [place], [place], [address] Phone: [phone]   Fax:  [phone]  Name: [name] MRN: [id number] Date of Birth: 1933/08/14

## 2018-03-10 NOTE — Patient Instructions (Signed)
Home Exercises Program Theraputty Exercises  Do the following exercises 2-3 times a day using your affected hand.  1. Roll putty into a ball.  2. Make into a pancake.  3. Roll putty into a roll.  4. Pinch along log with first finger and thumb.   5. Make into a ball.  6. Roll it back into a log.   7. Pinch using thumb and side of first finger.  8. Roll into a ball, then flatten into a pancake.  9. Using your fingers, make putty into a mountain.

## 2018-03-13 ENCOUNTER — Ambulatory Visit (HOSPITAL_COMMUNITY): Payer: Medicare Other

## 2018-03-13 ENCOUNTER — Encounter (HOSPITAL_COMMUNITY): Payer: Self-pay

## 2018-03-13 DIAGNOSIS — R29898 Other symptoms and signs involving the musculoskeletal system: Secondary | ICD-10-CM

## 2018-03-13 NOTE — Therapy (Signed)
[name], [place], [address] Phone: [phone]   Fax:  [phone]  Occupational Therapy Treatment  Patient Details  Name: [name] MRN: [id number] Date of Birth: 1934/03/13 Referring Provider (OT): Larey Seat, MD   Encounter Date: 03/13/2018  OT End of Session - 03/13/18 1141    Visit Number  2    Number of Visits  8    Date for OT Re-Evaluation  04/07/18    Authorization Type  1) UHC Medicare with $35 copay    OT Start Time  1118    OT Stop Time  1200    OT Time Calculation (min)  42 min    Activity Tolerance  Patient tolerated treatment well    Behavior During Therapy  [facility] for tasks assessed/performed       Past Medical History:  Diagnosis Date  . Ankle swelling    takes torsemide prn  . Anxiety   . Arthritis   . Asthma    allergy induced  . Back pain   . Complication of anesthesia   . Depression   . GERD (gastroesophageal reflux disease)   . Hypercholesterolemia   . Hypothyroid   . Parkinson disease ([place])   . PONV (postoperative nausea and vomiting)   . Seasonal allergies     Past Surgical History:  Procedure Laterality Date  . ABDOMINAL HYSTERECTOMY    . CARPAL TUNNEL RELEASE Right 10/26/2015   Procedure: CARPAL TUNNEL RELEASE;  Surgeon: Daryll Brod, MD;  Location: [place];  Service: Orthopedics;  Laterality: Right;  . CARPOMETACARPEL SUSPENSION PLASTY Right 10/26/2015   Procedure: RIGHT HAND TRAPEZIECTOMY WITH THUMB METACARPEL SUSPENSION PLASTY;  Surgeon: Daryll Brod, MD;  Location: [place];  Service: Orthopedics;  Laterality: Right;  . CATARACT EXTRACTION W/PHACO  12/23/2011   Procedure: CATARACT EXTRACTION PHACO AND INTRAOCULAR LENS PLACEMENT (IOC);  Surgeon: [place] Che, MD;  [facility];  Service: Ophthalmology;  Laterality: Right;  CDE=5.16  . CHOLECYSTECTOMY    . EYE SURGERY     cataract extraction of left eye-[facility]-2002  . KNEE  ARTHROSCOPY     right  . NISSEN FUNDOPLICATION     [facility]  . TRIGGER FINGER RELEASE Right 10/26/2015   Procedure: RELEASE A-1 PULLEY RIGHT RING FINGER;  Surgeon: Daryll Brod, MD;  Location: [place];  Service: Orthopedics;  Laterality: Right;    There were no vitals filed for this visit.  Subjective Assessment - 03/13/18 1131    Subjective   S: I've noticed my wrist is getting weaker since my surgery 2 years ago.    Currently in Pain?  No/denies         [facility] OT Assessment - 03/13/18 1127      Assessment   Medical Diagnosis  Wrist and hand weakness      Precautions   Precautions  None      Coordination   9 Hole Peg Test  Left;Right    Right 9 Hole Peg Test  21.8"    Left 9 Hole Peg Test  25.2"               OT Treatments/Exercises (OP) - 03/13/18 1136      Exercises   Exercises  Wrist;Elbow;Hand      Elbow Exercises   Forearm Supination  Strengthening;10 reps    Bar Weights/Barbell (Forearm Supination)  1 lb    Forearm Pronation  Strengthening;10 reps  Bar Weights/Barbell (Forearm Pronation)  1 lb      Additional Elbow Exercises   Hand Gripper with Large Beads  all beads with gripper set at 37#   horizontal hold on gripper   Hand Gripper with Medium Beads  all beads with gripper set at 37#   horizontal hold   Hand Gripper with Small Beads  all beads with gripper set at 37#   horizontal hold     Wrist Exercises   Wrist Flexion  AROM;Strengthening;10 reps    Bar Weights/Barbell (Wrist Flexion)  1 lb    Wrist Extension  PROM;Strengthening;10 reps    Bar Weights/Barbell (Wrist Extension)  1 lb    Wrist Radial Deviation  AROM;Strengthening;10 reps    Bar Weights/Barbell (Radial Deviation)  1 lb    Wrist Ulnar Deviation  AROM;Strengthening;10 reps    Bar Weights/Barbell (Ulnar Deviation)  1 lb      Additional Wrist Exercises   Sponges  Patient utilized green and red resistive clothespin to pick up 30 sponges with a 3 point pinch and place  in container. Started with green clothespin and finished remaining 27 sponges with red.             OT Education - 03/13/18 1138    Education Details  Reviewed therapy goals. Discussed CTS and the small percentage of people that require a repeat surgery.     Person(s) Educated  Patient    Methods  Explanation    Comprehension  Verbalized understanding       OT Short Term Goals - 03/13/18 1142      OT SHORT TERM GOAL #1   Title  Patient will be educated and independent with a HEP for improved right wrist and hand strength.      Time  4    Period  Weeks    Status  On-going      OT SHORT TERM GOAL #2   Title  Patient will increase lateral and three point pinch strength by at least 4 pounds to more easily complete arts and crafts.     Time  4    Period  Weeks    Status  On-going      OT SHORT TERM GOAL #3   Title  Patient will improve right wrist strength to at least 4+/5 for improved ability to complete gardening tasks.      Time  4    Period  Weeks    Status  On-going      OT SHORT TERM GOAL #4   Title  Patient will increase grip strength to 65 pounds or higher in order to grasp toothbrush more easily during oral care.      Time  4    Period  Weeks    Status  On-going               Plan - 03/13/18 1235    Clinical Impression Statement  A: Initiated A/ROM, wrist strengthening and grip and pinch tasks. Coordination tested and patient has no coordination deficits. Several rest breaks taken when needed during activities due to hand fatigue.     Plan  P: Add wrist strengthening using PVC pipe to cut circles in putty. Continue with wrist strengthening increasing repetitions to 12.     Consulted and Agree with Plan of Care  Patient       Patient will benefit from skilled therapeutic intervention in order to improve the following deficits and impairments:  Decreased activity tolerance,  Decreased strength, Impaired UE functional use, Decreased coordination  Visit  Diagnosis: Wrist weakness  Hand weakness  Other symptoms and signs involving the musculoskeletal system    Problem List Patient Active Problem List   Diagnosis Date Noted  . IBS (irritable bowel syndrome) 02/05/2018  . Diarrhea 06/16/2017  . Nausea without vomiting 06/16/2017  . Numbness 08/18/2015  . Polyneuropathy 08/18/2015  . Menopausal syndrome (hot flushes) 06/15/2015  . Tremor, essential 06/15/2015  . Gastroesophageal reflux disease with esophagitis 06/15/2015  . Bilateral carpal tunnel syndrome 06/15/2015  . Resting tremor 06/15/2015  . Scoliosis of thoracic spine 11/05/2013  . Spinal stenosis, unspecified region other than cervical 12/03/2012  . Spondylosis of unspecified site without mention of myelopathy 12/03/2012  . Parkinson disease ([place])   . Back pain   . Seasonal allergies    Ailene Ravel, OTR/L,CBIS  [phone]  03/13/2018, 12:37 PM  [address] [place], [place], [address] Phone: [phone]   Fax:  [phone]  Name: [name] MRN: [id number] Date of Birth: [birth year]

## 2018-03-16 ENCOUNTER — Ambulatory Visit (HOSPITAL_COMMUNITY): Payer: Medicare Other | Admitting: Specialist

## 2018-03-16 ENCOUNTER — Encounter (HOSPITAL_COMMUNITY): Payer: Self-pay | Admitting: Specialist

## 2018-03-16 DIAGNOSIS — R29898 Other symptoms and signs involving the musculoskeletal system: Secondary | ICD-10-CM | POA: Diagnosis not present

## 2018-03-16 NOTE — Therapy (Signed)
[place], [place], [address] Phone: [phone]   Fax:  [phone]  Occupational Therapy Treatment  Patient Details  Name: [name] MRN: [id number] Date of Birth: 1934/05/24 Referring Provider (OT): Larey Seat, MD   Encounter Date: 03/16/2018  OT End of Session - 03/16/18 1034    Visit Number  3    Number of Visits  8    Date for OT Re-Evaluation  04/07/18    Authorization Type  1) UHC Medicare with $35 copay    OT Start Time  1001    OT Stop Time  1034    OT Time Calculation (min)  33 min    Activity Tolerance  Patient tolerated treatment well    Behavior During Therapy  [facility] for tasks assessed/performed       Past Medical History:  Diagnosis Date  . Ankle swelling    takes torsemide prn  . Anxiety   . Arthritis   . Asthma    allergy induced  . Back pain   . Complication of anesthesia   . Depression   . GERD (gastroesophageal reflux disease)   . Hypercholesterolemia   . Hypothyroid   . Parkinson disease ([place])   . PONV (postoperative nausea and vomiting)   . Seasonal allergies     Past Surgical History:  Procedure Laterality Date  . ABDOMINAL HYSTERECTOMY    . CARPAL TUNNEL RELEASE Right 10/26/2015   Procedure: CARPAL TUNNEL RELEASE;  Surgeon: Daryll Brod, MD;  Location: [place];  Service: Orthopedics;  Laterality: Right;  . CARPOMETACARPEL SUSPENSION PLASTY Right 10/26/2015   Procedure: RIGHT HAND TRAPEZIECTOMY WITH THUMB METACARPEL SUSPENSION PLASTY;  Surgeon: Daryll Brod, MD;  Location: [place];  Service: Orthopedics;  Laterality: Right;  . CATARACT EXTRACTION W/PHACO  12/23/2011   Procedure: CATARACT EXTRACTION PHACO AND INTRAOCULAR LENS PLACEMENT (IOC);  Surgeon: [place] Che, MD;  [facility];  Service: Ophthalmology;  Laterality: Right;  CDE=5.16  . CHOLECYSTECTOMY    . EYE SURGERY     cataract extraction of left eye-[facility]-2002  . KNEE  ARTHROSCOPY     right  . NISSEN FUNDOPLICATION     [facility]  . TRIGGER FINGER RELEASE Right 10/26/2015   Procedure: RELEASE A-1 PULLEY RIGHT RING FINGER;  Surgeon: Daryll Brod, MD;  Location: [place];  Service: Orthopedics;  Laterality: Right;    There were no vitals filed for this visit.  Subjective Assessment - 03/16/18 1034    Subjective   S:  I have a bit of soreness not too much though.    Currently in Pain?  No/denies          Diagnosis: Wrist and Hand Weakness         OT Treatments/Exercises (OP) - 03/16/18 0001      Exercises   Exercises  Wrist;Hand;Theraputty;Elbow      Elbow Exercises   Forearm Supination  Strengthening;15 reps    Bar Weights/Barbell (Forearm Supination)  1 lb    Forearm Pronation  Strengthening;15 reps    Bar Weights/Barbell (Forearm Pronation)  1 lb      Additional Elbow Exercises   Sponges  23, 19    Theraputty - Flatten  pink in standing and then completed wrist stability press and pull with PVC 5 times    Theraputty - Roll  pink seated    Theraputty - Grip  pink supinated and pronated grip  Wrist Exercises   Wrist Flexion  Strengthening;15 reps    Bar Weights/Barbell (Wrist Flexion)  1 lb    Wrist Extension  Strengthening;15 reps    Bar Weights/Barbell (Wrist Extension)  1 lb    Wrist Radial Deviation  Strengthening;15 reps    Bar Weights/Barbell (Radial Deviation)  1 lb    Wrist Ulnar Deviation  Strengthening;15 reps    Bar Weights/Barbell (Ulnar Deviation)  1 lb      Theraputty   Theraputty Hand- Locate Pegs  10 beads with right hand                OT Short Term Goals - 03/13/18 1142      OT SHORT TERM GOAL #1   Title  Patient will be educated and independent with a HEP for improved right wrist and hand strength.      Time  4    Period  Weeks    Status  On-going      OT SHORT TERM GOAL #2   Title  Patient will increase lateral and three point pinch strength by at least 4 pounds to more  easily complete arts and crafts.     Time  4    Period  Weeks    Status  On-going      OT SHORT TERM GOAL #3   Title  Patient will improve right wrist strength to at least 4+/5 for improved ability to complete gardening tasks.      Time  4    Period  Weeks    Status  On-going      OT SHORT TERM GOAL #4   Title  Patient will increase grip strength to 65 pounds or higher in order to grasp toothbrush more easily during oral care.      Time  4    Period  Weeks    Status  On-going               Plan - 03/16/18 1037    Clinical Impression Statement  A:  Patient increased to 15 repetitions with wrist strengthening with 1#.  added sponge activity for grip strengthening and hand manipulation, as well as pvc pipe pull for wrist stability.      OT Treatment/Interventions  Self-care/ADL training;Therapeutic exercise;Ultrasound;Manual Therapy;Therapeutic activities;Cryotherapy;Paraffin;DME and/or AE instruction;Moist Heat;Contrast Bath;Patient/family education    Plan  P:  Increase to 2# with wrist strengthening, upgrade putty for home to pink.         Patient will benefit from skilled therapeutic intervention in order to improve the following deficits and impairments:  Decreased activity tolerance, Decreased strength, Impaired UE functional use, Decreased coordination  Visit Diagnosis: Wrist weakness  Hand weakness  Other symptoms and signs involving the musculoskeletal system    Problem List Patient Active Problem List   Diagnosis Date Noted  . IBS (irritable bowel syndrome) 02/05/2018  . Diarrhea 06/16/2017  . Nausea without vomiting 06/16/2017  . Numbness 08/18/2015  . Polyneuropathy 08/18/2015  . Menopausal syndrome (hot flushes) 06/15/2015  . Tremor, essential 06/15/2015  . Gastroesophageal reflux disease with esophagitis 06/15/2015  . Bilateral carpal tunnel syndrome 06/15/2015  . Resting tremor 06/15/2015  . Scoliosis of thoracic spine 11/05/2013  . Spinal  stenosis, unspecified region other than cervical 12/03/2012  . Spondylosis of unspecified site without mention of myelopathy 12/03/2012  . Parkinson disease ([place])   . Back pain   . Seasonal allergies     Vangie Bicker, [place], OTR/L [phone]  03/16/2018, 10:40  [place] [address] [place], [place], [address] Phone: [phone]   Fax:  [phone]  Name: [name] MRN: [id number] Date of Birth: 05/28/1934

## 2018-03-18 ENCOUNTER — Ambulatory Visit (HOSPITAL_COMMUNITY): Payer: Medicare Other

## 2018-03-18 ENCOUNTER — Encounter (HOSPITAL_COMMUNITY): Payer: Self-pay

## 2018-03-18 DIAGNOSIS — R29898 Other symptoms and signs involving the musculoskeletal system: Secondary | ICD-10-CM | POA: Diagnosis not present

## 2018-03-18 NOTE — Therapy (Signed)
[place], [place], [address] Phone: [phone]   Fax:  [phone]  Occupational Therapy Treatment  Patient Details  Name: [name] MRN: [id number] Date of Birth: [birth year] Referring Provider (OT): Larey Seat, MD   Encounter Date: 03/18/2018  OT End of Session - 03/18/18 1252    Visit Number  4    Number of Visits  8    Date for OT Re-Evaluation  04/07/18    Authorization Type  1) U[employer] Medicare with $35 copay    OT Start Time  1120   pt requested to leave early   OT Stop Time  1153    OT Time Calculation (min)  33 min    Activity Tolerance  Patient tolerated treatment well    Behavior During Therapy  [facility] for tasks assessed/performed       Past Medical History:  Diagnosis Date  . Ankle swelling    takes torsemide prn  . Anxiety   . Arthritis   . Asthma    allergy induced  . Back pain   . Complication of anesthesia   . Depression   . GERD (gastroesophageal reflux disease)   . Hypercholesterolemia   . Hypothyroid   . Parkinson disease ([place])   . PONV (postoperative nausea and vomiting)   . Seasonal allergies     Past Surgical History:  Procedure Laterality Date  . ABDOMINAL HYSTERECTOMY    . CARPAL TUNNEL RELEASE Right 10/26/2015   Procedure: CARPAL TUNNEL RELEASE;  Surgeon: Daryll Brod, MD;  Location: [place];  Service: Orthopedics;  Laterality: Right;  . CARPOMETACARPEL SUSPENSION PLASTY Right 10/26/2015   Procedure: RIGHT HAND TRAPEZIECTOMY WITH THUMB METACARPEL SUSPENSION PLASTY;  Surgeon: Daryll Brod, MD;  Location: [place];  Service: Orthopedics;  Laterality: Right;  . CATARACT EXTRACTION W/PHACO  12/23/2011   Procedure: CATARACT EXTRACTION PHACO AND INTRAOCULAR LENS PLACEMENT (IOC);  Surgeon: Williams Che, MD;  Location: [facility];  Service: Ophthalmology;  Laterality: Right;  CDE=5.16  . CHOLECYSTECTOMY    . EYE SURGERY     cataract extraction of  left eye-[facility]-2002  . KNEE ARTHROSCOPY     right  . NISSEN FUNDOPLICATION     [facility]  . TRIGGER FINGER RELEASE Right 10/26/2015   Procedure: RELEASE A-1 PULLEY RIGHT RING FINGER;  Surgeon: Daryll Brod, MD;  Location: [place];  Service: Orthopedics;  Laterality: Right;    There were no vitals filed for this visit.  Subjective Assessment - 03/18/18 1125    Subjective   S: The cold weather is making my hand and wrist hurt.     Currently in Pain?  Yes    Pain Score  2     Pain Location  Hand    Pain Orientation  Right    Pain Descriptors / Indicators  Nagging    Pain Type  Acute pain    Pain Radiating Towards  thumb to back of hand.     Pain Onset  Today    Pain Frequency  Constant    Aggravating Factors   cooler weather    Pain Relieving Factors  unknown    Effect of Pain on Daily Activities  Min effect    Multiple Pain Sites  No         [facility] OT Assessment - 03/18/18 1127      Assessment   Medical Diagnosis  Wrist and hand weakness  Precautions   Precautions  None               OT Treatments/Exercises (OP) - 03/18/18 1127      Exercises   Exercises  Wrist;Hand;Theraputty;Elbow      Elbow Exercises   Forearm Supination  Strengthening;10 reps    Bar Weights/Barbell (Forearm Supination)  2 lbs    Forearm Pronation  Strengthening;10 reps    Bar Weights/Barbell (Forearm Pronation)  2 lbs      Additional Elbow Exercises   Theraputty - Flatten  red - standing    Theraputty - Roll  red    Hand Gripper with Large Beads  all beads with gripper set at 37#   horizontal   Hand Gripper with Medium Beads  all beads with gripper set at 37#   horizontal   Hand Gripper with Small Beads  all beads with gripper set at 37#   horizontal     Wrist Exercises   Wrist Flexion  Strengthening;10 reps    Bar Weights/Barbell (Wrist Flexion)  2 lbs    Wrist Extension  Strengthening;10 reps    Bar Weights/Barbell (Wrist Extension)  2 lbs    Wrist Radial  Deviation  Strengthening;10 reps    Bar Weights/Barbell (Radial Deviation)  2 lbs    Wrist Ulnar Deviation  Strengthening;10 reps    Bar Weights/Barbell (Ulnar Deviation)  2 lbs      Theraputty   Theraputty - Pinch  3 point and lateral - red               OT Short Term Goals - 03/13/18 1142      OT SHORT TERM GOAL #1   Title  Patient will be educated and independent with a HEP for improved right wrist and hand strength.      Time  4    Period  Weeks    Status  On-going      OT SHORT TERM GOAL #2   Title  Patient will increase lateral and three point pinch strength by at least 4 pounds to more easily complete arts and crafts.     Time  4    Period  Weeks    Status  On-going      OT SHORT TERM GOAL #3   Title  Patient will improve right wrist strength to at least 4+/5 for improved ability to complete gardening tasks.      Time  4    Period  Weeks    Status  On-going      OT SHORT TERM GOAL #4   Title  Patient will increase grip strength to 65 pounds or higher in order to grasp toothbrush more easily during oral care.      Time  4    Period  Weeks    Status  On-going               Plan - 03/18/18 1252    Clinical Impression Statement  A: Progressed to 2# wrist weights and was provided wiht red theraputty to upgrade for HEP. Patient required min VC for form and technique as needed. Pt reports that the cooler weather is making her fingers more sore today.     Plan  P: Continue with grip and pinch strengthening. Follow up on use of red putty for HEP.     Consulted and Agree with Plan of Care  Patient       Patient will benefit from skilled therapeutic intervention in  order to improve the following deficits and impairments:  Decreased activity tolerance, Decreased strength, Impaired UE functional use, Decreased coordination  Visit Diagnosis: Other symptoms and signs involving the musculoskeletal system    Problem List Patient Active Problem List    Diagnosis Date Noted  . IBS (irritable bowel syndrome) 02/05/2018  . Diarrhea 06/16/2017  . Nausea without vomiting 06/16/2017  . Numbness 08/18/2015  . Polyneuropathy 08/18/2015  . Menopausal syndrome (hot flushes) 06/15/2015  . Tremor, essential 06/15/2015  . Gastroesophageal reflux disease with esophagitis 06/15/2015  . Bilateral carpal tunnel syndrome 06/15/2015  . Resting tremor 06/15/2015  . Scoliosis of thoracic spine 11/05/2013  . Spinal stenosis, unspecified region other than cervical 12/03/2012  . Spondylosis of unspecified site without mention of myelopathy 12/03/2012  . Parkinson disease ([place])   . Back pain   . Seasonal allergies    Ailene Ravel, OTR/L,CBIS  [phone]  03/18/2018, 12:54 PM  [facility] [address] [place], [place], [address] Phone: [phone]   Fax:  [phone]  Name: [name] MRN: [id number] Date of Birth: 07-16-1933

## 2018-03-20 ENCOUNTER — Other Ambulatory Visit: Payer: Self-pay

## 2018-03-20 ENCOUNTER — Encounter (HOSPITAL_COMMUNITY): Payer: Self-pay | Admitting: Physical Therapy

## 2018-03-20 ENCOUNTER — Ambulatory Visit (HOSPITAL_COMMUNITY): Payer: Medicare Other | Admitting: Physical Therapy

## 2018-03-20 DIAGNOSIS — R42 Dizziness and giddiness: Secondary | ICD-10-CM

## 2018-03-20 DIAGNOSIS — R2681 Unsteadiness on feet: Secondary | ICD-10-CM

## 2018-03-20 DIAGNOSIS — R29898 Other symptoms and signs involving the musculoskeletal system: Secondary | ICD-10-CM | POA: Diagnosis not present

## 2018-03-20 NOTE — Therapy (Signed)
[place], [place], [address] Phone: [phone]   Fax:  [phone]  Physical Therapy Evaluation  Patient Details  Name: [name] MRN: [id number] Date of Birth: 21-May-1934 Referring Provider (PT): [name]    Encounter Date: 03/20/2018  PT End of Session - 03/20/18 1221    Visit Number  1    Number of Visits  12    Date for PT Re-Evaluation  04/19/18    Authorization Type  UHC    PT Start Time  1130    PT Stop Time  1215    PT Time Calculation (min)  45 min    Activity Tolerance  Patient tolerated treatment well    Behavior During Therapy  [facility] for tasks assessed/performed       Past Medical History:  Diagnosis Date  . Ankle swelling    takes torsemide prn  . Anxiety   . Arthritis   . Asthma    allergy induced  . Back pain   . Complication of anesthesia   . Depression   . GERD (gastroesophageal reflux disease)   . Hypercholesterolemia   . Hypothyroid   . Parkinson disease ([name])   . PONV (postoperative nausea and vomiting)   . Seasonal allergies     Past Surgical History:  Procedure Laterality Date  . ABDOMINAL HYSTERECTOMY    . CARPAL TUNNEL RELEASE Right 10/26/2015   Procedure: CARPAL TUNNEL RELEASE;  Surgeon: Joanne Brod, MD;  Location: [place];  Service: Orthopedics;  Laterality: Right;  . CARPOMETACARPEL SUSPENSION PLASTY Right 10/26/2015   Procedure: RIGHT HAND TRAPEZIECTOMY WITH THUMB METACARPEL SUSPENSION PLASTY;  Surgeon: Joanne Brod, MD;  Location: [place];  Service: Orthopedics;  Laterality: Right;  . CATARACT EXTRACTION W/PHACO  12/23/2011   Procedure: CATARACT EXTRACTION PHACO AND INTRAOCULAR LENS PLACEMENT (IOC);  Surgeon: Joanne Che, MD;  [facility];  Service: Ophthalmology;  Laterality: Right;  CDE=5.16  . CHOLECYSTECTOMY    . EYE SURGERY     cataract extraction of left eye-[facility]-2002  . KNEE ARTHROSCOPY     right  . NISSEN  FUNDOPLICATION     [facility]  . TRIGGER FINGER RELEASE Right 10/26/2015   Procedure: RELEASE A-1 PULLEY RIGHT RING FINGER;  Surgeon: Joanne Brod, MD;  Location: [place];  Service: Orthopedics;  Laterality: Right;    There were no vitals filed for this visit.   Subjective Assessment - 03/20/18 1134    Subjective  Ms. [name] is an 84 yo female who has been referred to skilled physical therapy for Parkinson's.  She states that she has notices that she has had more dizziness than usual.  She has fallen several times.  She has taken meclazine.      Pertinent History  back pain, PT has had surgery in her LT eye and only has peripheral vision in that eye     How long can you sit comfortably?  no problam     How long can you stand comfortably?  no problem     How long can you walk comfortably?  not sure but at times she walks sideways.      Currently in Pain?  No/denies         [facility] PT Assessment - 03/20/18 0001      Assessment   Medical Diagnosis  Parkinson with dizziness     Referring Provider (PT)  [name]     Onset  Date/Surgical Date  02/08/18    Next MD Visit  05/11/2018    Prior Therapy  for other reasons       Precautions   Precautions  Fall      Restrictions   Weight Bearing Restrictions  No      Balance Screen   Has the patient fallen in the past 6 months  Yes    How many times?  not sure 1 x a week     Has the patient had a decrease in activity level because of a fear of falling?   No    Is the patient reluctant to leave their home because of a fear of falling?   No      Home Film/video editor residence      Prior Function   Level of Independence  Independent    Vocation  Retired    Office manager, gardening, Control and instrumentation engineer and cooking, arts and crafts (Dietitian), going to church       Cognition   Overall Cognitive Status  Within Functional Limits for tasks assessed      Observation/Other Assessments   Focus on Therapeutic  Outcomes (FOTO)   55      Functional Tests   Functional tests  Single leg stance;Sit to Stand      Single Leg Stance   Comments  RT:   1   LT:  1      Sit to Stand   Comments  5 x 36.88      ROM / Strength   AROM / PROM / Strength  AROM      AROM   AROM Assessment Site  Cervical    Cervical - Right Rotation  50    Cervical - Left Rotation  50           Vestibular Assessment - 03/20/18 0001      Symptom Behavior   Type of Dizziness  Unsteady with head/body turns    Frequency of Dizziness  4x a day     Duration of Dizziness  seconds     Aggravating Factors  Turning body quickly;Turning head quickly;Forward bending    Relieving Factors  Rest      Occulomotor Exam   Occulomotor Alignment  Normal    Head shaking Horizontal  L beating nystagmus    Smooth Pursuits  Saccades    Saccades  Poor trajectory      Vestibulo-Occular Reflex   VOR 2 Head and Object (x 2 viewing)  mild dizziness       Positional Sensitivities   Head Turning x 5  Mild dizziness    Head Nodding x 5  Moderate dizziness          Objective measurements completed on examination: See above findings.       Vestibular Treatment/Exercise - 03/20/18 0001      Vestibular Treatment/Exercise   Habituation Exercises  Seated Horizontal Head Turns;Seated Vertical Head Turns    Gaze Exercises  X2 Viewing Horizontal;Eye/Head Exercise Horizontal;Eye/Head Exercise Vertical      Seated Horizontal Head Turns   Number of Reps   5    Symptom Description   mild      Seated Vertical Head Turns   Number of Reps   5    Symptom Description   moderate      X2 Viewing Horizontal   Foot Position  on floor pt sitting    Reps  5  Comments  mild      Eye/Head Exercise Horizontal   Foot Position  on floor pt sitting     Reps  5    Comments  mild      Eye/Head Exercise Vertical   Foot Position  on floor pt sittng     Reps  5    Comments  moderate             PT Education - 03/20/18 1218     Education Details  HEp    Person(s) Educated  Patient    Methods  Explanation    Comprehension  Verbalized understanding       PT Short Term Goals - 03/20/18 1303      PT SHORT TERM GOAL #1   Title  PT to have loss of balance incidents 2x a day or less to reduce risk of falling     Time  2    Period  Weeks    Status  New    Target Date  04/03/18      PT SHORT TERM GOAL #2   Title  PT to be able to single leg stance on both legs for at least 8 seconds to reduce risk of falling     Time  2    Period  Weeks    Status  New      PT SHORT TERM GOAL #3   Title  PT to be performing basic habituation exercises to reduce episodes of dizziness     Time  2    Period  Weeks    Status  New        PT Long Term Goals - 03/20/18 1305      PT LONG TERM GOAL #1   Title  PT to have episodes of dizziness 3 or less times a week.     Time  4    Period  Weeks    Status  New    Target Date  04/17/18      PT LONG TERM GOAL #2   Title  PT to be able to single leg stance B for at least 12 seconds to decrease risk of falling     Time  2    Period  Weeks    Status  New      PT LONG TERM GOAL #3   Title  Pt to be I in an advanced balance exercise program to reduce risk of falling     Time  4    Period  Weeks    Status  New             Plan - 03/20/18 1224    Clinical Impression Statement  Ms. Soliday is an 84 yo female who has parkinsons disease.  She has been diagnosed with central vertigo.  She states that for the past six weeks her equilibrium has been deteriorating with approximately 4 loss of balances a day.  She feels lucky that she has not broken a bone yet.  She has been referred to skilled physcial therapy to assist in improving her balance and decreasing her falls.  Evaluation demonstrates incresed vertigo with eye motion, as well as neck motion, postrual dysfunction and decreased balance.  Ms. Penland will benefit from skilled physical therapy to address these issues and  maximize her functional ability.      History and Personal Factors relevant to plan of care:  back pain, Lt eye has peripheral vision only  Clinical Presentation  Evolving    Clinical Decision Making  Moderate    Rehab Potential  Good    PT Frequency  3x / week    PT Duration  4 weeks    PT Treatment/Interventions  ADLs/Self Care Home Management;Balance training;Neuromuscular re-education;Patient/family education;Therapeutic exercise;Therapeutic activities;Manual techniques    PT Next Visit Plan  progress eye and head activity to standing; begin sitting nose to knee, begin and progress balance activity.  Manual for cervical with goal of improving cervical rotation.     PT Home Exercise Plan  sitting eye exercises, cervical ROM, scapular and cervical retraction,     Consulted and Agree with Plan of Care  Patient       Patient will benefit from skilled therapeutic intervention in order to improve the following deficits and impairments:  Decreased balance, Decreased range of motion, Decreased strength, Dizziness, Postural dysfunction  Visit Diagnosis: Unsteadiness on feet  Dizziness and giddiness     Problem List Patient Active Problem List   Diagnosis Date Noted  . IBS (irritable bowel syndrome) 02/05/2018  . Diarrhea 06/16/2017  . Nausea without vomiting 06/16/2017  . Numbness 08/18/2015  . Polyneuropathy 08/18/2015  . Menopausal syndrome (hot flushes) 06/15/2015  . Tremor, essential 06/15/2015  . Gastroesophageal reflux disease with esophagitis 06/15/2015  . Bilateral carpal tunnel syndrome 06/15/2015  . Resting tremor 06/15/2015  . Scoliosis of thoracic spine 11/05/2013  . Spinal stenosis, unspecified region other than cervical 12/03/2012  . Spondylosis of unspecified site without mention of myelopathy 12/03/2012  . Parkinson disease ([place])   . Back pain   . Seasonal allergies    Rayetta Humphrey, [place] CLT [phone] 03/20/2018, 1:08 PM  [facility] [address] [place], [place], [address] Phone: [phone]   Fax:  [phone]  Name: [name] MRN: [id number] Date of Birth: 11/08/1933

## 2018-03-20 NOTE — Patient Instructions (Addendum)
Movements: Eyes Only (Pictorial Reference)    Therapist: Use this card with Eye Exercises 14 through 17.  Move your eyes right and left 5-10 x 2-3 times a day; now up and down. Now diagonal  Copyright  VHI. All rights reserved.  Oculomotor: Saccades    Holding two targets positioned side by side _12___ inches apart, move eyes quickly from target to target as head stays still. Move __5-10__ each direction. Perform sitting. Repeat _2___ times per session. Do ___2_ sessions per day.  Copyright  VHI. All rights reserved.  Scapular Retraction (Standing)   Do sitting  With arms at sides, pinch shoulder blades together. Repeat _10___ times per set. Do ___1_ sets per session. Do _2___ sessions per day.  http://orth.exer.us/944   Copyright  VHI. All rights reserved.  Flexibility: Neck Retraction    Pull head straight back, keeping eyes and jaw level. Repeat _10___ times per set. Do _1___ sets per session. Do ____ s2essions per day.  http://orth.exer.us/344   Copyright  VHI. All rights reserved.  AROM: Neck Rotation    Turn head slowly to look over one shoulder, then the other. Hold each position __3__ seconds. Repeat __5__ times per set. Do _1___ sets per session. Do ___2_ sessions per day.  http://orth.exer.us/294   Copyright  VHI. All rights reserved.  Head Motion: Up and Down    Sitting, slowly move head up with eyes open. Hold position until symptoms subside. Then, move head in opposite direction. Hold position until symptoms subside. Repeat _5-10___ times per session. Do __3__ sessions per day.  Copyright  VHI. All rights reserved.

## 2018-03-20 NOTE — Addendum Note (Signed)
Addended by: Leeroy Cha on: 03/20/2018 02:30 PM   Modules accepted: Orders

## 2018-03-23 ENCOUNTER — Ambulatory Visit (HOSPITAL_COMMUNITY): Payer: Medicare Other

## 2018-03-23 ENCOUNTER — Encounter (HOSPITAL_COMMUNITY): Payer: Self-pay

## 2018-03-23 DIAGNOSIS — R29898 Other symptoms and signs involving the musculoskeletal system: Secondary | ICD-10-CM | POA: Diagnosis not present

## 2018-03-23 NOTE — Therapy (Signed)
[place], [place], [address] Phone: [phone]   Fax:  [phone]  Occupational Therapy Treatment  Patient Details  Name: [name] MRN: [id number] Date of Birth: 06-Feb-1934 Referring Provider (OT): Larey Seat, MD   Encounter Date: 03/23/2018  OT End of Session - 03/23/18 1107    Visit Number  5    Number of Visits  8    Date for OT Re-Evaluation  04/07/18    Authorization Type  1) UHC Medicare with $35 copay    OT Start Time  5400   patient arrived late   OT Stop Time  1115    OT Time Calculation (min)  32 min    Activity Tolerance  Patient tolerated treatment well    Behavior During Therapy  [facility] for tasks assessed/performed       Past Medical History:  Diagnosis Date  . Ankle swelling    takes torsemide prn  . Anxiety   . Arthritis   . Asthma    allergy induced  . Back pain   . Complication of anesthesia   . Depression   . GERD (gastroesophageal reflux disease)   . Hypercholesterolemia   . Hypothyroid   . Parkinson disease ([place])   . PONV (postoperative nausea and vomiting)   . Seasonal allergies     Past Surgical History:  Procedure Laterality Date  . ABDOMINAL HYSTERECTOMY    . CARPAL TUNNEL RELEASE Right 10/26/2015   Procedure: CARPAL TUNNEL RELEASE;  Surgeon: Daryll Brod, MD;  Location: [place];  Service: Orthopedics;  Laterality: Right;  . CARPOMETACARPEL SUSPENSION PLASTY Right 10/26/2015   Procedure: RIGHT HAND TRAPEZIECTOMY WITH THUMB METACARPEL SUSPENSION PLASTY;  Surgeon: Daryll Brod, MD;  Location: [place];  Service: Orthopedics;  Laterality: Right;  . CATARACT EXTRACTION W/PHACO  12/23/2011   Procedure: CATARACT EXTRACTION PHACO AND INTRAOCULAR LENS PLACEMENT (IOC);  Surgeon: Williams Che, MD;  [facility];  Service: Ophthalmology;  Laterality: Right;  CDE=5.16  . CHOLECYSTECTOMY    . EYE SURGERY     cataract extraction of left  eye-[facility]-2002  . KNEE ARTHROSCOPY     right  . NISSEN FUNDOPLICATION     [facility]  . TRIGGER FINGER RELEASE Right 10/26/2015   Procedure: RELEASE A-1 PULLEY RIGHT RING FINGER;  Surgeon: Daryll Brod, MD;  Location: [place];  Service: Orthopedics;  Laterality: Right;    There were no vitals filed for this visit.  Subjective Assessment - 03/23/18 1046    Subjective   S: My forearm is a little sore but not in my wrist.     Currently in Pain?  No/denies         [facility] OT Assessment - 03/23/18 1046      Assessment   Medical Diagnosis  Wrist and hand weakness    Referring Provider (OT)  Larey Seat, MD      Precautions   Precautions  Fall               OT Treatments/Exercises (OP) - 03/23/18 1047      Exercises   Exercises  Wrist;Hand;Theraputty;Elbow      Elbow Exercises   Forearm Supination  Strengthening   12X   Bar Weights/Barbell (Forearm Supination)  2 lbs    Forearm Pronation  Strengthening   12X   Bar Weights/Barbell (Forearm Pronation)  2 lbs      Additional Elbow Exercises   Theraputty -  Flatten  red - standing    Theraputty - Roll  red    Theraputty - Grip  red- prontated      Wrist Exercises   Wrist Flexion  Strengthening   12X   Bar Weights/Barbell (Wrist Flexion)  2 lbs    Wrist Extension  Strengthening   12X   Bar Weights/Barbell (Wrist Extension)  2 lbs    Wrist Radial Deviation  Strengthening   12X   Bar Weights/Barbell (Radial Deviation)  2 lbs    Wrist Ulnar Deviation  Strengthening   12X   Bar Weights/Barbell (Ulnar Deviation)  2 lbs    Other wrist exercises  Sponges: 21, 25      Additional Wrist Exercises   Sponges  patient utilized green resistive using a 3 point pinch to pick up 30 sponges and place in container.       Hand Exercises   Other Hand Exercises  Patient used PVC pipe to cut circles into red putty focusing on wrist flexion and extension strength.      Theraputty   Theraputty - Pinch  3 point and  lateral pinch - red               OT Short Term Goals - 03/13/18 1142      OT SHORT TERM GOAL #1   Title  Patient will be educated and independent with a HEP for improved right wrist and hand strength.      Time  4    Period  Weeks    Status  On-going      OT SHORT TERM GOAL #2   Title  Patient will increase lateral and three point pinch strength by at least 4 pounds to more easily complete arts and crafts.     Time  4    Period  Weeks    Status  On-going      OT SHORT TERM GOAL #3   Title  Patient will improve right wrist strength to at least 4+/5 for improved ability to complete gardening tasks.      Time  4    Period  Weeks    Status  On-going      OT SHORT TERM GOAL #4   Title  Patient will increase grip strength to 65 pounds or higher in order to grasp toothbrush more easily during oral care.      Time  4    Period  Weeks    Status  On-going               Plan - 03/23/18 1115    Clinical Impression Statement  A: remained with 2# hand weight although progressed to 12 repetitions for wrist strengthening. Patient was able to complete all sponges with the green clothespin with several rest breaks. VC for form and technique as needed. Patient reports that her wrist is getting better although the pain and soreness she has been experiencing may be related to her CTS.,     Plan  P: Continue with grip and pinch strengthening.     Consulted and Agree with Plan of Care  Patient       Patient will benefit from skilled therapeutic intervention in order to improve the following deficits and impairments:  Decreased activity tolerance, Decreased strength, Impaired UE functional use, Decreased coordination  Visit Diagnosis: Other symptoms and signs involving the musculoskeletal system  Wrist weakness  Hand weakness    Problem List Patient Active Problem List   Diagnosis  Date Noted  . IBS (irritable bowel syndrome) 02/05/2018  . Diarrhea 06/16/2017  . Nausea  without vomiting 06/16/2017  . Numbness 08/18/2015  . Polyneuropathy 08/18/2015  . Menopausal syndrome (hot flushes) 06/15/2015  . Tremor, essential 06/15/2015  . Gastroesophageal reflux disease with esophagitis 06/15/2015  . Bilateral carpal tunnel syndrome 06/15/2015  . Resting tremor 06/15/2015  . Scoliosis of thoracic spine 11/05/2013  . Spinal stenosis, unspecified region other than cervical 12/03/2012  . Spondylosis of unspecified site without mention of myelopathy 12/03/2012  . Parkinson disease ([place])   . Back pain   . Seasonal allergies    Ailene Ravel, OTR/L,CBIS  [phone]  03/23/2018, 11:45 AM  [place], [place], [address] Phone: [phone]   Fax:  [phone]  Name: [name] MRN: [id number] Date of Birth: 01-Oct-1933

## 2018-03-25 ENCOUNTER — Ambulatory Visit (HOSPITAL_COMMUNITY): Payer: Medicare Other | Admitting: Occupational Therapy

## 2018-03-25 ENCOUNTER — Encounter (HOSPITAL_COMMUNITY): Payer: Self-pay | Admitting: Occupational Therapy

## 2018-03-25 ENCOUNTER — Ambulatory Visit (HOSPITAL_COMMUNITY): Payer: Medicare Other

## 2018-03-25 ENCOUNTER — Encounter (HOSPITAL_COMMUNITY): Payer: Self-pay

## 2018-03-25 DIAGNOSIS — R2681 Unsteadiness on feet: Secondary | ICD-10-CM

## 2018-03-25 DIAGNOSIS — R42 Dizziness and giddiness: Secondary | ICD-10-CM

## 2018-03-25 DIAGNOSIS — R29898 Other symptoms and signs involving the musculoskeletal system: Secondary | ICD-10-CM

## 2018-03-25 NOTE — Therapy (Signed)
[place] [address] [place], [place], [address] Phone: [phone]   Fax:  [phone]  Physical Therapy Treatment  Patient Details  Name: [name] MRN: [id number] Date of Birth: [birth year] Referring Provider (PT): Velia Meyer    Encounter Date: 03/25/2018  PT End of Session - 03/25/18 1037    Visit Number  2    Number of Visits  12    Date for PT Re-Evaluation  04/19/18    Authorization Type  UHC    Authorization Time Period  10/11-->04/18/18    PT Start Time  1031    PT Stop Time  1115    PT Time Calculation (min)  44 min    Activity Tolerance  Patient tolerated treatment well    Behavior During Therapy  [facility] for tasks assessed/performed       Past Medical History:  Diagnosis Date  . Ankle swelling    takes torsemide prn  . Anxiety   . Arthritis   . Asthma    allergy induced  . Back pain   . Complication of anesthesia   . Depression   . GERD (gastroesophageal reflux disease)   . Hypercholesterolemia   . Hypothyroid   . Parkinson disease ([place])   . PONV (postoperative nausea and vomiting)   . Seasonal allergies     Past Surgical History:  Procedure Laterality Date  . ABDOMINAL HYSTERECTOMY    . CARPAL TUNNEL RELEASE Right 10/26/2015   Procedure: CARPAL TUNNEL RELEASE;  Surgeon: Daryll Brod, MD;  Location: [place];  Service: Orthopedics;  Laterality: Right;  . CARPOMETACARPEL SUSPENSION PLASTY Right 10/26/2015   Procedure: RIGHT HAND TRAPEZIECTOMY WITH THUMB METACARPEL SUSPENSION PLASTY;  Surgeon: Daryll Brod, MD;  Location: [place];  Service: Orthopedics;  Laterality: Right;  . CATARACT EXTRACTION W/PHACO  12/23/2011   Procedure: CATARACT EXTRACTION PHACO AND INTRAOCULAR LENS PLACEMENT (IOC);  Surgeon: Williams Che, MD;  [facility]: [facility];  Service: Ophthalmology;  Laterality: Right;  CDE=5.16  . CHOLECYSTECTOMY    . EYE SURGERY     cataract extraction of left  eye-[facility]-2002  . KNEE ARTHROSCOPY     right  . NISSEN FUNDOPLICATION     [facility]  . TRIGGER FINGER RELEASE Right 10/26/2015   Procedure: RELEASE A-1 PULLEY RIGHT RING FINGER;  Surgeon: Daryll Brod, MD;  Location: [place];  Service: Orthopedics;  Laterality: Right;    There were no vitals filed for this visit.  Subjective Assessment - 03/25/18 1033    Subjective  Pt stated she continues to be dizzy, current dizzy level at 2-3/5 stated she has headache this morning.  Reports she fell yesterday in her yard, was able to get up by herself slowly with use of walking stick.      Currently in Pain?  No/denies   headache 3/10                      [facility] Adult PT Treatment/Exercise - 03/25/18 1042        Manual Therapy   Manual Therapy  Soft tissue mobilization    Manual therapy comments  Manual complete separate than rest of tx    Soft tissue mobilization  Cervical mm in supine with LE elevated      Vestibular Treatment/Exercise - 03/25/18 0001      Vestibular Treatment/Exercise   Habituation Exercises  Seated Diagonal Head Turns;Standing Horizontal Head Turns    Gaze Exercises  X2 Viewing Horizontal;X2 Viewing Vertical;Eye/Head Exercise Horizontal;Eye/Head Exercise Vertical      Seated Vertical Head Turns   Symptom Description   chin to knee 5x both sides      Standing Horizontal Head Turns   Number of Reps   10    Symptom Description   NBOS solid then foam; tandem on foam      X2 Viewing Horizontal   Foot Position  NBOS solid then foam; tandem on foam    Time  1000     Comments  mild      X2 Viewing Vertical   Foot Position  NBOS solid then foam; tandem on foam    Reps  10    Comments  no s/s      Eye/Head Exercise Horizontal   Foot Position  NBOS solid then foam; tandem on foam    Reps  10    Comments  mild      Eye/Head Exercise Vertical   Foot Position  NBOS solid then foam; tandem on foam    Reps  10    Comments  no s/s with vertical          Balance Exercises - 03/25/18 1115      Balance Exercises: Standing   Standing Eyes Opened  Narrow base of support (BOS);Solid surface;Foam/compliant surface;2 reps   NBOS eye then head turns, then on foam for 2nd set   Tandem Stance  Eyes open;Foam/compliant surface;2 reps   eye then head turns tandem stance; 2 point eye movements   SLS  4 reps   Lt 7", Rt 12" max         PT Short Term Goals - 03/20/18 1303      PT SHORT TERM GOAL #1   Title  PT to have loss of balance incidents 2x a day or less to reduce risk of falling     Time  2    Period  Weeks    Status  New    Target Date  04/03/18      PT SHORT TERM GOAL #2   Title  PT to be able to single leg stance on both legs for at least 8 seconds to reduce risk of falling     Time  2    Period  Weeks    Status  New      PT SHORT TERM GOAL #3   Title  PT to be performing basic habituation exercises to reduce episodes of dizziness     Time  2    Period  Weeks    Status  New        PT Long Term Goals - 03/20/18 1305      PT LONG TERM GOAL #1   Title  PT to have episodes of dizziness 3 or less times a week.     Time  4    Period  Weeks    Status  New    Target Date  04/17/18      PT LONG TERM GOAL #2   Title  PT to be able to single leg stance B for at least 12 seconds to decrease risk of falling     Time  2    Period  Weeks    Status  New      PT LONG TERM GOAL #3   Title  Pt to be I in an advanced balance exercise program to reduce risk of falling     Time  4    Period  Weeks    Status  New            Plan - 03/25/18 1248    Clinical Impression Statement  Reviewed goals and assured compliance with HEP.  Session focus on balalnce training and techniques to reduce vestibular symptoms with dizziness.  Pt educated on improving focal awareness to reduce symptoms.  Added seated nose to knees and progressed to standing balance training.  MIn A for LOB with NBOS and dynamic surfaces for safety.  Pt  with difficulty following object wiht eyes, encouraged to continue eye exercises at home.  EOS with manual soft tissue mobilization to address restrictions cervical musculature, noted moderate spasms upper traps.  No reports of pain through session, was limited by fatigue with activities.      Rehab Potential  Good    PT Frequency  3x / week    PT Duration  4 weeks    PT Treatment/Interventions  ADLs/Self Care Home Management;Balance training;Neuromuscular re-education;Patient/family education;Therapeutic exercise;Therapeutic activities;Manual techniques    PT Next Visit Plan  progress eye and head activity to standing; begin sitting nose to knee, begin and progress balance activity.  Manual for cervical with goal of improving cervical rotation.     PT Home Exercise Plan  sitting eye exercises, cervical ROM, scapular and cervical retraction,        Patient will benefit from skilled therapeutic intervention in order to improve the following deficits and impairments:  Decreased balance, Decreased range of motion, Decreased strength, Dizziness, Postural dysfunction  Visit Diagnosis: Unsteadiness on feet  Dizziness and giddiness     Problem List Patient Active Problem List   Diagnosis Date Noted  . IBS (irritable bowel syndrome) 02/05/2018  . Diarrhea 06/16/2017  . Nausea without vomiting 06/16/2017  . Numbness 08/18/2015  . Polyneuropathy 08/18/2015  . Menopausal syndrome (hot flushes) 06/15/2015  . Tremor, essential 06/15/2015  . Gastroesophageal reflux disease with esophagitis 06/15/2015  . Bilateral carpal tunnel syndrome 06/15/2015  . Resting tremor 06/15/2015  . Scoliosis of thoracic spine 11/05/2013  . Spinal stenosis, unspecified region other than cervical 12/03/2012  . Spondylosis of unspecified site without mention of myelopathy 12/03/2012  . Parkinson disease ([place])   . Back pain   . Seasonal allergies    Ihor [place], [place]; [place]  Aldona Lento 03/25/2018, 12:56 PM  [place], [place], [address] Phone: [phone]   Fax:  [phone]  Name: NYSHA KOPLIN MRN: [id number] Date of Birth: 1933-10-03

## 2018-03-25 NOTE — Therapy (Addendum)
[place], [place], [address] Phone: [phone]   Fax:  [phone]  Occupational Therapy Treatment  Patient Details  Name: [name] MRN: [id number] Date of Birth: 24-Jun-1933 Referring Provider (OT): Larey Seat, MD   Encounter Date: 03/25/2018  OT End of Session - 03/25/18 1513    Visit Number  6    Number of Visits  8    Date for OT Re-Evaluation  04/07/18    Authorization Type  1) UHC Medicare with $35 copay    OT Start Time  0950    OT Stop Time  1030    OT Time Calculation (min)  40 min    Activity Tolerance  Patient tolerated treatment well    Behavior During Therapy  [facility] for tasks assessed/performed       Past Medical History:  Diagnosis Date  . Ankle swelling    takes torsemide prn  . Anxiety   . Arthritis   . Asthma    allergy induced  . Back pain   . Complication of anesthesia   . Depression   . GERD (gastroesophageal reflux disease)   . Hypercholesterolemia   . Hypothyroid   . Parkinson disease ([place])   . PONV (postoperative nausea and vomiting)   . Seasonal allergies     Past Surgical History:  Procedure Laterality Date  . ABDOMINAL HYSTERECTOMY    . CARPAL TUNNEL RELEASE Right 10/26/2015   Procedure: CARPAL TUNNEL RELEASE;  Surgeon: Daryll Brod, MD;  Location: [place];  Service: Orthopedics;  Laterality: Right;  . CARPOMETACARPEL SUSPENSION PLASTY Right 10/26/2015   Procedure: RIGHT HAND TRAPEZIECTOMY WITH THUMB METACARPEL SUSPENSION PLASTY;  Surgeon: Daryll Brod, MD;  Location: [place];  Service: Orthopedics;  Laterality: Right;  . CATARACT EXTRACTION W/PHACO  12/23/2011   Procedure: CATARACT EXTRACTION PHACO AND INTRAOCULAR LENS PLACEMENT (IOC);  Surgeon: Williams Che, MD;  [facility];  Service: Ophthalmology;  Laterality: Right;  CDE=5.16  . CHOLECYSTECTOMY    . EYE SURGERY     cataract extraction of left eye-[facility]-2002  . KNEE  ARTHROSCOPY     right  . NISSEN FUNDOPLICATION     [facility]  . TRIGGER FINGER RELEASE Right 10/26/2015   Procedure: RELEASE A-1 PULLEY RIGHT RING FINGER;  Surgeon: Daryll Brod, MD;  Location: [place];  Service: Orthopedics;  Laterality: Right;    There were no vitals filed for this visit.  Subjective Assessment - 03/25/18 1001    Subjective   S: I'm having pain in my otherthumb today.     Currently in Pain?  No/denies    Pain Score  0-No pain    Multiple Pain Sites  No         [facility] OT Assessment - 03/25/18 0953      Assessment   Medical Diagnosis  Wrist and hand weakness      Precautions   Precautions  Fall               OT Treatments/Exercises (OP) - 03/25/18 0001      Exercises   Exercises  Wrist;Hand;Theraputty;Elbow      Elbow Exercises   Forearm Supination  Strengthening;Other (comment)   12X   Bar Weights/Barbell (Forearm Supination)  2 lbs    Forearm Pronation  Strengthening;Other (comment)   12X   Bar Weights/Barbell (Forearm Pronation)  2 lbs      Additional Elbow Exercises   Theraputty -  Flatten  red - standing    Theraputty - Roll  Red Standing    Theraputty - Grip  red- prontated and supinated       Wrist Exercises   Wrist Flexion  Strengthening    Bar Weights/Barbell (Wrist Flexion)  2 lbs    Wrist Extension  Strengthening    Bar Weights/Barbell (Wrist Extension)  2 lbs    Wrist Radial Deviation  Strengthening    Bar Weights/Barbell (Radial Deviation)  2 lbs    Wrist Ulnar Deviation  Strengthening    Bar Weights/Barbell (Ulnar Deviation)  2 lbs      Additional Wrist Exercises   Sponges  Patient utilized green resistive pin (then graded down to red after initial 5 sponges) using a 3 point and lateral pinch to pick up 30 sponges. More difficulty  and fatigue noted with lateral pinch.       Hand Exercises   Other Hand Exercises  Finger extension exercises (all digits extended, 3 point extension, and thumb abduction: 10X each)  using small blue rubberband wrapped around twice, with mod fatigue noted.      Theraputty   Theraputty - Pinch  3 point and lateral pinch - red               OT Short Term Goals - 03/13/18 1142      OT SHORT TERM GOAL #1   Title  Patient will be educated and independent with a HEP for improved right wrist and hand strength.      Time  4    Period  Weeks    Status  On-going      OT SHORT TERM GOAL #2   Title  Patient will increase lateral and three point pinch strength by at least 4 pounds to more easily complete arts and crafts.     Time  4    Period  Weeks    Status  On-going      OT SHORT TERM GOAL #3   Title  Patient will improve right wrist strength to at least 4+/5 for improved ability to complete gardening tasks.      Time  4    Period  Weeks    Status  On-going      OT SHORT TERM GOAL #4   Title  Patient will increase grip strength to 65 pounds or higher in order to grasp toothbrush more easily during oral care.      Time  4    Period  Weeks    Status  On-going               Plan - 03/25/18 1507    Clinical Impression Statement  A: Continued with 2# hand weights for wrist strengthening. Added hand exercises using rubberband to work on pinch strength with mod fatigue noted. VC for form and technique as needed.      Plan  P: Continue with grip and pinch strengthening. Add new theraputty activity in which pt constructs a small sculpture with putty.     Consulted and Agree with Plan of Care  Patient       Patient will benefit from skilled therapeutic intervention in order to improve the following deficits and impairments:  Decreased activity tolerance, Decreased strength, Impaired UE functional use, Decreased coordination  Visit Diagnosis: Other symptoms and signs involving the musculoskeletal system  Wrist weakness  Hand weakness    Problem List Patient Active Problem List   Diagnosis Date Noted  .  IBS (irritable bowel syndrome) 02/05/2018   . Diarrhea 06/16/2017  . Nausea without vomiting 06/16/2017  . Numbness 08/18/2015  . Polyneuropathy 08/18/2015  . Menopausal syndrome (hot flushes) 06/15/2015  . Tremor, essential 06/15/2015  . Gastroesophageal reflux disease with esophagitis 06/15/2015  . Bilateral carpal tunnel syndrome 06/15/2015  . Resting tremor 06/15/2015  . Scoliosis of thoracic spine 11/05/2013  . Spinal stenosis, unspecified region other than cervical 12/03/2012  . Spondylosis of unspecified site without mention of myelopathy 12/03/2012  . Parkinson disease ([place])   . Back pain   . Seasonal allergies     Toney Rakes, OT student 03/25/2018, 5:32 PM  [facility] [address] [place], [place], [address] Phone: [phone]   Fax:  [phone]  Name: [name] MRN: [id number] Date of Birth: [birth year]

## 2018-03-30 ENCOUNTER — Ambulatory Visit (HOSPITAL_COMMUNITY): Payer: Medicare Other

## 2018-03-30 ENCOUNTER — Encounter (HOSPITAL_COMMUNITY): Payer: Self-pay

## 2018-03-30 DIAGNOSIS — R29898 Other symptoms and signs involving the musculoskeletal system: Secondary | ICD-10-CM | POA: Diagnosis not present

## 2018-03-30 NOTE — Therapy (Signed)
[place], [place], [address] Phone: [phone]   Fax:  [phone]  Occupational Therapy Treatment  Patient Details  Name: [name] MRN: [id number] Date of Birth: 1933/12/26 Referring Provider (OT): Larey Seat, MD   Encounter Date: 03/30/2018  OT End of Session - 03/30/18 1135    Visit Number  7    Number of Visits  8    Date for OT Re-Evaluation  04/07/18    Authorization Type  1) UHC Medicare with $35 copay    OT Start Time  1128   Pt arrived late   OT Stop Time  1200    OT Time Calculation (min)  32 min    Activity Tolerance  Patient tolerated treatment well    Behavior During Therapy  [facility] for tasks assessed/performed       Past Medical History:  Diagnosis Date  . Ankle swelling    takes torsemide prn  . Anxiety   . Arthritis   . Asthma    allergy induced  . Back pain   . Complication of anesthesia   . Depression   . GERD (gastroesophageal reflux disease)   . Hypercholesterolemia   . Hypothyroid   . Parkinson disease ([place])   . PONV (postoperative nausea and vomiting)   . Seasonal allergies     Past Surgical History:  Procedure Laterality Date  . ABDOMINAL HYSTERECTOMY    . CARPAL TUNNEL RELEASE Right 10/26/2015   Procedure: CARPAL TUNNEL RELEASE;  Surgeon: Daryll Brod, MD;  Location: [place];  Service: Orthopedics;  Laterality: Right;  . CARPOMETACARPEL SUSPENSION PLASTY Right 10/26/2015   Procedure: RIGHT HAND TRAPEZIECTOMY WITH THUMB METACARPEL SUSPENSION PLASTY;  Surgeon: Daryll Brod, MD;  Location: [place];  Service: Orthopedics;  Laterality: Right;  . CATARACT EXTRACTION W/PHACO  12/23/2011   Procedure: CATARACT EXTRACTION PHACO AND INTRAOCULAR LENS PLACEMENT (IOC);  Surgeon: Williams Che, MD;  [facility];  Service: Ophthalmology;  Laterality: Right;  CDE=5.16  . CHOLECYSTECTOMY    . EYE SURGERY     cataract extraction of left  eye-[facility]-2002  . KNEE ARTHROSCOPY     right  . NISSEN FUNDOPLICATION     [facility]  . TRIGGER FINGER RELEASE Right 10/26/2015   Procedure: RELEASE A-1 PULLEY RIGHT RING FINGER;  Surgeon: Daryll Brod, MD;  Location: [place];  Service: Orthopedics;  Laterality: Right;    There were no vitals filed for this visit.  Subjective Assessment - 03/30/18 1128    Subjective   S: I feel like the wrist is getting better. I may be having more arthritis issues now.     Currently in Pain?  Yes    Pain Score  2     Pain Location  Wrist    Pain Orientation  Right    Pain Descriptors / Indicators  Discomfort    Pain Type  Acute pain    Pain Radiating Towards  thumb to back of hand. Sometimes palm.    Pain Onset  More than a month ago    Pain Frequency  Constant    Aggravating Factors   trying to loosen jar lid. Any twisting and gripping tasks. Buttons.    Pain Relieving Factors  rest, naproxen twice a day.    Effect of Pain on Daily Activities  Patient pushes through pain.     Multiple Pain Sites  No  [facility] OT Assessment - 03/30/18 1134      Assessment   Medical Diagnosis  Wrist and hand weakness      Precautions   Precautions  Fall               OT Treatments/Exercises (OP) - 03/30/18 1134      Exercises   Exercises  Wrist;Hand;Theraputty;Elbow      Additional Elbow Exercises   Theraputty - Flatten  red - standing    Theraputty - Roll  Red     Theraputty - Grip  red    Hand Gripper with Large Beads  all beads with gripper set at 42#   horizontal   Hand Gripper with Medium Beads  all beads with gripper set ato 42#   horizontal    Hand Gripper with Small Beads  8 beads with gripper set at 42#. 5 beads with gripper set 37#   horizontal      Hand Exercises   Other Hand Exercises  patient utilized PVC pipe to cut circles into putty focusing on wrist flexion and extension.       Theraputty   Theraputty - Pinch  red- 3 point and lateral                OT Short Term Goals - 03/13/18 1142      OT SHORT TERM GOAL #1   Title  Patient will be educated and independent with a HEP for improved right wrist and hand strength.      Time  4    Period  Weeks    Status  On-going      OT SHORT TERM GOAL #2   Title  Patient will increase lateral and three point pinch strength by at least 4 pounds to more easily complete arts and crafts.     Time  4    Period  Weeks    Status  On-going      OT SHORT TERM GOAL #3   Title  Patient will improve right wrist strength to at least 4+/5 for improved ability to complete gardening tasks.      Time  4    Period  Weeks    Status  On-going      OT SHORT TERM GOAL #4   Title  Patient will increase grip strength to 65 pounds or higher in order to grasp toothbrush more easily during oral care.      Time  4    Period  Weeks    Status  On-going               Plan - 03/30/18 1159    Clinical Impression Statement  A: Patient was able to progress to 42# with handgripper for majority of the beads (large, medium, and small). patient reports that she overall is doing well although she continues to have difficulty with hard gripping tasks such as loosening a jar lid and with buttons. She reports that she is able to do buttons; they just take her longer and she typically will button up her shirt most of the way before putting it on over her head so she won't have to struggle with too many buttons. Discussed completing a reassessment next session and if she is ready for discharge we will do so with a HEP. patient verbalizes understanding.     Plan  P: reassess for possible discharge .    Consulted and Agree with Plan of Care  Patient  Patient will benefit from skilled therapeutic intervention in order to improve the following deficits and impairments:  Decreased activity tolerance, Decreased strength, Impaired UE functional use, Decreased coordination  Visit Diagnosis: Hand  weakness  Wrist weakness  Other symptoms and signs involving the musculoskeletal system    Problem List Patient Active Problem List   Diagnosis Date Noted  . IBS (irritable bowel syndrome) 02/05/2018  . Diarrhea 06/16/2017  . Nausea without vomiting 06/16/2017  . Numbness 08/18/2015  . Polyneuropathy 08/18/2015  . Menopausal syndrome (hot flushes) 06/15/2015  . Tremor, essential 06/15/2015  . Gastroesophageal reflux disease with esophagitis 06/15/2015  . Bilateral carpal tunnel syndrome 06/15/2015  . Resting tremor 06/15/2015  . Scoliosis of thoracic spine 11/05/2013  . Spinal stenosis, unspecified region other than cervical 12/03/2012  . Spondylosis of unspecified site without mention of myelopathy 12/03/2012  . Parkinson disease ([place])   . Back pain   . Seasonal allergies    Ailene Ravel, OTR/L,CBIS  [phone]  03/30/2018, 12:02 PM  [place], [place], [address] Phone: [phone]   Fax:  [phone]  Name: DORY DEMONT MRN: [id number] Date of Birth: 07/09/1933

## 2018-04-01 ENCOUNTER — Encounter (HOSPITAL_COMMUNITY): Payer: Medicare Other | Admitting: Occupational Therapy

## 2018-04-01 ENCOUNTER — Ambulatory Visit (HOSPITAL_COMMUNITY): Payer: Medicare Other

## 2018-04-02 NOTE — Addendum Note (Signed)
Addended by: Guadelupe Sabin A on: 04/02/2018 10:31 AM   Modules accepted: Orders

## 2018-04-07 ENCOUNTER — Ambulatory Visit (HOSPITAL_COMMUNITY): Payer: Medicare Other

## 2018-04-07 ENCOUNTER — Other Ambulatory Visit: Payer: Self-pay

## 2018-04-07 ENCOUNTER — Encounter (HOSPITAL_COMMUNITY): Payer: Self-pay

## 2018-04-07 DIAGNOSIS — R29898 Other symptoms and signs involving the musculoskeletal system: Secondary | ICD-10-CM

## 2018-04-07 DIAGNOSIS — R2681 Unsteadiness on feet: Secondary | ICD-10-CM

## 2018-04-07 DIAGNOSIS — R42 Dizziness and giddiness: Secondary | ICD-10-CM

## 2018-04-07 NOTE — Therapy (Signed)
[place], [place], [address] Phone: [phone]   Fax:  [phone]  Physical Therapy Treatment  Patient Details  Name: [name] MRN: [id number] Date of Birth: 1934-02-16 Referring Provider (PT): Asencion Partridge Dohmeir    Encounter Date: 04/07/2018  PT End of Session - 04/07/18 1453    Visit Number  3    Number of Visits  12    Date for PT Re-Evaluation  04/19/18    Authorization Type  UHC    Authorization Time Period  10/11-->04/18/18    PT Start Time  1435    PT Stop Time  1516    PT Time Calculation (min)  41 min    Activity Tolerance  Patient tolerated treatment well    Behavior During Therapy  [facility] for tasks assessed/performed       Past Medical History:  Diagnosis Date  . Ankle swelling    takes torsemide prn  . Anxiety   . Arthritis   . Asthma    allergy induced  . Back pain   . Complication of anesthesia   . Depression   . GERD (gastroesophageal reflux disease)   . Hypercholesterolemia   . Hypothyroid   . Parkinson disease ([place])   . PONV (postoperative nausea and vomiting)   . Seasonal allergies     Past Surgical History:  Procedure Laterality Date  . ABDOMINAL HYSTERECTOMY    . CARPAL TUNNEL RELEASE Right 10/26/2015   Procedure: CARPAL TUNNEL RELEASE;  Surgeon: Daryll Brod, MD;  Location: [place];  Service: Orthopedics;  Laterality: Right;  . CARPOMETACARPEL SUSPENSION PLASTY Right 10/26/2015   Procedure: RIGHT HAND TRAPEZIECTOMY WITH THUMB METACARPEL SUSPENSION PLASTY;  Surgeon: Daryll Brod, MD;  Location: [place];  Service: Orthopedics;  Laterality: Right;  . CATARACT EXTRACTION W/PHACO  12/23/2011   Procedure: CATARACT EXTRACTION PHACO AND INTRAOCULAR LENS PLACEMENT (IOC);  Surgeon: Williams Che, MD;  [facility];  Service: Ophthalmology;  Laterality: Right;  CDE=5.16  . CHOLECYSTECTOMY    . EYE SURGERY     cataract extraction of left  eye-[facility]-2002  . KNEE ARTHROSCOPY     right  . NISSEN FUNDOPLICATION     [facility]  . TRIGGER FINGER RELEASE Right 10/26/2015   Procedure: RELEASE A-1 PULLEY RIGHT RING FINGER;  Surgeon: Daryll Brod, MD;  Location: [place];  Service: Orthopedics;  Laterality: Right;    There were no vitals filed for this visit.  Subjective Assessment - 04/07/18 1436    Subjective  Patient reports she had a computer class this morning and her wrists became tired. She states OT was good today and that she is not feeling dizzy at start of session. She does have some neck pain currently    Pertinent History  back pain, PT has had surgery in her LT eye and only has peripheral vision in that eye     Currently in Pain?  Yes    Pain Score  4     Pain Location  Neck    Pain Orientation  Posterior;Lower    Pain Descriptors / Indicators  Aching    Pain Type  Chronic pain    Pain Onset  More than a month ago    Pain Frequency  Intermittent        [facility] Adult PT Treatment/Exercise - 04/07/18 0001      Exercises   Exercises  Neck      Neck Exercises:  Seated   Other Seated Exercise  3D cervical excursion, 10 reps each direction      Vestibular Treatment/Exercise - 04/07/18 0001      Vestibular Treatment/Exercise   Gaze Exercises  X1 Viewing Horizontal;X1 Viewing Vertical      X1 Viewing Horizontal   Foot Position  seated    Time  0130    Reps  2      X1 Viewing Vertical   Foot Position  seated    Time  0130    Reps  2      X2 Viewing Horizontal   Foot Position  seated    Time  0130    Reps  2      X2 Viewing Vertical   Foot Position  seated    Reps  2    Comments  1:30 minute       Balance Exercises - 04/07/18 1500      Balance Exercises: Standing   Standing Eyes Opened  Narrow base of support (BOS);Foam/compliant surface;4 reps;30 secs;Cognitive challenge   x1 viewing: 2 reps horizontal, 2 reps vertical   SLS  Eyes open;Solid surface;3 reps;10 secs   3x bil LE (6 reps  total)   Partial Tandem Stance  Eyes open;2 reps   alt foot alt alignment, 10 reps bil UE flexion with 1# dowel       PT Education - 04/07/18 1637    Education Details  Reviewed exercises throughout session, patient required verbal cues to sequence x1 and x2 exercises.    Person(s) Educated  Patient    Methods  Explanation;Verbal cues    Comprehension  Verbalized understanding;Returned demonstration       PT Short Term Goals - 03/20/18 1303      PT SHORT TERM GOAL #1   Title  PT to have loss of balance incidents 2x a day or less to reduce risk of falling     Time  2    Period  Weeks    Status  New    Target Date  04/03/18      PT SHORT TERM GOAL #2   Title  PT to be able to single leg stance on both legs for at least 8 seconds to reduce risk of falling     Time  2    Period  Weeks    Status  New      PT SHORT TERM GOAL #3   Title  PT to be performing basic habituation exercises to reduce episodes of dizziness     Time  2    Period  Weeks    Status  New        PT Long Term Goals - 03/20/18 1305      PT LONG TERM GOAL #1   Title  PT to have episodes of dizziness 3 or less times a week.     Time  4    Period  Weeks    Status  New    Target Date  04/17/18      PT LONG TERM GOAL #2   Title  PT to be able to single leg stance B for at least 12 seconds to decrease risk of falling     Time  2    Period  Weeks    Status  New      PT LONG TERM GOAL #3   Title  Pt to be I in an advanced balance exercise program to reduce risk of falling  Time  4    Period  Weeks    Status  New         Plan - 04/07/18 1453    Clinical Impression Statement  Patient continued with vestibular treatment today with gaze stability exercises in sitting and standing. She performed x1 seated exercises and x2. Patient had difficulty with sequencing x2 exercises requiring verbal cues to achieve pattern. She had a tendency throughout to perform half of exercise with x1 vertical/horizontal,  either moving form midline to extension repeatedly or midline to Rt rotation repeatedly rather than continuing through entire range. When patient slowed down and verbal cues provided she was able to move through full range during exercises. During all movements she demonstrated a lag with gaze stabilization. Standing balance was progressed as well with tandem balance and head turns, and tandem with UE movements. Patient has tendency for posterior trunk lean and excessive thoracic flexion requiring cues to maintain upright posture. She denied increase in dizziness at EOS and will continue to benefit from skilled PT interventions to address vestibular symptoms and balance impairments.    Rehab Potential  Good    PT Frequency  3x / week    PT Duration  4 weeks    PT Treatment/Interventions  ADLs/Self Care Home Management;Balance training;Neuromuscular re-education;Patient/family education;Therapeutic exercise;Therapeutic activities;Manual techniques    PT Next Visit Plan  Review nose to knee exercise. Progress eye and head activity to standing and dynamic balance. Progress to walking with eye/head movements when able. Perform manual PRN for cervical with goal of improving cervical rotation.    PT Home Exercise Plan  sitting eye exercises, cervical ROM, scapular and cervical retraction,     Consulted and Agree with Plan of Care  Patient       Patient will benefit from skilled therapeutic intervention in order to improve the following deficits and impairments:  Decreased balance, Decreased range of motion, Decreased strength, Dizziness, Postural dysfunction  Visit Diagnosis: Other symptoms and signs involving the musculoskeletal system  Unsteadiness on feet  Dizziness and giddiness     Problem List Patient Active Problem List   Diagnosis Date Noted  . IBS (irritable bowel syndrome) 02/05/2018  . Diarrhea 06/16/2017  . Nausea without vomiting 06/16/2017  . Numbness 08/18/2015  .  Polyneuropathy 08/18/2015  . Menopausal syndrome (hot flushes) 06/15/2015  . Tremor, essential 06/15/2015  . Gastroesophageal reflux disease with esophagitis 06/15/2015  . Bilateral carpal tunnel syndrome 06/15/2015  . Resting tremor 06/15/2015  . Scoliosis of thoracic spine 11/05/2013  . Spinal stenosis, unspecified region other than cervical 12/03/2012  . Spondylosis of unspecified site without mention of myelopathy 12/03/2012  . Parkinson disease ([place])   . Back pain   . Seasonal allergies     Kipp Brood, PT, DPT Physical Therapist with [facility]  04/07/2018 5:39 PM    [place], [place], [address] Phone: [phone]   Fax:  [phone]  Name: [name] MRN: [id number] Date of Birth: 17-Mar-1934

## 2018-04-07 NOTE — Therapy (Signed)
[place], [place], [address] Phone: [phone]   Fax:  [phone]  Occupational Therapy Treatment Reassessment and discharge Patient Details  Name: MARCELLA CHARLSON MRN: [id number] Date of Birth: May 14, 1934 Referring Provider (OT): Larey Seat, MD   Encounter Date: 04/07/2018  OT End of Session - 04/07/18 1500    Visit Number  8    Number of Visits  8    Date for OT Re-Evaluation  04/07/18    Authorization Type  1) UHC Medicare with $35 copay    OT Start Time  4481   Pt arrived late. reassessment and discharge   OT Stop Time  1430    OT Time Calculation (min)  27 min    Activity Tolerance  Patient tolerated treatment well    Behavior During Therapy  [facility] for tasks assessed/performed       Past Medical History:  Diagnosis Date  . Ankle swelling    takes torsemide prn  . Anxiety   . Arthritis   . Asthma    allergy induced  . Back pain   . Complication of anesthesia   . Depression   . GERD (gastroesophageal reflux disease)   . Hypercholesterolemia   . Hypothyroid   . Parkinson disease ([place])   . PONV (postoperative nausea and vomiting)   . Seasonal allergies     Past Surgical History:  Procedure Laterality Date  . ABDOMINAL HYSTERECTOMY    . CARPAL TUNNEL RELEASE Right 10/26/2015   Procedure: CARPAL TUNNEL RELEASE;  Surgeon: Daryll Brod, MD;  Location: [place];  Service: Orthopedics;  Laterality: Right;  . CARPOMETACARPEL SUSPENSION PLASTY Right 10/26/2015   Procedure: RIGHT HAND TRAPEZIECTOMY WITH THUMB METACARPEL SUSPENSION PLASTY;  Surgeon: Daryll Brod, MD;  Location: [place];  Service: Orthopedics;  Laterality: Right;  . CATARACT EXTRACTION W/PHACO  12/23/2011   Procedure: CATARACT EXTRACTION PHACO AND INTRAOCULAR LENS PLACEMENT (IOC);  Surgeon: Williams Che, MD;  [facility];  Service: Ophthalmology;  Laterality: Right;  CDE=5.16  . CHOLECYSTECTOMY     . EYE SURGERY     cataract extraction of left eye-[facility]-2002  . KNEE ARTHROSCOPY     right  . NISSEN FUNDOPLICATION     [facility]  . TRIGGER FINGER RELEASE Right 10/26/2015   Procedure: RELEASE A-1 PULLEY RIGHT RING FINGER;  Surgeon: Daryll Brod, MD;  Location: [place];  Service: Orthopedics;  Laterality: Right;    There were no vitals filed for this visit.  Subjective Assessment - 04/07/18 1500    Subjective   S: I was doing a keyboarding class before I came so my hands got a workout.     Currently in Pain?  No/denies         [facility] OT Assessment - 04/07/18 1403      Assessment   Medical Diagnosis  Wrist and hand weakness      Precautions   Precautions  Fall      Strength   Strength Assessment Site  Hand;Wrist    Right/Left Wrist  Right    Right Wrist Flexion  5/5   previous: 3+/5   Right Wrist Extension  5/5   previous: 3+/5   Right Wrist Radial Deviation  5/5   previous: 3+/5   Right Wrist Ulnar Deviation  5/5   previous: 3+/5   Right Hand Grip (lbs)  60   previous: 55   Right Hand Lateral Pinch  9 lbs  Previous: 6   Right Hand 3 Point Pinch  8 lbs   previous: 4                      OT Education - 04/07/18 1422    Education Details  Pt provided with 4 additional grip and pinch exercises for red theraputty. patient provided with green putty for upgrade when needed for HEP.     Person(s) Educated  Patient    Methods  Explanation;Handout;Demonstration    Comprehension  Verbalized understanding;Returned demonstration       OT Short Term Goals - 04/07/18 1410      OT SHORT TERM GOAL #1   Title  Patient will be educated and independent with a HEP for improved right wrist and hand strength.      Time  4    Period  Weeks    Status  Achieved      OT SHORT TERM GOAL #2   Title  Patient will increase lateral and three point pinch strength by at least 4 pounds to more easily complete arts and crafts.     Time  4    Period  Weeks     Status  Partially Met      OT SHORT TERM GOAL #3   Title  Patient will improve right wrist strength to at least 4+/5 for improved ability to complete gardening tasks.      Time  4    Period  Weeks    Status  Achieved      OT SHORT TERM GOAL #4   Title  Patient will increase grip strength to 65 pounds or higher in order to grasp toothbrush more easily during oral care.      Time  4    Period  Weeks    Status  Partially Met               Plan - 04/07/18 1501    Clinical Impression Statement  A: Reassessment completed this date. patient has been all therapy goals except her grip strength she increased by 5 lbs and not 10lbs. She missed meeting her 3 pinch strength goal by 1lb as well. Overall, patient reports that her wrist feels a lot better. She feels her arthritis is her main deficit and feels like her pain is stemming from that when it occurs. Reviewed goals and updated HEP as needed. Discussed discharge and patient is in agreement.     Plan  P: D/C from OT services with HEP.     Consulted and Agree with Plan of Care  Patient       Patient will benefit from skilled therapeutic intervention in order to improve the following deficits and impairments:  Decreased activity tolerance, Decreased strength, Impaired UE functional use, Decreased coordination  Visit Diagnosis: Other symptoms and signs involving the musculoskeletal system    Problem List Patient Active Problem List   Diagnosis Date Noted  . IBS (irritable bowel syndrome) 02/05/2018  . Diarrhea 06/16/2017  . Nausea without vomiting 06/16/2017  . Numbness 08/18/2015  . Polyneuropathy 08/18/2015  . Menopausal syndrome (hot flushes) 06/15/2015  . Tremor, essential 06/15/2015  . Gastroesophageal reflux disease with esophagitis 06/15/2015  . Bilateral carpal tunnel syndrome 06/15/2015  . Resting tremor 06/15/2015  . Scoliosis of thoracic spine 11/05/2013  . Spinal stenosis, unspecified region other than cervical  12/03/2012  . Spondylosis of unspecified site without mention of myelopathy 12/03/2012  . Parkinson disease ([place])   .  Back pain   . Seasonal allergies     OCCUPATIONAL THERAPY DISCHARGE SUMMARY  Visits from Start of Care: 8  Current functional level related to goals / functional outcomes: See above   Remaining deficits: See above   Education / Equipment: See above Plan: Patient agrees to discharge.  Patient goals were met. Patient is being discharged due to meeting the stated rehab goals.  ?????         Ailene Ravel, OTR/L,CBIS  [phone]  04/07/2018, 3:10 PM  [place], [place], [address] Phone: [phone]   Fax:  [phone]  Name: LABRINA LINES MRN: [id number] Date of Birth: [birth year]

## 2018-04-07 NOTE — Patient Instructions (Signed)
PUTTY KEY GRIP  Hold the putty at the top of your hand. Squeeze the putty between your thumb and the side of your 2nd finger as shown.    PUTTY EXTENSION TABLE SPREAD  Roll up some putty into a ball and then press and flatten it on a table. Next, spread the putty out with your finger tips as shown.      PUTTY GRIP  Place putty in your hand and squeeze it firmly and slowly. Reshape it and repeat.     PUTTY THUMB FLEXION  Press your the tip of your thumb into the putty as shown.

## 2018-04-29 ENCOUNTER — Other Ambulatory Visit: Payer: Self-pay | Admitting: Gastroenterology

## 2018-04-29 DIAGNOSIS — K2100 Gastro-esophageal reflux disease with esophagitis, without bleeding: Secondary | ICD-10-CM

## 2018-04-29 DIAGNOSIS — R197 Diarrhea, unspecified: Secondary | ICD-10-CM

## 2018-05-21 ENCOUNTER — Encounter (HOSPITAL_COMMUNITY): Payer: Self-pay

## 2018-05-21 ENCOUNTER — Ambulatory Visit: Payer: Medicare Other | Admitting: Neurology

## 2018-05-21 NOTE — Therapy (Signed)
[name], [place], [address] Phone: [phone]   Fax:  [phone]  Patient Details  Name: [name] MRN: [id number] Date of Birth: 1934-05-19 Referring Provider:  No ref. provider found  Encounter Date: 05/21/2018  PHYSICAL THERAPY DISCHARGE SUMMARY  Visits from Start of Care: 3  Current functional level related to goals / functional outcomes: [name] is being discharged from this episode of therapy as she has not returned since her last visit on 04/07/18. She had been participating in exercises for vertigo symptoms and balance training and had been provided an HEP for cervical ROM and compensatory eye movement training. She will need a new referral to return to therapy.   Remaining deficits: See last note for details. Patient was not in therapy for long enough duration to undergo a formal re-assessment towards goals.   Education / Equipment: See prior treatment notes for details.  Plan: Patient agrees to discharge.  Patient goals were not met. Patient is being discharged due to meeting the stated rehab goals.  ?????     Kipp Brood, PT, DPT Physical Therapist with [facility]  05/21/2018 4:20 PM    [place], [place], [address] Phone: [phone]   Fax:  [phone]

## 2018-05-25 ENCOUNTER — Encounter: Payer: Self-pay | Admitting: Neurology

## 2018-05-25 ENCOUNTER — Encounter

## 2018-05-25 ENCOUNTER — Ambulatory Visit: Payer: Medicare Other | Admitting: Neurology

## 2018-05-25 VITALS — BP 129/77 | HR 82 | Ht 62.0 in | Wt 164.0 lb

## 2018-05-25 DIAGNOSIS — G20 Parkinson's disease: Secondary | ICD-10-CM

## 2018-05-25 MED ORDER — ALPRAZOLAM 0.5 MG PO TABS: 0.2500 mg | ORAL_TABLET | Freq: Every evening | ORAL | 1 refills | Status: DC | PRN

## 2018-05-25 MED ORDER — CARBIDOPA-LEVODOPA 25-100 MG PO TABS: 1.0000 | ORAL_TABLET | Freq: Four times a day (QID) | ORAL | 2 refills | Status: DC

## 2018-05-25 NOTE — Progress Notes (Signed)
PATIENT: [name] DOB: 12-07-1933  REASON FOR VISIT: follow up-tremor, insomnia HISTORY FROM: patient  HISTORY OF PRESENT ILLNESS:  05-25-2018, Rv for [name], an 84 year old widowed, [name] patient with a history of declining gait capacity and stability, she is treated with Sinemet 3 times a day and she has not needed a cane to walk since being on the medication.  She had some spells that appear to be vertiginous and she has undergone vestibular rehab with some success.  The spells are less frequent but they could still occur.  To treat what we thought was essential tremors she has Xanax available and Xanax also helps to treat acute vertigo so she is allowed to take the medication but has to be aware that she cannot drive or operate machinery after taking it.  She is well-groomed pleasant very alert blood pressure and heart rate today are regular normal range.  I would like for her to stay on the current medication regimen. RV in 6 month.     10-22-2016, [name] is an 84 year old widow  with a history of resting tremor and insomnia. She has been diagnosed 11 years ago.  She has worried about her ability to walk and implemented a strong exercise regimen, stationary bicycle 3 times a week. She feels improvement . She likes to walk, does not need a cane any more !   She is currently on Sinemet 3 times a day, sometimes forgets the last dose. She reports that this is beneficial for her tremor and she noted a difference when not taking it. 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 She states that more recently she's noticed that the hands go numb - it only last for several minutes and then it improves. She has had surgery on the right hand for carpal tunnel syndrome in the hands. She needed a tendon replacement (?) and a ganglion cyst removal, but her right hand I has lost strength. 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  She states that occasionally she has to use Xanax for sleep. Reports that she does not use it every night.   HISTORY: July 2016 ( MM)  is a 84 year old female with a history of resting tremor referred by Dr. Karie Kirks. She returns today for follow-up. The patient continues to take Sinemet 3 times a day. She reports that her resting tremor has improved. Patient denies any changes with her walking or balance. Denies any falls since the last visit. Denies any trouble swallowing. She does state that she has a goiter. Patient does have trouble falling asleep. She has been prescribed Xanax in the past to help with this. She states that she will take this occasionally but she tries not to take it consistently so that she doesn't become dependent on the medication. The patient states that she recently saw Dr. Karie Kirks and he decreased her Zoloft to half a tablet a day. She states that so far she is tolerating this well. She states that if she starts to feel emotionally or "Antsy" she will take the other half. Overall she feels that she is doing well. Returns today for an evaluation.  06-15-15; Patient weaned completely off Hydrocodone and takes ibuprofen. She has arthritis in both hands. She has developed significant carpal tunnel in both hands with a shrinkage of the thenar eminence.The patient is right-handed and is significantly  enough bothered by the carpal tunnel that I think she should be surgically evaluated.She feels weaker than last year. In addition her resting tremor appears to be well controlled on 3 times a day Sinemet. She does not have dysarthria or dysphonia, she does have a history of hiatal hernia and reflux. Her primary care physician, Dr. Karie Kirks, has tried to reduce her off the Prilosec and also reduced her antidepressant Zoloft to 50% of the previous dose. Her daughter feels that she gets "snappy " in response and more  "abrupt" which is usually not her personality. It may be that Zoloft is no longer working for her and that it would be time to look at anewer antidepressants. I suggested Pristiq at 50 mg daily. I am not sure her medicare plan will allow it. I like for her to take prilosec every other day.   REVIEW OF SYSTEMS: Out of a complete 14 system review of symptoms, the patient complains only of the following symptoms, and all other reviewed systems are negative. Epworth Sleepiness score 6/ 24  points,  34/63  points on Fatigue Severity, geriatric depression score  4/15 p. Insomnia has improved on xanax. Vertigo has improved with PT.  ALLERGIES: Allergies  Allergen Reactions  . Cephalosporins Rash  . Latex Rash    Breaks out then gets infected  . Lactose Intolerance (Gi) Nausea And Vomiting  . Lactulose Nausea And Vomiting  . Cephalexin Rash  . Meperidine And Related Nausea And Vomiting    Needed Phenergan to combat nausea.  . Omeprazole Rash    HOME MEDICATIONS: Outpatient Medications Prior to Visit  Medication Sig Dispense Refill  . albuterol (PROVENTIL) (2.5 MG/3ML) 0.083% nebulizer solution Take 2.5 mg by nebulization every 6 (six) hours as needed for wheezing or shortness of breath.    . ALPRAZolam (XANAX) 0.5 MG tablet 0.25-0.5 mg at bedtime as needed.     . Biotin 10 MG CAPS Take 1 capsule by mouth daily.     Marland Kitchen bismuth subsalicylate (PEPTO BISMOL) 262 MG/15ML suspension Take 30 mLs by mouth as needed.    . carbidopa-levodopa (SINEMET IR) 25-100 MG tablet Take 1 tablet by mouth 4 (four) times daily. 360 tablet 1  . cetirizine-pseudoephedrine (ZYRTEC-D) 5-120 MG per tablet Take 1 tablet by mouth daily. For sinus     . Cholecalciferol (D3-1000 PO) Take by mouth daily.    Marland Kitchen dicyclomine (BENTYL) 10 MG capsule TAKE 1 CAPSULE BY MOUTH UP TO TWICE DAILY AS NEEDED FOR ABDOMINAL CRAMPS/DIARRHEA 180 capsule 2  . escitalopram (LEXAPRO) 5 MG tablet Take 10 mg by mouth daily.    . Lactobacillus  (FLORAJEN ACIDOPHILUS PO) Take 1 tablet by mouth daily.    Marland Kitchen levothyroxine (SYNTHROID, LEVOTHROID) 25 MCG tablet Take 25 mcg by mouth daily.    Marland Kitchen loperamide (IMODIUM) 2 MG capsule Take by mouth as needed for diarrhea or loose stools.    . meclizine (ANTIVERT) 25 MG tablet Take 12.5-25 mg by mouth 4 (four) times daily as needed for dizziness.    . mometasone (NASONEX) 50 MCG/ACT nasal spray Place 2 sprays into the nose 2 (two) times daily as needed. For allergies    . montelukast (SINGULAIR) 10 MG tablet Take 10 mg by mouth at bedtime.      . Multiple Vitamins-Minerals (CAL-DAY 1000 PO) Take by mouth daily.    . naproxen (NAPROSYN) 500 MG tablet Take 500 mg by mouth 2 (two) times daily with a meal.    . OVER THE COUNTER  MEDICATION IB Gard as needed    . potassium chloride (MICRO-K) 10 MEQ CR capsule Take 20 mEq by mouth daily.     Marland Kitchen QVAR REDIHALER 80 MCG/ACT AERB INHALATION TAKE 2 PUFFS DAILY TO PREVENT BREATHING PROBLEMS.  7  . ranitidine (ZANTAC) 150 MG tablet Take 1 tablet (150 mg total) by mouth 2 (two) times daily. 180 tablet 3  . torsemide (DEMADEX) 10 MG tablet Take 10 mg by mouth daily. 1/2 to 1 tab daily    . albuterol (PROVENTIL HFA;VENTOLIN HFA) 108 (90 BASE) MCG/ACT inhaler Inhale 2 puffs into the lungs every 6 (six) hours as needed. For shortness of breath    . cholestyramine (QUESTRAN) 4 GM/DOSE powder Take by mouth 2 (two) times daily with a meal.    . ondansetron (ZOFRAN) 4 MG tablet Take 1 tablet (4 mg total) by mouth every 8 (eight) hours as needed for nausea or vomiting. 30 tablet 1   No facility-administered medications prior to visit.     PAST MEDICAL HISTORY: Past Medical History:  Diagnosis Date  . Ankle swelling    takes torsemide prn  . Anxiety   . Arthritis   . Asthma    allergy induced  . Back pain   . Complication of anesthesia   . Depression   . GERD (gastroesophageal reflux disease)   . Hypercholesterolemia   . Hypothyroid   . Parkinson disease ([place])     . PONV (postoperative nausea and vomiting)   . Seasonal allergies     PAST SURGICAL HISTORY: Past Surgical History:  Procedure Laterality Date  . ABDOMINAL HYSTERECTOMY    . CARPAL TUNNEL RELEASE Right 10/26/2015   Procedure: CARPAL TUNNEL RELEASE;  Surgeon: Daryll Brod, MD;  Location: [facility];  Service: Orthopedics;  Laterality: Right;  . CARPOMETACARPEL SUSPENSION PLASTY Right 10/26/2015   Procedure: RIGHT HAND TRAPEZIECTOMY WITH THUMB METACARPEL SUSPENSION PLASTY;  Surgeon: Daryll Brod, MD;  Location: [place] [facility];  Service: Orthopedics;  Laterality: Right;  . CATARACT EXTRACTION W/PHACO  12/23/2011   Procedure: CATARACT EXTRACTION PHACO AND INTRAOCULAR LENS PLACEMENT (IOC);  Surgeon: Williams Che, MD;  Location: [facility];  Service: Ophthalmology;  Laterality: Right;  CDE=5.16  . CHOLECYSTECTOMY    . EYE SURGERY     cataract extraction of left eye-[facility]-2002  . KNEE ARTHROSCOPY     right  . NISSEN FUNDOPLICATION     [facility]  . TRIGGER FINGER RELEASE Right 10/26/2015   Procedure: RELEASE A-1 PULLEY RIGHT RING FINGER;  Surgeon: Daryll Brod, MD;  Location: [facility];  Service: Orthopedics;  Laterality: Right;    FAMILY HISTORY: Family History  Problem Relation Age of Onset  . Stroke Mother   . COPD Father   . Stroke Father   . Colon cancer Neg Hx     SOCIAL HISTORY:  Widowed.   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   Social History   Socioeconomic History  . Marital status: Married    Spouse name: Not on file  . Number of children: 2  . Years of education: Not on file  . Highest education level: Not on file  Occupational History    Comment: retired  Scientific laboratory technician  . Financial resource strain: Not on file  . Food insecurity:    Worry: Not on file    Inability: Not on file  . Transportation needs:    Medical: Not on file    Non-medical: Not on file  Tobacco  Use  . Smoking status: Never Smoker  . Smokeless tobacco: Never Used   Substance and Sexual Activity  . Alcohol use: Yes    Alcohol/week: 1.0 standard drinks    Types: 1 Glasses of wine per week    Comment: occ  . Drug use: No  . Sexual activity: Not on file  Lifestyle  . Physical activity:    Days per week: Not on file    Minutes per session: Not on file  . Stress: Not on file  Relationships  . Social connections:    Talks on phone: Not on file    Gets together: Not on file    Attends religious service: Not on file    Active member of club or organization: Not on file    Attends meetings of clubs or organizations: Not on file    Relationship status: Not on file  . Intimate partner violence:    Fear of current or ex partner: Not on file    Emotionally abused: Not on file    Physically abused: Not on file    Forced sexual activity: Not on file  Other Topics Concern  . Not on file  Social History Narrative   Patient lives at home alone and she is widowed.   Retired.    Education one year business course.   Right handed.   Caffeine two cup of coffee daily and one sweet tea.      PHYSICAL EXAM  Vitals:   05/25/18 1058  BP: 129/77  Pulse: 82  Weight: 164 lb (74.4 kg)  Height: 5' 2"  (1.575 m)   Body mass index is 30 kg/m.  Generalized: Well developed, well groomed.    Neurological examination  Mentation: Alert oriented to time, place, history taking. Follows all commands , reports word finding difficulties. She has less vertigo, no need for a cane. No falls in the last 6 month.  Cranial nerve :  Loss of sense of smell- severe, onset over the years 2004-2007. .  Taste less affected. Pupils are equal round reactive to light.  Status post cataract surgery-No ptosis, left eye is not fully closed when she sleeps- Extraocular movements were full, visual field were full on confrontational test.  Facial sensation and strength were normal. Uvula and tongue move in midline, no deviation and no tremor. . Head turning and shoulder shrug  were  normal and symmetric. Motor: Symmetrically elevated motor tone is noted throughout. Not typical  cogwheeling.  Right hand strength is reduced, pinch and grip .  Sensory:   feet are bilaterally numb to vibration.  Coordination: finger-nose bilaterally intact.   Gait and station: Gait is stooped .  Tandem gait is not longer unsteady. Ataxia is still present.  Uses heel toe commands.   Reflexes: Deep tendon reflexes are symmetric and brisk bilaterally.   DIAGNOSTIC DATA (LABS, IMAGING, TESTING) - I reviewed patient records, labs, notes, testing and imaging myself where available.   ASSESSMENT AND PLAN 84 y.o. year old female  has a past medical history of Ankle swelling, Anxiety, Arthritis, Asthma, Back pain, Complication of anesthesia, Depression, GERD (gastroesophageal reflux disease), Hypercholesterolemia, Hypothyroid, Parkinson disease ([place]), PONV (postoperative nausea and vomiting), and Seasonal allergies. here with:  BP Vertigo has improved with PT.  No falls,  May take Xanax prn - stay off meclizine.   1. Tremor , dopamine responsive -thought to reflect  PD. Inhaler makes tremor stronger. Sinemet is reducing the tremor.  She presents today without tremor.  2.  Insomnia improved. She has still a lot of vivi dreams-REM BD possibly.   5. MCI ? Will use MOCA/MMSE over the next few visit. RV in 3-4 month. Was not prepared today- will need to do this in 6 month. CD  Continue on Sinemet 25-100 milligrams , but increase to 4 times a day before a meal. She has had tremors for about 12 years.  Patient advised that the numbness in her hands could be a result of carpal tunnel and polyneuropathy.  She will continue using Xanax as needed. Advised that she should not use this nightly.  Referral to vestibular rehab and PT.  She will follow-up in 6 months with either me, Dr. Brett Fairy, or NP for [facility]   Larey Seat, MD  05/25/2018, 11:12 AM [facility] [address] [place], [place] [address] [phone]

## 2018-05-25 NOTE — Patient Instructions (Signed)
Parkinson Disease Parkinson disease is a long-term (chronic) condition. It gets worse over time (is progressive). Parkinson disease limits your ability:  To control how your body moves.  To move your body normally.  This condition affects each person differently. The condition can range from mild to very bad. This condition tends to progress slowly over many years. Follow these instructions at home:  Take over-the-counter and prescription medicines only as told by your doctor.  Put grab bars and railings in your home. These help to prevent falls.  Follow instructions from your doctor about what you can or cannot eat or drink.  Go back to your normal activities as told by your doctor. Ask your doctor what activities are safe for you.  Exercise as told by your doctor or physical therapist.  Keep all follow-up visits as told by your doctor. This is important. These include any visits with a speech or occupational therapist.  Think about joining a support group for people who have Parkinson disease. Contact a doctor if:  Medicines do not help your symptoms.  You feel off-balance.  You fall at home.  You need more help at home.  You have: ? Trouble swallowing. ? A very hard time pooping (constipation). ? A lot of side effects from your medicines.  You see or hear things that are not real (hallucinate).  You feel: ? Confused. ? Anxious. ? Sad (depressed). Get help right away if:  You were hurt in a fall.  You cannot swallow without choking.  You have chest pain.  You have trouble breathing.  You do not feel safe at home. This information is not intended to replace advice given to you by your health care provider. Make sure you discuss any questions you have with your health care provider. Document Released: 08/19/2011 Document Revised: 11/02/2015 Document Reviewed: 03/17/2015 Elsevier Interactive Patient Education  [employer].

## 2018-08-04 ENCOUNTER — Ambulatory Visit: Payer: Medicare Other | Admitting: Nurse Practitioner

## 2018-08-14 ENCOUNTER — Other Ambulatory Visit (HOSPITAL_COMMUNITY)
Admission: RE | Admit: 2018-08-14 | Discharge: 2018-08-14 | Disposition: A | Discharge status: Home / Self Care | Payer: Medicare Other | Source: Ambulatory Visit | Attending: Nurse Practitioner | Admitting: Nurse Practitioner

## 2018-08-14 ENCOUNTER — Ambulatory Visit (HOSPITAL_COMMUNITY)
Admission: RE | Admit: 2018-08-14 | Discharge: 2018-08-14 | Disposition: A | Discharge status: Home / Self Care | Payer: Medicare Other | Source: Ambulatory Visit | Attending: Nurse Practitioner | Admitting: Nurse Practitioner

## 2018-08-14 ENCOUNTER — Other Ambulatory Visit (HOSPITAL_COMMUNITY): Payer: Self-pay | Admitting: Nurse Practitioner

## 2018-08-14 ENCOUNTER — Other Ambulatory Visit (HOSPITAL_COMMUNITY): Payer: Self-pay | Admitting: Otolaryngology

## 2018-08-14 DIAGNOSIS — R05 Cough: Secondary | ICD-10-CM | POA: Insufficient documentation

## 2018-08-14 DIAGNOSIS — R509 Fever, unspecified: Secondary | ICD-10-CM | POA: Diagnosis present

## 2018-08-14 DIAGNOSIS — R059 Cough, unspecified: Secondary | ICD-10-CM

## 2018-08-14 LAB — CBC WITH DIFFERENTIAL/PLATELET
Abs Immature Granulocytes: 0.02 10*3/uL (ref 0.00–0.07)
BASOS PCT: 1 %
Basophils Absolute: 0 10*3/uL (ref 0.0–0.1)
Eosinophils Absolute: 0 10*3/uL (ref 0.0–0.5)
Eosinophils Relative: 1 %
HCT: 40.4 % (ref 36.0–46.0)
Hemoglobin: 12.7 g/dL (ref 12.0–15.0)
Immature Granulocytes: 1 %
Lymphocytes Relative: 21 %
Lymphs Abs: 0.9 10*3/uL (ref 0.7–4.0)
MCH: 30.6 pg (ref 26.0–34.0)
MCHC: 31.4 g/dL (ref 30.0–36.0)
MCV: 97.3 fL (ref 80.0–100.0)
Monocytes Absolute: 1.5 10*3/uL — ABNORMAL HIGH (ref 0.1–1.0)
Monocytes Relative: 36 %
Neutro Abs: 1.8 10*3/uL (ref 1.7–7.7)
Neutrophils Relative %: 40 %
Platelets: 200 10*3/uL (ref 150–400)
RBC: 4.15 MIL/uL (ref 3.87–5.11)
RDW: 14.2 % (ref 11.5–15.5)
WBC: 4.3 10*3/uL (ref 4.0–10.5)
nRBC: 0 % (ref 0.0–0.2)

## 2018-10-07 ENCOUNTER — Other Ambulatory Visit: Payer: Self-pay | Admitting: Nurse Practitioner

## 2018-10-07 DIAGNOSIS — K580 Irritable bowel syndrome with diarrhea: Secondary | ICD-10-CM

## 2018-10-07 DIAGNOSIS — K2100 Gastro-esophageal reflux disease with esophagitis, without bleeding: Secondary | ICD-10-CM

## 2018-10-07 DIAGNOSIS — R197 Diarrhea, unspecified: Secondary | ICD-10-CM

## 2018-10-07 NOTE — Telephone Encounter (Signed)
Pt notified of that RX had been recalled. She will try Pepcid or Tagament and call back if needed.

## 2018-10-07 NOTE — Telephone Encounter (Signed)
Zantac has been recalled due to FDA. If she needs something as needed, may use famotidine over the counter or Tagamet.

## 2018-10-08 ENCOUNTER — Encounter: Payer: Self-pay | Admitting: Nurse Practitioner

## 2018-10-08 ENCOUNTER — Ambulatory Visit (INDEPENDENT_AMBULATORY_CARE_PROVIDER_SITE_OTHER): Payer: Medicare Other | Admitting: Nurse Practitioner

## 2018-10-08 ENCOUNTER — Other Ambulatory Visit: Payer: Self-pay

## 2018-10-08 ENCOUNTER — Encounter: Payer: Self-pay | Admitting: Internal Medicine

## 2018-10-08 DIAGNOSIS — K2100 Gastro-esophageal reflux disease with esophagitis, without bleeding: Secondary | ICD-10-CM

## 2018-10-08 DIAGNOSIS — K580 Irritable bowel syndrome with diarrhea: Secondary | ICD-10-CM | POA: Diagnosis not present

## 2018-10-08 NOTE — Patient Instructions (Signed)
Your health issues we discussed today were:   GERD (heartburn/reflux): 1. We will not start a new heartburn medication unless she develop new symptoms 2. Call us and let us know if your heartburn comes back  Diarrhea due to irritable bowel syndrome: 1. As we discussed, stress can play a large factor in irritable bowel syndrome and diarrhea 2. Continue your current medications (Bentyl/dicyclomine as needed, Imodium as needed) 3. Let us know if you have any worsening or severe symptoms  Overall I recommend:  1. Call us if you have any questions or concerns 2. Continue your other current medications 3. Follow-up in 6 months.   Because of recent events of COVID-19 ("Coronavirus"), follow CDC recommendations:  1. Wash your hand frequently 2. Avoid touching your face 3. Stay away from people who are sick 4. If you have symptoms such as fever, cough, shortness of breath then call your healthcare provider for further guidance 5. If you are sick, STAY AT HOME unless otherwise directed by your healthcare provider. 6. Follow directions from state and national officials regarding staying safe   At [facility] we value your feedback. You may receive a survey about your visit today. Please share your experience as we strive to create trusting relationships with our patients to provide genuine, compassionate, quality care.  We appreciate your understanding and patience as we review any laboratory studies, imaging, and other diagnostic tests that are ordered as we care for you. Our office policy is 5 business days for review of these results, and any emergent or urgent results are addressed in a timely manner for your best interest. If you do not hear from our office in 1 week, please contact us.   We also encourage the use of MyChart, which contains your medical information for your review as well. If you are not enrolled in this feature, an access code is on this after visit summary  for your convenience. Thank you for allowing [place] to be involved in your care.  It was great to see you today!  I hope you have a great day!!

## 2018-10-08 NOTE — Progress Notes (Signed)
Referring Provider: Lemmie Evens, MD Primary Care Physician:  Lemmie Evens, MD Primary GI:  Dr. Gala Romney  NOTE: Service was provided via telemedicine and was requested by the patient due to COVID-19 pandemic.  Method of visit: Telephone  Patient Location: Home  Provider Location: Office  Reason for Phone Visit: Follow-up  The patient was consented to phone follow-up via telephone encounter including billing of the encounter (yes/no): Yes  Persons present on the phone encounter, with roles: None  Total time (minutes) spent on medical discussion: 24 minutes  Chief Complaint  Patient presents with  . Irritable Bowel Syndrome    Diarrhea,Acid reflux seems better    HPI:   [name] is a 85 y.o. female who presents for virtual visit regarding: Follow-up on IBS and GERD.  Patient was last seen in our office 02/05/2018 for GERD and IBS diarrhea type.  Status post Nissen fundoplication at [facility].  Last colonoscopy 2001 with no future screenings recommended due to age.  Persistent acute on chronic diarrhea with stool studies positive for Salmonella and treated with Cipro for 7 days given prolonged duration of symptoms and age.  Primary care started her on Questran due to persistent diarrhea after antibiotics.  On probiotics as well, Bentyl as needed.  Her last visit she noted improvement on Bentyl.  Has increased stress recently which tends to flare her symptoms.  Abdominal pain is associated with her stress levels.  No other GI complaints.  Recommended continue current medications, continued H2 receptor blocker (Zantac), can use Imodium on top of Bentyl if needed for a "bad day" and follow-up in 6 months.  Today she states she's doing well. GERD is improved, with minimal to no breakthrough. Denies dysphagia. Still have bouts of diarrhea. Is using Bentyl and Imodium which works for her. There is some ongoing stress with the COVID-19 pandemic. Is also eating better and she feels better  diet has helped. Does have nausea (typically mornings when it happens) but had significant sleepiness with Zofran. Denies hematochezia, melena, fever, chills, unintentional weight loss. Denies URI and flu-like symptoms. Denies chest pain, dyspnea, dizziness, lightheadedness, syncope, near syncope. Denies any other upper or lower GI symptoms.  She is not on Zantac any further, still no GERD symptoms off H2RB.  Past Medical History:  Diagnosis Date  . Ankle swelling    takes torsemide prn  . Anxiety   . Arthritis   . Asthma    allergy induced  . Back pain   . Complication of anesthesia   . Depression   . GERD (gastroesophageal reflux disease)   . Hypercholesterolemia   . Hypothyroid   . Parkinson disease ([place])   . PONV (postoperative nausea and vomiting)   . Seasonal allergies     Past Surgical History:  Procedure Laterality Date  . ABDOMINAL HYSTERECTOMY    . CARPAL TUNNEL RELEASE Right 10/26/2015   Procedure: CARPAL TUNNEL RELEASE;  Surgeon: Daryll Brod, MD;  Location: [place];  Service: Orthopedics;  Laterality: Right;  . CARPOMETACARPEL SUSPENSION PLASTY Right 10/26/2015   Procedure: RIGHT HAND TRAPEZIECTOMY WITH THUMB METACARPEL SUSPENSION PLASTY;  Surgeon: Daryll Brod, MD;  Location: [place];  Service: Orthopedics;  Laterality: Right;  . CATARACT EXTRACTION W/PHACO  12/23/2011   Procedure: CATARACT EXTRACTION PHACO AND INTRAOCULAR LENS PLACEMENT (IOC);  Surgeon: Williams Che, MD;  Location: [facility];  Service: Ophthalmology;  Laterality: Right;  CDE=5.16  . CHOLECYSTECTOMY    . EYE SURGERY  cataract extraction of left eye-[facility]-2002  . KNEE ARTHROSCOPY     right  . NISSEN FUNDOPLICATION     [facility]  . TRIGGER FINGER RELEASE Right 10/26/2015   Procedure: RELEASE A-1 PULLEY RIGHT RING FINGER;  Surgeon: Daryll Brod, MD;  Location: [place];  Service: Orthopedics;  Laterality: Right;    Current Outpatient Medications   Medication Sig Dispense Refill  . albuterol (PROVENTIL) (2.5 MG/3ML) 0.083% nebulizer solution Take 2.5 mg by nebulization every 6 (six) hours as needed for wheezing or shortness of breath.    . ALPRAZolam (XANAX) 0.5 MG tablet Take 0.5-1 tablets (0.25-0.5 mg total) by mouth at bedtime as needed. 45 tablet 1  . Biotin 10 MG CAPS Take 1 capsule by mouth daily.     Marland Kitchen bismuth subsalicylate (PEPTO BISMOL) 262 MG/15ML suspension Take 30 mLs by mouth as needed.    . carbidopa-levodopa (SINEMET IR) 25-100 MG tablet Take 1 tablet by mouth 4 (four) times daily. 360 tablet 2  . cetirizine-pseudoephedrine (ZYRTEC-D) 5-120 MG per tablet Take 1 tablet by mouth daily. For sinus     . Cholecalciferol (D3-1000 PO) Take by mouth daily.    Marland Kitchen dicyclomine (BENTYL) 10 MG capsule TAKE 1 CAPSULE BY MOUTH UP TO TWICE DAILY AS NEEDED FOR ABDOMINAL CRAMPS/DIARRHEA 180 capsule 2  . escitalopram (LEXAPRO) 5 MG tablet Take 10 mg by mouth daily.    . Lactobacillus (FLORAJEN ACIDOPHILUS PO) Take 1 tablet by mouth daily.    Marland Kitchen levothyroxine (SYNTHROID, LEVOTHROID) 25 MCG tablet Take 25 mcg by mouth daily.    Marland Kitchen loperamide (IMODIUM) 2 MG capsule Take by mouth as needed for diarrhea or loose stools.    . mometasone (NASONEX) 50 MCG/ACT nasal spray Place 2 sprays into the nose 2 (two) times daily as needed. For allergies    . montelukast (SINGULAIR) 10 MG tablet Take 10 mg by mouth at bedtime.      . Multiple Vitamins-Minerals (CAL-DAY 1000 PO) Take by mouth daily.    . naproxen (NAPROSYN) 500 MG tablet Take 500 mg by mouth 2 (two) times daily with a meal.    . potassium chloride (MICRO-K) 10 MEQ CR capsule Take 20 mEq by mouth daily.     Marland Kitchen QVAR REDIHALER 80 MCG/ACT AERB INHALATION TAKE 2 PUFFS DAILY TO PREVENT BREATHING PROBLEMS.  7  . torsemide (DEMADEX) 10 MG tablet Take 10 mg by mouth daily. 1/2 to 1 tab daily    . meclizine (ANTIVERT) 25 MG tablet Take 12.5-25 mg by mouth 4 (four) times daily as needed for dizziness.     No  current facility-administered medications for this visit.     Allergies as of 10/08/2018 - Review Complete 10/08/2018  Allergen Reaction Noted  . Cephalosporins Rash 10/19/2010  . Latex Rash 01/17/2011  . Lactose intolerance (gi) Nausea And Vomiting 12/09/2011  . Lactulose Nausea And Vomiting 06/15/2015  . Cephalexin Rash 06/15/2015  . Levofloxacin Anxiety and Rash 10/08/2018  . Meperidine and related Nausea And Vomiting 10/19/2010  . Omeprazole Rash 06/15/2015    Family History  Problem Relation Age of Onset  . Stroke Mother   . COPD Father   . Stroke Father   . Colon cancer Neg Hx     Social History   Socioeconomic History  . Marital status: Married    Spouse name: Not on file  . Number of children: 2  . Years of education: Not on file  . Highest education level: Not on file  Occupational  History    Comment: retired  Scientific laboratory technician  . Financial resource strain: Not on file  . Food insecurity:    Worry: Not on file    Inability: Not on file  . Transportation needs:    Medical: Not on file    Non-medical: Not on file  Tobacco Use  . Smoking status: Never Smoker  . Smokeless tobacco: Never Used  Substance and Sexual Activity  . Alcohol use: Yes    Alcohol/week: 1.0 standard drinks    Types: 1 Glasses of wine per week    Comment: occ  . Drug use: No  . Sexual activity: Not on file  Lifestyle  . Physical activity:    Days per week: Not on file    Minutes per session: Not on file  . Stress: Not on file  Relationships  . Social connections:    Talks on phone: Not on file    Gets together: Not on file    Attends religious service: Not on file    Active member of club or organization: Not on file    Attends meetings of clubs or organizations: Not on file    Relationship status: Not on file  Other Topics Concern  . Not on file  Social History Narrative   Patient lives at home alone and she is widowed.   Retired.    Education one year business course.    Right handed.   Caffeine two cup of coffee daily and one sweet tea.    Review of Systems: General: Negative for anorexia, weight loss, fever, chills, fatigue, weakness. ENT: Negative for hoarseness, difficulty swallowing. CV: Negative for chest pain, angina, palpitations, peripheral edema.  Respiratory: Negative for dyspnea at rest, cough, sputum, wheezing.  GI: See history of present illness. Endo: Negative for unusual weight change.  Heme: Negative for bruising or bleeding. Allergy: Negative for rash or hives.  Physical Exam: Note: limited exam due to virtual visit General:   Alert and oriented. Pleasant and cooperative. Ears:  Normal auditory acuity. Skin:  Intact without facial significant lesions or rashes. Neurologic:  Alert and oriented x4 Psych:  Alert and cooperative. Normal mood and affect.

## 2018-10-08 NOTE — Assessment & Plan Note (Signed)
Chronic history of GERD.  She was previously on Zantac which she stopped due to national recall.  She is not currently having any symptoms.  At this time I will not restart any GERD medications.  If she develops symptoms in the future we can look into other options.  She has had a change in her diet with the COVID-19/coronavirus pandemic which could be helping her GERD symptoms.  Continue current medications and follow-up in 6 months.

## 2018-10-08 NOTE — Assessment & Plan Note (Signed)
Irritable bowel syndrome doing well at this time.  She takes Bentyl and Imodium as needed and this controls her symptoms for the majority of the time.  She is have chronic diarrhea her entire life and typically has intermittent stress.  We discussed that we will likely never be able to eliminate her diarrhea but if we can keep it manageable that is a good goal.  She heartily agreed to this and states that she is used to diarrhea and she is happy with where she is at with her control medications.  Recommend she continue her current medications and follow-up in 6 months.  Call us for any worsening symptoms.

## 2018-10-12 NOTE — Progress Notes (Signed)
CC'D TO PCP °

## 2018-11-23 ENCOUNTER — Telehealth: Payer: Self-pay | Admitting: *Deleted

## 2018-11-23 NOTE — Telephone Encounter (Signed)
Due to current COVID 19 pandemic, our office is severely reducing in office visits until further notice, in order to minimize the risk to our patients and healthcare providers. Unable to get in contact with the patient to convert their office visit with [facility] on 11/25/2018 into a doxy.me visit. I left a voicemail asking the patient to return my call. Office number was provided.     If patient calls back please convert their office visit into a doxy.me visit.

## 2018-11-24 NOTE — Telephone Encounter (Signed)
Noted  

## 2018-11-24 NOTE — Telephone Encounter (Signed)
Pt is wanting to push appt back due to not understanding video visits

## 2018-11-24 NOTE — Telephone Encounter (Signed)
LMVM at [facility] Due to current COVID 19 pandemic, our office is severely reducing in office visits until further notice, in order to minimize the risk to our patients and healthcare providers. Unable to get in contact with the patient to convert their office visit with [facility] on 11/25/2018 into a doxy.me visit. I left a voicemail asking the patient to return my call. Office number was provided.     If patient calls back please convert their office visit into a doxy.me visit.

## 2018-11-25 ENCOUNTER — Ambulatory Visit: Payer: Medicare Other | Admitting: Adult Health

## 2019-01-28 ENCOUNTER — Other Ambulatory Visit: Payer: Self-pay | Admitting: Nurse Practitioner

## 2019-01-28 DIAGNOSIS — K2100 Gastro-esophageal reflux disease with esophagitis, without bleeding: Secondary | ICD-10-CM

## 2019-01-28 DIAGNOSIS — R197 Diarrhea, unspecified: Secondary | ICD-10-CM

## 2019-03-09 ENCOUNTER — Ambulatory Visit (INDEPENDENT_AMBULATORY_CARE_PROVIDER_SITE_OTHER): Payer: Medicare Other | Admitting: Adult Health

## 2019-03-09 ENCOUNTER — Other Ambulatory Visit: Payer: Self-pay

## 2019-03-09 ENCOUNTER — Encounter: Payer: Self-pay | Admitting: Adult Health

## 2019-03-09 VITALS — BP 130/76 | HR 79 | Temp 96.4°F | Ht 62.0 in | Wt 171.0 lb

## 2019-03-09 DIAGNOSIS — G20 Parkinson's disease: Secondary | ICD-10-CM

## 2019-03-09 DIAGNOSIS — G479 Sleep disorder, unspecified: Secondary | ICD-10-CM

## 2019-03-09 DIAGNOSIS — W19XXXA Unspecified fall, initial encounter: Secondary | ICD-10-CM

## 2019-03-09 NOTE — Patient Instructions (Signed)
Your Plan:  Continue Sinemet  Referral to PT Change sleep routine: hot shower before bed, sleep in cold dark room, eliminate screen time 1 hour before bed. No caffeine after lunch.  Can try Melatonin 2 hours before bedtime.   Thank you for coming to see [place] at [facility]. I hope we have been able to provide you high quality care today.  You may receive a patient satisfaction survey over the next few weeks. We would appreciate your feedback and comments so that we may continue to improve ourselves and the health of our patients.

## 2019-03-09 NOTE — Progress Notes (Signed)
PATIENT: [name] DOB: 02/24/1934  REASON FOR VISIT: follow up HISTORY FROM: patient  HISTORY OF PRESENT ILLNESS: Today 03/09/19:  [name] is an 85 year old female with Parkinson's disease.  She returns today for follow-up.  She states that she noticed over the summer that the heat affects her Parkinson's.  Therefore she tries to stay indoors.  She states that her biggest issue is staying asleep.  She states that some nights she does not go to bed till after midnight.  And even then she may wake up several times.  She states that she tried taking Xanax before bed but it makes her sleep a lot.  She states in the past she is also tried melatonin but took it right before bedtime.  She continues on Sinemet but reports that she typically takes it 3 times a day instead of 4.  Reports that there are times that her balance has been off.  She has reported some falls.  She states that she typically falls forward.  Fortunately she is not suffered any significant injuries.  She states that she has been trying to use her cane more.  Denies any trouble eating.  Reports that she does eat slower than before.  She returns today for evaluation.  HISTORY 05-25-2018, Rv for [name], an 85 year old widowed, Caucasian patient with a history of declining gait capacity and stability, she is treated with Sinemet 3 times a day and she has not needed a cane to walk since being on the medication.  She had some spells that appear to be vertiginous and she has undergone vestibular rehab with some success.  The spells are less frequent but they could still occur.  To treat what we thought was essential tremors she has Xanax available and Xanax also helps to treat acute vertigo so she is allowed to take the medication but has to be aware that she cannot drive or operate machinery after taking it.  She is well-groomed pleasant very alert blood pressure and heart rate today are regular normal range.  I would like for  her to stay on the current medication regimen. RV in 6 month.    REVIEW OF SYSTEMS: Out of a complete 14 system review of symptoms, the patient complains only of the following symptoms, and all other reviewed systems are negative.  Dizziness, headache, weakness, tremors, passing out ALLERGIES: Allergies  Allergen Reactions   Cephalosporins Rash   Latex Rash    Breaks out then gets infected   Lactose Intolerance (Gi) Nausea And Vomiting   Lactulose Nausea And Vomiting   Cephalexin Rash   Levofloxacin Anxiety and Rash   Meperidine And Related Nausea And Vomiting    Needed Phenergan to combat nausea.   Omeprazole Rash    HOME MEDICATIONS: Outpatient Medications Prior to Visit  Medication Sig Dispense Refill   albuterol (PROVENTIL) (2.5 MG/3ML) 0.083% nebulizer solution Take 2.5 mg by nebulization every 6 (six) hours as needed for wheezing or shortness of breath.     ALPRAZolam (XANAX) 0.5 MG tablet Take 0.5-1 tablets (0.25-0.5 mg total) by mouth at bedtime as needed. 45 tablet 1   Biotin 10 MG CAPS Take 1 capsule by mouth daily.      bismuth subsalicylate (PEPTO BISMOL) 262 MG/15ML suspension Take 30 mLs by mouth as needed.     carbidopa-levodopa (SINEMET IR) 25-100 MG tablet Take 1 tablet by mouth 4 (four) times daily. 360 tablet 2   cetirizine-pseudoephedrine (ZYRTEC-D) 5-120 MG per  tablet Take 1 tablet by mouth daily. For sinus      Cholecalciferol (D3-1000 PO) Take by mouth daily.     dicyclomine (BENTYL) 10 MG capsule TAKE 1 CAPSULE BY MOUTH UP TO TWICE DAILY AS NEEDED FOR ABDOMINAL CRAMPS/DIARRHEA 180 capsule 2   escitalopram (LEXAPRO) 5 MG tablet Take 10 mg by mouth daily.     Lactobacillus (FLORAJEN ACIDOPHILUS PO) Take 1 tablet by mouth daily.     levothyroxine (SYNTHROID, LEVOTHROID) 25 MCG tablet Take 25 mcg by mouth daily.     loperamide (IMODIUM) 2 MG capsule Take by mouth as needed for diarrhea or loose stools.     meclizine (ANTIVERT) 25 MG  tablet Take 12.5-25 mg by mouth 4 (four) times daily as needed for dizziness.     mometasone (NASONEX) 50 MCG/ACT nasal spray Place 2 sprays into the nose 2 (two) times daily as needed. For allergies     montelukast (SINGULAIR) 10 MG tablet Take 10 mg by mouth at bedtime.       Multiple Vitamins-Minerals (CAL-DAY 1000 PO) Take by mouth daily.     naproxen (NAPROSYN) 500 MG tablet Take 500 mg by mouth 2 (two) times daily with a meal.     potassium chloride (MICRO-K) 10 MEQ CR capsule Take 20 mEq by mouth daily.      QVAR REDIHALER 80 MCG/ACT AERB INHALATION TAKE 2 PUFFS DAILY TO PREVENT BREATHING PROBLEMS.  7   torsemide (DEMADEX) 10 MG tablet Take 10 mg by mouth daily. 1/2 to 1 tab daily     No facility-administered medications prior to visit.     PAST MEDICAL HISTORY: Past Medical History:  Diagnosis Date   Ankle swelling    takes torsemide prn   Anxiety    Arthritis    Asthma    allergy induced   Back pain    Complication of anesthesia    Depression    GERD (gastroesophageal reflux disease)    Hypercholesterolemia    Hypothyroid    Parkinson disease ([place])    PONV (postoperative nausea and vomiting)    Seasonal allergies     PAST SURGICAL HISTORY: Past Surgical History:  Procedure Laterality Date   ABDOMINAL HYSTERECTOMY     CARPAL TUNNEL RELEASE Right 10/26/2015   Procedure: CARPAL TUNNEL RELEASE;  Surgeon: Daryll Brod, MD;  Location: [place];  Service: Orthopedics;  Laterality: Right;   CARPOMETACARPEL SUSPENSION PLASTY Right 10/26/2015   Procedure: RIGHT HAND TRAPEZIECTOMY WITH THUMB METACARPEL SUSPENSION PLASTY;  Surgeon: Daryll Brod, MD;  Location: [place];  Service: Orthopedics;  Laterality: Right;   CATARACT EXTRACTION W/PHACO  12/23/2011   Procedure: CATARACT EXTRACTION PHACO AND INTRAOCULAR LENS PLACEMENT (IOC);  Surgeon: Williams Che, MD;  Location: [facility];  Service: Ophthalmology;  Laterality: Right;   CDE=5.16   CHOLECYSTECTOMY     EYE SURGERY     cataract extraction of left eye-[facility]-2002   KNEE ARTHROSCOPY     right   NISSEN FUNDOPLICATION     [facility]   TRIGGER FINGER RELEASE Right 10/26/2015   Procedure: RELEASE A-1 PULLEY RIGHT RING FINGER;  Surgeon: Daryll Brod, MD;  Location: [place];  Service: Orthopedics;  Laterality: Right;    FAMILY HISTORY: Family History  Problem Relation Age of Onset   Stroke Mother    COPD Father    Stroke Father    Colon cancer Neg Hx     SOCIAL HISTORY: Social History   Socioeconomic History  Marital status: Married    Spouse name: Not on file   Number of children: 2   Years of education: Not on file   Highest education level: Not on file  Occupational History    Comment: retired  Scientist, product/process development strain: Not on file   Food insecurity    Worry: Not on file    Inability: Not on Lexicographer needs    Medical: Not on file    Non-medical: Not on file  Tobacco Use   Smoking status: Never Smoker   Smokeless tobacco: Never Used  Substance and Sexual Activity   Alcohol use: Yes    Alcohol/week: 1.0 standard drinks    Types: 1 Glasses of wine per week    Comment: occ   Drug use: No   Sexual activity: Not on file  Lifestyle   Physical activity    Days per week: Not on file    Minutes per session: Not on file   Stress: Not on file  Relationships   Social connections    Talks on phone: Not on file    Gets together: Not on file    Attends religious service: Not on file    Active member of club or organization: Not on file    Attends meetings of clubs or organizations: Not on file    Relationship status: Not on file   Intimate partner violence    Fear of current or ex partner: Not on file    Emotionally abused: Not on file    Physically abused: Not on file    Forced sexual activity: Not on file  Other Topics Concern   Not on file  Social History Narrative    Patient lives at home alone and she is widowed.   Retired.    Education one year business course.   Right handed.   Caffeine two cup of coffee daily and one sweet tea.      PHYSICAL EXAM  Vitals:   03/09/19 0930  BP: 130/76  Pulse: 79  Temp: (!) 96.4 F (35.8 C)  TempSrc: Oral  Weight: 171 lb (77.6 kg)  Height: 5' 2"  (1.575 m)   Body mass index is 31.28 kg/m.  Generalized: Well developed, in no acute distress   Neurological examination  Mentation: Alert oriented to time, place, history taking. Follows all commands speech and language fluent Cranial nerve II-XII: Pupils were equal round reactive to light. Extraocular movements were full, visual field were full on confrontational test.  Head turning and shoulder shrug  were normal and symmetric. Motor: The motor testing reveals 5 over 5 strength of all 4 extremities. Good symmetric motor tone is noted throughout.  Mild impairment of finger taps. Sensory: Sensory testing is intact to soft touch on all 4 extremities. No evidence of extinction is noted.  Coordination: Cerebellar testing reveals good finger-nose-finger and heel-to-shin bilaterally.  Gait and station: Patient uses a cane when ambulating.  Tandem gait not attempted. Reflexes: Deep tendon reflexes are symmetric and normal bilaterally.   DIAGNOSTIC DATA (LABS, IMAGING, TESTING) - I reviewed patient records, labs, notes, testing and imaging myself where available.  Lab Results  Component Value Date   WBC 4.3 08/14/2018   HGB 12.7 08/14/2018   HCT 40.4 08/14/2018   MCV 97.3 08/14/2018   PLT 200 08/14/2018      Component Value Date/Time   NA 139 10/24/2015 1038   K 4.1 10/24/2015 1038   CL 106 10/24/2015 1038  CO2 26 10/24/2015 1038   GLUCOSE 93 10/24/2015 1038   BUN 13 10/24/2015 1038   CREATININE 0.88 10/24/2015 1038   CALCIUM 8.9 10/24/2015 1038   GFRNONAA >60 10/24/2015 1038   GFRAA >60 10/24/2015 1038      ASSESSMENT AND PLAN 85 y.o. year old  female  has a past medical history of Ankle swelling, Anxiety, Arthritis, Asthma, Back pain, Complication of anesthesia, Depression, GERD (gastroesophageal reflux disease), Hypercholesterolemia, Hypothyroid, Parkinson disease ([place]), PONV (postoperative nausea and vomiting), and Seasonal allergies. here with:  1.  Parkinson's disease 2.  Sleep disturbance 3.  Frequent falls   Overall the patient is doing fairly well.  She will continue on Sinemet taking it 3-4 times a day.  We talked about sleep hygiene.  I encouraged the patient to avoid caffeine after lunch, sleep in a cold dark room, eliminate screen time 1 hour before bedtime and advised that she could retry melatonin.  She still has some at home unsure of the dose.  Advised that she should take it approximately 2 hours before her set bedtime.  I will also refer to physical therapy due to frequent falls.  She is advised that if her symptoms worsen or she develops new symptoms she should let us know.      Ward Givens, MSN, NP-C 03/09/2019, 10:13 AM [facility] [address], [place], [place] [address] [phone]

## 2019-03-16 ENCOUNTER — Ambulatory Visit (HOSPITAL_COMMUNITY): Payer: Medicare Other | Attending: Family Medicine | Admitting: Physical Therapy

## 2019-03-16 ENCOUNTER — Other Ambulatory Visit: Payer: Self-pay

## 2019-03-16 ENCOUNTER — Encounter (HOSPITAL_COMMUNITY): Payer: Self-pay | Admitting: Physical Therapy

## 2019-03-16 DIAGNOSIS — R2681 Unsteadiness on feet: Secondary | ICD-10-CM | POA: Diagnosis present

## 2019-03-16 DIAGNOSIS — R293 Abnormal posture: Secondary | ICD-10-CM | POA: Diagnosis present

## 2019-03-16 DIAGNOSIS — M6281 Muscle weakness (generalized): Secondary | ICD-10-CM | POA: Insufficient documentation

## 2019-03-16 DIAGNOSIS — R29898 Other symptoms and signs involving the musculoskeletal system: Secondary | ICD-10-CM | POA: Diagnosis present

## 2019-03-16 NOTE — Patient Instructions (Addendum)
Strengthening: Hip Abduction (Side-Lying)    Tighten muscles on front of left thigh, then lift leg __15__ inches from surface, keeping knee locked.  Repeat __10__ times per set. Do _1___ sets per session. Do __2__ sessions per day.  http://orth.exer.us/622   Copyright  VHI. All rights reserved.  Bridging    Slowly raise buttocks from floor, keeping stomach tight. Repeat _10___ times per set. Do __1_ sets per session. Do __2__ sessions per day.  http://orth.exer.us/1096   Copyright  VHI. All rights reserved.  Functional Quadriceps: Sit to Stand    Sit on edge of chair, feet flat on floor. Stand upright, extending knees fully. Repeat 5_-10___ times per set. Do __1__ sets per session. Do __2__ sessions per day.  http://orth.exer.us/734   Copyright  VHI. All rights reserved.

## 2019-03-16 NOTE — Therapy (Addendum)
[place], [place], [address] Phone: [phone]   Fax:  [phone]  Physical Therapy Evaluation  Patient Details  Name: [name] MRN: [id number] Date of Birth: 1933-08-20 Referring Provider (PT): Trixie Deis    Encounter Date: 03/16/2019  PT End of Session - 03/16/19 1621    Visit Number  1    Number of Visits  15    Date for PT Re-Evaluation  04/15/19    Authorization Type  [employer] Medicare    PT Start Time  1325    PT Stop Time  1405    PT Time Calculation (min)  40 min    Activity Tolerance  Patient tolerated treatment well    Behavior During Therapy  [facility] for tasks assessed/performed       Past Medical History:  Diagnosis Date  . Ankle swelling    takes torsemide prn  . Anxiety   . Arthritis   . Asthma    allergy induced  . Back pain   . Complication of anesthesia   . Depression   . GERD (gastroesophageal reflux disease)   . Hypercholesterolemia   . Hypothyroid   . Parkinson disease ([place])   . PONV (postoperative nausea and vomiting)   . Seasonal allergies     Past Surgical History:  Procedure Laterality Date  . ABDOMINAL HYSTERECTOMY    . CARPAL TUNNEL RELEASE Right 10/26/2015   Procedure: CARPAL TUNNEL RELEASE;  Surgeon: Daryll Brod, MD;  Location: [place];  Service: Orthopedics;  Laterality: Right;  . CARPOMETACARPEL SUSPENSION PLASTY Right 10/26/2015   Procedure: RIGHT HAND TRAPEZIECTOMY WITH THUMB METACARPEL SUSPENSION PLASTY;  Surgeon: Daryll Brod, MD;  Location: [place];  Service: Orthopedics;  Laterality: Right;  . CATARACT EXTRACTION W/PHACO  12/23/2011   Procedure: CATARACT EXTRACTION PHACO AND INTRAOCULAR LENS PLACEMENT (IOC);  Surgeon: Williams Che, MD;  [facility];  Service: Ophthalmology;  Laterality: Right;  CDE=5.16  . CHOLECYSTECTOMY    . EYE SURGERY     cataract extraction of left eye-[facility]-2002  . KNEE ARTHROSCOPY     right  .  NISSEN FUNDOPLICATION     [facility]  . TRIGGER FINGER RELEASE Right 10/26/2015   Procedure: RELEASE A-1 PULLEY RIGHT RING FINGER;  Surgeon: Daryll Brod, MD;  Location: [place];  Service: Orthopedics;  Laterality: Right;    There were no vitals filed for this visit.   Subjective Assessment - 03/16/19 1337    Subjective  [name] states that she is unsteady with her walking everyday.  It seems that she can trip over anything and fall.  She has been trying to use a cane and has a walking stick for her yard.  [place] but she does not feel safe doing this any longer.    Pertinent History  parkinson, OA, scoliosis,    Limitations  House hold activities;Lifting;Walking    How long can you sit comfortably?  no problem    How long can you stand comfortably?  ok    How long can you walk comfortably?  15 minutes at a time    Currently in Pain?  Yes    Pain Score  3    can get to the point where she can not move.   Pain Location  Back    Pain Orientation  Lower    Pain Descriptors / Indicators  Aching    Pain Type  Chronic pain    Pain Onset  More than a month ago    Pain Frequency  Constant    Aggravating Factors   activity    Pain Relieving Factors  rest    Effect of Pain on Daily Activities  limits m         [facility] PT Assessment - 03/16/19 0001      Assessment   Medical Diagnosis  Frequent falls    Referring Provider (PT)  Megan Millikin     Onset Date/Surgical Date  12/09/18    Prior Therapy  yes 3 visits only       Balance Screen   Has the patient fallen in the past 6 months  Yes    How many times?  6 at least     Has the patient had a decrease in activity level because of a fear of falling?   Yes    Is the patient reluctant to leave their home because of a fear of falling?   No      Home Environment   Living Environment  Private residence    Home Access  Stairs to enter    Entrance Stairs-Number of Steps  3     Entrance Stairs-Rails  Can reach both      Cognition   Overall Cognitive Status  Within Functional Limits for tasks assessed      Observation/Other Assessments   Focus on Therapeutic Outcomes (FOTO)   49      Functional Tests   Functional tests  Single leg stance;Sit to Stand      Single Leg Stance   Comments  Rt 1 sec; LT 1 sec       Sit to Stand   Comments  5 x 19.58       Posture/Postural Control   Posture/Postural Control  Postural limitations    Postural Limitations  Rounded Shoulders;Forward head;Increased thoracic kyphosis;Decreased lumbar lordosis;Flexed trunk      ROM / Strength   AROM / PROM / Strength  Strength      Strength   Strength Assessment Site  Hip    Right/Left Hip  Right;Left    Right Hip Flexion  4/5    Right Hip Extension  3-/5    Right Hip ABduction  3/5    Left Hip Flexion  4/5    Left Hip Extension  4-/5    Left Hip ABduction  3+/5      Ambulation/Gait   Ambulation Distance (Feet)  456 Feet   3 MWT with one off balance    Assistive device  None                Objective measurements completed on examination: See above findings.      [place] Adult PT Treatment/Exercise - 03/16/19 0001      Exercises   Exercises  Knee/Hip      Knee/Hip Exercises: Seated   Other Seated Knee/Hip Exercises  sit as tall as possible x 10      Knee/Hip Exercises: Supine   Bridges  10 reps      Knee/Hip Exercises: Sidelying   Hip ABduction  10 reps             PT Education - 03/16/19 1620    Education Details  HEP    Person(s) Educated  Patient    Methods  Explanation;Demonstration;Handout    Comprehension  Verbalized understanding;Returned demonstration  PT Short Term Goals - 03/16/19 1630      PT SHORT TERM GOAL #1   Title  Pt to be I in HEP to improve posture to decreae lumbar pain as well as improve overall balance    Time  3    Period  Weeks    Status  New    Target Date  04/06/19      PT SHORT TERM GOAL #2   Title   PT to be able to single leg stance on both LE for 10 secoonds in order to decrease pt risk of falling, ( no falls in the past week)    Time  3    Period  Weeks    Status  New      PT SHORT TERM GOAL #3   Title  Pt core and LE strengh to be improved 1/2 grade to allow pt to be able to come sit to stand from low lying chair without losing her balance.    Time  3    Period  Weeks    Status  New        PT Long Term Goals - 03/16/19 1634      PT LONG TERM GOAL #1   Title  PT balance to have improved to allow pt to single leg stance for 20" or more on both LE to allow pt to feel confident walking outside to her pond with a walking stick.   Time  6    Period  Weeks    Status  New    Target Date  04/27/19      PT LONG TERM GOAL #2   Title  PT to have had not falls in the past 4 weeks    Time  6    Period  Weeks    Status  New      PT LONG TERM GOAL #3   Title  PT core and LE strength to be increased one grade to allow pt to feel confident walking for 30 mintues without an assisitve device.    Time  6    Period  Weeks    Status  New             Plan - 03/16/19 1624    Clinical Impression Statement  [name] is an 85 yo female who has been referred to skilled PT due to multiple falls in the recent months.  The pt states that she normally trips over something. PMH includes OA, chronic LBP, HTN and parkinsons.  Evaluation demonstrates postural dysfunction, decrease core and hip strength bilaterally, decreased balance and decreased activity tolerance.  [name] will benefit from skilled PT to address these issues and improve [name] ability to complete her normal ADL's.    Personal Factors and Comorbidities  Age;Comorbidity 2;Time since onset of injury/illness/exacerbation    Comorbidities  OA< parkinsons, HTN, Chronic neck and back pain    Examination-Activity Limitations  Bed Mobility;Bend;Carry;Dressing;Lift;Locomotion Level;Sit;Squat;Stairs;Stand     Examination-Participation Restrictions  Church;Cleaning;Community Activity;Laundry;Yard Work    [employer]  Evolving/Moderate complexity    Clinical Decision Making  Moderate    Rehab Potential  Good    PT Frequency  3x / week    PT Duration  3 weeks   then 2x a week for 3 weeks   PT Treatment/Interventions     Plan  ADLs/Self Care Home Management;Functional mobility training;Gait training;Therapeutic activities;Therapeutic exercise;Balance training.re-education;Patient/family education;Manual techniques    Work on improving postural,  and core strengthening as well as balance and high level gait activity   PT Home Exercise Plan  sitting tall, bridge and hip abduction    Consulted and Agree with Plan of Care  Patient       Patient will benefit from skilled therapeutic intervention in order to improve the following deficits and impairments:  Abnormal gait, Decreased activity tolerance, Decreased balance, Decreased range of motion, Decreased strength, Difficulty walking, Postural dysfunction, Pain  Visit Diagnosis: Unsteadiness on feet  Abnormal posture  Muscle weakness (generalized)     Problem List Patient Active Problem List   Diagnosis Date Noted  . IBS (irritable bowel syndrome) 02/05/2018  . Diarrhea 06/16/2017  . Nausea without vomiting 06/16/2017  . Numbness 08/18/2015  . Polyneuropathy 08/18/2015  . Menopausal syndrome (hot flushes) 06/15/2015  . Tremor, essential 06/15/2015  . Gastroesophageal reflux disease with esophagitis 06/15/2015  . Bilateral carpal tunnel syndrome 06/15/2015  . Resting tremor 06/15/2015  . Scoliosis of thoracic spine 11/05/2013  . Spinal stenosis, unspecified region other than cervical 12/03/2012  . Spondylosis of unspecified site without mention of myelopathy 12/03/2012  . Parkinson disease ([place])   . Back pain   . Seasonal allergies    [name], [name] CLT [phone] 03/16/2019, 4:39 PM  [place] [address] [place], [place], [address] Phone: [phone]   Fax:  [phone]  Name: [name] MRN: [id number] Date of Birth: [birth year]

## 2019-03-18 ENCOUNTER — Other Ambulatory Visit: Payer: Self-pay

## 2019-03-18 ENCOUNTER — Ambulatory Visit (HOSPITAL_COMMUNITY): Payer: Medicare Other | Admitting: Physical Therapy

## 2019-03-18 ENCOUNTER — Other Ambulatory Visit: Payer: Self-pay | Admitting: Neurology

## 2019-03-18 DIAGNOSIS — R293 Abnormal posture: Secondary | ICD-10-CM

## 2019-03-18 DIAGNOSIS — R2681 Unsteadiness on feet: Secondary | ICD-10-CM | POA: Diagnosis not present

## 2019-03-18 DIAGNOSIS — M6281 Muscle weakness (generalized): Secondary | ICD-10-CM

## 2019-03-18 DIAGNOSIS — R29898 Other symptoms and signs involving the musculoskeletal system: Secondary | ICD-10-CM

## 2019-03-18 NOTE — Therapy (Signed)
[place], [place], [address] Phone: [phone]   Fax:  [phone]  Physical Therapy Treatment  Patient Details  Name: [name] MRN: [id number] Date of Birth: 01-Jun-1934 Referring Provider (PT): Trixie Deis    Encounter Date: 03/18/2019  PT End of Session - 03/18/19 1439    Visit Number  2    Number of Visits  15    Date for PT Re-Evaluation  04/15/19    Authorization Type  [employer] Medicare    PT Start Time  1320    PT Stop Time  1400    PT Time Calculation (min)  40 min    Activity Tolerance  Patient tolerated treatment well    Behavior During Therapy  [facility] for tasks assessed/performed       Past Medical History:  Diagnosis Date  . Ankle swelling    takes torsemide prn  . Anxiety   . Arthritis   . Asthma    allergy induced  . Back pain   . Complication of anesthesia   . Depression   . GERD (gastroesophageal reflux disease)   . Hypercholesterolemia   . Hypothyroid   . Parkinson disease ([place])   . PONV (postoperative nausea and vomiting)   . Seasonal allergies     Past Surgical History:  Procedure Laterality Date  . ABDOMINAL HYSTERECTOMY    . CARPAL TUNNEL RELEASE Right 10/26/2015   Procedure: CARPAL TUNNEL RELEASE;  Surgeon: Daryll Brod, MD;  Location: [place];  Service: Orthopedics;  Laterality: Right;  . CARPOMETACARPEL SUSPENSION PLASTY Right 10/26/2015   Procedure: RIGHT HAND TRAPEZIECTOMY WITH THUMB METACARPEL SUSPENSION PLASTY;  Surgeon: Daryll Brod, MD;  Location: [place];  Service: Orthopedics;  Laterality: Right;  . CATARACT EXTRACTION W/PHACO  12/23/2011   Procedure: CATARACT EXTRACTION PHACO AND INTRAOCULAR LENS PLACEMENT (IOC);  Surgeon: Williams Che, MD;  [facility];  Service: Ophthalmology;  Laterality: Right;  CDE=5.16  . CHOLECYSTECTOMY    . EYE SURGERY     cataract extraction of left eye-[facility]-2002  . KNEE ARTHROSCOPY     right  .  NISSEN FUNDOPLICATION     [facility]  . TRIGGER FINGER RELEASE Right 10/26/2015   Procedure: RELEASE A-1 PULLEY RIGHT RING FINGER;  Surgeon: Daryll Brod, MD;  Location: [place];  Service: Orthopedics;  Laterality: Right;    There were no vitals filed for this visit.                    [place] Adult PT Treatment/Exercise - 03/18/19 0001      Knee/Hip Exercises: Stretches   Active Hamstring Stretch  Both;2 reps;20 seconds;Limitations    Active Hamstring Stretch Limitations  instructed both in supine and long sitting      Knee/Hip Exercises: Standing   SLS  unable to assume    SLS with Vectors  2X3" each with bilateral UE assist    Other Standing Knee Exercises  against wall tall X 39mnute    Other Standing Knee Exercises  tandem max 4" Lt leading and 10" Rt leading      Knee/Hip Exercises: Seated   Other Seated Knee/Hip Exercises  sit as tall as possible x 10    Sit to Sand  2 sets;5 reps;without UE support      Knee/Hip Exercises: Supine   Bridges  10 reps      Knee/Hip Exercises: Sidelying   Hip ABduction  10  reps             PT Education - 03/18/19 1438    Education Details  review of HEP, importance of stretching and maintaining upright posturing.  Review of goals    Person(s) Educated  Patient    Methods  Explanation;Demonstration;Tactile cues;Verbal cues    Comprehension  Verbalized understanding;Verbal cues required;Returned demonstration;Tactile cues required;Need further instruction       PT Short Term Goals - 03/18/19 1333      PT SHORT TERM GOAL #1   Title  Pt to be I in HEP to improve posture to decreae lumbar pain as well as improve overall balance    Time  3    Period  Weeks    Status  On-going    Target Date  04/06/19      PT SHORT TERM GOAL #2   Title  PT to be able to single leg stance on both LE for 10 secoonds in order to decrease pt risk of falling, ( no falls in the past week)    Time  3    Period  Weeks    Status   On-going      PT SHORT TERM GOAL #3   Title  Pt core and LE strengh to be improved 1/2 grade to allow pt to be able to come sit to stand from low lying chair without losing her balance.    Time  3    Period  Weeks    Status  On-going        PT Long Term Goals - 03/18/19 1333      PT LONG TERM GOAL #1   Title  PT balance to have improved to allow pt to single leg stance for 20" or more on both LE to allow pt to feel confident walking to her pond using a walking stick.    Time  6    Period  Weeks    Status  On-going      PT LONG TERM GOAL #2   Title  PT to have had not falls in the past 4 weeks    Time  6    Period  Weeks    Status  On-going      PT LONG TERM GOAL #3   Title  PT core and LE strength to be increased one grade to allow pt to feel confident walking for 30 mintues without an assisitve device.    Time  6    Period  Weeks    Status  On-going            Plan - 03/18/19 1339    Clinical Impression Statement  pt with "funny feeling" upon initially laying supine today explained as a little dizziness but not vertigo.  States she also gets this feeling when she's extending her head back to put in eye drops.  REviewed goals and HEP.  pt unable to recall exercises or complete in correct form requiring max verbal and tactile cues. Added hamstring stretch today as patient had multiple hamstring spasms while completing bridge activity.  Began static balance actvities with inability to assume single leg stance or maintain tandem greater than 4" with Lt and 10" with  Rt leading.  worked on posture sitting and standing tall and sit to stand for LE strengthening.  pt fatigued easily with noted fatigue at several intervals requiring rest breaks.    Personal Factors and Comorbidities  Age;Comorbidity 2;Time since onset of injury/illness/exacerbation  Comorbidities  OA< parkinsons, HTN, Chronic neck and back pain    Examination-Activity Limitations  Bed  Mobility;Bend;Carry;Dressing;Lift;Locomotion Level;Sit;Squat;Stairs;Stand    Examination-Participation Restrictions  Church;Cleaning;Community Activity;Laundry;Yard Work    Stability/Clinical Decision Making  Evolving/Moderate complexity    Rehab Potential  Good    PT Frequency  3x / week    PT Duration  3 weeks   then 2x a week for 3 weeks   PT Treatment/Interventions  ADLs/Self Care Home Management;Functional mobility training;Gait training;Therapeutic activities;Therapeutic exercise;Balance training;Neuromuscular re-education;Patient/family education;Manual techniques    PT Next Visit Plan  continue to improve posture, LE strength, balance and reduce pain.  Review HEP and ensure compliance.  Next session begin heel/toe raise, standing hip and hamstring strengthening.    PT Home Exercise Plan  sitting tall, bridge and hip abduction    Consulted and Agree with Plan of Care  Patient       Patient will benefit from skilled therapeutic intervention in order to improve the following deficits and impairments:  Abnormal gait, Decreased activity tolerance, Decreased balance, Decreased range of motion, Decreased strength, Difficulty walking, Postural dysfunction, Pain  Visit Diagnosis: Unsteadiness on feet  Abnormal posture  Muscle weakness (generalized)  Other symptoms and signs involving the musculoskeletal system     Problem List Patient Active Problem List   Diagnosis Date Noted  . IBS (irritable bowel syndrome) 02/05/2018  . Diarrhea 06/16/2017  . Nausea without vomiting 06/16/2017  . Numbness 08/18/2015  . Polyneuropathy 08/18/2015  . Menopausal syndrome (hot flushes) 06/15/2015  . Tremor, essential 06/15/2015  . Gastroesophageal reflux disease with esophagitis 06/15/2015  . Bilateral carpal tunnel syndrome 06/15/2015  . Resting tremor 06/15/2015  . Scoliosis of thoracic spine 11/05/2013  . Spinal stenosis, unspecified region other than cervical 12/03/2012  . Spondylosis of  unspecified site without mention of myelopathy 12/03/2012  . Parkinson disease ([place])   . Back pain   . Seasonal allergies    Teena Irani, PTA/CLT [phone]  Teena Irani 03/18/2019, 2:39 PM  [place] [address] [place], [place], [address] Phone: [phone]   Fax:  [phone]  Name: [name] MRN: [id number] Date of Birth: [birth year]

## 2019-03-23 ENCOUNTER — Ambulatory Visit (HOSPITAL_COMMUNITY): Payer: Medicare Other | Admitting: Physical Therapy

## 2019-03-23 ENCOUNTER — Other Ambulatory Visit: Payer: Self-pay

## 2019-03-23 ENCOUNTER — Encounter (HOSPITAL_COMMUNITY): Payer: Self-pay | Admitting: Physical Therapy

## 2019-03-23 DIAGNOSIS — R293 Abnormal posture: Secondary | ICD-10-CM

## 2019-03-23 DIAGNOSIS — R2681 Unsteadiness on feet: Secondary | ICD-10-CM

## 2019-03-23 DIAGNOSIS — R29898 Other symptoms and signs involving the musculoskeletal system: Secondary | ICD-10-CM

## 2019-03-23 DIAGNOSIS — M6281 Muscle weakness (generalized): Secondary | ICD-10-CM

## 2019-03-23 NOTE — Therapy (Signed)
[place], [place], [address] Phone: [phone]   Fax:  [phone]  Physical Therapy Treatment  Patient Details  Name: [name] MRN: [id number] Date of Birth: 1934/01/21 Referring Provider (PT): Trixie Deis    Encounter Date: 03/23/2019  PT End of Session - 03/23/19 1229    Visit Number  3    Number of Visits  15    Date for PT Re-Evaluation  04/15/19    Authorization Type  UHC Medicare    PT Start Time  1142   PT late for appointment   PT Stop Time  1215    PT Time Calculation (min)  33 min    Activity Tolerance  Patient tolerated treatment well    Behavior During Therapy  [facility] for tasks assessed/performed       Past Medical History:  Diagnosis Date  . Ankle swelling    takes torsemide prn  . Anxiety   . Arthritis   . Asthma    allergy induced  . Back pain   . Complication of anesthesia   . Depression   . GERD (gastroesophageal reflux disease)   . Hypercholesterolemia   . Hypothyroid   . Parkinson disease ([place])   . PONV (postoperative nausea and vomiting)   . Seasonal allergies     Past Surgical History:  Procedure Laterality Date  . ABDOMINAL HYSTERECTOMY    . CARPAL TUNNEL RELEASE Right 10/26/2015   Procedure: CARPAL TUNNEL RELEASE;  Surgeon: Daryll Brod, MD;  Location: [place];  Service: Orthopedics;  Laterality: Right;  . CARPOMETACARPEL SUSPENSION PLASTY Right 10/26/2015   Procedure: RIGHT HAND TRAPEZIECTOMY WITH THUMB METACARPEL SUSPENSION PLASTY;  Surgeon: Daryll Brod, MD;  Location: [place];  Service: Orthopedics;  Laterality: Right;  . CATARACT EXTRACTION W/PHACO  12/23/2011   Procedure: CATARACT EXTRACTION PHACO AND INTRAOCULAR LENS PLACEMENT (IOC);  Surgeon: Williams Che, MD;  [facility];  Service: Ophthalmology;  Laterality: Right;  CDE=5.16  . CHOLECYSTECTOMY    . EYE SURGERY     cataract extraction of left eye-[facility]-2002  . KNEE  ARTHROSCOPY     right  . NISSEN FUNDOPLICATION     [facility]  . TRIGGER FINGER RELEASE Right 10/26/2015   Procedure: RELEASE A-1 PULLEY RIGHT RING FINGER;  Surgeon: Daryll Brod, MD;  Location: [place];  Service: Orthopedics;  Laterality: Right;    There were no vitals filed for this visit.  Subjective Assessment - 03/23/19 1146    Subjective  Ms. Bougher states that she is unsteady with her walking everyday.  It seems that she can trip over anything and fall.  She has been trying to use a cane and has a walking stick for her yard.  [place] but she does not feel safe doing this any longer.    Pertinent History  parkinson, OA, scoliosis,    Limitations  House hold activities;Lifting;Walking    How long can you sit comfortably?  no problem    How long can you stand comfortably?  ok    How long can you walk comfortably?  15 minutes at a time    Currently in Pain?  Yes    Pain Score  3     Pain Location  Back    Pain Orientation  Lower    Pain Descriptors / Indicators  Aching    Pain Type  Chronic pain    Pain Onset  More than a month ago          03/23/19 1225  Balance Exercises: Standing  Standing Eyes Opened Narrow base of support (BOS);5 reps (2# bar into flexion then pushing out )  Tandem Stance Eyes open;5 reps;Limitations (With head turns )  SLS Eyes open;5 reps;Intermittent upper extremity support  Sidestepping 2 reps;Theraband (GRN)  Marching Limitations 10  Heel Raises Limitations 10  Sit to Stand Time 10  Other Standing Exercises foot on washcloth ABduction and extension x 10 reps bilaterallly.       PT Short Term Goals - 03/18/19 1333      PT SHORT TERM GOAL #1   Title  Pt to be I in HEP to improve posture to decreae lumbar pain as well as improve overall balance    Time  3    Period  Weeks    Status  On-going    Target Date  04/06/19      PT SHORT TERM GOAL #2   Title  PT to be able to single leg  stance on both LE for 10 secoonds in order to decrease pt risk of falling, ( no falls in the past week)    Time  3    Period  Weeks    Status  On-going      PT SHORT TERM GOAL #3   Title  Pt core and LE strengh to be improved 1/2 grade to allow pt to be able to come sit to stand from low lying chair without losing her balance.    Time  3    Period  Weeks    Status  On-going        PT Long Term Goals - 03/18/19 1333      PT LONG TERM GOAL #1   Title  PT balance to have improved to allow pt to single leg stance for 20" or more on both LE to allow pt to feel confident walking to her pond using a walking stick.    Time  6    Period  Weeks    Status  On-going      PT LONG TERM GOAL #2   Title  PT to have had not falls in the past 4 weeks    Time  6    Period  Weeks    Status  On-going      PT LONG TERM GOAL #3   Title  PT core and LE strength to be increased one grade to allow pt to feel confident walking for 30 mintues without an assisitve device.    Time  6    Period  Weeks    Status  On-going            Plan - 03/23/19 1229    Clinical Impression Statement  Today's treatment focused mainly on balance with noted instabilities with balance exercises needing contact gaurd assist.  PT needed one short resting break during treatment.    Personal Factors and Comorbidities  Age;Comorbidity 2;Time since onset of injury/illness/exacerbation    Comorbidities  OA< parkinsons, HTN, Chronic neck and back pain    Examination-Activity Limitations  Bed Mobility;Bend;Carry;Dressing;Lift;Locomotion Level;Sit;Squat;Stairs;Stand    Examination-Participation Restrictions  Church;Cleaning;Community Activity;Laundry;Yard Work    Stability/Clinical Decision Making  Evolving/Moderate complexity    Rehab Potential  Good    PT Frequency  3x / week    PT Duration  3 weeks   then 2x a  week for 3 weeks   PT Treatment/Interventions  ADLs/Self Care Home Management;Functional mobility training;Gait  training;Therapeutic activities;Therapeutic exercise;Balance training;Neuromuscular re-education;Patient/family education;Manual techniques    PT Next Visit Plan  continue to improve posture, LE strength, balance and reduce pain.    Next session begin toe raise, standing hamstring strengthening. Update HEP    PT Home Exercise Plan  sitting tall, bridge and hip abduction    Consulted and Agree with Plan of Care  Patient       Patient will benefit from skilled therapeutic intervention in order to improve the following deficits and impairments:  Abnormal gait, Decreased activity tolerance, Decreased balance, Decreased range of motion, Decreased strength, Difficulty walking, Postural dysfunction, Pain  Visit Diagnosis: Unsteadiness on feet  Abnormal posture  Muscle weakness (generalized)  Other symptoms and signs involving the musculoskeletal system     Problem List Patient Active Problem List   Diagnosis Date Noted  . IBS (irritable bowel syndrome) 02/05/2018  . Diarrhea 06/16/2017  . Nausea without vomiting 06/16/2017  . Numbness 08/18/2015  . Polyneuropathy 08/18/2015  . Menopausal syndrome (hot flushes) 06/15/2015  . Tremor, essential 06/15/2015  . Gastroesophageal reflux disease with esophagitis 06/15/2015  . Bilateral carpal tunnel syndrome 06/15/2015  . Resting tremor 06/15/2015  . Scoliosis of thoracic spine 11/05/2013  . Spinal stenosis, unspecified region other than cervical 12/03/2012  . Spondylosis of unspecified site without mention of myelopathy 12/03/2012  . Parkinson disease ([place])   . Back pain   . Seasonal allergies     Rayetta Humphrey, [place] CLT [phone] 03/23/2019, 12:32 PM  [place] [address] [place], [place], [address] Phone: [phone]   Fax:  [phone]  Name: [name] MRN: [id number] Date of Birth: [birth year]

## 2019-03-25 ENCOUNTER — Ambulatory Visit (HOSPITAL_COMMUNITY): Payer: Medicare Other | Admitting: Physical Therapy

## 2019-03-25 ENCOUNTER — Other Ambulatory Visit: Payer: Self-pay

## 2019-03-25 DIAGNOSIS — R29898 Other symptoms and signs involving the musculoskeletal system: Secondary | ICD-10-CM

## 2019-03-25 DIAGNOSIS — R2681 Unsteadiness on feet: Secondary | ICD-10-CM

## 2019-03-25 DIAGNOSIS — R293 Abnormal posture: Secondary | ICD-10-CM

## 2019-03-25 DIAGNOSIS — M6281 Muscle weakness (generalized): Secondary | ICD-10-CM

## 2019-03-25 NOTE — Therapy (Signed)
[place], [place], [address] Phone: [phone]   Fax:  [phone]  Physical Therapy Treatment  Patient Details  Name: [name] MRN: [id number] Date of Birth: September 05, 1933 Referring Provider (PT): Trixie Deis    Encounter Date: 03/25/2019  PT End of Session - 03/25/19 1211    Visit Number  4    Number of Visits  15    Date for PT Re-Evaluation  04/15/19    Authorization Type  UHC Medicare    PT Start Time  1143    PT Stop Time  1221    PT Time Calculation (min)  38 min    Activity Tolerance  Patient tolerated treatment well    Behavior During Therapy  [facility] for tasks assessed/performed       Past Medical History:  Diagnosis Date  . Ankle swelling    takes torsemide prn  . Anxiety   . Arthritis   . Asthma    allergy induced  . Back pain   . Complication of anesthesia   . Depression   . GERD (gastroesophageal reflux disease)   . Hypercholesterolemia   . Hypothyroid   . Parkinson disease ([place])   . PONV (postoperative nausea and vomiting)   . Seasonal allergies     Past Surgical History:  Procedure Laterality Date  . ABDOMINAL HYSTERECTOMY    . CARPAL TUNNEL RELEASE Right 10/26/2015   Procedure: CARPAL TUNNEL RELEASE;  Surgeon: Daryll Brod, MD;  Location: [place];  Service: Orthopedics;  Laterality: Right;  . CARPOMETACARPEL SUSPENSION PLASTY Right 10/26/2015   Procedure: RIGHT HAND TRAPEZIECTOMY WITH THUMB METACARPEL SUSPENSION PLASTY;  Surgeon: Daryll Brod, MD;  Location: [place];  Service: Orthopedics;  Laterality: Right;  . CATARACT EXTRACTION W/PHACO  12/23/2011   Procedure: CATARACT EXTRACTION PHACO AND INTRAOCULAR LENS PLACEMENT (IOC);  Surgeon: Williams Che, MD;  [facility];  Service: Ophthalmology;  Laterality: Right;  CDE=5.16  . CHOLECYSTECTOMY    . EYE SURGERY     cataract extraction of left eye-[facility]-2002  . KNEE ARTHROSCOPY     right  .  NISSEN FUNDOPLICATION     [facility]  . TRIGGER FINGER RELEASE Right 10/26/2015   Procedure: RELEASE A-1 PULLEY RIGHT RING FINGER;  Surgeon: Daryll Brod, MD;  Location: [place];  Service: Orthopedics;  Laterality: Right;    There were no vitals filed for this visit.  Subjective Assessment - 03/25/19 1144    Subjective  pt states she is doing her HEP and the single leg stance continues to be the hardest one.  Pt was late for appt looking for her mask.  Reports no pain today.    Currently in Pain?  No/denies                       [facility] Adult PT Treatment/Exercise - 03/25/19 0001      Knee/Hip Exercises: Standing   Other Standing Knee Exercises  UE flexion against wall 10 reps    Other Standing Knee Exercises  hamstring curls 10 reps      Knee/Hip Exercises: Seated   Sit to Sand  2 sets;5 reps;without UE support          Balance Exercises - 03/25/19 1152      Balance Exercises: Standing   Standing Eyes Opened  Narrow base of support (BOS);5 reps    Tandem Stance  Eyes open;5 reps;Limitations  head rotations 5X, UE flexion 5X   SLS  Eyes open;5 reps;Intermittent upper extremity support   max of 5:  Rt: 5"   Marching Limitations  10    Heel Raises Limitations  15    Toe Raise Limitations  15          PT Short Term Goals - 03/18/19 1333      PT SHORT TERM GOAL #1   Title  Pt to be I in HEP to improve posture to decreae lumbar pain as well as improve overall balance    Time  3    Period  Weeks    Status  On-going    Target Date  04/06/19      PT SHORT TERM GOAL #2   Title  PT to be able to single leg stance on both LE for 10 secoonds in order to decrease pt risk of falling, ( no falls in the past week)    Time  3    Period  Weeks    Status  On-going      PT SHORT TERM GOAL #3   Title  Pt core and LE strengh to be improved 1/2 grade to allow pt to be able to come sit to stand from low lying chair without losing her balance.    Time  3     Period  Weeks    Status  On-going        PT Long Term Goals - 03/18/19 1333      PT LONG TERM GOAL #1   Title  PT balance to have improved to allow pt to single leg stance for 20" or more on both LE to allow pt to feel confident walking to her pond using a walking stick.    Time  6    Period  Weeks    Status  On-going      PT LONG TERM GOAL #2   Title  PT to have had not falls in the past 4 weeks    Time  6    Period  Weeks    Status  On-going      PT LONG TERM GOAL #3   Title  PT core and LE strength to be increased one grade to allow pt to feel confident walking for 30 mintues without an assisitve device.    Time  6    Period  Weeks    Status  On-going            Plan - 03/25/19 1212    Clinical Impression Statement  continued focus on balance and LE strengthening.  Pt tends to place all weight on back leg with tandem, loosing balance and with several LOB throughout session requiring mod -max assist of therapist to correct.  Pt required one seated rest break during session today.    Personal Factors and Comorbidities  Age;Comorbidity 2;Time since onset of injury/illness/exacerbation    Comorbidities  OA< parkinsons, HTN, Chronic neck and back pain    Examination-Activity Limitations  Bed Mobility;Bend;Carry;Dressing;Lift;Locomotion Level;Sit;Squat;Stairs;Stand    Examination-Participation Restrictions  Church;Cleaning;Community Activity;Laundry;Yard Work    Stability/Clinical Decision Making  Evolving/Moderate complexity    Rehab Potential  Good    PT Frequency  3x / week    PT Duration  3 weeks   then 2x a week for 3 weeks   PT Treatment/Interventions  ADLs/Self Care Home Management;Functional mobility training;Gait training;Therapeutic activities;Therapeutic exercise;Balance training;Neuromuscular re-education;Patient/family education;Manual techniques    PT Next Visit Plan  continue to improve posture, LE strength, balance and reduce pain.    Next session  Update  HEP    PT Home Exercise Plan  sitting tall, bridge and hip abduction    Consulted and Agree with Plan of Care  Patient       Patient will benefit from skilled therapeutic intervention in order to improve the following deficits and impairments:  Abnormal gait, Decreased activity tolerance, Decreased balance, Decreased range of motion, Decreased strength, Difficulty walking, Postural dysfunction, Pain  Visit Diagnosis: No diagnosis found.     Problem List Patient Active Problem List   Diagnosis Date Noted  . IBS (irritable bowel syndrome) 02/05/2018  . Diarrhea 06/16/2017  . Nausea without vomiting 06/16/2017  . Numbness 08/18/2015  . Polyneuropathy 08/18/2015  . Menopausal syndrome (hot flushes) 06/15/2015  . Tremor, essential 06/15/2015  . Gastroesophageal reflux disease with esophagitis 06/15/2015  . Bilateral carpal tunnel syndrome 06/15/2015  . Resting tremor 06/15/2015  . Scoliosis of thoracic spine 11/05/2013  . Spinal stenosis, unspecified region other than cervical 12/03/2012  . Spondylosis of unspecified site without mention of myelopathy 12/03/2012  . Parkinson disease ([place])   . Back pain   . Seasonal allergies    Teena Irani, PTA/CLT [phone]  Teena Irani 03/25/2019, 12:29 PM  [place] [address] [place], [place], [address] Phone: [phone]   Fax:  [phone]  Name: [name] MRN: [id number] Date of Birth: Nov 22, 1933

## 2019-03-29 ENCOUNTER — Encounter (HOSPITAL_COMMUNITY): Payer: Self-pay | Admitting: Physical Therapy

## 2019-03-29 ENCOUNTER — Other Ambulatory Visit: Payer: Self-pay

## 2019-03-29 ENCOUNTER — Ambulatory Visit (HOSPITAL_COMMUNITY): Payer: Medicare Other | Admitting: Physical Therapy

## 2019-03-29 DIAGNOSIS — M6281 Muscle weakness (generalized): Secondary | ICD-10-CM

## 2019-03-29 DIAGNOSIS — R2681 Unsteadiness on feet: Secondary | ICD-10-CM

## 2019-03-29 DIAGNOSIS — R29898 Other symptoms and signs involving the musculoskeletal system: Secondary | ICD-10-CM

## 2019-03-29 DIAGNOSIS — R293 Abnormal posture: Secondary | ICD-10-CM

## 2019-03-29 NOTE — Therapy (Signed)
[place] [address] [place], [place], [address] Phone: [phone]   Fax:  [phone]  Physical Therapy Treatment  Patient Details  Name: [name] MRN: [id number] Date of Birth: 06/22/1933 Referring Provider (PT): Trixie Deis    Encounter Date: 03/29/2019    PT End of Session - 03/29/19 1227    Visit Number  5    Number of Visits  15    Date for PT Re-Evaluation  04/15/19    Authorization Type  UHC Medicare    PT Start Time  1140    PT Stop Time  1218    PT Time Calculation (min)  38 min    Equipment Utilized During Treatment  Gait belt    Activity Tolerance  Patient tolerated treatment well    Behavior During Therapy  [facility] for tasks assessed/performed       Past Medical History:  Diagnosis Date  . Ankle swelling    takes torsemide prn  . Anxiety   . Arthritis   . Asthma    allergy induced  . Back pain   . Complication of anesthesia   . Depression   . GERD (gastroesophageal reflux disease)   . Hypercholesterolemia   . Hypothyroid   . Parkinson disease ([place])   . PONV (postoperative nausea and vomiting)   . Seasonal allergies     Past Surgical History:  Procedure Laterality Date  . ABDOMINAL HYSTERECTOMY    . CARPAL TUNNEL RELEASE Right 10/26/2015   Procedure: CARPAL TUNNEL RELEASE;  Surgeon: Daryll Brod, MD;  Location: [place];  Service: Orthopedics;  Laterality: Right;  . CARPOMETACARPEL SUSPENSION PLASTY Right 10/26/2015   Procedure: RIGHT HAND TRAPEZIECTOMY WITH THUMB METACARPEL SUSPENSION PLASTY;  Surgeon: Daryll Brod, MD;  Location: [place];  Service: Orthopedics;  Laterality: Right;  . CATARACT EXTRACTION W/PHACO  12/23/2011   Procedure: CATARACT EXTRACTION PHACO AND INTRAOCULAR LENS PLACEMENT (IOC);  Surgeon: Williams Che, MD;  [facility];  Service: Ophthalmology;  Laterality: Right;  CDE=5.16  . CHOLECYSTECTOMY    . EYE SURGERY     cataract extraction of  left eye-[facility]  . KNEE ARTHROSCOPY     right  . NISSEN FUNDOPLICATION     [facility]  . TRIGGER FINGER RELEASE Right 10/26/2015   Procedure: RELEASE A-1 PULLEY RIGHT RING FINGER;  Surgeon: Daryll Brod, MD;  Location: [place];  Service: Orthopedics;  Laterality: Right;    There were no vitals filed for this visit.  Subjective Assessment - 03/29/19 1138    Subjective  Patient says things are "about the same" and that some exercise at home is getting better. Standing on one leg is still tough. Patient does note having some lumbar pain this morning.    Currently in Pain?  Yes    Pain Score  3     Pain Location  Back    Pain Orientation  Lower;Medial    Pain Descriptors / Indicators  Aching;Dull                       [facility] Adult PT Treatment/Exercise - 03/29/19 0001      Knee/Hip Exercises: Standing   Heel Raises  --    Heel Raises Limitations  --    Hip Abduction  Both;2 sets;10 reps    Abduction Limitations  HHA x1     Other Standing Knee Exercises  hamstring curls 2 x10 reps; BLE  Knee/Hip Exercises: Seated   Sit to Sand  2 sets;5 reps;without UE support          Balance Exercises - 03/29/19 1235      Balance Exercises: Standing   Standing Eyes Opened  Narrow base of support (BOS);3 reps;20 secs;Solid surface    Standing Eyes Closed  Narrow base of support (BOS);3 reps;20 secs;Solid surface    Tandem Stance  3 reps;15 secs;Eyes open;Intermittent upper extremity support    Partial Tandem Stance  Eyes open;3 reps;5 reps;Intermittent upper extremity support   with head turns; 5 x each; both legs    Step Over Hurdles / Cones  15 feet; fwd/ lateral over 6 inch hurdles; 3 RT each; CG   LOB x 2; able to correct with therapist Min A   Marching Limitations  20 BLE    Heel Raises Limitations  x20    Toe Raise Limitations  x20    Other Standing Exercises  Step taps on 6 inch step; alternating BLE; intermittent UE assist; x 20          PT  Short Term Goals - 03/18/19 1333      PT SHORT TERM GOAL #1   Title  Pt to be I in HEP to improve posture to decreae lumbar pain as well as improve overall balance    Time  3    Period  Weeks    Status  On-going    Target Date  04/06/19      PT SHORT TERM GOAL #2   Title  PT to be able to single leg stance on both LE for 10 secoonds in order to decrease pt risk of falling, ( no falls in the past week)    Time  3    Period  Weeks    Status  On-going      PT SHORT TERM GOAL #3   Title  Pt core and LE strengh to be improved 1/2 grade to allow pt to be able to come sit to stand from low lying chair without losing her balance.    Time  3    Period  Weeks    Status  On-going        PT Long Term Goals - 03/18/19 1333      PT LONG TERM GOAL #1   Title  PT balance to have improved to allow pt to single leg stance for 20" or more on both LE to allow pt to feel confident walking to her pond using a walking stick.    Time  6    Period  Weeks    Status  On-going      PT LONG TERM GOAL #2   Title  PT to have had not falls in the past 4 weeks    Time  6    Period  Weeks    Status  On-going      PT LONG TERM GOAL #3   Title  PT core and LE strength to be increased one grade to allow pt to feel confident walking for 30 mintues without an assisitve device.    Time  6    Period  Weeks    Status  On-going            Plan - 03/29/19 1228    Clinical Impression Statement  Patient tolerated session well today. Able to progress reps with standing BLE strengthening exercise. Patient did require 2 rest breaks this session due to fatigue.  Patient showed good static balance with narrow BOS with eyes open and closed, but was challenged with tandem stance. Patient also challenged with fwd abnd lateral ambulation over 6 inch hurdles, with 2 episodes of LOB, but able to correct with therapist assist. Patient noted increased muscle fatigue in legs post treatment. Will continue to progress with  focus on BLE strengthening and dynamic balance.    Personal Factors and Comorbidities  Age;Comorbidity 2;Time since onset of injury/illness/exacerbation    Comorbidities  OA< parkinsons, HTN, Chronic neck and back pain    Examination-Activity Limitations  Bed Mobility;Bend;Carry;Dressing;Lift;Locomotion Level;Sit;Squat;Stairs;Stand    Examination-Participation Restrictions  Church;Cleaning;Community Activity;Laundry;Yard Work    Stability/Clinical Decision Making  Evolving/Moderate complexity    Rehab Potential  Good    PT Frequency  3x / week    PT Duration  3 weeks   then 2x a week for 3 weeks   PT Treatment/Interventions  ADLs/Self Care Home Management;Functional mobility training;Gait training;Therapeutic activities;Therapeutic exercise;Balance training;Neuromuscular re-education;Patient/family education;Manual techniques    PT Next Visit Plan  Continue to progress BLE strengthening and dynamic balance as tolerated.    PT Home Exercise Plan  sitting tall, bridge and hip abduction    Consulted and Agree with Plan of Care  Patient       Patient will benefit from skilled therapeutic intervention in order to improve the following deficits and impairments:  Abnormal gait, Decreased activity tolerance, Decreased balance, Decreased range of motion, Decreased strength, Difficulty walking, Postural dysfunction, Pain  Visit Diagnosis: Unsteadiness on feet  Abnormal posture  Muscle weakness (generalized)  Other symptoms and signs involving the musculoskeletal system     Problem List Patient Active Problem List   Diagnosis Date Noted  . IBS (irritable bowel syndrome) 02/05/2018  . Diarrhea 06/16/2017  . Nausea without vomiting 06/16/2017  . Numbness 08/18/2015  . Polyneuropathy 08/18/2015  . Menopausal syndrome (hot flushes) 06/15/2015  . Tremor, essential 06/15/2015  . Gastroesophageal reflux disease with esophagitis 06/15/2015  . Bilateral carpal tunnel syndrome 06/15/2015  .  Resting tremor 06/15/2015  . Scoliosis of thoracic spine 11/05/2013  . Spinal stenosis, unspecified region other than cervical 12/03/2012  . Spondylosis of unspecified site without mention of myelopathy 12/03/2012  . Parkinson disease ([place])   . Back pain   . Seasonal allergies     [name] PT DPT 03/29/2019, 12:41 PM  [place], [place], [address] Phone: [phone]   Fax:  [phone]  Name: [name] MRN: [id number] Date of Birth: [birth year]

## 2019-03-31 ENCOUNTER — Encounter (HOSPITAL_COMMUNITY): Payer: Medicare Other

## 2019-03-31 ENCOUNTER — Telehealth (HOSPITAL_COMMUNITY): Payer: Self-pay

## 2019-03-31 NOTE — Telephone Encounter (Signed)
No show.  Called and spoke to pt who thought apt was scheduled for tomorrow.  Reminded next apt scheduled for tomorrow (10/22) at 1:15.  [address] [place]; [facility] [phone]

## 2019-04-01 ENCOUNTER — Other Ambulatory Visit: Payer: Self-pay

## 2019-04-01 ENCOUNTER — Ambulatory Visit (HOSPITAL_COMMUNITY): Payer: Medicare Other | Admitting: Physical Therapy

## 2019-04-01 ENCOUNTER — Encounter (HOSPITAL_COMMUNITY): Payer: Self-pay | Admitting: Physical Therapy

## 2019-04-01 DIAGNOSIS — R2681 Unsteadiness on feet: Secondary | ICD-10-CM | POA: Diagnosis not present

## 2019-04-01 DIAGNOSIS — R29898 Other symptoms and signs involving the musculoskeletal system: Secondary | ICD-10-CM

## 2019-04-01 DIAGNOSIS — R293 Abnormal posture: Secondary | ICD-10-CM

## 2019-04-01 DIAGNOSIS — M6281 Muscle weakness (generalized): Secondary | ICD-10-CM

## 2019-04-01 NOTE — Therapy (Signed)
[place], [place], [address] Phone: [phone]   Fax:  [phone]  Physical Therapy Treatment  Patient Details  Name: [name] MRN: [id number] Date of Birth: 10/08/1933 Referring Provider (PT): Trixie Deis    Encounter Date: 04/01/2019  PT End of Session - 04/01/19 1835    Visit Number  6    Number of Visits  15    Date for PT Re-Evaluation  04/15/19    Authorization Type  [employer]HC Medicare    PT Start Time  1345    PT Stop Time  1425    PT Time Calculation (min)  40 min    Equipment Utilized During Treatment  Gait belt    Activity Tolerance  Patient tolerated treatment well;Patient limited by fatigue    Behavior During Therapy  [facility] for tasks assessed/performed       Past Medical History:  Diagnosis Date  . Ankle swelling    takes torsemide prn  . Anxiety   . Arthritis   . Asthma    allergy induced  . Back pain   . Complication of anesthesia   . Depression   . GERD (gastroesophageal reflux disease)   . Hypercholesterolemia   . Hypothyroid   . Parkinson disease ([place])   . PONV (postoperative nausea and vomiting)   . Seasonal allergies     Past Surgical History:  Procedure Laterality Date  . ABDOMINAL HYSTERECTOMY    . CARPAL TUNNEL RELEASE Right 10/26/2015   Procedure: CARPAL TUNNEL RELEASE;  Surgeon: Daryll Brod, MD;  Location: [place];  Service: Orthopedics;  Laterality: Right;  . CARPOMETACARPEL SUSPENSION PLASTY Right 10/26/2015   Procedure: RIGHT HAND TRAPEZIECTOMY WITH THUMB METACARPEL SUSPENSION PLASTY;  Surgeon: Daryll Brod, MD;  Location: [place];  Service: Orthopedics;  Laterality: Right;  . CATARACT EXTRACTION W/PHACO  12/23/2011   Procedure: CATARACT EXTRACTION PHACO AND INTRAOCULAR LENS PLACEMENT (IOC);  Surgeon: Williams Che, MD;  [facility];  Service: Ophthalmology;  Laterality: Right;  CDE=5.16  . CHOLECYSTECTOMY    . EYE SURGERY     cataract extraction of left eye-[facility]-2002  . KNEE ARTHROSCOPY     right  . NISSEN FUNDOPLICATION     [facility]  . TRIGGER FINGER RELEASE Right 10/26/2015   Procedure: RELEASE A-1 PULLEY RIGHT RING FINGER;  Surgeon: Daryll Brod, MD;  Location: [place];  Service: Orthopedics;  Laterality: Right;    There were no vitals filed for this visit.  Subjective Assessment - 04/01/19 1431    Subjective  Patient says she was lifting a tray this morning and fell forward. Patient says " I leaned forward and I kept going". Patient says she was able to stop her fall by bracing with her arms and hands. Patient denies any new pain or injury. Patient says her back continues to ache in lower lumbar.    Currently in Pain?  Yes    Pain Score  3     Pain Location  Back    Pain Orientation  Posterior    Pain Descriptors / Indicators  Aching                       [facility] Adult PT Treatment/Exercise - 04/01/19 0001      Knee/Hip Exercises: Standing   Hip Abduction  Both;15 reps    Abduction Limitations  HHA x2    Hip Extension  Both;15 reps  Extension Limitations  HHA x2    Forward Step Up  2 sets;Both;10 reps;Step Height: 6"      Knee/Hip Exercises: Seated   Sit to Sand  3 sets;5 reps;without UE support          Balance Exercises - 04/01/19 1405      Balance Exercises: Standing   Standing Eyes Opened  Foam/compliant surface;3 reps;30 secs    Tandem Stance  3 reps;15 secs;Eyes open;Intermittent upper extremity support    Partial Tandem Stance  Eyes open;3 reps;5 reps;Intermittent upper extremity support    Step Over Hurdles / Cones  15 feet; fwd/ lateral over 6 inch hurdles; 2 RT each; CG    Other Standing Exercises  Step taps on 6 inch step; alternating BLE; intermittent UE assist; x 20; Tandem stance BLE, with punch outs, 10x each        PT Education - 04/01/19 1835    Education Details  Patient educated on pacing of activity to avoid excess fatigue, and reduce risk  of falling    Person(s) Educated  Patient    Methods  Explanation    Comprehension  Verbalized understanding       PT Short Term Goals - 03/18/19 1333      PT SHORT TERM GOAL #1   Title  Pt to be I in HEP to improve posture to decreae lumbar pain as well as improve overall balance    Time  3    Period  Weeks    Status  On-going    Target Date  04/06/19      PT SHORT TERM GOAL #2   Title  PT to be able to single leg stance on both LE for 10 secoonds in order to decrease pt risk of falling, ( no falls in the past week)    Time  3    Period  Weeks    Status  On-going      PT SHORT TERM GOAL #3   Title  Pt core and LE strengh to be improved 1/2 grade to allow pt to be able to come sit to stand from low lying chair without losing her balance.    Time  3    Period  Weeks    Status  On-going        PT Long Term Goals - 03/18/19 1333      PT LONG TERM GOAL #1   Title  PT balance to have improved to allow pt to single leg stance for 20" or more on both LE to allow pt to feel confident walking to her pond using a walking stick.    Time  6    Period  Weeks    Status  On-going      PT LONG TERM GOAL #2   Title  PT to have had not falls in the past 4 weeks    Time  6    Period  Weeks    Status  On-going      PT LONG TERM GOAL #3   Title  PT core and LE strength to be increased one grade to allow pt to feel confident walking for 30 mintues without an assisitve device.    Time  6    Period  Weeks    Status  On-going            Plan - 04/01/19 1836    Clinical Impression Statement  Patient demos increased fatigue during ther ex today. Patient  was challenged with balance activity requiring Min A for correction of LOB during ambulation over hurdles, and tandem stance with punch outs. Patient noted fatigue in BLEs toward end of session, and required more frequent breaks for rest today. Patient stated that she had a busy day today, with several errands to run. Patient was  educated on pacing activity level to avoid excess fatigue, and to rest when neccessary to lower risk of falls. Patient verbalized agreement. Patient denies any injury from recent fall, and observation of arms, hands, and knees showed no visible injury. Patient performance did not appear impacted by any acute musculoskeletal injury, just increased fatigue.    Personal Factors and Comorbidities  Age;Comorbidity 2;Time since onset of injury/illness/exacerbation    Comorbidities  OA< parkinsons, HTN, Chronic neck and back pain    Examination-Activity Limitations  Bed Mobility;Bend;Carry;Dressing;Lift;Locomotion Level;Sit;Squat;Stairs;Stand    Examination-Participation Restrictions  Church;Cleaning;Community Activity;Laundry;Yard Work    Stability/Clinical Decision Making  Evolving/Moderate complexity    Rehab Potential  Good    PT Frequency  3x / week    PT Duration  3 weeks   then 2x a week for 3 weeks   PT Treatment/Interventions  ADLs/Self Care Home Management;Functional mobility training;Gait training;Therapeutic activities;Therapeutic exercise;Balance training;Neuromuscular re-education;Patient/family education;Manual techniques    PT Next Visit Plan  Continue to progress dynamic balance and BLE strengthening.    PT Home Exercise Plan  sitting tall, bridge and hip abduction    Consulted and Agree with Plan of Care  Patient       Patient will benefit from skilled therapeutic intervention in order to improve the following deficits and impairments:  Abnormal gait, Decreased activity tolerance, Decreased balance, Decreased range of motion, Decreased strength, Difficulty walking, Postural dysfunction, Pain  Visit Diagnosis: Unsteadiness on feet  Abnormal posture  Muscle weakness (generalized)  Other symptoms and signs involving the musculoskeletal system     Problem List Patient Active Problem List   Diagnosis Date Noted  . IBS (irritable bowel syndrome) 02/05/2018  . Diarrhea  06/16/2017  . Nausea without vomiting 06/16/2017  . Numbness 08/18/2015  . Polyneuropathy 08/18/2015  . Menopausal syndrome (hot flushes) 06/15/2015  . Tremor, essential 06/15/2015  . Gastroesophageal reflux disease with esophagitis 06/15/2015  . Bilateral carpal tunnel syndrome 06/15/2015  . Resting tremor 06/15/2015  . Scoliosis of thoracic spine 11/05/2013  . Spinal stenosis, unspecified region other than cervical 12/03/2012  . Spondylosis of unspecified site without mention of myelopathy 12/03/2012  . Parkinson disease ([place])   . Back pain   . Seasonal allergies     Elizbeth Squires PT DPT 04/01/2019, 6:44 PM  [facility] [address] [place], [place], [address] Phone: [phone]   Fax:  [phone]  Name: [name] MRN: [id number] Date of Birth: 1933-12-24

## 2019-04-05 ENCOUNTER — Ambulatory Visit (HOSPITAL_COMMUNITY): Payer: Medicare Other | Admitting: Physical Therapy

## 2019-04-05 ENCOUNTER — Other Ambulatory Visit: Payer: Self-pay

## 2019-04-05 DIAGNOSIS — M6281 Muscle weakness (generalized): Secondary | ICD-10-CM

## 2019-04-05 DIAGNOSIS — R293 Abnormal posture: Secondary | ICD-10-CM

## 2019-04-05 DIAGNOSIS — R2681 Unsteadiness on feet: Secondary | ICD-10-CM

## 2019-04-05 NOTE — Therapy (Signed)
[place], [place], [address] Phone: [phone]   Fax:  [phone]  Physical Therapy Treatment  Patient Details  Name: [name] MRN: [id number] Date of Birth: [birth year] Referring Provider (PT): Trixie Deis    Encounter Date: 04/05/2019  PT End of Session - 04/05/19 1219    Visit Number  7    Number of Visits  15    Date for PT Re-Evaluation  04/15/19    Authorization Type  [employer]HC Medicare    PT Start Time  1145    PT Stop Time  1215    PT Time Calculation (min)  30 min    Equipment Utilized During Treatment  Gait belt    Activity Tolerance  Patient tolerated treatment well;Patient limited by fatigue    Behavior During Therapy  [facility] for tasks assessed/performed       Past Medical History:  Diagnosis Date  . Ankle swelling    takes torsemide prn  . Anxiety   . Arthritis   . Asthma    allergy induced  . Back pain   . Complication of anesthesia   . Depression   . GERD (gastroesophageal reflux disease)   . Hypercholesterolemia   . Hypothyroid   . Parkinson disease ([place])   . PONV (postoperative nausea and vomiting)   . Seasonal allergies     Past Surgical History:  Procedure Laterality Date  . ABDOMINAL HYSTERECTOMY    . CARPAL TUNNEL RELEASE Right 10/26/2015   Procedure: CARPAL TUNNEL RELEASE;  Surgeon: Daryll Brod, MD;  Location: [place];  Service: Orthopedics;  Laterality: Right;  . CARPOMETACARPEL SUSPENSION PLASTY Right 10/26/2015   Procedure: RIGHT HAND TRAPEZIECTOMY WITH THUMB METACARPEL SUSPENSION PLASTY;  Surgeon: Daryll Brod, MD;  Location: [place];  Service: Orthopedics;  Laterality: Right;  . CATARACT EXTRACTION W/PHACO  12/23/2011   Procedure: CATARACT EXTRACTION PHACO AND INTRAOCULAR LENS PLACEMENT (IOC);  Surgeon: Williams Che, MD;  [facility];  Service: Ophthalmology;  Laterality: Right;  CDE=5.16  . CHOLECYSTECTOMY    . EYE SURGERY     cataract extraction of left eye-[facility]-2002  . KNEE ARTHROSCOPY     right  . NISSEN FUNDOPLICATION     [facility]  . TRIGGER FINGER RELEASE Right 10/26/2015   Procedure: RELEASE A-1 PULLEY RIGHT RING FINGER;  Surgeon: Daryll Brod, MD;  Location: [facility];  Service: Orthopedics;  Laterality: Right;    There were no vitals filed for this visit.  Subjective Assessment - 04/05/19 1141    Subjective  pt was late for appt and then had to use the restroom upon entering clinic. no issues or complaints other than running to alot of different appointments.  No pain currently.    Currently in Pain?  No/denies                       [facility] Adult PT Treatment/Exercise - 04/05/19 0001      Knee/Hip Exercises: Standing   Heel Raises  10 reps    Hip Abduction  Both;15 reps    Abduction Limitations  HHA x2    Hip Extension  Both;15 reps    Extension Limitations  HHA x2    Forward Step Up  2 sets;Both;10 reps;Step Height: 6"      Knee/Hip Exercises: Seated   Sit to Sand  3 sets;5 reps;without UE support  Balance Exercises - 04/05/19 1155      Balance Exercises: Standing   Tandem Stance  2 reps;25 secs;Limitations;Foam/compliant surface    Step Over Hurdles / Cones  15 feet; fwd/ lateral over 6 inch hurdles; 2 RT each; CG    Marching Limitations  20 BLE          PT Short Term Goals - 03/18/19 1333      PT SHORT TERM GOAL #1   Title  Pt to be I in HEP to improve posture to decreae lumbar pain as well as improve overall balance    Time  3    Period  Weeks    Status  On-going    Target Date  04/06/19      PT SHORT TERM GOAL #2   Title  PT to be able to single leg stance on both LE for 10 secoonds in order to decrease pt risk of falling, ( no falls in the past week)    Time  3    Period  Weeks    Status  On-going      PT SHORT TERM GOAL #3   Title  Pt core and LE strengh to be improved 1/2 grade to allow pt to be able to come sit to stand from low  lying chair without losing her balance.    Time  3    Period  Weeks    Status  On-going        PT Long Term Goals - 03/18/19 1333      PT LONG TERM GOAL #1   Title  PT balance to have improved to allow pt to single leg stance for 20" or more on both LE to allow pt to feel confident walking to her pond using a walking stick.    Time  6    Period  Weeks    Status  On-going      PT LONG TERM GOAL #2   Title  PT to have had not falls in the past 4 weeks    Time  6    Period  Weeks    Status  On-going      PT LONG TERM GOAL #3   Title  PT core and LE strength to be increased one grade to allow pt to feel confident walking for 30 mintues without an assisitve device.    Time  6    Period  Weeks    Status  On-going            Plan - 04/05/19 1219    Clinical Impression Statement  pt was late and spent the first 7 minutes in the restroom due to IBS.  Able to complete most of her session tdoay with 2 seated rest breaks needed due to fatigue.  PT with several LOB into extension and unable to self correct withoiut therapist assistance.  Once she just sat back into chair after standing due to inabiltiy to obtain balance.  pt requires cues throughout for forward gaze and posturing with activities.    Personal Factors and Comorbidities  Age;Comorbidity 2;Time since onset of injury/illness/exacerbation    Comorbidities  OA< parkinsons, HTN, Chronic neck and back pain    Examination-Activity Limitations  Bed Mobility;Bend;Carry;Dressing;Lift;Locomotion Level;Sit;Squat;Stairs;Stand    Examination-Participation Restrictions  Church;Cleaning;Community Activity;Laundry;Yard Work    Stability/Clinical Decision Making  Evolving/Moderate complexity    Rehab Potential  Good    PT Frequency  3x / week    PT Duration  3 weeks   then 2x a week for 3 weeks   PT Treatment/Interventions  ADLs/Self Care Home Management;Functional mobility training;Gait training;Therapeutic activities;Therapeutic  exercise;Balance training;Neuromuscular re-education;Patient/family education;Manual techniques    PT Next Visit Plan  Continue to progress dynamic balance and BLE strengthening.    PT Home Exercise Plan  sitting tall, bridge and hip abduction    Consulted and Agree with Plan of Care  Patient       Patient will benefit from skilled therapeutic intervention in order to improve the following deficits and impairments:  Abnormal gait, Decreased activity tolerance, Decreased balance, Decreased range of motion, Decreased strength, Difficulty walking, Postural dysfunction, Pain  Visit Diagnosis: Abnormal posture  Muscle weakness (generalized)  Unsteadiness on feet     Problem List Patient Active Problem List   Diagnosis Date Noted  . IBS (irritable bowel syndrome) 02/05/2018  . Diarrhea 06/16/2017  . Nausea without vomiting 06/16/2017  . Numbness 08/18/2015  . Polyneuropathy 08/18/2015  . Menopausal syndrome (hot flushes) 06/15/2015  . Tremor, essential 06/15/2015  . Gastroesophageal reflux disease with esophagitis 06/15/2015  . Bilateral carpal tunnel syndrome 06/15/2015  . Resting tremor 06/15/2015  . Scoliosis of thoracic spine 11/05/2013  . Spinal stenosis, unspecified region other than cervical 12/03/2012  . Spondylosis of unspecified site without mention of myelopathy 12/03/2012  . Parkinson disease ([place])   . Back pain   . Seasonal allergies    Teena Irani, PTA/CLT [phone]  Teena Irani 04/05/2019, 12:22 PM  [facility] [address] [place], [place], [address] Phone: [phone]   Fax:  [phone]  Name: PAYSLIE MCCAIG MRN: [id number] Date of Birth: 05-14-1934

## 2019-04-07 ENCOUNTER — Ambulatory Visit: Payer: Medicare Other | Admitting: Nurse Practitioner

## 2019-04-07 ENCOUNTER — Encounter: Payer: Self-pay | Admitting: Gastroenterology

## 2019-04-07 ENCOUNTER — Ambulatory Visit: Payer: Medicare Other | Admitting: Gastroenterology

## 2019-04-07 ENCOUNTER — Ambulatory Visit (HOSPITAL_COMMUNITY): Payer: Medicare Other | Admitting: Physical Therapy

## 2019-04-07 ENCOUNTER — Telehealth: Payer: Self-pay | Admitting: Gastroenterology

## 2019-04-07 ENCOUNTER — Other Ambulatory Visit: Payer: Self-pay

## 2019-04-07 VITALS — BP 129/76 | HR 92 | Temp 97.2°F | Ht 62.0 in | Wt 168.4 lb

## 2019-04-07 DIAGNOSIS — K219 Gastro-esophageal reflux disease without esophagitis: Secondary | ICD-10-CM | POA: Diagnosis not present

## 2019-04-07 DIAGNOSIS — R293 Abnormal posture: Secondary | ICD-10-CM

## 2019-04-07 DIAGNOSIS — R112 Nausea with vomiting, unspecified: Secondary | ICD-10-CM

## 2019-04-07 DIAGNOSIS — Z9889 Other specified postprocedural states: Secondary | ICD-10-CM | POA: Insufficient documentation

## 2019-04-07 DIAGNOSIS — M6281 Muscle weakness (generalized): Secondary | ICD-10-CM

## 2019-04-07 DIAGNOSIS — R2681 Unsteadiness on feet: Secondary | ICD-10-CM | POA: Diagnosis not present

## 2019-04-07 DIAGNOSIS — K580 Irritable bowel syndrome with diarrhea: Secondary | ICD-10-CM | POA: Diagnosis not present

## 2019-04-07 MED ORDER — PANTOPRAZOLE SODIUM 40 MG PO TBEC: 40.0000 mg | DELAYED_RELEASE_TABLET | Freq: Every day | ORAL | 1 refills | Status: DC

## 2019-04-07 NOTE — Progress Notes (Signed)
Primary Care Physician: Lemmie Evens, MD  Primary Gastroenterologist:  Garfield Cornea, MD   Chief Complaint  Patient presents with  . Gastroesophageal Reflux  . Irritable Bowel Syndrome    diarrhea about 3-4 times per week  . Nausea    almost daily    HPI: [name] is a 85 y.o. female here for follow-up.  She had a virtual visit back in April.  History of IBS and GERD.  Previous Nissen fundoplication at [place], 9937.  Last colonoscopy 2001 with no future screenings recommended due to age.    Lately patient has been having more nausea, mostly in the mornings. Has to take thyroid medication on empty stomach. Will be very nauseated.  Often has vomiting.  Sometimes just heaving.  Has not been able to vomit since Niesen fundoplication in the 16R up until recently.  Generally with nausea she also has the diarrhea.  Bentyl does not seem to help so she stopped taking it.  First thing in the morning after first loose stool she will take 1-2 Imodium and a capful of Pepto-Bismol.  She will have several bowel movements and eventually things to slow down.  She does fine throughout the rest the day.  No nocturnal symptoms.  Some days regular stools.  No bright red blood per rectum.  Stools are dark on Pepto.  No typical heartburn.    Current Outpatient Medications  Medication Sig Dispense Refill  . albuterol (PROVENTIL) (2.5 MG/3ML) 0.083% nebulizer solution Take 2.5 mg by nebulization every 6 (six) hours as needed for wheezing or shortness of breath.    . Biotin 10 MG CAPS Take 1 capsule by mouth daily.     Marland Kitchen bismuth subsalicylate (PEPTO BISMOL) 262 MG/15ML suspension Take 30 mLs by mouth as needed.    . carbidopa-levodopa (SINEMET IR) 25-100 MG tablet TAKE 1 TABLET BY MOUTH 4  TIMES DAILY 360 tablet 3  . cetirizine-pseudoephedrine (ZYRTEC-D) 5-120 MG per tablet Take 1 tablet by mouth daily. For sinus     . Cholecalciferol (D3-1000 PO) Take by mouth daily.    Marland Kitchen escitalopram (LEXAPRO) 5  MG tablet Take 10 mg by mouth daily.    Marland Kitchen levothyroxine (SYNTHROID, LEVOTHROID) 25 MCG tablet Take 25 mcg by mouth daily.    Marland Kitchen loperamide (IMODIUM) 2 MG capsule Take 2-4 mg by mouth daily as needed for diarrhea or loose stools.     . mometasone (NASONEX) 50 MCG/ACT nasal spray Place 2 sprays into the nose 2 (two) times daily as needed. For allergies    . montelukast (SINGULAIR) 10 MG tablet Take 10 mg by mouth at bedtime.      . Multiple Vitamins-Minerals (CAL-DAY 1000 PO) Take by mouth daily.    . naproxen (NAPROSYN) 500 MG tablet Take 500 mg by mouth 2 (two) times daily as needed.     . potassium chloride (MICRO-K) 10 MEQ CR capsule Take 20 mEq by mouth daily.     Marland Kitchen QVAR REDIHALER 80 MCG/ACT AERB INHALATION TAKE 2 PUFFS DAILY TO PREVENT BREATHING PROBLEMS.  7  . torsemide (DEMADEX) 10 MG tablet Take 10 mg by mouth daily. 1/2 to 1 tab daily     No current facility-administered medications for this visit.     Allergies as of 04/07/2019 - Review Complete 04/07/2019  Allergen Reaction Noted  . Cephalosporins Rash 10/19/2010  . Latex Rash 01/17/2011  . Lactose intolerance (gi) Nausea And Vomiting 12/09/2011  . Lactulose Nausea And Vomiting 06/15/2015  .  Cephalexin Rash 06/15/2015  . Levofloxacin Anxiety and Rash 10/08/2018  . Meperidine and related Nausea And Vomiting 10/19/2010  . Omeprazole Rash 06/15/2015    ROS:  General: Negative for anorexia, weight loss, fever, chills, fatigue, weakness. ENT: Negative for hoarseness, difficulty swallowing , nasal congestion. CV: Negative for chest pain, angina, palpitations, dyspnea on exertion, peripheral edema.  Respiratory: Negative for dyspnea at rest, dyspnea on exertion, cough, sputum, wheezing.  GI: See history of present illness. GU:  Negative for dysuria, hematuria, urinary incontinence, urinary frequency, nocturnal urination.  Endo: Negative for unusual weight change.    Physical Examination:   BP 129/76   Pulse 92   Temp (!)  97.2 F (36.2 C) (Oral)   Ht 5' 2"  (1.575 m)   Wt 168 lb 6.4 oz (76.4 kg)   BMI 30.80 kg/m   General: Well-nourished, well-developed in no acute distress.  Eyes: No icterus. Mouth: Oropharyngeal mucosa moist and pink , no lesions erythema or exudate. Lungs: Clear to auscultation bilaterally.  Heart: Regular rate and rhythm, no murmurs rubs or gallops.  Abdomen: Bowel sounds are normal, nontender, nondistended, no hepatosplenomegaly or masses, no abdominal bruits or hernia , no rebound or guarding.   Extremities: No lower extremity edema. No clubbing or deformities. Neuro: Alert and oriented x 4   Skin: Warm and dry, no jaundice.   Psych: Alert and cooperative, normal mood and affect.  Labs:  Lab Results  Component Value Date   WBC 4.3 08/14/2018   HGB 12.7 08/14/2018   HCT 40.4 08/14/2018   MCV 97.3 08/14/2018   PLT 200 08/14/2018    Imaging Studies: No results found.   Impression/plan:  Pleasant 85 year old female with history of chronic GERD, chronic IBS-D, prior Niesen fundoplication for hiatal hernia presenting for follow-up.  She has been having increased nausea, almost every morning.  Sometimes with vomiting.  This concerns her as she has never been able to vomit since her fundoplication years ago.  She is concerned that her wrap has loosened or she developed a recurrent hernia.  Currently not on antacids or PPI therapy.  Symptoms could be due to untreated reflux.  Cannot exclude hiatal hernia.  She is willing to try pantoprazole 40 mg daily before evening meal.  She had rash previously on omeprazole and has been warned to look for any type of reactions and if so stop immediately and will notify us.  We discussed options such as upper endoscopy versus barium study to evaluate hiatal hernia.  Regarding chronic diarrhea, dicyclomine not helpful so she stopped it.  Try Imodium 2 mg every morning, first thing.  Hold only if constipation.  Last year she had Salmonella, treated  with Cipro.  She has a history of IBS.  If symptoms become refractory, would consider further evaluation.  Return to the office in 4 months or call sooner if needed.

## 2019-04-07 NOTE — Patient Instructions (Addendum)
1. Please start pantoprazole once daily before evening meal. Please monitor for any allergic reactions such as rash. We had that you developed rash to omeprazole previously.  If you see any reactions please stop medication and let us know. 2. X-ray of your esophagus as planned. 3. Take Imodium 2 mg every morning to prevent diarrhea.  Hold if constipation develops. 4. Return to the office in 4 months or call sooner if needed.

## 2019-04-07 NOTE — Addendum Note (Signed)
Addended by: Mahala Menghini on: 04/07/2019 12:57 PM   Modules accepted: Orders

## 2019-04-07 NOTE — Telephone Encounter (Signed)
Patient needs an UGI series NOT a BPE for Dx: N/V, evaluate for hiatal hernia, previous Nissen fundoplication.

## 2019-04-07 NOTE — Therapy (Signed)
[place], [place], [address] Phone: [phone]   Fax:  [phone]  Physical Therapy Treatment  Patient Details  Name: [name] MRN: [id number] Date of Birth: 03/14/1934 Referring Provider (PT): Trixie Deis    Encounter Date: 04/07/2019  PT End of Session - 04/07/19 1446    Visit Number  8    Number of Visits  15    Date for PT Re-Evaluation  04/15/19    Authorization Type  UHC Medicare    PT Start Time  1410    PT Stop Time  1453    PT Time Calculation (min)  43 min    Equipment Utilized During Treatment  Gait belt    Activity Tolerance  Patient tolerated treatment well;Patient limited by fatigue    Behavior During Therapy  [facility] for tasks assessed/performed       Past Medical History:  Diagnosis Date  . Ankle swelling    takes torsemide prn  . Anxiety   . Arthritis   . Asthma    allergy induced  . Back pain   . Complication of anesthesia   . Depression   . GERD (gastroesophageal reflux disease)   . Hypercholesterolemia   . Hypothyroid   . Parkinson disease ([place])   . PONV (postoperative nausea and vomiting)   . Seasonal allergies     Past Surgical History:  Procedure Laterality Date  . ABDOMINAL HYSTERECTOMY    . CARPAL TUNNEL RELEASE Right 10/26/2015   Procedure: CARPAL TUNNEL RELEASE;  Surgeon: Daryll Brod, MD;  Location: [place];  Service: Orthopedics;  Laterality: Right;  . CARPOMETACARPEL SUSPENSION PLASTY Right 10/26/2015   Procedure: RIGHT HAND TRAPEZIECTOMY WITH THUMB METACARPEL SUSPENSION PLASTY;  Surgeon: Daryll Brod, MD;  Location: [place];  Service: Orthopedics;  Laterality: Right;  . CATARACT EXTRACTION W/PHACO  12/23/2011   Procedure: CATARACT EXTRACTION PHACO AND INTRAOCULAR LENS PLACEMENT (IOC);  Surgeon: Williams Che, MD;  [facility];  Service: Ophthalmology;  Laterality: Right;  CDE=5.16  . CHOLECYSTECTOMY    . EYE SURGERY     cataract extraction of left eye-[facility]-2002  . KNEE ARTHROSCOPY     right  . NISSEN FUNDOPLICATION     [facility]  . TRIGGER FINGER RELEASE Right 10/26/2015   Procedure: RELEASE A-1 PULLEY RIGHT RING FINGER;  Surgeon: Daryll Brod, MD;  Location: [place];  Service: Orthopedics;  Laterality: Right;    There were no vitals filed for this visit.  Subjective Assessment - 04/07/19 1413    Subjective  pt states she has a little worness in her neck back and shoulders.  Pt was 8 minutes late today.                       [place] Adult PT Treatment/Exercise - 04/07/19 0001      Knee/Hip Exercises: Standing   Hip Abduction  Both;15 reps    Abduction Limitations  HHA x2    Hip Extension  Both;15 reps    Extension Limitations  HHA x2    Lateral Step Up  10 reps;Step Height: 4";Hand Hold: 1    Forward Step Up  Both;10 reps;Step Height: 6"    Forward Step Up Limitations  with power ups with opposite LE and 1 UE assist    Step Down  Both;10 reps;Hand Hold: 1;Step Height: 4"      Knee/Hip Exercises: Seated   Sit to [employer]  15 reps;without UE support          Balance Exercises - 04/07/19 1430      Balance Exercises: Standing   Tandem Stance  Limitations   with PVC pipe 10 reps flexion with each LE lead   Tandem Gait  2 reps    Retro Gait  2 reps    Sidestepping  2 reps    Marching Limitations  20 BLE   high holds         PT Short Term Goals - 03/18/19 1333      PT SHORT TERM GOAL #1   Title  Pt to be I in HEP to improve posture to decreae lumbar pain as well as improve overall balance    Time  3    Period  Weeks    Status  On-going    Target Date  04/06/19      PT SHORT TERM GOAL #2   Title  PT to be able to single leg stance on both LE for 10 secoonds in order to decrease pt risk of falling, ( no falls in the past week)    Time  3    Period  Weeks    Status  On-going      PT SHORT TERM GOAL #3   Title  Pt core and LE strengh to be improved 1/2 grade  to allow pt to be able to come sit to stand from low lying chair without losing her balance.    Time  3    Period  Weeks    Status  On-going        PT Long Term Goals - 03/18/19 1333      PT LONG TERM GOAL #1   Title  PT balance to have improved to allow pt to single leg stance for 20" or more on both LE to allow pt to feel confident walking to her pond using a walking stick.    Time  6    Period  Weeks    Status  On-going      PT LONG TERM GOAL #2   Title  PT to have had not falls in the past 4 weeks    Time  6    Period  Weeks    Status  On-going      PT LONG TERM GOAL #3   Title  PT core and LE strength to be increased one grade to allow pt to feel confident walking for 30 mintues without an assisitve device.    Time  6    Period  Weeks    Status  On-going            Plan - 04/07/19 1450    Clinical Impression Statement  pt with improved stablity today with all activies, however seemed to be more SOB this session requiring 2 seated, 1 standing rest breaks.  Pt continued with therex established, increased challenge of tandem with added UE task.  Improved ability to complete tandem, retro and sidestepping with forward gaze rather than downward posturing.  Pt decided to skip tomorrow and return on Friday to have a rest break between her sessions.    Personal Factors and Comorbidities  Age;Comorbidity 2;Time since onset of injury/illness/exacerbation    Comorbidities  OA< parkinsons, HTN, Chronic neck and back pain    Examination-Activity Limitations  Bed Mobility;Bend;Carry;Dressing;Lift;Locomotion Level;Sit;Squat;Stairs;Stand    Examination-Participation Restrictions  Church;Cleaning;Community Activity;Laundry;Yard Work    [employer] officer  Evolving/Moderate complexity  Rehab Potential  Good    PT Frequency  3x / week    PT Duration  3 weeks   then 2x a week for 3 weeks   PT Treatment/Interventions  ADLs/Self Care Home Management;Functional mobility  training;Gait training;Therapeutic activities;Therapeutic exercise;Balance training;Neuromuscular re-education;Patient/family education;Manual techniques    PT Next Visit Plan  Continue to progress dynamic balance and BLE strengthening.    PT Home Exercise Plan  sitting tall, bridge and hip abduction    Consulted and Agree with Plan of Care  Patient       Patient will benefit from skilled therapeutic intervention in order to improve the following deficits and impairments:  Abnormal gait, Decreased activity tolerance, Decreased balance, Decreased range of motion, Decreased strength, Difficulty walking, Postural dysfunction, Pain  Visit Diagnosis: Abnormal posture  Muscle weakness (generalized)  Unsteadiness on feet     Problem List Patient Active Problem List   Diagnosis Date Noted  . GERD (gastroesophageal reflux disease)   . History of Nissen fundoplication   . IBS (irritable bowel syndrome) 02/05/2018  . Diarrhea 06/16/2017  . N&V (nausea and vomiting) 06/16/2017  . Numbness 08/18/2015  . Polyneuropathy 08/18/2015  . Menopausal syndrome (hot flushes) 06/15/2015  . Tremor, essential 06/15/2015  . Gastroesophageal reflux disease with esophagitis 06/15/2015  . Bilateral carpal tunnel syndrome 06/15/2015  . Resting tremor 06/15/2015  . Scoliosis of thoracic spine 11/05/2013  . Spinal stenosis, unspecified region other than cervical 12/03/2012  . Spondylosis of unspecified site without mention of myelopathy 12/03/2012  . Parkinson disease ([place])   . Back pain   . Seasonal allergies    Teena Irani, PTA/CLT [phone]  Teena Irani 04/07/2019, 2:59 PM  [place] [address] [place], [place], [address] Phone: [phone]   Fax:  [phone]  Name: JONISE WEIGHTMAN MRN: [id number] Date of Birth: December 02, 1933

## 2019-04-07 NOTE — Telephone Encounter (Signed)
Called Central Scheduling, appt 04/16/19 at 8:30am moved to [facility] order. Pt aware and informed her to be NPO after midnight. Verbalized understanding.

## 2019-04-08 ENCOUNTER — Ambulatory Visit (HOSPITAL_COMMUNITY): Payer: Medicare Other | Admitting: Physical Therapy

## 2019-04-08 NOTE — Progress Notes (Signed)
cc'ed to pcp °

## 2019-04-09 ENCOUNTER — Ambulatory Visit (HOSPITAL_COMMUNITY): Payer: Medicare Other

## 2019-04-09 ENCOUNTER — Other Ambulatory Visit: Payer: Self-pay

## 2019-04-09 ENCOUNTER — Encounter (HOSPITAL_COMMUNITY): Payer: Self-pay

## 2019-04-09 DIAGNOSIS — R2681 Unsteadiness on feet: Secondary | ICD-10-CM | POA: Diagnosis not present

## 2019-04-09 DIAGNOSIS — R293 Abnormal posture: Secondary | ICD-10-CM

## 2019-04-09 DIAGNOSIS — M6281 Muscle weakness (generalized): Secondary | ICD-10-CM

## 2019-04-09 NOTE — Therapy (Signed)
[facility] [address] [place], [place], [address] Phone: [phone]   Fax:  [phone]  Physical Therapy Treatment  Patient Details  Name: [name] MRN: [id number] Date of Birth: [birth year] Referring Provider (PT): Trixie Deis    Encounter Date: 04/09/2019  PT End of Session - 04/09/19 1334    Visit Number  9    Number of Visits  15    Date for PT Re-Evaluation  04/15/19    Authorization Type  UHC Medicare    PT Start Time  1330    PT Stop Time  1358    PT Time Calculation (min)  28 min    Equipment Utilized During Treatment  Gait belt    Activity Tolerance  Patient tolerated treatment well;Patient limited by fatigue    Behavior During Therapy  [facility] for tasks assessed/performed       Past Medical History:  Diagnosis Date  . Ankle swelling    takes torsemide prn  . Anxiety   . Arthritis   . Asthma    allergy induced  . Back pain   . Complication of anesthesia   . Depression   . GERD (gastroesophageal reflux disease)   . Hypercholesterolemia   . Hypothyroid   . Parkinson disease ([place])   . PONV (postoperative nausea and vomiting)   . Seasonal allergies     Past Surgical History:  Procedure Laterality Date  . ABDOMINAL HYSTERECTOMY    . CARPAL TUNNEL RELEASE Right 10/26/2015   Procedure: CARPAL TUNNEL RELEASE;  Surgeon: Daryll Brod, MD;  Location: [place];  Service: Orthopedics;  Laterality: Right;  . CARPOMETACARPEL SUSPENSION PLASTY Right 10/26/2015   Procedure: RIGHT HAND TRAPEZIECTOMY WITH THUMB METACARPEL SUSPENSION PLASTY;  Surgeon: Daryll Brod, MD;  Location: [place];  Service: Orthopedics;  Laterality: Right;  . CATARACT EXTRACTION W/PHACO  12/23/2011   Procedure: CATARACT EXTRACTION PHACO AND INTRAOCULAR LENS PLACEMENT (IOC);  Surgeon: Williams Che, MD;  [facility];  Service: Ophthalmology;  Laterality: Right;  CDE=5.16  . CHOLECYSTECTOMY    . EYE SURGERY     cataract extraction of left eye-[facility]-2002  . KNEE ARTHROSCOPY     right  . NISSEN FUNDOPLICATION     [facility]  . TRIGGER FINGER RELEASE Right 10/26/2015   Procedure: RELEASE A-1 PULLEY RIGHT RING FINGER;  Surgeon: Daryll Brod, MD;  Location: [place];  Service: Orthopedics;  Laterality: Right;    There were no vitals filed for this visit.  Subjective Assessment - 04/09/19 1333    Subjective  Pt stated she is feeling good today.  Pt was 15 minutes late for apt today.    Pertinent History  parkinson, OA, scoliosis,    Currently in Pain?  No/denies                            Balance Exercises - 04/09/19 1349      Balance Exercises: Standing   Tandem Stance  Eyes open;Foam/compliant surface;3 reps;30 secs   3rd set with dowel rod UE Flexion   Tandem Gait  2 reps    Retro Gait  2 reps    Sidestepping  2 reps    Heel Raises Limitations  20 wiht minimal HHA, cueing for posture    Toe Raise Limitations  20 wiht minimal HHA, cueing for posture.    Other Standing Exercises  UE wall slide with heel raise  and Y as able 10x           PT Short Term Goals - 03/18/19 1333      PT SHORT TERM GOAL #1   Title  Pt to be I in HEP to improve posture to decreae lumbar pain as well as improve overall balance    Time  3    Period  Weeks    Status  On-going    Target Date  04/06/19      PT SHORT TERM GOAL #2   Title  PT to be able to single leg stance on both LE for 10 secoonds in order to decrease pt risk of falling, ( no falls in the past week)    Time  3    Period  Weeks    Status  On-going      PT SHORT TERM GOAL #3   Title  Pt core and LE strengh to be improved 1/2 grade to allow pt to be able to come sit to stand from low lying chair without losing her balance.    Time  3    Period  Weeks    Status  On-going        PT Long Term Goals - 03/18/19 1333      PT LONG TERM GOAL #1   Title  PT balance to have improved to allow pt to single leg stance  for 20" or more on both LE to allow pt to feel confident walking to her pond using a walking stick.    Time  6    Period  Weeks    Status  On-going      PT LONG TERM GOAL #2   Title  PT to have had not falls in the past 4 weeks    Time  6    Period  Weeks    Status  On-going      PT LONG TERM GOAL #3   Title  PT core and LE strength to be increased one grade to allow pt to feel confident walking for 30 mintues without an assisitve device.    Time  6    Period  Weeks    Status  On-going            Plan - 04/09/19 1603    Clinical Impression Statement  Pt late for apt today.  Pt stated she felt off balance and reports a vertigo episode earlier today, no reports of falls.  Primary focus with balance this session.  Pt require constant cueing to look up and improve posture with all balance activities, min A for safety with static and dynamic activities.  Added UE wall slides, lift off to improve posture.  EOS pt limited by fatigue, no reports of pain.    Personal Factors and Comorbidities  Age;Comorbidity 2;Time since onset of injury/illness/exacerbation    Comorbidities  OA< parkinsons, HTN, Chronic neck and back pain    Examination-Activity Limitations  Bed Mobility;Bend;Carry;Dressing;Lift;Locomotion Level;Sit;Squat;Stairs;Stand    Examination-Participation Restrictions  Church;Cleaning;Community Activity;Laundry;Yard Work    [employer] officer  Evolving/Moderate complexity    Clinical Decision Making  Moderate    Rehab Potential  Good    PT Frequency  3x / week    PT Duration  3 weeks   then 2x/week for 3 more weeks   PT Treatment/Interventions  ADLs/Self Care Home Management;Functional mobility training;Gait training;Therapeutic activities;Therapeutic exercise;Balance training;Neuromuscular re-education;Patient/family education;Manual techniques    PT Next Visit Plan  10th visit progress  note next session.  Review compliance wiht HEP and add balance activities to  HEP.  Continue to progress dynamic balance and BLE strengthening.    PT Home Exercise Plan  sitting tall, bridge and hip abduction       Patient will benefit from skilled therapeutic intervention in order to improve the following deficits and impairments:  Abnormal gait, Decreased activity tolerance, Decreased balance, Decreased range of motion, Decreased strength, Difficulty walking, Postural dysfunction, Pain  Visit Diagnosis: Abnormal posture  Muscle weakness (generalized)  Unsteadiness on feet     Problem List Patient Active Problem List   Diagnosis Date Noted  . GERD (gastroesophageal reflux disease)   . History of Nissen fundoplication   . IBS (irritable bowel syndrome) 02/05/2018  . Diarrhea 06/16/2017  . N&V (nausea and vomiting) 06/16/2017  . Numbness 08/18/2015  . Polyneuropathy 08/18/2015  . Menopausal syndrome (hot flushes) 06/15/2015  . Tremor, essential 06/15/2015  . Gastroesophageal reflux disease with esophagitis 06/15/2015  . Bilateral carpal tunnel syndrome 06/15/2015  . Resting tremor 06/15/2015  . Scoliosis of thoracic spine 11/05/2013  . Spinal stenosis, unspecified region other than cervical 12/03/2012  . Spondylosis of unspecified site without mention of myelopathy 12/03/2012  . Parkinson disease ([place])   . Back pain   . Seasonal allergies    Ihor [place], [place]; [place]  Aldona Lento 04/09/2019, 4:09 PM  [place], [place], [address] Phone: [phone]   Fax:  [phone]  Name: [name] MRN: [id number] Date of Birth: 16-Jul-1933

## 2019-04-12 ENCOUNTER — Other Ambulatory Visit: Payer: Self-pay

## 2019-04-12 MED ORDER — PANTOPRAZOLE SODIUM 40 MG PO TBEC: 40.0000 mg | DELAYED_RELEASE_TABLET | Freq: Every day | ORAL | 3 refills | Status: DC

## 2019-04-13 ENCOUNTER — Other Ambulatory Visit: Payer: Self-pay

## 2019-04-13 ENCOUNTER — Ambulatory Visit (HOSPITAL_COMMUNITY): Payer: Medicare Other | Attending: Family Medicine | Admitting: Physical Therapy

## 2019-04-13 ENCOUNTER — Encounter (HOSPITAL_COMMUNITY): Payer: Self-pay | Admitting: Physical Therapy

## 2019-04-13 DIAGNOSIS — R2681 Unsteadiness on feet: Secondary | ICD-10-CM

## 2019-04-13 DIAGNOSIS — M6281 Muscle weakness (generalized): Secondary | ICD-10-CM

## 2019-04-13 DIAGNOSIS — R29898 Other symptoms and signs involving the musculoskeletal system: Secondary | ICD-10-CM | POA: Insufficient documentation

## 2019-04-13 DIAGNOSIS — R293 Abnormal posture: Secondary | ICD-10-CM

## 2019-04-13 NOTE — Therapy (Signed)
[name], [place], [address] Phone: [phone]   Fax:  [phone]  Physical Therapy Treatment  Patient Details  Name: [name] MRN: [id number] Date of Birth: 1933/12/28 Referring Provider (PT): Trixie Deis  Progress Note Reporting Period 03/16/2019  to 04/13/2019  See note below for Objective Data and Assessment of Progress/Goals.       Encounter Date: 04/13/2019  PT End of Session - 04/13/19 1408    Visit Number  10 prog note completed visit 10    Number of Visits  18    Date for PT Re-Evaluation  69/62/95 cert ends    Authorization Type  UHC Medicare    PT Start Time  2841    PT Stop Time  1410    PT Time Calculation (min)  35 min    Equipment Utilized During Treatment  Gait belt    Activity Tolerance  Patient tolerated treatment well;Patient limited by fatigue    Behavior During Therapy  [facility] for tasks assessed/performed       Past Medical History:  Diagnosis Date  . Ankle swelling    takes torsemide prn  . Anxiety   . Arthritis   . Asthma    allergy induced  . Back pain   . Complication of anesthesia   . Depression   . GERD (gastroesophageal reflux disease)   . Hypercholesterolemia   . Hypothyroid   . Parkinson disease ([place])   . PONV (postoperative nausea and vomiting)   . Seasonal allergies     Past Surgical History:  Procedure Laterality Date  . ABDOMINAL HYSTERECTOMY    . CARPAL TUNNEL RELEASE Right 10/26/2015   Procedure: CARPAL TUNNEL RELEASE;  Surgeon: Daryll Brod, MD;  Location: [place];  Service: Orthopedics;  Laterality: Right;  . CARPOMETACARPEL SUSPENSION PLASTY Right 10/26/2015   Procedure: RIGHT HAND TRAPEZIECTOMY WITH THUMB METACARPEL SUSPENSION PLASTY;  Surgeon: Daryll Brod, MD;  Location: [place];  Service: Orthopedics;  Laterality: Right;  . CATARACT EXTRACTION W/PHACO  12/23/2011   Procedure: CATARACT EXTRACTION PHACO AND  INTRAOCULAR LENS PLACEMENT (IOC);  Surgeon: Williams Che, MD;  [facility];  Service: Ophthalmology;  Laterality: Right;  CDE=5.16  . CHOLECYSTECTOMY    . EYE SURGERY     cataract extraction of left eye-[facility]  . KNEE ARTHROSCOPY     right  . NISSEN FUNDOPLICATION     [facility]  . TRIGGER FINGER RELEASE Right 10/26/2015   Procedure: RELEASE A-1 PULLEY RIGHT RING FINGER;  Surgeon: Daryll Brod, MD;  Location: [place];  Service: Orthopedics;  Laterality: Right;    There were no vitals filed for this visit.  Subjective Assessment - 04/13/19 1344    Subjective  PT is walking for about 10 minutes two to three times a day.    Pertinent History  parkinson, OA, scoliosis,    Currently in Pain?  Yes   due to OA and it is cold   Pain Score  4     Pain Location  Back    Pain Descriptors / Indicators  Aching    Pain Type  Chronic pain         [facility] PT Assessment - 04/13/19 0001      Assessment   Medical Diagnosis  Frequent falls    Referring Provider (PT)  Megan Millikin     Onset Date/Surgical Date  12/09/18    Prior Therapy  yes 3 visits  only       Home Social worker  Private residence    Home Access  Stairs to enter    Entrance Stairs-Number of Steps  3    Entrance Stairs-Rails  Can reach both      Cognition   Overall Cognitive Status  Within Functional Limits for tasks assessed      Observation/Other Assessments   Focus on Therapeutic Outcomes (FOTO)   53   was 44 adjusted     Functional Tests   Functional tests  Single leg stance;Sit to Stand      Single Leg Stance   Comments  Rt 5 sec was 1; LT 6 " was 1       Sit to Stand   Comments  5 x 13.22 was 19.58       Posture/Postural Control   Posture/Postural Control  Postural limitations    Postural Limitations  Rounded Shoulders;Forward head;Increased thoracic kyphosis;Decreased lumbar lordosis;Flexed trunk      ROM / Strength   AROM / PROM / Strength  Strength       Strength   Right Hip Flexion  5/5   was 4/5   Right Hip Extension  4-/5   was 3-/5   Right Hip ABduction  4+/5   was 3/5    Left Hip Flexion  5/5   was 4/5    Left Hip Extension  4/5   was 4-/5    Left Hip ABduction  4+/5   was 3+/5      Ambulation/Gait   Assistive device  None                   [facility] Adult PT Treatment/Exercise - 04/13/19 0001      Ambulation/Gait   Ambulation Distance (Feet)  565 Feet   3 MWT with one off balance was 454 with one off balance.      Knee/Hip Exercises: Standing   SLS  x3 B       Knee/Hip Exercises: Seated   Sit to Sand  10 reps               PT Short Term Goals - 04/13/19 1358      PT SHORT TERM GOAL #1   Title  Pt to be I in HEP to improve posture to decreae lumbar pain as well as improve overall balance    Time  3    Period  Weeks    Status  Partially Met    Target Date  04/06/19      PT SHORT TERM GOAL #2   Title  PT to be able to single leg stance on both LE for 10 secoonds in order to decrease pt risk of falling, ( no falls in the past week)    Time  3    Period  Weeks    Status  On-going      PT SHORT TERM GOAL #3   Title  Pt core and LE strengh to be improved 1/2 grade to allow pt to be able to come sit to stand from low lying chair without losing her balance.    Time  3    Period  Weeks    Status  Achieved        PT Long Term Goals - 04/13/19 1400      PT LONG TERM GOAL #1   Title  PT balance to have improved to allow pt to single leg stance for  20" or more on both LE to allow pt to feel confident walking to her pond using a walking stick.    Time  6    Period  Weeks    Status  On-going      PT LONG TERM GOAL #2   Title  PT to have had not falls in the past 4 weeks    Time  6    Period  Weeks    Status  On-going      PT LONG TERM GOAL #3   Title  PT core and LE strength to be increased one grade to allow pt to feel confident walking for 30 mintues without an assisitve device.    Time   6    Period  Weeks    Status  On-going            Plan - 04/13/19 1625    Clinical Impression Statement  PT late for treatment; 10th visit progress note completed with noted improvement in all aspects.  Pt will benefit for one more month of skilled therapy 2x a week to focus on improving patients balance.  PT agrees with plan therapy is helping her ability to tolerate activitiy and decreasing her falling.    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    PT Home Exercise Plan  sitting tall, bridge and hip abduction       Patient will benefit from skilled therapeutic intervention in order to improve the following deficits and impairments:  Abnormal gait, Decreased activity tolerance, Decreased balance, Decreased range of motion, Decreased strength, Difficulty walking, Postural dysfunction, Pain  Visit Diagnosis: Abnormal posture  Muscle weakness (generalized)  Unsteadiness on feet     Problem List Patient Active Problem List   Diagnosis Date Noted  . GERD (gastroesophageal reflux disease)   . History of Nissen fundoplication   . IBS (irritable bowel syndrome)  02/05/2018  . Diarrhea 06/16/2017  . N&V (nausea and vomiting) 06/16/2017  . Numbness 08/18/2015  . Polyneuropathy 08/18/2015  . Menopausal syndrome (hot flushes) 06/15/2015  . Tremor, essential 06/15/2015  . Gastroesophageal reflux disease with esophagitis 06/15/2015  . Bilateral carpal tunnel syndrome 06/15/2015  . Resting tremor 06/15/2015  . Scoliosis of thoracic spine 11/05/2013  . Spinal stenosis, unspecified region other than cervical 12/03/2012  . Spondylosis of unspecified site without mention of myelopathy 12/03/2012  . Parkinson disease ([place])   . Back pain   . Seasonal allergies     Rayetta Humphrey, [place] CLT [phone] 04/13/2019, 4:32 PM  [facility] [address] [place], [place], [address] Phone: [phone]   Fax:  [phone]  Name: IZZIE GEERS MRN: [id number] Date of Birth: [birth year]

## 2019-04-15 ENCOUNTER — Encounter (HOSPITAL_COMMUNITY): Payer: Self-pay

## 2019-04-15 ENCOUNTER — Other Ambulatory Visit: Payer: Self-pay

## 2019-04-15 ENCOUNTER — Ambulatory Visit (HOSPITAL_COMMUNITY): Payer: Medicare Other

## 2019-04-15 DIAGNOSIS — M6281 Muscle weakness (generalized): Secondary | ICD-10-CM

## 2019-04-15 DIAGNOSIS — R293 Abnormal posture: Secondary | ICD-10-CM | POA: Diagnosis not present

## 2019-04-15 DIAGNOSIS — R2681 Unsteadiness on feet: Secondary | ICD-10-CM

## 2019-04-15 NOTE — Therapy (Signed)
[place], [place], [address] Phone: [phone]   Fax:  [phone]  Physical Therapy Treatment  Patient Details  Name: [name] MRN: [id number] Date of Birth: 08/15/1933 Referring Provider (PT): Trixie Deis    Encounter Date: 04/15/2019  PT End of Session - 04/15/19 1404    Visit Number  11    Number of Visits  18    Date for PT Re-Evaluation  04/27/19   prog note completed on visit [place] [employer]    Authorization Time Period  10/06-->04/27/19    Authorization - Visit Number  1    Authorization - Number of Visits  10    PT Start Time  1331    PT Stop Time  1358    PT Time Calculation (min)  27 min    Equipment Utilized During Treatment  Gait belt    Activity Tolerance  Patient tolerated treatment well;Patient limited by fatigue    Behavior During Therapy  [facility] for tasks assessed/performed       Past Medical History:  Diagnosis Date  . Ankle swelling    takes torsemide prn  . Anxiety   . Arthritis   . Asthma    allergy induced  . Back pain   . Complication of anesthesia   . Depression   . GERD (gastroesophageal reflux disease)   . Hypercholesterolemia   . Hypothyroid   . Parkinson disease ([place])   . PONV (postoperative nausea and vomiting)   . Seasonal allergies     Past Surgical History:  Procedure Laterality Date  . ABDOMINAL HYSTERECTOMY    . CARPAL TUNNEL RELEASE Right 10/26/2015   Procedure: CARPAL TUNNEL RELEASE;  Surgeon: Daryll Brod, MD;  Location: [place];  Service: Orthopedics;  Laterality: Right;  . CARPOMETACARPEL SUSPENSION PLASTY Right 10/26/2015   Procedure: RIGHT HAND TRAPEZIECTOMY WITH THUMB METACARPEL SUSPENSION PLASTY;  Surgeon: Daryll Brod, MD;  Location: [place];  Service: Orthopedics;  Laterality: Right;  . CATARACT EXTRACTION W/PHACO  12/23/2011   Procedure: CATARACT EXTRACTION PHACO AND INTRAOCULAR LENS  PLACEMENT (IOC);  Surgeon: Williams Che, MD;  [facility]: [facility];  Service: Ophthalmology;  Laterality: Right;  CDE=5.16  . CHOLECYSTECTOMY    . EYE SURGERY     cataract extraction of left eye-[facility]-2002  . KNEE ARTHROSCOPY     right  . NISSEN FUNDOPLICATION     [facility]  . TRIGGER FINGER RELEASE Right 10/26/2015   Procedure: RELEASE A-1 PULLEY RIGHT RING FINGER;  Surgeon: Daryll Brod, MD;  Location: [place];  Service: Orthopedics;  Laterality: Right;    There were no vitals filed for this visit.  Subjective Assessment - 04/15/19 1331    Subjective  Pt 15 minutes late for apt.  Stated she has some pain in lower back today, pain scale 4/10.    Pertinent History  parkinson, OA, scoliosis,    Currently in Pain?  Yes    Pain Score  4     Pain Location  Back    Pain Orientation  Lower    Pain Descriptors / Indicators  Aching                       [facility] Adult PT Treatment/Exercise - 04/15/19 0001      Posture/Postural Control   Posture/Postural Control  Postural limitations    Postural Limitations  Rounded Shoulders;Forward head;Increased thoracic  kyphosis;Decreased lumbar lordosis;Flexed trunk      Knee/Hip Exercises: Standing   Functional Squat  10 reps    Functional Squat Limitations  front of chair for mechanics    SLS  x3 B     Other Standing Knee Exercises  UE flexion against wall 10 reps          Balance Exercises - 04/15/19 1340      Balance Exercises: Standing   Tandem Stance  Eyes open;Foam/compliant surface   UE flexion wiht dowel rod 2 sets x 10 reps each   Tandem Gait  2 reps    Retro Gait  2 reps    Sidestepping  2 reps    Other Standing Exercises  UE wall slide with heel raise and Y as able 10x           PT Short Term Goals - 04/13/19 1358      PT SHORT TERM GOAL #1   Title  Pt to be I in HEP to improve posture to decreae lumbar pain as well as improve overall balance    Time  3    Period  Weeks    Status  Partially  Met    Target Date  04/06/19      PT SHORT TERM GOAL #2   Title  PT to be able to single leg stance on both LE for 10 secoonds in order to decrease pt risk of falling, ( no falls in the past week)    Time  3    Period  Weeks    Status  On-going      PT SHORT TERM GOAL #3   Title  Pt core and LE strengh to be improved 1/2 grade to allow pt to be able to come sit to stand from low lying chair without losing her balance.    Time  3    Period  Weeks    Status  Achieved        PT Long Term Goals - 04/13/19 1400      PT LONG TERM GOAL #1   Title  PT balance to have improved to allow pt to single leg stance for 20" or more on both LE to allow pt to feel confident walking to her pond using a walking stick.    Time  6    Period  Weeks    Status  On-going      PT LONG TERM GOAL #2   Title  PT to have had not falls in the past 4 weeks    Time  6    Period  Weeks    Status  On-going      PT LONG TERM GOAL #3   Title  PT core and LE strength to be increased one grade to allow pt to feel confident walking for 30 mintues without an assisitve device.    Time  6    Period  Weeks    Status  On-going            Plan - 04/15/19 1502    Clinical Impression Statement  Pt 15 minutes late for apt.  Reviewed apt dates and times and encouraged pt to arrive on time, verbalized understanding.  Session focus on balance training.  Pt c/o lower back pain and presents with forward rolled shoulders and forward head.  Educated pt with importance of posture to assist with balance and back pain control.  MIn A required with retro gait as  pt has tendency to lean back with lumbar extension not LE, explained importance of posture to assist with balance.  EOS pt reports pain reduced.    Personal Factors and Comorbidities  Age;Comorbidity 2;Time since onset of injury/illness/exacerbation    Comorbidities  OA< parkinsons, HTN, Chronic neck and back pain    Examination-Activity Limitations  Bed  Mobility;Bend;Carry;Dressing;Lift;Locomotion Level;Sit;Squat;Stairs;Stand    Examination-Participation Restrictions  Church;Cleaning;Community Activity;Laundry;Yard Work    [employer] officer  Evolving/Moderate complexity    Clinical Decision Making  Moderate    Rehab Potential  Good    PT Frequency  3x / week    PT Duration  3 weeks   then 2x/week for 3 weeks   PT Treatment/Interventions  ADLs/Self Care Home Management;Functional mobility training;Gait training;Therapeutic activities;Therapeutic exercise;Balance training;Neuromuscular re-education;Patient/family education;Manual techniques    PT Next Visit Plan  Continue to progress dynamic balance and BLE strengthening.    PT Home Exercise Plan  sitting tall, bridge and hip abduction       Patient will benefit from skilled therapeutic intervention in order to improve the following deficits and impairments:  Abnormal gait, Decreased activity tolerance, Decreased balance, Decreased range of motion, Decreased strength, Difficulty walking, Postural dysfunction, Pain  Visit Diagnosis: Abnormal posture  Muscle weakness (generalized)  Unsteadiness on feet     Problem List Patient Active Problem List   Diagnosis Date Noted  . GERD (gastroesophageal reflux disease)   . History of Nissen fundoplication   . IBS (irritable bowel syndrome) 02/05/2018  . Diarrhea 06/16/2017  . N&V (nausea and vomiting) 06/16/2017  . Numbness 08/18/2015  . Polyneuropathy 08/18/2015  . Menopausal syndrome (hot flushes) 06/15/2015  . Tremor, essential 06/15/2015  . Gastroesophageal reflux disease with esophagitis 06/15/2015  . Bilateral carpal tunnel syndrome 06/15/2015  . Resting tremor 06/15/2015  . Scoliosis of thoracic spine 11/05/2013  . Spinal stenosis, unspecified region other than cervical 12/03/2012  . Spondylosis of unspecified site without mention of myelopathy 12/03/2012  . Parkinson disease ([place])   . Back pain   . Seasonal  allergies    [place], [place]; [place]  Aldona Lento 04/15/2019, 3:20 PM  [place], [place], [address] Phone: [phone]   Fax:  [phone]  Name: [name] MRN: [id number] Date of Birth: [birth year]

## 2019-04-16 ENCOUNTER — Ambulatory Visit (HOSPITAL_COMMUNITY): Payer: Medicare Other

## 2019-04-16 ENCOUNTER — Ambulatory Visit (HOSPITAL_COMMUNITY)
Admission: RE | Admit: 2019-04-16 | Discharge: 2019-04-16 | Disposition: A | Discharge status: Home / Self Care | Payer: Medicare Other | Source: Ambulatory Visit | Attending: Gastroenterology | Admitting: Gastroenterology

## 2019-04-16 DIAGNOSIS — R112 Nausea with vomiting, unspecified: Secondary | ICD-10-CM | POA: Diagnosis present

## 2019-04-16 DIAGNOSIS — Z9889 Other specified postprocedural states: Secondary | ICD-10-CM | POA: Diagnosis present

## 2019-04-20 ENCOUNTER — Other Ambulatory Visit: Payer: Self-pay

## 2019-04-20 ENCOUNTER — Encounter (HOSPITAL_COMMUNITY): Payer: Self-pay

## 2019-04-20 ENCOUNTER — Ambulatory Visit (HOSPITAL_COMMUNITY): Payer: Medicare Other

## 2019-04-20 DIAGNOSIS — R293 Abnormal posture: Secondary | ICD-10-CM

## 2019-04-20 DIAGNOSIS — M6281 Muscle weakness (generalized): Secondary | ICD-10-CM

## 2019-04-20 DIAGNOSIS — R2681 Unsteadiness on feet: Secondary | ICD-10-CM

## 2019-04-20 NOTE — Therapy (Signed)
[place], [place], [address] Phone: [phone]   Fax:  [phone]  Physical Therapy Treatment  Patient Details  Name: [name] MRN: [id number] Date of Birth: Sep 21, 1933 Referring Provider (PT): Trixie Deis    Encounter Date: 04/20/2019  PT End of Session - 04/20/19 1221    Visit Number  12    Number of Visits  18    Date for PT Re-Evaluation  04/27/19   Progress note complete visit #10 on 04/13/19   Authorization Type  [employer] Medicare    Authorization Time Period  10/06-->04/27/19    Authorization - Visit Number  2    Authorization - Number of Visits  10    PT Start Time  1134    PT Stop Time  1215    PT Time Calculation (min)  41 min    Equipment Utilized During Treatment  Gait belt    Activity Tolerance  Patient tolerated treatment well;Patient limited by fatigue    Behavior During Therapy  [facility] for tasks assessed/performed       Past Medical History:  Diagnosis Date  . Ankle swelling    takes torsemide prn  . Anxiety   . Arthritis   . Asthma    allergy induced  . Back pain   . Complication of anesthesia   . Depression   . GERD (gastroesophageal reflux disease)   . Hypercholesterolemia   . Hypothyroid   . Parkinson disease ([place])   . PONV (postoperative nausea and vomiting)   . Seasonal allergies     Past Surgical History:  Procedure Laterality Date  . ABDOMINAL HYSTERECTOMY    . CARPAL TUNNEL RELEASE Right 10/26/2015   Procedure: CARPAL TUNNEL RELEASE;  Surgeon: Daryll Brod, MD;  Location: [place];  Service: Orthopedics;  Laterality: Right;  . CARPOMETACARPEL SUSPENSION PLASTY Right 10/26/2015   Procedure: RIGHT HAND TRAPEZIECTOMY WITH THUMB METACARPEL SUSPENSION PLASTY;  Surgeon: Daryll Brod, MD;  Location: [place];  Service: Orthopedics;  Laterality: Right;  . CATARACT EXTRACTION W/PHACO  12/23/2011   Procedure: CATARACT EXTRACTION PHACO AND  INTRAOCULAR LENS PLACEMENT (IOC);  Surgeon: Williams Che, MD;  [facility];  Service: Ophthalmology;  Laterality: Right;  CDE=5.16  . CHOLECYSTECTOMY    . EYE SURGERY     cataract extraction of left eye-[facility]-2002  . KNEE ARTHROSCOPY     right  . NISSEN FUNDOPLICATION     [facility]  . TRIGGER FINGER RELEASE Right 10/26/2015   Procedure: RELEASE A-1 PULLEY RIGHT RING FINGER;  Surgeon: Daryll Brod, MD;  Location: [place] [facility];  Service: Orthopedics;  Laterality: Right;    There were no vitals filed for this visit.  Subjective Assessment - 04/20/19 1132    Subjective  Pt reports she has decreased energy and increased effort required to complete tasks at home.  Reoprts some upper and lower back pain, pain scale 3/10.    Pertinent History  parkinson, OA, scoliosis,    Currently in Pain?  Yes    Pain Score  3     Pain Location  Back    Pain Orientation  Upper;Lower    Pain Descriptors / Indicators  Aching    Pain Type  Chronic pain    Pain Onset  More than a month ago    Pain Frequency  Constant    Aggravating Factors   activity    Pain Relieving Factors  rest  Effect of Pain on Daily Activities  limits                       [facility] Adult PT Treatment/Exercise - 04/20/19 0001      Posture/Postural Control   Posture/Postural Control  Postural limitations    Postural Limitations  Rounded Shoulders;Forward head;Increased thoracic kyphosis;Decreased lumbar lordosis;Flexed trunk      Knee/Hip Exercises: Standing   Functional Squat  10 reps    Functional Squat Limitations  front of chair for mechanics    SLS  x3 B Rt 9", Lt 8" max    Gait Training  DGI activities x 452 ft    Other Standing Knee Exercises  UE flexion against wall 10 reps      Knee/Hip Exercises: Seated   Other Seated Knee/Hip Exercises  sit as tall as possible x 10    Other Seated Knee/Hip Exercises  wback 10x          Balance Exercises - 04/20/19 1153      Balance Exercises:  Standing   Tandem Stance  Eyes open;Foam/compliant surface   UE flexion, dowel rod   SLS  3 reps    Tandem Gait  2 reps    Retro Gait  2 reps    Sidestepping  2 reps   GTB   Sit to Stand Time  10    Other Standing Exercises  DGI activities x 451f          PT Short Term Goals - 04/13/19 1358      PT SHORT TERM GOAL #1   Title  Pt to be I in HEP to improve posture to decreae lumbar pain as well as improve overall balance    Time  3    Period  Weeks    Status  Partially Met    Target Date  04/06/19      PT SHORT TERM GOAL #2   Title  PT to be able to single leg stance on both LE for 10 secoonds in order to decrease pt risk of falling, ( no falls in the past week)    Time  3    Period  Weeks    Status  On-going      PT SHORT TERM GOAL #3   Title  Pt core and LE strengh to be improved 1/2 grade to allow pt to be able to come sit to stand from low lying chair without losing her balance.    Time  3    Period  Weeks    Status  Achieved        PT Long Term Goals - 04/13/19 1400      PT LONG TERM GOAL #1   Title  PT balance to have improved to allow pt to single leg stance for 20" or more on both LE to allow pt to feel confident walking to her pond using a walking stick.    Time  6    Period  Weeks    Status  On-going      PT LONG TERM GOAL #2   Title  PT to have had not falls in the past 4 weeks    Time  6    Period  Weeks    Status  On-going      PT LONG TERM GOAL #3   Title  PT core and LE strength to be increased one grade to allow pt to feel confident walking for  30 mintues without an assisitve device.    Time  6    Period  Weeks    Status  On-going            Plan - 04/20/19 1233    Clinical Impression Statement  Continued session focus with postural strengthening and balance training.  Added wback and DGI activities for postural strengthenig and balance related gait activities with min A, main cueing to look up.  Pt wiht increased fatigue today,  required multiple seated/standing rest breaks through session. Reports pain reduced at EOS.    Personal Factors and Comorbidities  Age;Comorbidity 2;Time since onset of injury/illness/exacerbation    Comorbidities  OA< parkinsons, HTN, Chronic neck and back pain    Examination-Activity Limitations  Bed Mobility;Bend;Carry;Dressing;Lift;Locomotion Level;Sit;Squat;Stairs;Stand    Examination-Participation Restrictions  Church;Cleaning;Community Activity;Laundry;Yard Work    [employer] officer  Evolving/Moderate complexity    Clinical Decision Making  Moderate    Rehab Potential  Good    PT Duration  3 weeks   Then 2x/week for 3 weeks   PT Treatment/Interventions  ADLs/Self Care Home Management;Functional mobility training;Gait training;Therapeutic activities;Therapeutic exercise;Balance training;Neuromuscular re-education;Patient/family education;Manual techniques    PT Next Visit Plan  Continue to progress dynamic balance and BLE strengthening.  Add warrior poses next session    PT Home Exercise Plan  sitting tall, bridge and hip abduction       Patient will benefit from skilled therapeutic intervention in order to improve the following deficits and impairments:  Abnormal gait, Decreased activity tolerance, Decreased balance, Decreased range of motion, Decreased strength, Difficulty walking, Postural dysfunction, Pain  Visit Diagnosis: Abnormal posture  Muscle weakness (generalized)  Unsteadiness on feet     Problem List Patient Active Problem List   Diagnosis Date Noted  . GERD (gastroesophageal reflux disease)   . History of Nissen fundoplication   . IBS (irritable bowel syndrome) 02/05/2018  . Diarrhea 06/16/2017  . N&V (nausea and vomiting) 06/16/2017  . Numbness 08/18/2015  . Polyneuropathy 08/18/2015  . Menopausal syndrome (hot flushes) 06/15/2015  . Tremor, essential 06/15/2015  . Gastroesophageal reflux disease with esophagitis 06/15/2015  . Bilateral  carpal tunnel syndrome 06/15/2015  . Resting tremor 06/15/2015  . Scoliosis of thoracic spine 11/05/2013  . Spinal stenosis, unspecified region other than cervical 12/03/2012  . Spondylosis of unspecified site without mention of myelopathy 12/03/2012  . Parkinson disease ([place])   . Back pain   . Seasonal allergies    Ihor [place], [place]; [place]  Aldona Lento 04/20/2019, 12:38 PM  [place], [place], [address] Phone: [phone]   Fax:  [phone]  Name: [name] MRN: [id number] Date of Birth: 07/04/1933

## 2019-04-22 ENCOUNTER — Encounter (HOSPITAL_COMMUNITY): Payer: Medicare Other | Admitting: Physical Therapy

## 2019-04-23 ENCOUNTER — Encounter (HOSPITAL_COMMUNITY): Payer: Self-pay | Admitting: Physical Therapy

## 2019-04-23 ENCOUNTER — Other Ambulatory Visit: Payer: Self-pay

## 2019-04-23 ENCOUNTER — Telehealth: Payer: Self-pay | Admitting: *Deleted

## 2019-04-23 ENCOUNTER — Ambulatory Visit (HOSPITAL_COMMUNITY): Payer: Medicare Other | Admitting: Physical Therapy

## 2019-04-23 DIAGNOSIS — R293 Abnormal posture: Secondary | ICD-10-CM | POA: Diagnosis not present

## 2019-04-23 DIAGNOSIS — M6281 Muscle weakness (generalized): Secondary | ICD-10-CM

## 2019-04-23 DIAGNOSIS — R2681 Unsteadiness on feet: Secondary | ICD-10-CM

## 2019-04-23 NOTE — Telephone Encounter (Signed)
Pt says she is returning a call to [place].  [phone]

## 2019-04-23 NOTE — Therapy (Signed)
[facility] [address] [place], [place], [address] Phone: [phone]   Fax:  [phone]  Physical Therapy Treatment  Patient Details  Name: [name] MRN: [id number] Date of Birth: [birth year] Referring Provider (PT): Trixie Deis    Encounter Date: 04/23/2019  PT End of Session - 04/23/19 1452    Visit Number  13    Number of Visits  18    Date for PT Re-Evaluation  04/27/19   Progress note complete visit #10 on 04/13/19   Authorization Type  [employer] Medicare    Authorization Time Period  10/06-->04/27/19    Authorization - Visit Number  3    Authorization - Number of Visits  10    PT Start Time  1446   patient arrived late   PT Stop Time  1516    PT Time Calculation (min)  30 min    Equipment Utilized During Treatment  Gait belt    Activity Tolerance  Patient tolerated treatment well;Patient limited by fatigue    Behavior During Therapy  [facility] for tasks assessed/performed       Past Medical History:  Diagnosis Date  . Ankle swelling    takes torsemide prn  . Anxiety   . Arthritis   . Asthma    allergy induced  . Back pain   . Complication of anesthesia   . Depression   . GERD (gastroesophageal reflux disease)   . Hypercholesterolemia   . Hypothyroid   . Parkinson disease ([place])   . PONV (postoperative nausea and vomiting)   . Seasonal allergies     Past Surgical History:  Procedure Laterality Date  . ABDOMINAL HYSTERECTOMY    . CARPAL TUNNEL RELEASE Right 10/26/2015   Procedure: CARPAL TUNNEL RELEASE;  Surgeon: Daryll Brod, MD;  Location: [place];  Service: Orthopedics;  Laterality: Right;  . CARPOMETACARPEL SUSPENSION PLASTY Right 10/26/2015   Procedure: RIGHT HAND TRAPEZIECTOMY WITH THUMB METACARPEL SUSPENSION PLASTY;  Surgeon: Daryll Brod, MD;  Location: [place];  Service: Orthopedics;  Laterality: Right;  . CATARACT EXTRACTION W/PHACO  12/23/2011   Procedure: CATARACT  EXTRACTION PHACO AND INTRAOCULAR LENS PLACEMENT (IOC);  Surgeon: Williams Che, MD;  [facility];  Service: Ophthalmology;  Laterality: Right;  CDE=5.16  . CHOLECYSTECTOMY    . EYE SURGERY     cataract extraction of left eye-[facility]-2002  . KNEE ARTHROSCOPY     right  . NISSEN FUNDOPLICATION     [facility]  . TRIGGER FINGER RELEASE Right 10/26/2015   Procedure: RELEASE A-1 PULLEY RIGHT RING FINGER;  Surgeon: Daryll Brod, MD;  Location: [place];  Service: Orthopedics;  Laterality: Right;    There were no vitals filed for this visit.  Subjective Assessment - 04/23/19 1447    Subjective  Patient reports that she thinks her sense of balance has improved. She feels that she has been more careful with how she does things. She thinks she managing better with her equilibrium. Her back is feeling better. Patient continues to have difficulty with posture.    Pertinent History  parkinson, OA, scoliosis,    Currently in Pain?  Yes    Pain Score  3     Pain Location  Back    Pain Onset  More than a month ago                       [facility] Adult PT Treatment/Exercise - 04/23/19  0001      Balance Poses: Yoga   Warrior I  3 reps;15 seconds    Warrior II  3 reps;15 seconds      Knee/Hip Exercises: Standing   Forward Step Up  Right;2 sets;10 reps;Hand Hold: 1    Forward Step Up Limitations  with power ups with opposite LE and 1 UE assist      Knee/Hip Exercises: Seated   Sit to Sand  2 sets;10 reps;without UE support          Balance Exercises - 04/23/19 1527      Balance Exercises: Standing   Tandem Stance  Eyes open;30 secs;Intermittent upper extremity support;3 reps    SLS  3 reps    Tandem Gait  4 reps   15 feet       PT Education - 04/23/19 1535    Education Details  patient educated on continueing with HEP    Person(s) Educated  Patient    Methods  Explanation    Comprehension  Verbalized understanding       PT Short Term Goals - 04/13/19  1358      PT SHORT TERM GOAL #1   Title  Pt to be I in HEP to improve posture to decreae lumbar pain as well as improve overall balance    Time  3    Period  Weeks    Status  Partially Met    Target Date  04/06/19      PT SHORT TERM GOAL #2   Title  PT to be able to single leg stance on both LE for 10 secoonds in order to decrease pt risk of falling, ( no falls in the past week)    Time  3    Period  Weeks    Status  On-going      PT SHORT TERM GOAL #3   Title  Pt core and LE strengh to be improved 1/2 grade to allow pt to be able to come sit to stand from low lying chair without losing her balance.    Time  3    Period  Weeks    Status  Achieved        PT Long Term Goals - 04/13/19 1400      PT LONG TERM GOAL #1   Title  PT balance to have improved to allow pt to single leg stance for 20" or more on both LE to allow pt to feel confident walking to her pond using a walking stick.    Time  6    Period  Weeks    Status  On-going      PT LONG TERM GOAL #2   Title  PT to have had not falls in the past 4 weeks    Time  6    Period  Weeks    Status  On-going      PT LONG TERM GOAL #3   Title  PT core and LE strength to be increased one grade to allow pt to feel confident walking for 30 mintues without an assisitve device.    Time  6    Period  Weeks    Status  On-going            Plan - 04/23/19 1504    Clinical Impression Statement  Patient able to complete sit to stand and step up exercise with good form and biomechanics but requires frequent rest breaks secondary to fatigue. She demonstrates impaired   static balance with SLS but she is able to complete tandem stance with decreased sway. She demonstrates impaired dynamic balance with tandem gait and requires min assist and verbal cueing to sequence LE appropriately. She has difficulty attaining yoga poses secondary to impaired mobility overall, but is able to maintain balance in the position that she is able to  maintain. Patient arrives 15 minutes late. Patient will continue to benefit from physical therapy in order to improve function and mobility.    Personal Factors and Comorbidities  Age;Comorbidity 2;Time since onset of injury/illness/exacerbation    Comorbidities  OA< parkinsons, HTN, Chronic neck and back pain    Examination-Activity Limitations  Bed Mobility;Bend;Carry;Dressing;Lift;Locomotion Level;Sit;Squat;Stairs;Stand    Examination-Participation Restrictions  Church;Cleaning;Community Activity;Laundry;Yard Work    Stability/Clinical Decision Making  Evolving/Moderate complexity    Rehab Potential  Good    PT Duration  3 weeks   Then 2x/week for 3 weeks   PT Treatment/Interventions  ADLs/Self Care Home Management;Functional mobility training;Gait training;Therapeutic activities;Therapeutic exercise;Balance training;Neuromuscular re-education;Patient/family education;Manual techniques    PT Next Visit Plan  Continue to progress dynamic balance and BLE strengthening.  Add warrior poses next session    PT Home Exercise Plan  sitting tall, bridge and hip abduction       Patient will benefit from skilled therapeutic intervention in order to improve the following deficits and impairments:  Abnormal gait, Decreased activity tolerance, Decreased balance, Decreased range of motion, Decreased strength, Difficulty walking, Postural dysfunction, Pain  Visit Diagnosis: Abnormal posture  Muscle weakness (generalized)  Unsteadiness on feet     Problem List Patient Active Problem List   Diagnosis Date Noted  . GERD (gastroesophageal reflux disease)   . History of Nissen fundoplication   . IBS (irritable bowel syndrome) 02/05/2018  . Diarrhea 06/16/2017  . N&V (nausea and vomiting) 06/16/2017  . Numbness 08/18/2015  . Polyneuropathy 08/18/2015  . Menopausal syndrome (hot flushes) 06/15/2015  . Tremor, essential 06/15/2015  . Gastroesophageal reflux disease with esophagitis 06/15/2015  .  Bilateral carpal tunnel syndrome 06/15/2015  . Resting tremor 06/15/2015  . Scoliosis of thoracic spine 11/05/2013  . Spinal stenosis, unspecified region other than cervical 12/03/2012  . Spondylosis of unspecified site without mention of myelopathy 12/03/2012  . Parkinson disease ([facility])   . Back pain   . Seasonal allergies     3:38 PM, 04/23/19  S.  PT, DPT Physical Therapist at [facility]   [facility] [address] [place], [place], [address] Phone: [phone]   Fax:  [phone]  Name: [name] MRN: [id number] Date of Birth: 04/10/1934   

## 2019-04-26 ENCOUNTER — Other Ambulatory Visit: Payer: Self-pay

## 2019-04-26 ENCOUNTER — Ambulatory Visit (HOSPITAL_COMMUNITY): Payer: Medicare Other | Admitting: Physical Therapy

## 2019-04-26 DIAGNOSIS — R2681 Unsteadiness on feet: Secondary | ICD-10-CM

## 2019-04-26 DIAGNOSIS — R293 Abnormal posture: Secondary | ICD-10-CM

## 2019-04-26 DIAGNOSIS — M6281 Muscle weakness (generalized): Secondary | ICD-10-CM

## 2019-04-26 NOTE — Telephone Encounter (Signed)
Pt was given results. See imaging result note.

## 2019-04-26 NOTE — Therapy (Addendum)
[place], [place], [address] Phone: [phone]   Fax:  [phone]  Physical Therapy Treatment  Patient Details  Name: [name] MRN: [id number] Date of Birth: 01-Jun-1934 Referring Provider (PT): Trixie Deis    Encounter Date: 04/26/2019  PT End of Session - 04/26/19 1218    Visit Number  14    Number of Visits  18    Date for PT Re-Evaluation  05/13/19   Progress note complete visit #10 on 04/13/19   Authorization Type  [employer] Medicare    Authorization Time Period  10/06-->04/27/19    Authorization - Visit Number  6    Authorization - Number of Visits  10    Equipment Utilized During Treatment  Gait belt    Activity Tolerance  Patient tolerated treatment well;Patient limited by fatigue    Behavior During Therapy  [facility] for tasks assessed/performed       Past Medical History:  Diagnosis Date  . Ankle swelling    takes torsemide prn  . Anxiety   . Arthritis   . Asthma    allergy induced  . Back pain   . Complication of anesthesia   . Depression   . GERD (gastroesophageal reflux disease)   . Hypercholesterolemia   . Hypothyroid   . Parkinson disease ([place])   . PONV (postoperative nausea and vomiting)   . Seasonal allergies     Past Surgical History:  Procedure Laterality Date  . ABDOMINAL HYSTERECTOMY    . CARPAL TUNNEL RELEASE Right 10/26/2015   Procedure: CARPAL TUNNEL RELEASE;  Surgeon: Daryll Brod, MD;  Location: [place];  Service: Orthopedics;  Laterality: Right;  . CARPOMETACARPEL SUSPENSION PLASTY Right 10/26/2015   Procedure: RIGHT HAND TRAPEZIECTOMY WITH THUMB METACARPEL SUSPENSION PLASTY;  Surgeon: Daryll Brod, MD;  Location: [place];  Service: Orthopedics;  Laterality: Right;  . CATARACT EXTRACTION W/PHACO  12/23/2011   Procedure: CATARACT EXTRACTION PHACO AND INTRAOCULAR LENS PLACEMENT (IOC);  Surgeon: Williams Che, MD;  [facility];  Service:  Ophthalmology;  Laterality: Right;  CDE=5.16  . CHOLECYSTECTOMY    . EYE SURGERY     cataract extraction of left eye-[facility]-2002  . KNEE ARTHROSCOPY     right  . NISSEN FUNDOPLICATION     [facility]  . TRIGGER FINGER RELEASE Right 10/26/2015   Procedure: RELEASE A-1 PULLEY RIGHT RING FINGER;  Surgeon: Daryll Brod, MD;  Location: [place];  Service: Orthopedics;  Laterality: Right;    There were no vitals filed for this visit.  Subjective Assessment - 04/26/19 1218    Subjective  pt was late for appt today.  pt states she is doing well and can tell her balance is improving.                       [facility] Adult PT Treatment/Exercise - 04/26/19 0001      Balance Poses: Yoga   Warrior I  3 reps;15 seconds    Warrior II  3 reps;15 seconds      Knee/Hip Exercises: Standing   Forward Step Up  Right;Left;2 sets;10 reps;Hand Hold: 1;Step Height: 6"    Forward Step Up Limitations  with power ups with opposite LE and 1 UE assist      Knee/Hip Exercises: Seated   Sit to Sand  2 sets;10 reps;without UE support          Balance Exercises - 04/26/19  1149      Balance Exercises: Standing   SLS with Vectors  5 reps;Limitations;Upper extremity assist 1   5" holds each   Tandem Gait  2 reps    Retro Gait  2 reps    Sidestepping  2 reps          PT Short Term Goals - 04/13/19 1358      PT SHORT TERM GOAL #1   Title  Pt to be I in HEP to improve posture to decreae lumbar pain as well as improve overall balance    Time  3    Period  Weeks    Status  Partially Met    Target Date  04/06/19      PT SHORT TERM GOAL #2   Title  PT to be able to single leg stance on both LE for 10 secoonds in order to decrease pt risk of falling, ( no falls in the past week)    Time  3    Period  Weeks    Status  On-going      PT SHORT TERM GOAL #3   Title  Pt core and LE strengh to be improved 1/2 grade to allow pt to be able to come sit to stand from low lying chair without  losing her balance.    Time  3    Period  Weeks    Status  Achieved        PT Long Term Goals - 04/13/19 1400      PT LONG TERM GOAL #1   Title  PT balance to have improved to allow pt to single leg stance for 20" or more on both LE to allow pt to feel confident walking to her pond using a walking stick.    Time  6    Period  Weeks    Status  On-going      PT LONG TERM GOAL #2   Title  PT to have had not falls in the past 4 weeks    Time  6    Period  Weeks    Status  On-going      PT LONG TERM GOAL #3   Title  PT core and LE strength to be increased one grade to allow pt to feel confident walking for 30 mintues without an assisitve device.    Time  6    Period  Weeks    Status  On-going            Plan - 04/26/19 1223    Clinical Impression Statement  pt continues to progress well with balance activities, requiring cues for posturing and form.  Noted dizziness and LOB after completing retro ambulation, however able to finish task without any noted LOB.  Pt only required one seated rest break during session today.  Has 4 visits remaining per last Progress noted completed.    Personal Factors and Comorbidities  Age;Comorbidity 2;Time since onset of injury/illness/exacerbation    Comorbidities  OA< parkinsons, HTN, Chronic neck and back pain    Examination-Activity Limitations  Bed Mobility;Bend;Carry;Dressing;Lift;Locomotion Level;Sit;Squat;Stairs;Stand    Examination-Participation Restrictions  Church;Cleaning;Community Activity;Laundry;Yard Work    [employer] officer  Evolving/Moderate complexity    Rehab Potential  Good    PT Duration  3 weeks   Then 2x/week for 3 weeks   PT Treatment/Interventions  ADLs/Self Care Home Management;Functional mobility training;Gait training;Therapeutic activities;Therapeutic exercise;Balance training;Neuromuscular re-education;Patient/family education;Manual techniques    PT Next Visit Plan  New Cert needed. Continue to  progress dynamic balance and BLE strengthening.  continue X 4 more sessions.    PT Home Exercise Plan  sitting tall, bridge and hip abduction       Patient will benefit from skilled therapeutic intervention in order to improve the following deficits and impairments:  Abnormal gait, Decreased activity tolerance, Decreased balance, Decreased range of motion, Decreased strength, Difficulty walking, Postural dysfunction, Pain  Visit Diagnosis: Unsteadiness on feet  Muscle weakness (generalized)  Abnormal posture     Problem List Patient Active Problem List   Diagnosis Date Noted  . GERD (gastroesophageal reflux disease)   . History of Nissen fundoplication   . IBS (irritable bowel syndrome) 02/05/2018  . Diarrhea 06/16/2017  . N&V (nausea and vomiting) 06/16/2017  . Numbness 08/18/2015  . Polyneuropathy 08/18/2015  . Menopausal syndrome (hot flushes) 06/15/2015  . Tremor, essential 06/15/2015  . Gastroesophageal reflux disease with esophagitis 06/15/2015  . Bilateral carpal tunnel syndrome 06/15/2015  . Resting tremor 06/15/2015  . Scoliosis of thoracic spine 11/05/2013  . Spinal stenosis, unspecified region other than cervical 12/03/2012  . Spondylosis of unspecified site without mention of myelopathy 12/03/2012  . Parkinson disease ([place])   . Back pain   . Seasonal allergies    Teena Irani, PTA/CLT [phone]   Teena Irani 04/26/2019, 12:33 PM  [facility] [address] [place], [place], [address] Phone: [phone]   Fax:  [phone]  Name: [name] MRN: [id number] Date of Birth: 23-Oct-1933

## 2019-04-28 ENCOUNTER — Encounter (HOSPITAL_COMMUNITY): Payer: Self-pay

## 2019-04-28 ENCOUNTER — Ambulatory Visit (HOSPITAL_COMMUNITY): Payer: Medicare Other

## 2019-04-28 ENCOUNTER — Other Ambulatory Visit: Payer: Self-pay

## 2019-04-28 DIAGNOSIS — R2681 Unsteadiness on feet: Secondary | ICD-10-CM

## 2019-04-28 DIAGNOSIS — R293 Abnormal posture: Secondary | ICD-10-CM | POA: Diagnosis not present

## 2019-04-28 DIAGNOSIS — M6281 Muscle weakness (generalized): Secondary | ICD-10-CM

## 2019-04-28 NOTE — Addendum Note (Signed)
Addended by: Clarene Critchley on: 04/28/2019 02:44 PM   Modules accepted: Orders

## 2019-04-28 NOTE — Therapy (Signed)
[place], [place], [address] Phone: [phone]   Fax:  [phone]  Physical Therapy Treatment  Patient Details  Name: [name] MRN: [id number] Date of Birth: 09-05-1933 Referring Provider (PT): Trixie Deis    Encounter Date: 04/28/2019  PT End of Session - 04/28/19 1407    Visit Number  15    Number of Visits  18    Date for PT Re-Evaluation  05/05/19   Progress note complete visit #10   Authorization Type  [facility] Medicare    Authorization Time Period  10/06-->04/27/19; 11/18-->05/05/19    Authorization - Visit Number  15    Authorization - Number of Visits  20    PT Start Time  1330   Pt late for apt   PT Stop Time  1400    PT Time Calculation (min)  30 min    Equipment Utilized During Treatment  Gait belt    Activity Tolerance  Patient tolerated treatment well;Patient limited by fatigue    Behavior During Therapy  [facility] for tasks assessed/performed       Past Medical History:  Diagnosis Date  . Ankle swelling    takes torsemide prn  . Anxiety   . Arthritis   . Asthma    allergy induced  . Back pain   . Complication of anesthesia   . Depression   . GERD (gastroesophageal reflux disease)   . Hypercholesterolemia   . Hypothyroid   . Parkinson disease ([place])   . PONV (postoperative nausea and vomiting)   . Seasonal allergies     Past Surgical History:  Procedure Laterality Date  . ABDOMINAL HYSTERECTOMY    . CARPAL TUNNEL RELEASE Right 10/26/2015   Procedure: CARPAL TUNNEL RELEASE;  Surgeon: Daryll Brod, MD;  Location: [place];  Service: Orthopedics;  Laterality: Right;  . CARPOMETACARPEL SUSPENSION PLASTY Right 10/26/2015   Procedure: RIGHT HAND TRAPEZIECTOMY WITH THUMB METACARPEL SUSPENSION PLASTY;  Surgeon: Daryll Brod, MD;  Location: [place];  Service: Orthopedics;  Laterality: Right;  . CATARACT EXTRACTION W/PHACO  12/23/2011   Procedure: CATARACT  EXTRACTION PHACO AND INTRAOCULAR LENS PLACEMENT (IOC);  Surgeon: Williams Che, MD;  [facility];  Service: Ophthalmology;  Laterality: Right;  CDE=5.16  . CHOLECYSTECTOMY    . EYE SURGERY     cataract extraction of left eye-[facility]  . KNEE ARTHROSCOPY     right  . NISSEN FUNDOPLICATION     [facility]  . TRIGGER FINGER RELEASE Right 10/26/2015   Procedure: RELEASE A-1 PULLEY RIGHT RING FINGER;  Surgeon: Daryll Brod, MD;  Location: [place];  Service: Orthopedics;  Laterality: Right;    There were no vitals filed for this visit.  Subjective Assessment - 04/28/19 1336    Subjective  Pt late for apt today, stated she was watching granddaughter and reason late.  Reports she has improved with balance, does have tendency to drift when walking.    Pertinent History  parkinson, OA, scoliosis,    Patient Stated Goals  Able to bend over without falling    Currently in Pain?  Yes    Pain Score  3     Pain Location  Back    Pain Orientation  Upper;Lower    Pain Descriptors / Indicators  Aching    Pain Type  Chronic pain    Pain Onset  More than a month ago    Pain Frequency  Constant  Aggravating Factors   activity    Pain Relieving Factors  rest    Effect of Pain on Daily Activities  limits                       [facility] Adult PT Treatment/Exercise - 04/28/19 0001      Balance Poses: Yoga   Warrior I  3 reps;30 seconds    Warrior II  2 reps;30 seconds      Knee/Hip Exercises: Standing   Functional Squat  10 reps    Functional Squat Limitations  picking up small item from floor    Other Standing Knee Exercises  UE flexion against wall 10 reps          Balance Exercises - 04/28/19 1425      Balance Exercises: Standing   SLS with Vectors  5 reps;Limitations;Upper extremity assist 1    Balance Beam  Tandem and sidestep 2RT on balance beam          PT Short Term Goals - 04/13/19 1358      PT SHORT TERM GOAL #1   Title  Pt to be I in HEP to  improve posture to decreae lumbar pain as well as improve overall balance    Time  3    Period  Weeks    Status  Partially Met    Target Date  04/06/19      PT SHORT TERM GOAL #2   Title  PT to be able to single leg stance on both LE for 10 secoonds in order to decrease pt risk of falling, ( no falls in the past week)    Time  3    Period  Weeks    Status  On-going      PT SHORT TERM GOAL #3   Title  Pt core and LE strengh to be improved 1/2 grade to allow pt to be able to come sit to stand from low lying chair without losing her balance.    Time  3    Period  Weeks    Status  Achieved        PT Long Term Goals - 04/13/19 1400      PT LONG TERM GOAL #1   Title  PT balance to have improved to allow pt to single leg stance for 20" or more on both LE to allow pt to feel confident walking to her pond using a walking stick.    Time  6    Period  Weeks    Status  On-going      PT LONG TERM GOAL #2   Title  PT to have had not falls in the past 4 weeks    Time  6    Period  Weeks    Status  On-going      PT LONG TERM GOAL #3   Title  PT core and LE strength to be increased one grade to allow pt to feel confident walking for 30 mintues without an assisitve device.    Time  6    Period  Weeks    Status  On-going            Plan - 04/28/19 1414    Clinical Impression Statement  Pt educated on importance of posture to assist with pain and balance.  Able to progress to balance beam with cueing to improve posture/core activation that did assist wiht balance with min A for LOB episodes  on dynamic surface.  Added squats for lifting small objects from floor, pt able to demonstrate and reports confidence with lifting at home.  No reports of pain through session, was limited by fatigue.  1 episode of lightheaded with fast turn around that was resolved shortly, vital signs WNL.    Personal Factors and Comorbidities  Age;Comorbidity 2;Time since onset of injury/illness/exacerbation     Comorbidities  OA< parkinsons, HTN, Chronic neck and back pain    Examination-Activity Limitations  Bed Mobility;Bend;Carry;Dressing;Lift;Locomotion Level;Sit;Squat;Stairs;Stand    Examination-Participation Restrictions  Church;Cleaning;Community Activity;Laundry;Yard Work    [employer]chant [employer] officer  Evolving/Moderate complexity    Clinical Decision Making  Moderate    Rehab Potential  Good    PT Frequency  3x / week    PT Duration  3 weeks   Then 2x/week for 3 weeks   PT Treatment/Interventions  ADLs/Self Care Home Management;Functional mobility training;Gait training;Therapeutic activities;Therapeutic exercise;Balance training;Neuromuscular re-education;Patient/family education;Manual techniques    PT Next Visit Plan  Reassess next session.    PT Home Exercise Plan  sitting tall, bridge and hip abduction       Patient will benefit from skilled therapeutic intervention in order to improve the following deficits and impairments:  Abnormal gait, Decreased activity tolerance, Decreased balance, Decreased range of motion, Decreased strength, Difficulty walking, Postural dysfunction, Pain  Visit Diagnosis: Unsteadiness on feet  Muscle weakness (generalized)  Abnormal posture     Problem List Patient Active Problem List   Diagnosis Date Noted  . GERD (gastroesophageal reflux disease)   . History of Nissen fundoplication   . IBS (irritable bowel syndrome) 02/05/2018  . Diarrhea 06/16/2017  . N&V (nausea and vomiting) 06/16/2017  . Numbness 08/18/2015  . Polyneuropathy 08/18/2015  . Menopausal syndrome (hot flushes) 06/15/2015  . Tremor, essential 06/15/2015  . Gastroesophageal reflux disease with esophagitis 06/15/2015  . Bilateral carpal tunnel syndrome 06/15/2015  . Resting tremor 06/15/2015  . Scoliosis of thoracic spine 11/05/2013  . Spinal stenosis, unspecified region other than cervical 12/03/2012  . Spondylosis of unspecified site without mention of myelopathy  12/03/2012  . Parkinson disease ([place])   . Back pain   . Seasonal allergies    I[place], [place]; [place]   Aldona Lento 04/28/2019, 2:25 PM  [place], [place], [address] Phone: [phone]   Fax:  [phone]  Name: [name] MRN: [id number] Date of Birth: Feb 23, 1934

## 2019-04-28 NOTE — Addendum Note (Signed)
Addended by: Clarene Critchley on: 04/28/2019 02:17 PM   Modules accepted: Orders

## 2019-05-03 ENCOUNTER — Encounter (HOSPITAL_COMMUNITY): Payer: Self-pay | Admitting: Physical Therapy

## 2019-05-03 ENCOUNTER — Ambulatory Visit (HOSPITAL_COMMUNITY): Payer: Medicare Other | Admitting: Physical Therapy

## 2019-05-03 ENCOUNTER — Other Ambulatory Visit: Payer: Self-pay

## 2019-05-03 DIAGNOSIS — R2681 Unsteadiness on feet: Secondary | ICD-10-CM

## 2019-05-03 DIAGNOSIS — M6281 Muscle weakness (generalized): Secondary | ICD-10-CM

## 2019-05-03 DIAGNOSIS — R293 Abnormal posture: Secondary | ICD-10-CM

## 2019-05-03 DIAGNOSIS — R29898 Other symptoms and signs involving the musculoskeletal system: Secondary | ICD-10-CM

## 2019-05-03 NOTE — Therapy (Signed)
[place], [place], [address] Phone: [phone]   Fax:  [phone]  Physical Therapy Treatment  Patient Details  Name: [name] MRN: [id number] Date of Birth: 1933/06/26 Referring Provider (PT): Trixie Deis    Encounter Date: 05/03/2019  PT End of Session - 05/03/19 1607    Visit Number  16    Number of Visits  20    Date for PT Re-Evaluation  05/20/19   Progress note complete visit #10   Authorization Type  [facility] Medicare    Authorization Time Period  10/06-->04/27/19; 11/18-->05/05/19; 11/23 ->12/10    Authorization - Visit Number  16    Authorization - Number of Visits  20    PT Start Time  2694    PT Stop Time  1615    PT Time Calculation (min)  30 min    Equipment Utilized During Treatment  Gait belt    Activity Tolerance  Patient tolerated treatment well;Patient limited by fatigue    Behavior During Therapy  [facility] for tasks assessed/performed       Past Medical History:  Diagnosis Date  . Ankle swelling    takes torsemide prn  . Anxiety   . Arthritis   . Asthma    allergy induced  . Back pain   . Complication of anesthesia   . Depression   . GERD (gastroesophageal reflux disease)   . Hypercholesterolemia   . Hypothyroid   . Parkinson disease ([place])   . PONV (postoperative nausea and vomiting)   . Seasonal allergies     Past Surgical History:  Procedure Laterality Date  . ABDOMINAL HYSTERECTOMY    . CARPAL TUNNEL RELEASE Right 10/26/2015   Procedure: CARPAL TUNNEL RELEASE;  Surgeon: Daryll Brod, MD;  Location: [place];  Service: Orthopedics;  Laterality: Right;  . CARPOMETACARPEL SUSPENSION PLASTY Right 10/26/2015   Procedure: RIGHT HAND TRAPEZIECTOMY WITH THUMB METACARPEL SUSPENSION PLASTY;  Surgeon: Daryll Brod, MD;  Location: [place];  Service: Orthopedics;  Laterality: Right;  . CATARACT EXTRACTION W/PHACO  12/23/2011   Procedure: CATARACT  EXTRACTION PHACO AND INTRAOCULAR LENS PLACEMENT (IOC);  Surgeon: Williams Che, MD;  Location: [facility];  Service: Ophthalmology;  Laterality: Right;  CDE=5.16  . CHOLECYSTECTOMY    . EYE SURGERY     cataract extraction of left eye-[facility]-2002  . KNEE ARTHROSCOPY     right  . NISSEN FUNDOPLICATION     [facility]  . TRIGGER FINGER RELEASE Right 10/26/2015   Procedure: RELEASE A-1 PULLEY RIGHT RING FINGER;  Surgeon: Daryll Brod, MD;  Location: [place];  Service: Orthopedics;  Laterality: Right;    There were no vitals filed for this visit.  Subjective Assessment - 05/03/19 1548    Subjective  PT late for her appointment.  The pt states that when she woke up this morning she had increased back pain.  She tried some stretches but it did not really help. There is no radicular sx and the pain is slightly greater on the right.    Pertinent History  parkinson, OA, scoliosis,    Limitations  Sitting;Standing;Walking    How long can you sit comfortably?  ok    How long can you stand comfortably?  a few minutes ,(5) today due to exacerbation of pain.    How long can you walk comfortably?  five    Patient Stated Goals  Able to bend over without falling  Currently in Pain?  Yes    Pain Score  7     Pain Location  Back    Pain Orientation  Right    Pain Descriptors / Indicators  Sharp;Tightness    Pain Type  Acute pain   woke up with increased low back pain today, was fine yesterday   Pain Onset  More than a month ago    Pain Frequency  Intermittent    Aggravating Factors   wieght bearing    Pain Relieving Factors  rest    Effect of Pain on Daily Activities  limits         [facility] PT Assessment - 05/03/19 0001      Assessment   Medical Diagnosis  Frequent falls    Referring Provider (PT)  Megan Millikin     Onset Date/Surgical Date  12/09/18    Prior Therapy  --      Home Environment   Living Environment  Private residence    Home Access  Stairs to enter    Entrance  Stairs-Number of Steps  3    Entrance Stairs-Rails  Can reach both      Cognition   Overall Cognitive Status  Within Functional Limits for tasks assessed      Observation/Other Assessments   Focus on Therapeutic Outcomes (FOTO)   53   was 44 adjusted     Functional Tests   Functional tests  Single leg stance;Sit to Stand      Single Leg Stance   Comments  Rt 5 sec was 1; LT 6 " was 1       Sit to Stand   Comments  5 x 13.22 was 19.58       Posture/Postural Control   Posture/Postural Control  Postural limitations    Postural Limitations  Rounded Shoulders;Forward head;Increased thoracic kyphosis;Decreased lumbar lordosis;Flexed trunk      Strength   Right Hip Flexion  5/5   was 4/5   Right Hip Extension  4-/5   was 3-/5   Right Hip ABduction  4+/5   was 3/5    Left Hip Flexion  5/5   was 4/5    Left Hip Extension  4/5   was 4-/5    Left Hip ABduction  4+/5   was 3+/5      Palpation   Palpation comment  increased tension in       Ambulation/Gait   Assistive device  --                   [facility] Adult PT Treatment/Exercise - 05/03/19 0001      Ambulation/Gait   Ambulation Distance (Feet)  --      Knee/Hip Exercises: Seated   Other Seated Knee/Hip Exercises  sit as tall as possible x 10    Other Seated Knee/Hip Exercises  wback 10x      Knee/Hip Exercises: Supine   Other Supine Knee/Hip Exercises  stretching low back with lg ball forward, RT and LT .   x 5       Knee/Hip Exercises: Prone   Hip Extension  10 reps    Other Prone Exercises  shoulder extension B ; glut set B , ab set x 10 @       Manual Therapy   Manual Therapy  Soft tissue mobilization    Manual therapy comments  Manual complete separate than rest of tx    Soft tissue mobilization  to decrease pain.  PT Short Term Goals - 04/13/19 1358      PT SHORT TERM GOAL #1   Title  Pt to be I in HEP to improve posture to decreae lumbar pain as well as improve overall  balance    Time  3    Period  Weeks    Status  Partially Met    Target Date  04/06/19      PT SHORT TERM GOAL #2   Title  PT to be able to single leg stance on both LE for 10 secoonds in order to decrease pt risk of falling, ( no falls in the past week)    Time  3    Period  Weeks    Status  On-going      PT SHORT TERM GOAL #3   Title  Pt core and LE strengh to be improved 1/2 grade to allow pt to be able to come sit to stand from low lying chair without losing her balance.    Time  3    Period  Weeks    Status  Achieved        PT Long Term Goals - 04/13/19 1400      PT LONG TERM GOAL #1   Title  PT balance to have improved to allow pt to single leg stance for 20" or more on both LE to allow pt to feel confident walking to her pond using a walking stick.    Time  6    Period  Weeks    Status  On-going      PT LONG TERM GOAL #2   Title  PT to have had not falls in the past 4 weeks    Time  6    Period  Weeks    Status  On-going      PT LONG TERM GOAL #3   Title  PT core and LE strength to be increased one grade to allow pt to feel confident walking for 30 mintues without an assisitve device.    Time  6    Period  Weeks    Status  On-going            Plan - 05/03/19 1622    Clinical Impression Statement  PT comes to the department with increased lower thoracic and lumbar pain B with no radiculopathy.  Pt states she awoke this morning with the pain, went to bed last night just fine and had no new activities over the weekend.  Manual demonstrates increase tension in B paraspinal with RT > than LT.  Therapist used moist heat, manual and exercise to decrease pt pain.  Pain went from a 7 to a 4 during treatment.  PT was about to be discharged, however we will continue to see pt twice a week for two more weeks to make sure she will return to her 4 or less pain level.  Therapist counseled pt to use her heated seats in the car going home and sto be vigalent with body mechanics  and posture to keep from restraining her back.    Personal Factors and Comorbidities  Age;Comorbidity 2;Time since onset of injury/illness/exacerbation    Comorbidities  OA< parkinsons, HTN, Chronic neck and back pain    Examination-Activity Limitations  Bed Mobility;Bend;Carry;Dressing;Lift;Locomotion Level;Sit;Squat;Stairs;Stand    Examination-Participation Restrictions  Church;Cleaning;Community Activity;Laundry;Yard Work    Stability/Clinical Decision Making  Evolving/Moderate complexity    Rehab Potential  Good    PT Frequency  3x / week  PT Duration  4 weeks   Then 2x/week for 3 weeksl an additional 2x for 2 more weeks   PT Treatment/Interventions  ADLs/Self Care Home Management;Functional mobility training;Gait training;Therapeutic activities;Therapeutic exercise;Balance training;Neuromuscular re-education;Patient/family education;Manual techniques;Moist Heat    PT Next Visit Plan  If back pain is still up work on gentle mobs to reduce LBP, continue to work on posture and body mechanics as well as core strength and balance.    PT Home Exercise Plan  sitting tall, bridge and hip abduction       Patient will benefit from skilled therapeutic intervention in order to improve the following deficits and impairments:  Abnormal gait, Decreased activity tolerance, Decreased balance, Decreased range of motion, Decreased strength, Difficulty walking, Postural dysfunction, Pain  Visit Diagnosis: Unsteadiness on feet  Muscle weakness (generalized)  Abnormal posture  Other symptoms and signs involving the musculoskeletal system     Problem List Patient Active Problem List   Diagnosis Date Noted  . GERD (gastroesophageal reflux disease)   . History of Nissen fundoplication   . IBS (irritable bowel syndrome) 02/05/2018  . Diarrhea 06/16/2017  . N&V (nausea and vomiting) 06/16/2017  . Numbness 08/18/2015  . Polyneuropathy 08/18/2015  . Menopausal syndrome (hot flushes) 06/15/2015  .  Tremor, essential 06/15/2015  . Gastroesophageal reflux disease with esophagitis 06/15/2015  . Bilateral carpal tunnel syndrome 06/15/2015  . Resting tremor 06/15/2015  . Scoliosis of thoracic spine 11/05/2013  . Spinal stenosis, unspecified region other than cervical 12/03/2012  . Spondylosis of unspecified site without mention of myelopathy 12/03/2012  . Parkinson disease ([place])   . Back pain   . Seasonal allergies     Rayetta Humphrey, [place] CLT [phone] 05/03/2019, 4:31 PM  [facility] [address] [place], [place], [address] Phone: [phone]   Fax:  [phone]  Name: [name] MRN: [id number] Date of Birth: 1933-11-14

## 2019-05-05 ENCOUNTER — Ambulatory Visit (HOSPITAL_COMMUNITY): Payer: Medicare Other | Admitting: Physical Therapy

## 2019-05-05 ENCOUNTER — Other Ambulatory Visit: Payer: Self-pay

## 2019-05-05 DIAGNOSIS — R2681 Unsteadiness on feet: Secondary | ICD-10-CM

## 2019-05-05 DIAGNOSIS — R293 Abnormal posture: Secondary | ICD-10-CM | POA: Diagnosis not present

## 2019-05-05 DIAGNOSIS — M6281 Muscle weakness (generalized): Secondary | ICD-10-CM

## 2019-05-05 DIAGNOSIS — R29898 Other symptoms and signs involving the musculoskeletal system: Secondary | ICD-10-CM

## 2019-05-05 NOTE — Therapy (Signed)
[place], [place], [address] Phone: [phone]   Fax:  [phone]  Physical Therapy Treatment  Patient Details  Name: [name] MRN: [id number] Date of Birth: 1934-04-11 Referring Provider (PT): Trixie Deis    Encounter Date: 05/05/2019  PT End of Session - 05/05/19 1218    Visit Number  17    Number of Visits  20    Date for PT Re-Evaluation  05/20/19   Progress note complete visit #10   Authorization Type  [facility] Medicare    Authorization Time Period  10/06-->04/27/19; 11/18-->05/05/19; 11/23 ->12/10    Authorization - Visit Number  17    Authorization - Number of Visits  20    PT Start Time  1130    PT Stop Time  1218    PT Time Calculation (min)  48 min    Equipment Utilized During Treatment  Gait belt    Activity Tolerance  Patient tolerated treatment well;Patient limited by fatigue    Behavior During Therapy  [facility] for tasks assessed/performed       Past Medical History:  Diagnosis Date  . Ankle swelling    takes torsemide prn  . Anxiety   . Arthritis   . Asthma    allergy induced  . Back pain   . Complication of anesthesia   . Depression   . GERD (gastroesophageal reflux disease)   . Hypercholesterolemia   . Hypothyroid   . Parkinson disease ([place])   . PONV (postoperative nausea and vomiting)   . Seasonal allergies     Past Surgical History:  Procedure Laterality Date  . ABDOMINAL HYSTERECTOMY    . CARPAL TUNNEL RELEASE Right 10/26/2015   Procedure: CARPAL TUNNEL RELEASE;  Surgeon: Daryll Brod, MD;  Location: [place];  Service: Orthopedics;  Laterality: Right;  . CARPOMETACARPEL SUSPENSION PLASTY Right 10/26/2015   Procedure: RIGHT HAND TRAPEZIECTOMY WITH THUMB METACARPEL SUSPENSION PLASTY;  Surgeon: Daryll Brod, MD;  Location: [place];  Service: Orthopedics;  Laterality: Right;  . CATARACT EXTRACTION W/PHACO  12/23/2011   Procedure: CATARACT  EXTRACTION PHACO AND INTRAOCULAR LENS PLACEMENT (IOC);  Surgeon: Williams Che, MD;  Location: [facility];  Service: Ophthalmology;  Laterality: Right;  CDE=5.16  . CHOLECYSTECTOMY    . EYE SURGERY     cataract extraction of left eye-[facility]-2002  . KNEE ARTHROSCOPY     right  . NISSEN FUNDOPLICATION     [facility]  . TRIGGER FINGER RELEASE Right 10/26/2015   Procedure: RELEASE A-1 PULLEY RIGHT RING FINGER;  Surgeon: Daryll Brod, MD;  Location: [place];  Service: Orthopedics;  Laterality: Right;    There were no vitals filed for this visit.  Subjective Assessment - 05/05/19 1135    Subjective  Pt states that her back is better but still hurting.    Pertinent History  parkinson, OA, scoliosis,    Limitations  Sitting;Standing;Walking    How long can you sit comfortably?  ok    How long can you stand comfortably?  a few minutes ,(5) today due to exacerbation of pain.    How long can you walk comfortably?  five    Patient Stated Goals  Able to bend over without falling    Currently in Pain?  Yes    Pain Score  5     Pain Location  Back    Pain Orientation  Lower    Pain Descriptors / Indicators  Aching;Tightness    Pain Type  Acute pain    Pain Onset  1 to 4 weeks ago    Pain Frequency  Intermittent                       [facility] Adult PT Treatment/Exercise - 05/05/19 0001      Knee/Hip Exercises: Stretches   Gastroc Stretch Limitations  slant board stretch:  30" x 3      Knee/Hip Exercises: Standing   SLS  x3 B  X 20" each with min assist UE    Other Standing Knee Exercises  UE flexion against wall 10 reps, marching x 10  ; wall arch x 10    Other Standing Knee Exercises  hip ab/ext x 10  each ; side step with green tband x 2 Rt       Knee/Hip Exercises: Seated   Other Seated Knee/Hip Exercises  sit as tall as possible x 10    Other Seated Knee/Hip Exercises  wback 10x  With 2#    Sit to Sand  10 reps               PT Short Term Goals -  04/13/19 1358      PT SHORT TERM GOAL #1   Title  Pt to be I in HEP to improve posture to decreae lumbar pain as well as improve overall balance    Time  3    Period  Weeks    Status  Partially Met    Target Date  04/06/19      PT SHORT TERM GOAL #2   Title  PT to be able to single leg stance on both LE for 10 secoonds in order to decrease pt risk of falling, ( no falls in the past week)    Time  3    Period  Weeks    Status  On-going      PT SHORT TERM GOAL #3   Title  Pt core and LE strengh to be improved 1/2 grade to allow pt to be able to come sit to stand from low lying chair without losing her balance.    Time  3    Period  Weeks    Status  Achieved        PT Long Term Goals - 04/13/19 1400      PT LONG TERM GOAL #1   Title  PT balance to have improved to allow pt to single leg stance for 20" or more on both LE to allow pt to feel confident walking to her pond using a walking stick.    Time  6    Period  Weeks    Status  On-going      PT LONG TERM GOAL #2   Title  PT to have had not falls in the past 4 weeks    Time  6    Period  Weeks    Status  On-going      PT LONG TERM GOAL #3   Title  PT core and LE strength to be increased one grade to allow pt to feel confident walking for 30 mintues without an assisitve device.    Time  6    Period  Weeks    Status  On-going            Plan - 05/05/19 1219    Clinical Impression Statement  Pt states her back pain is doing  better; resumed balance, mobilty and hip strengtheing exercises.  Pt had two episodes of being off balance needing minimal assist and needed three short rest breaks with today's exercises.    Personal Factors and Comorbidities  Age;Comorbidity 2;Time since onset of injury/illness/exacerbation    Comorbidities  OA< parkinsons, HTN, Chronic neck and back pain    Examination-Activity Limitations  Bed Mobility;Bend;Carry;Dressing;Lift;Locomotion Level;Sit;Squat;Stairs;Stand     Examination-Participation Restrictions  Church;Cleaning;Community Activity;Laundry;Yard Work    Stability/Clinical Decision Making  Evolving/Moderate complexity    Rehab Potential  Good    PT Frequency  3x / week    PT Duration  4 weeks   Then 2x/week for 3 weeksl an additional 2x for 2 more weeks   PT Treatment/Interventions  ADLs/Self Care Home Management;Functional mobility training;Gait training;Therapeutic activities;Therapeutic exercise;Balance training;Neuromuscular re-education;Patient/family education;Manual techniques;Moist Heat    PT Next Visit Plan  continue to work on posture and body mechanics as well as core strength and balance.    PT Home Exercise Plan  sitting tall, bridge and hip abduction       Patient will benefit from skilled therapeutic intervention in order to improve the following deficits and impairments:  Abnormal gait, Decreased activity tolerance, Decreased balance, Decreased range of motion, Decreased strength, Difficulty walking, Postural dysfunction, Pain  Visit Diagnosis: Unsteadiness on feet  Muscle weakness (generalized)  Abnormal posture  Other symptoms and signs involving the musculoskeletal system     Problem List Patient Active Problem List   Diagnosis Date Noted  . GERD (gastroesophageal reflux disease)   . History of Nissen fundoplication   . IBS (irritable bowel syndrome) 02/05/2018  . Diarrhea 06/16/2017  . N&V (nausea and vomiting) 06/16/2017  . Numbness 08/18/2015  . Polyneuropathy 08/18/2015  . Menopausal syndrome (hot flushes) 06/15/2015  . Tremor, essential 06/15/2015  . Gastroesophageal reflux disease with esophagitis 06/15/2015  . Bilateral carpal tunnel syndrome 06/15/2015  . Resting tremor 06/15/2015  . Scoliosis of thoracic spine 11/05/2013  . Spinal stenosis, unspecified region other than cervical 12/03/2012  . Spondylosis of unspecified site without mention of myelopathy 12/03/2012  . Parkinson disease ([place])   . Back  pain   . Seasonal allergies    Rayetta Humphrey, [place] CLT [phone] 05/05/2019, 12:21 PM  [employer] [address] [place], [place], [address] Phone: [phone]   Fax:  [phone]  Name: [name] MRN: [id number] Date of Birth: 1934/05/13

## 2019-05-11 ENCOUNTER — Ambulatory Visit (HOSPITAL_COMMUNITY): Payer: Medicare Other

## 2019-05-13 ENCOUNTER — Other Ambulatory Visit: Payer: Self-pay

## 2019-05-13 ENCOUNTER — Ambulatory Visit (HOSPITAL_COMMUNITY): Payer: Medicare Other | Attending: Family Medicine | Admitting: Physical Therapy

## 2019-05-13 DIAGNOSIS — M6281 Muscle weakness (generalized): Secondary | ICD-10-CM | POA: Insufficient documentation

## 2019-05-13 DIAGNOSIS — R29898 Other symptoms and signs involving the musculoskeletal system: Secondary | ICD-10-CM | POA: Diagnosis present

## 2019-05-13 DIAGNOSIS — R293 Abnormal posture: Secondary | ICD-10-CM | POA: Diagnosis present

## 2019-05-13 DIAGNOSIS — R2681 Unsteadiness on feet: Secondary | ICD-10-CM | POA: Insufficient documentation

## 2019-05-13 NOTE — Therapy (Signed)
[place], [place], [address] Phone: [phone]   Fax:  [phone]  Physical Therapy Treatment  Patient Details  Name: [name] MRN: [id number] Date of Birth: [birth year] Referring Provider (PT): Trixie Deis    Encounter Date: 05/13/2019  PT End of Session - 05/13/19 1344    Visit Number  18    Number of Visits  20    Date for PT Re-Evaluation  05/20/19   Progress note complete visit #10   Authorization Type  [facility] Medicare    Authorization Time Period  10/06-->04/27/19; 11/18-->05/05/19; 11/23 ->12/10    Authorization - Visit Number  18    Authorization - Number of Visits  20    PT Start Time  1320    PT Stop Time  1400    PT Time Calculation (min)  40 min    Equipment Utilized During Treatment  Gait belt    Activity Tolerance  Patient tolerated treatment well;Patient limited by fatigue    Behavior During Therapy  [facility] for tasks assessed/performed       Past Medical History:  Diagnosis Date  . Ankle swelling    takes torsemide prn  . Anxiety   . Arthritis   . Asthma    allergy induced  . Back pain   . Complication of anesthesia   . Depression   . GERD (gastroesophageal reflux disease)   . Hypercholesterolemia   . Hypothyroid   . Parkinson disease ([place])   . PONV (postoperative nausea and vomiting)   . Seasonal allergies     Past Surgical History:  Procedure Laterality Date  . ABDOMINAL HYSTERECTOMY    . CARPAL TUNNEL RELEASE Right 10/26/2015   Procedure: CARPAL TUNNEL RELEASE;  Surgeon: Daryll Brod, MD;  Location: [place];  Service: Orthopedics;  Laterality: Right;  . CARPOMETACARPEL SUSPENSION PLASTY Right 10/26/2015   Procedure: RIGHT HAND TRAPEZIECTOMY WITH THUMB METACARPEL SUSPENSION PLASTY;  Surgeon: Daryll Brod, MD;  Location: [place];  Service: Orthopedics;  Laterality: Right;  . CATARACT EXTRACTION W/PHACO  12/23/2011   Procedure: CATARACT  EXTRACTION PHACO AND INTRAOCULAR LENS PLACEMENT (IOC);  Surgeon: Williams Che, MD;  Location: [facility];  Service: Ophthalmology;  Laterality: Right;  CDE=5.16  . CHOLECYSTECTOMY    . EYE SURGERY     cataract extraction of left eye-[facility]-2002  . KNEE ARTHROSCOPY     right  . NISSEN FUNDOPLICATION     [facility]  . TRIGGER FINGER RELEASE Right 10/26/2015   Procedure: RELEASE A-1 PULLEY RIGHT RING FINGER;  Surgeon: Daryll Brod, MD;  Location: [place];  Service: Orthopedics;  Laterality: Right;    There were no vitals filed for this visit.  Subjective Assessment - 05/13/19 1343    Subjective  pt states she is not hurting today or having any other issues.    Currently in Pain?  No/denies                       [facility] Adult PT Treatment/Exercise - 05/13/19 0001      Knee/Hip Exercises: Standing   Functional Squat  10 reps    Functional Squat Limitations  working on form    SLS  x3 B     SLS with Vectors  bilaterally 10X3" holds with 1 HHA    Gait Training  hurdles 6" X 4 3RT    Other Standing Knee Exercises  UE flexion against wall  10 reps, marching x 10 , wall arch x 10     Other Standing Knee Exercises  hip ab/ext x 15  each ; side step with green tband x 2 Rt       Knee/Hip Exercises: Seated   Other Seated Knee/Hip Exercises  sit as tall as possible x 10    Other Seated Knee/Hip Exercises  wback 10x    Sit to Sand  10 reps               PT Short Term Goals - 04/13/19 1358      PT SHORT TERM GOAL #1   Title  Pt to be I in HEP to improve posture to decreae lumbar pain as well as improve overall balance    Time  3    Period  Weeks    Status  Partially Met    Target Date  04/06/19      PT SHORT TERM GOAL #2   Title  PT to be able to single leg stance on both LE for 10 secoonds in order to decrease pt risk of falling, ( no falls in the past week)    Time  3    Period  Weeks    Status  On-going      PT SHORT TERM GOAL #3   Title  Pt core and  LE strengh to be improved 1/2 grade to allow pt to be able to come sit to stand from low lying chair without losing her balance.    Time  3    Period  Weeks    Status  Achieved        PT Long Term Goals - 04/13/19 1400      PT LONG TERM GOAL #1   Title  PT balance to have improved to allow pt to single leg stance for 20" or more on both LE to allow pt to feel confident walking to her pond using a walking stick.    Time  6    Period  Weeks    Status  On-going      PT LONG TERM GOAL #2   Title  PT to have had not falls in the past 4 weeks    Time  6    Period  Weeks    Status  On-going      PT LONG TERM GOAL #3   Title  PT core and LE strength to be increased one grade to allow pt to feel confident walking for 30 mintues without an assisitve device.    Time  6    Period  Weeks    Status  On-going            Plan - 05/13/19 1402    Clinical Impression Statement  continued with focus on improving LE stability and strength.  Pt required 2 short seated rest breaks durin gsession today.  One episode of being off balance but able to self correct.  Increased reps where able.  Challenged high marching completing with back against the wall and no UE assist.  pt with more diffiuclty keeping stable when lifting Lt LE.    Personal Factors and Comorbidities  Age;Comorbidity 2;Time since onset of injury/illness/exacerbation    Comorbidities  OA< parkinsons, HTN, Chronic neck and back pain    Examination-Activity Limitations  Bed Mobility;Bend;Carry;Dressing;Lift;Locomotion Level;Sit;Squat;Stairs;Stand    Examination-Participation Restrictions  Church;Cleaning;Community Activity;Laundry;Yard Work    Database administrator  Good    PT Frequency  3x / week    PT Duration  4 weeks   Then 2x/week for 3 weeksl an additional 2x for 2 more weeks   PT Treatment/Interventions  ADLs/Self Care Home Management;Functional mobility  training;Gait training;Therapeutic activities;Therapeutic exercise;Balance training;Neuromuscular re-education;Patient/family education;Manual techniques;Moist Heat    PT Next Visit Plan  continue to work on posture and body mechanics as well as core strength and balance.    PT Home Exercise Plan  sitting tall, bridge and hip abduction       Patient will benefit from skilled therapeutic intervention in order to improve the following deficits and impairments:  Abnormal gait, Decreased activity tolerance, Decreased balance, Decreased range of motion, Decreased strength, Difficulty walking, Postural dysfunction, Pain  Visit Diagnosis: Unsteadiness on feet  Muscle weakness (generalized)  Abnormal posture  Other symptoms and signs involving the musculoskeletal system     Problem List Patient Active Problem List   Diagnosis Date Noted  . GERD (gastroesophageal reflux disease)   . History of Nissen fundoplication   . IBS (irritable bowel syndrome) 02/05/2018  . Diarrhea 06/16/2017  . N&V (nausea and vomiting) 06/16/2017  . Numbness 08/18/2015  . Polyneuropathy 08/18/2015  . Menopausal syndrome (hot flushes) 06/15/2015  . Tremor, essential 06/15/2015  . Gastroesophageal reflux disease with esophagitis 06/15/2015  . Bilateral carpal tunnel syndrome 06/15/2015  . Resting tremor 06/15/2015  . Scoliosis of thoracic spine 11/05/2013  . Spinal stenosis, unspecified region other than cervical 12/03/2012  . Spondylosis of unspecified site without mention of myelopathy 12/03/2012  . Parkinson disease ([place])   . Back pain   . Seasonal allergies    Teena Irani, PTA/CLT [phone]  Teena Irani 05/13/2019, 2:05 PM  [place] [address] [place], [place], [address] Phone: [phone]   Fax:  [phone]  Name: [name] MRN: [id number] Date of Birth: 1933/09/26

## 2019-05-18 ENCOUNTER — Ambulatory Visit (HOSPITAL_COMMUNITY): Payer: Medicare Other | Admitting: Physical Therapy

## 2019-05-18 ENCOUNTER — Encounter (HOSPITAL_COMMUNITY): Payer: Self-pay | Admitting: Physical Therapy

## 2019-05-18 ENCOUNTER — Other Ambulatory Visit: Payer: Self-pay

## 2019-05-18 DIAGNOSIS — R293 Abnormal posture: Secondary | ICD-10-CM

## 2019-05-18 DIAGNOSIS — M6281 Muscle weakness (generalized): Secondary | ICD-10-CM

## 2019-05-18 DIAGNOSIS — R2681 Unsteadiness on feet: Secondary | ICD-10-CM | POA: Diagnosis not present

## 2019-05-18 NOTE — Therapy (Signed)
[place], [place], [address] Phone: [phone]   Fax:  [phone]  Physical Therapy Treatment  Patient Details  Name: [name] MRN: [id number] Date of Birth: June 16, 1933 Referring Provider (PT): Trixie Deis    Encounter Date: 05/18/2019  PT End of Session - 05/18/19 1139    Visit Number  19    Number of Visits  20    Date for PT Re-Evaluation  05/20/19   Progress note complete visit #10   Authorization Type  [facility] Medicare    Authorization Time Period  10/06-->04/27/19; 11/18-->05/05/19; 11/23 ->12/10    Authorization - Visit Number  19    Authorization - Number of Visits  20    PT Start Time  1103    PT Stop Time  1134    PT Time Calculation (min)  31 min    Equipment Utilized During Treatment  Gait belt    Activity Tolerance  Patient tolerated treatment well;Patient limited by fatigue    Behavior During Therapy  WFL for tasks assessed/performed       Past Medical History:  Diagnosis Date  . Ankle swelling    takes torsemide prn  . Anxiety   . Arthritis   . Asthma    allergy induced  . Back pain   . Complication of anesthesia   . Depression   . GERD (gastroesophageal reflux disease)   . Hypercholesterolemia   . Hypothyroid   . Parkinson disease ([place])   . PONV (postoperative nausea and vomiting)   . Seasonal allergies     Past Surgical History:  Procedure Laterality Date  . ABDOMINAL HYSTERECTOMY    . CARPAL TUNNEL RELEASE Right 10/26/2015   Procedure: CARPAL TUNNEL RELEASE;  Surgeon: Daryll Brod, MD;  Location: [place];  Service: Orthopedics;  Laterality: Right;  . CARPOMETACARPEL SUSPENSION PLASTY Right 10/26/2015   Procedure: RIGHT HAND TRAPEZIECTOMY WITH THUMB METACARPEL SUSPENSION PLASTY;  Surgeon: Daryll Brod, MD;  Location: [place];  Service: Orthopedics;  Laterality: Right;  . CATARACT EXTRACTION W/PHACO  12/23/2011   Procedure: CATARACT  EXTRACTION PHACO AND INTRAOCULAR LENS PLACEMENT (IOC);  Surgeon: Williams Che, MD;  Location: [facility];  Service: Ophthalmology;  Laterality: Right;  CDE=5.16  . CHOLECYSTECTOMY    . EYE SURGERY     cataract extraction of left eye-[facility]-2002  . KNEE ARTHROSCOPY     right  . NISSEN FUNDOPLICATION     [facility]  . TRIGGER FINGER RELEASE Right 10/26/2015   Procedure: RELEASE A-1 PULLEY RIGHT RING FINGER;  Surgeon: Daryll Brod, MD;  Location: [place];  Service: Orthopedics;  Laterality: Right;    There were no vitals filed for this visit.  Subjective Assessment - 05/18/19 1106    Subjective  PT states that her back is feeling alright.  She is running late due to running errands.    Pertinent History  parkinson, OA, scoliosis,    Limitations  Sitting;Standing;Walking    How long can you sit comfortably?  ok    How long can you stand comfortably?  a few minutes ,(5) today due to exacerbation of pain.    How long can you walk comfortably?  five    Patient Stated Goals  Able to bend over without falling    Currently in Pain?  No/denies    Pain Onset  1 to 4 weeks ago  [facility] Adult PT Treatment/Exercise - 05/18/19 0001      Exercises   Exercises  Lumbar      Lumbar Exercises: Standing   Scapular Retraction  10 reps;5 reps;Theraband    Row  10 reps;Theraband    Shoulder Extension  10 reps;Both;Theraband    Other Standing Lumbar Exercises  B hip abduction/extension keeping foot on the flloor.           Balance Exercises - 05/18/19 1137      Balance Exercises: Standing   Tandem Stance  Eyes open;Foam/compliant surface;2 reps    SLS with Vectors  Solid surface;3 reps;10 secs    Retro Gait  2 reps    Other Standing Exercises  Pallof green t-band x 10          PT Short Term Goals - 04/13/19 1358      PT SHORT TERM GOAL #1   Title  Pt to be I in HEP to improve posture to decreae lumbar pain as well as improve overall balance     Time  3    Period  Weeks    Status  Partially Met    Target Date  04/06/19      PT SHORT TERM GOAL #2   Title  PT to be able to single leg stance on both LE for 10 secoonds in order to decrease pt risk of falling, ( no falls in the past week)    Time  3    Period  Weeks    Status  On-going      PT SHORT TERM GOAL #3   Title  Pt core and LE strengh to be improved 1/2 grade to allow pt to be able to come sit to stand from low lying chair without losing her balance.    Time  3    Period  Weeks    Status  Achieved        PT Long Term Goals - 04/13/19 1400      PT LONG TERM GOAL #1   Title  PT balance to have improved to allow pt to single leg stance for 20" or more on both LE to allow pt to feel confident walking to her pond using a walking stick.    Time  6    Period  Weeks    Status  On-going      PT LONG TERM GOAL #2   Title  PT to have had not falls in the past 4 weeks    Time  6    Period  Weeks    Status  On-going      PT LONG TERM GOAL #3   Title  PT core and LE strength to be increased one grade to allow pt to feel confident walking for 30 mintues without an assisitve device.    Time  6    Period  Weeks    Status  On-going            Plan - 05/18/19 1139    Clinical Impression Statement  Pt late for appointment at this time therefore treatment was cut short.  PT will be ready for discharge after next session therefore gave pt tband exercises for postural exercises and Pallof to complete at home.  Pt continues to be challenged with balance activity needing min guard assist from therapist for safety.    Personal Factors and Comorbidities  Age;Comorbidity 2;Time since onset of injury/illness/exacerbation    Comorbidities  OA< parkinsons,  HTN, Chronic neck and back pain    Examination-Activity Limitations  Bed Mobility;Bend;Carry;Dressing;Lift;Locomotion Level;Sit;Squat;Stairs;Stand    Examination-Participation Restrictions  Church;Cleaning;Community  Activity;Laundry;Yard Work    Stability/Clinical Decision Making  Evolving/Moderate complexity    Rehab Potential  Good    PT Frequency  3x / week    PT Duration  4 weeks   Then 2x/week for 3 weeksl an additional 2x for 2 more weeks   PT Treatment/Interventions  ADLs/Self Care Home Management;Functional mobility training;Gait training;Therapeutic activities;Therapeutic exercise;Balance training;Neuromuscular re-education;Patient/family education;Manual techniques;Moist Heat    PT Next Visit Plan  Reassess, go over body mechanics for bed mobility and carrying ie groceries, completed foto, update HEP with any activity therapist feels will be beneficial    PT Home Exercise Plan  sitting tall, bridge and hip abduction;  12/6:  Thand postural exercises and palllof       Patient will benefit from skilled therapeutic intervention in order to improve the following deficits and impairments:  Abnormal gait, Decreased activity tolerance, Decreased balance, Decreased range of motion, Decreased strength, Difficulty walking, Postural dysfunction, Pain  Visit Diagnosis: Unsteadiness on feet  Muscle weakness (generalized)  Abnormal posture     Problem List Patient Active Problem List   Diagnosis Date Noted  . GERD (gastroesophageal reflux disease)   . History of Nissen fundoplication   . IBS (irritable bowel syndrome) 02/05/2018  . Diarrhea 06/16/2017  . N&V (nausea and vomiting) 06/16/2017  . Numbness 08/18/2015  . Polyneuropathy 08/18/2015  . Menopausal syndrome (hot flushes) 06/15/2015  . Tremor, essential 06/15/2015  . Gastroesophageal reflux disease with esophagitis 06/15/2015  . Bilateral carpal tunnel syndrome 06/15/2015  . Resting tremor 06/15/2015  . Scoliosis of thoracic spine 11/05/2013  . Spinal stenosis, unspecified region other than cervical 12/03/2012  . Spondylosis of unspecified site without mention of myelopathy 12/03/2012  . Parkinson disease ([place])   . Back pain   .  Seasonal allergies     Rayetta Humphrey, [place] CLT [phone] 05/18/2019, 11:43 AM  [facility] [address] [place], [place], [address] Phone: [phone]   Fax:  [phone]  Name: JERALYN NOLDEN MRN: [id number] Date of Birth: 03/03/1934

## 2019-05-20 ENCOUNTER — Encounter (HOSPITAL_COMMUNITY): Payer: Self-pay | Admitting: Physical Therapy

## 2019-05-20 ENCOUNTER — Ambulatory Visit (HOSPITAL_COMMUNITY): Payer: Medicare Other | Admitting: Physical Therapy

## 2019-05-20 ENCOUNTER — Other Ambulatory Visit: Payer: Self-pay

## 2019-05-20 DIAGNOSIS — R2681 Unsteadiness on feet: Secondary | ICD-10-CM | POA: Diagnosis not present

## 2019-05-20 DIAGNOSIS — R293 Abnormal posture: Secondary | ICD-10-CM

## 2019-05-20 DIAGNOSIS — M6281 Muscle weakness (generalized): Secondary | ICD-10-CM

## 2019-05-20 NOTE — Therapy (Signed)
[facility] [place], [place], [address] Phone: [phone]   Fax:  [phone]  Physical Therapy Treatment/ Progress Note  Patient Details  Name: [name] MRN: [id number] Date of Birth: 1933/12/02 Referring Provider (PT): Trixie Deis    Encounter Date: 05/20/2019   Progress Note Reporting Period 05/03/19 to 05/20/19  See note below for Objective Data and Assessment of Progress/Goals.       PT End of Session - 05/20/19 1045    Visit Number  20    Number of Visits  28    Date for PT Re-Evaluation  05/20/19   Progress note complete visit #10   Authorization Type  [facility] Medicare    Authorization Time Period  10/06-->04/27/19; 11/18-->05/05/19; 11/23 ->12/10; 05/20/19-06/17/19    Authorization - Visit Number  1    Authorization - Number of Visits  10    PT Start Time  1045   Patient arrived late   PT Stop Time  1115    PT Time Calculation (min)  30 min    Equipment Utilized During Treatment  Gait belt    Activity Tolerance  Patient tolerated treatment well    Behavior During Therapy  WFL for tasks assessed/performed       Past Medical History:  Diagnosis Date  . Ankle swelling    takes torsemide prn  . Anxiety   . Arthritis   . Asthma    allergy induced  . Back pain   . Complication of anesthesia   . Depression   . GERD (gastroesophageal reflux disease)   . Hypercholesterolemia   . Hypothyroid   . Parkinson disease ([place])   . PONV (postoperative nausea and vomiting)   . Seasonal allergies     Past Surgical History:  Procedure Laterality Date  . ABDOMINAL HYSTERECTOMY    . CARPAL TUNNEL RELEASE Right 10/26/2015   Procedure: CARPAL TUNNEL RELEASE;  Surgeon: Daryll Brod, MD;  Location: [facility];  Service: Orthopedics;  Laterality: Right;  . CARPOMETACARPEL SUSPENSION PLASTY Right 10/26/2015   Procedure: RIGHT HAND TRAPEZIECTOMY WITH THUMB METACARPEL SUSPENSION PLASTY;  Surgeon: Daryll Brod, MD;  Location: [facility];  Service: Orthopedics;  Laterality: Right;  . CATARACT EXTRACTION W/PHACO  12/23/2011   Procedure: CATARACT EXTRACTION PHACO AND INTRAOCULAR LENS PLACEMENT (IOC);  Surgeon: Williams Che, MD;  [facility];  Service: Ophthalmology;  Laterality: Right;  CDE=5.16  . CHOLECYSTECTOMY    . EYE SURGERY     cataract extraction of left eye-[facility]-2002  . KNEE ARTHROSCOPY     right  . NISSEN FUNDOPLICATION     [facility]  . TRIGGER FINGER RELEASE Right 10/26/2015   Procedure: RELEASE A-1 PULLEY RIGHT RING FINGER;  Surgeon: Daryll Brod, MD;  Location: [facility];  Service: Orthopedics;  Laterality: Right;    There were no vitals filed for this visit.  Subjective Assessment - 05/20/19 1051    Subjective  Patient says she feels definite improvement since starting therapy and has more energy and is moving more easily. Patient notes improved balance but still has occasional difficulty. Patient says she feels about 50% improvement since starting therapy.    Pertinent History  parkinson, OA, scoliosis,    Limitations  Sitting;Standing;Walking    How long can you sit comfortably?  ok    How long can you stand comfortably?  5-10 minutes    How long can you walk comfortably?  10 minutes  Patient Stated Goals  Able to bend over without falling    Currently in Pain?  Yes    Pain Score  3     Pain Location  Back    Pain Orientation  Posterior;Lower    Pain Descriptors / Indicators  Aching    Pain Type  Chronic pain    Pain Onset  More than a month ago    Pain Frequency  Constant    Aggravating Factors   standing    Pain Relieving Factors  rest    Effect of Pain on Daily Activities  limits         [facility] PT Assessment - 05/20/19 0001      Assessment   Medical Diagnosis  Frequent falls    Referring Provider (PT)  Megan Millikin     Onset Date/Surgical Date  12/09/18    Prior Therapy  yes 3 visits only       Precautions   Precautions   Fall      Restrictions   Weight Bearing Restrictions  No      Balance Screen   Has the patient fallen in the past 6 months  Yes      Home Environment   Living Environment  Private residence      Prior Function   Level of Independence  Independent      Cognition   Overall Cognitive Status  Within Functional Limits for tasks assessed      Observation/Other Assessments   Focus on Therapeutic Outcomes (FOTO)   46%   was 53%     Strength   Right Hip Flexion  5/5    Right Hip Extension  4/5   was 4-   Right Hip ABduction  4+/5    Left Hip Flexion  5/5    Left Hip Extension  4/5    Left Hip ABduction  4+/5      Balance   Balance Assessed  Yes      Static Standing Balance   Static Standing Balance -  Activities   Single Leg Stance - Right Leg;Single Leg Stance - Left Leg;Tandam Stance - Right Leg;Tandam Stance - Left Leg    Static Standing - Comment/# of Minutes  7 sec; 6 sec; 15 sec; 20 sec                            PT Education - 05/20/19 1053    Education Details  Patient educated on reassessment findings, and POC    Person(s) Educated  Patient    Methods  Explanation    Comprehension  Verbalized understanding       PT Short Term Goals - 05/20/19 1108      PT SHORT TERM GOAL #1   Title  Pt to be I in HEP to improve posture to decreae lumbar pain as well as improve overall balance    Time  3    Period  Weeks    Status  Achieved    Target Date  04/06/19      PT SHORT TERM GOAL #2   Title  PT to be able to single leg stance on both LE for 10 secoonds in order to decrease pt risk of falling, ( no falls in the past week)    Time  3    Period  Weeks    Status  On-going    Target Date  06/17/19        PT SHORT TERM GOAL #3   Title  Pt core and LE strengh to be improved 1/2 grade to allow pt to be able to come sit to stand from low lying chair without losing her balance.    Time  3    Period  Weeks    Status  Achieved        PT Long Term  Goals - 05/20/19 1109      PT LONG TERM GOAL #1   Title  PT balance to have improved to allow pt to single leg stance for 20" or more on both LE to allow pt to feel confident walking to her pond using a walking stick.    Time  6    Period  Weeks    Status  On-going    Target Date  06/17/19      PT LONG TERM GOAL #2   Title  PT to have had not falls in the past 4 weeks    Baseline  Patient recalls 1 fall 2 weeks ago    Time  6    Period  Weeks    Status  On-going    Target Date  06/17/19      PT LONG TERM GOAL #3   Title  PT core and LE strength to be increased one grade to allow pt to feel confident walking for 30 mintues without an assisitve device.    Time  6    Period  Weeks    Status  Partially Met    Target Date  06/17/19            Plan - 05/20/19 1113    Clinical Impression Statement  Patient is making slow, steady progress to LTGs. Patient has partially met LTGs, and shows improvement in strength and safety awareness, but continues to have difficulty with static and dynamic balance activity. Patient remains at risk for falls and will continue to benefit from physical therapy at this time to continue working on progressing static and dynamic balance, as well as core strengthening and stabilization to reduce ongoing back pain, improve LOF with ADLs, and reduce future risk for falls.    Personal Factors and Comorbidities  Age;Comorbidity 2;Time since onset of injury/illness/exacerbation    Comorbidities  OA< parkinsons, HTN, Chronic neck and back pain    Examination-Activity Limitations  Bed Mobility;Bend;Carry;Dressing;Lift;Locomotion Level;Sit;Squat;Stairs;Stand    Examination-Participation Restrictions  Church;Cleaning;Community Activity;Laundry;Yard Work    Stability/Clinical Decision Making  Evolving/Moderate complexity    Rehab Potential  Good    PT Frequency  2x / week    PT Duration  4 weeks   Then 2x/week for 3 weeksl an additional 2x for 2 more weeks   PT  Treatment/Interventions  ADLs/Self Care Home Management;Functional mobility training;Gait training;Therapeutic activities;Therapeutic exercise;Balance training;Neuromuscular re-education;Patient/family education;Manual techniques;Moist Heat    PT Next Visit Plan  Continue to progress core strength, static and dynamaic balance activity as tolerated.    PT Home Exercise Plan  sitting tall, bridge and hip abduction;  12/6:  Thand postural exercises and palllof    Consulted and Agree with Plan of Care  Patient       Patient will benefit from skilled therapeutic intervention in order to improve the following deficits and impairments:  Abnormal gait, Decreased activity tolerance, Decreased balance, Decreased range of motion, Decreased strength, Difficulty walking, Postural dysfunction, Pain  Visit Diagnosis: Unsteadiness on feet  Muscle weakness (generalized)  Abnormal posture     Problem List Patient Active Problem   List   Diagnosis Date Noted  . GERD (gastroesophageal reflux disease)   . History of Nissen fundoplication   . IBS (irritable bowel syndrome) 02/05/2018  . Diarrhea 06/16/2017  . N&V (nausea and vomiting) 06/16/2017  . Numbness 08/18/2015  . Polyneuropathy 08/18/2015  . Menopausal syndrome (hot flushes) 06/15/2015  . Tremor, essential 06/15/2015  . Gastroesophageal reflux disease with esophagitis 06/15/2015  . Bilateral carpal tunnel syndrome 06/15/2015  . Resting tremor 06/15/2015  . Scoliosis of thoracic spine 11/05/2013  . Spinal stenosis, unspecified region other than cervical 12/03/2012  . Spondylosis of unspecified site without mention of myelopathy 12/03/2012  . Parkinson disease ([facility])   . Back pain   . Seasonal allergies     11:16 AM, 05/20/19   PT DPT  Physical Therapist with [facility]  [phone]   [facility] [address] [place], [place], [address] Phone:  [phone]   Fax:  [phone]  Name: Machaela Q Dutkiewicz MRN: [id number] Date of Birth: 04/01/1934   

## 2019-05-25 ENCOUNTER — Ambulatory Visit (HOSPITAL_COMMUNITY): Payer: Medicare Other | Admitting: Physical Therapy

## 2019-05-25 ENCOUNTER — Telehealth (HOSPITAL_COMMUNITY): Payer: Self-pay | Admitting: Physical Therapy

## 2019-05-25 NOTE — Telephone Encounter (Signed)
She can not come today and plans to come tomorrow

## 2019-05-26 ENCOUNTER — Telehealth (HOSPITAL_COMMUNITY): Payer: Self-pay | Admitting: Physical Therapy

## 2019-05-26 ENCOUNTER — Ambulatory Visit (HOSPITAL_COMMUNITY): Payer: Medicare Other | Admitting: Physical Therapy

## 2019-05-26 NOTE — Telephone Encounter (Signed)
called to cancel due to the weather

## 2019-06-01 ENCOUNTER — Encounter (HOSPITAL_COMMUNITY): Payer: Self-pay

## 2019-06-01 ENCOUNTER — Other Ambulatory Visit: Payer: Self-pay

## 2019-06-01 ENCOUNTER — Ambulatory Visit (HOSPITAL_COMMUNITY): Payer: Medicare Other

## 2019-06-01 DIAGNOSIS — R2681 Unsteadiness on feet: Secondary | ICD-10-CM

## 2019-06-01 DIAGNOSIS — R293 Abnormal posture: Secondary | ICD-10-CM

## 2019-06-01 DIAGNOSIS — M6281 Muscle weakness (generalized): Secondary | ICD-10-CM

## 2019-06-01 NOTE — Therapy (Signed)
[place], [place], [address] Phone: [phone]   Fax:  [phone]  Physical Therapy Treatment  Patient Details  Name: [name] MRN: [id number] Date of Birth: [birth year] Referring Provider (PT): Trixie Deis    Encounter Date: 06/01/2019  PT End of Session - 06/01/19 1919    Visit Number  21    Number of Visits  28    Date for PT Re-Evaluation  06/17/19   Progress note complete visit #10 and #20, 05/20/19   Authorization Type  [employer] Medicare    Authorization Time Period  10/06-->04/27/19; 11/18-->05/05/19; 11/23 ->12/10; 05/20/19-06/17/19; Progress note complete visit #10 and #20, 05/20/19    Authorization - Visit Number  1    Authorization - Number of Visits  10    PT Start Time  1340    PT Stop Time  1402    PT Time Calculation (min)  22 min    Equipment Utilized During Treatment  Gait belt    Activity Tolerance  Patient tolerated treatment well    Behavior During Therapy  WFL for tasks assessed/performed       Past Medical History:  Diagnosis Date  . Ankle swelling    takes torsemide prn  . Anxiety   . Arthritis   . Asthma    allergy induced  . Back pain   . Complication of anesthesia   . Depression   . GERD (gastroesophageal reflux disease)   . Hypercholesterolemia   . Hypothyroid   . Parkinson disease ([place])   . PONV (postoperative nausea and vomiting)   . Seasonal allergies     Past Surgical History:  Procedure Laterality Date  . ABDOMINAL HYSTERECTOMY    . CARPAL TUNNEL RELEASE Right 10/26/2015   Procedure: CARPAL TUNNEL RELEASE;  Surgeon: Daryll Brod, MD;  Location: [facility];  Service: Orthopedics;  Laterality: Right;  . CARPOMETACARPEL SUSPENSION PLASTY Right 10/26/2015   Procedure: RIGHT HAND TRAPEZIECTOMY WITH THUMB METACARPEL SUSPENSION PLASTY;  Surgeon: Daryll Brod, MD;  Location: [facility];  Service: Orthopedics;  Laterality: Right;  . CATARACT  EXTRACTION W/PHACO  12/23/2011   Procedure: CATARACT EXTRACTION PHACO AND INTRAOCULAR LENS PLACEMENT (IOC);  Surgeon: Williams Che, MD;  [facility];  Service: Ophthalmology;  Laterality: Right;  CDE=5.16  . CHOLECYSTECTOMY    . EYE SURGERY     cataract extraction of left eye-[facility]-2002  . KNEE ARTHROSCOPY     right  . NISSEN FUNDOPLICATION     [facility]  . TRIGGER FINGER RELEASE Right 10/26/2015   Procedure: RELEASE A-1 PULLEY RIGHT RING FINGER;  Surgeon: Daryll Brod, MD;  Location: [facility];  Service: Orthopedics;  Laterality: Right;    There were no vitals filed for this visit.  Subjective Assessment - 06/01/19 1347    Subjective  Pt late for apt today, stated a delay at bank.  No reports of pain today    Pertinent History  parkinson, OA, scoliosis,    Patient Stated Goals  Able to bend over without falling    Currently in Pain?  No/denies                            Balance Exercises - 06/01/19 1507      Balance Exercises: Standing   Tandem Stance  Eyes open;Foam/compliant surface;2 reps    SLS  3 reps    SLS with Vectors  Solid surface;3 reps;10 secs    Tandem Gait  2 reps    Retro Gait  2 reps    Sidestepping  2 reps   GTB         PT Short Term Goals - 05/20/19 1108      PT SHORT TERM GOAL #1   Title  Pt to be I in HEP to improve posture to decreae lumbar pain as well as improve overall balance    Time  3    Period  Weeks    Status  Achieved    Target Date  04/06/19      PT SHORT TERM GOAL #2   Title  PT to be able to single leg stance on both LE for 10 secoonds in order to decrease pt risk of falling, ( no falls in the past week)    Time  3    Period  Weeks    Status  On-going    Target Date  06/17/19      PT SHORT TERM GOAL #3   Title  Pt core and LE strengh to be improved 1/2 grade to allow pt to be able to come sit to stand from low lying chair without losing her balance.    Time  3    Period  Weeks    Status   Achieved        PT Long Term Goals - 05/20/19 1109      PT LONG TERM GOAL #1   Title  PT balance to have improved to allow pt to single leg stance for 20" or more on both LE to allow pt to feel confident walking to her pond using a walking stick.    Time  6    Period  Weeks    Status  On-going    Target Date  06/17/19      PT LONG TERM GOAL #2   Title  PT to have had not falls in the past 4 weeks    Baseline  Patient recalls 1 fall 2 weeks ago    Time  6    Period  Weeks    Status  On-going    Target Date  06/17/19      PT LONG TERM GOAL #3   Title  PT core and LE strength to be increased one grade to allow pt to feel confident walking for 30 mintues without an assisitve device.    Time  6    Period  Weeks    Status  Partially Met    Target Date  06/17/19            Plan - 06/01/19 1921    Clinical Impression Statement  Pt late for apt, unable to complete full POC.  Session focus on balance training.  Pt continues to require frequent cueing to improve posture to assist with balance activities.  Min A required with NBOS/SLS activities for safety.  No reports of increased pain through sessoin.    Personal Factors and Comorbidities  Age;Comorbidity 2;Time since onset of injury/illness/exacerbation    Comorbidities  OA< parkinsons, HTN, Chronic neck and back pain    Examination-Activity Limitations  Bed Mobility;Bend;Carry;Dressing;Lift;Locomotion Level;Sit;Squat;Stairs;Stand    Examination-Participation Restrictions  Church;Cleaning;Community Activity;Laundry;Yard Work    Stability/Clinical Decision Making  Evolving/Moderate complexity    Clinical Decision Making  Moderate    Rehab Potential  Good    PT Frequency  2x / week    PT Duration  4 weeks  Then 2x/week for 3 weeksl an additional 2x for 2 more weeks   PT Treatment/Interventions  ADLs/Self Care Home Management;Functional mobility training;Gait training;Therapeutic activities;Therapeutic exercise;Balance  training;Neuromuscular re-education;Patient/family education;Manual techniques;Moist Heat    PT Next Visit Plan  Continue to progress core strength, static and dynamaic balance activity as tolerated.    PT Home Exercise Plan  sitting tall, bridge and hip abduction;  12/6:  Thand postural exercises and palllof       Patient will benefit from skilled therapeutic intervention in order to improve the following deficits and impairments:  Abnormal gait, Decreased activity tolerance, Decreased balance, Decreased range of motion, Decreased strength, Difficulty walking, Postural dysfunction, Pain  Visit Diagnosis: Unsteadiness on feet  Muscle weakness (generalized)  Abnormal posture     Problem List Patient Active Problem List   Diagnosis Date Noted  . GERD (gastroesophageal reflux disease)   . History of Nissen fundoplication   . IBS (irritable bowel syndrome) 02/05/2018  . Diarrhea 06/16/2017  . N&V (nausea and vomiting) 06/16/2017  . Numbness 08/18/2015  . Polyneuropathy 08/18/2015  . Menopausal syndrome (hot flushes) 06/15/2015  . Tremor, essential 06/15/2015  . Gastroesophageal reflux disease with esophagitis 06/15/2015  . Bilateral carpal tunnel syndrome 06/15/2015  . Resting tremor 06/15/2015  . Scoliosis of thoracic spine 11/05/2013  . Spinal stenosis, unspecified region other than cervical 12/03/2012  . Spondylosis of unspecified site without mention of myelopathy 12/03/2012  . Parkinson disease ([place])   . Back pain   . Seasonal allergies    [place], [place]; [place]  Aldona Lento 06/01/2019, 7:24 PM  [place], [place], [address] Phone: [phone]   Fax:  [phone]  Name: SARYIAH BENCOSME MRN: [id number] Date of Birth: 1934/01/28

## 2019-06-15 ENCOUNTER — Ambulatory Visit (HOSPITAL_COMMUNITY): Payer: Medicare Other | Admitting: Physical Therapy

## 2019-06-15 ENCOUNTER — Encounter (HOSPITAL_COMMUNITY): Payer: Self-pay | Admitting: Physical Therapy

## 2019-06-15 NOTE — Therapy (Signed)
[facility] [place], [place], [address] Phone: [phone]   Fax:  [phone]  Patient Details  Name: [name] MRN: [id number] Date of Birth: 1933/09/04 Referring Provider:  Trixie Deis Encounter Date: 06/15/2019 PHYSICAL THERAPY DISCHARGE SUMMARY  Visits from Start of Care: 21  Current functional level related to goals / functional outcomes: Unable to reassess as patient did not return for follow up visit. See most recent progress note dated 05/20/19   Remaining deficits: See most recent progress note dated 05/20/19   Education / Equipment: Patient called in to say she would like to be discharged form therapy at this time due to concern by her and her family members about being out in public due to ongoing [place] 19. Patient describes she is at increased risk and after discussing with her family, they would like her to avoid going out until she is able to receive vaccinations. Patient instructed to follow up with referring provider for new referral when she feels able to return. Patient instructed to follow up with therapy services with any further questions or concerns.  Plan: Patient agrees to discharge.  Patient goals were partially met. Patient is being discharged due to the patient's request.  ?????       1:40 PM, 06/15/19   Josue Hector PT DPT  Physical Therapist with [facility]  [phone] [place], [place], [address] Phone: [phone]   Fax:  [phone]

## 2019-06-17 ENCOUNTER — Ambulatory Visit (HOSPITAL_COMMUNITY): Payer: Medicare Other | Admitting: Physical Therapy

## 2019-06-22 ENCOUNTER — Encounter (HOSPITAL_COMMUNITY): Payer: Medicare Other | Admitting: Physical Therapy

## 2019-06-24 ENCOUNTER — Encounter (HOSPITAL_COMMUNITY): Payer: Medicare Other | Admitting: Physical Therapy

## 2019-07-01 ENCOUNTER — Telehealth: Payer: Self-pay | Admitting: Internal Medicine

## 2019-07-01 NOTE — Telephone Encounter (Signed)
PATIENT NEEDS PANTOPROZOL SENT TO [facility] IN [place].

## 2019-07-01 NOTE — Telephone Encounter (Signed)
Spoke with pt. Pt has refills on her medication. Called pts pharmacy and pt has refills. [facility]. Spoke with pt and she's aware that [facility] will fill her medication.

## 2019-08-04 ENCOUNTER — Ambulatory Visit: Payer: Medicare Other | Admitting: Gastroenterology

## 2019-08-16 ENCOUNTER — Other Ambulatory Visit: Payer: Self-pay

## 2019-08-16 ENCOUNTER — Ambulatory Visit: Payer: Medicare Other | Admitting: Gastroenterology

## 2019-08-16 ENCOUNTER — Encounter: Payer: Self-pay | Admitting: Gastroenterology

## 2019-08-16 VITALS — BP 129/78 | HR 85 | Temp 96.9°F | Ht 62.0 in | Wt 167.6 lb

## 2019-08-16 DIAGNOSIS — K580 Irritable bowel syndrome with diarrhea: Secondary | ICD-10-CM | POA: Diagnosis not present

## 2019-08-16 DIAGNOSIS — R112 Nausea with vomiting, unspecified: Secondary | ICD-10-CM | POA: Diagnosis not present

## 2019-08-16 MED ORDER — RABEPRAZOLE SODIUM 20 MG PO TBEC: 20.0000 mg | DELAYED_RELEASE_TABLET | Freq: Every day | ORAL | 1 refills | Status: DC

## 2019-08-16 NOTE — Patient Instructions (Addendum)
1. Please keep a diary regarding use of imodium and your bowel habits. Make sure to document how much imodium used each day and if you have constipation. Drop off after documenting two weeks worth. 2. You can continue imodium one every morning. Hold off on taking additional imodium throughout the day unless you have a a couple of loose stools. Hopefully this will help prevent constipation. 3. Try Aciphex 12m daily before breakfast. We will touch base with you after you drop off your stool diary. If persistent nausea and vomiting you will need further work up.

## 2019-08-16 NOTE — Progress Notes (Signed)
Primary Care Physician: Lemmie Evens, MD  Primary Gastroenterologist:  Garfield Cornea, MD   Chief Complaint  Patient presents with  . Nausea    with vomiting few times a week. Stopped protonix caused trouble urinating  . Diarrhea    improved but will have times where she feels constipated    HPI: [name] is a 86 y.o. female here for follow-up.  Last seen in October 2020.  She has a history of IBS, GERD.Previous Nissen fundoplication at [facility], 7341.  Last colonoscopy 2001 with no future screenings recommended due to age.    Upper GI series in November 2020 showed diffuse age-related esophageal dysmotility with poor clearance by primary peristaltic waves, typical extrinsic compression of the GE junction and distal esophagus from an intact fundoplication wrap not identified during exam but no hiatal hernia or gastroesophageal reflux noted.  At her last office visit we tried her on pantoprazole 40 mg daily for increased nausea, intermittent vomiting (new symptom).  States she stopped taking pantoprazole due to trouble urinating.  She is relieved she did not have recurrent hiatal hernia.  She has gained about 7 pounds in the past year.  Continues to vomit at least 3 times per week.  She is doing better on Imodium for management of chronic diarrhea.  At times she is getting constipated then.  She typically takes 1 Imodium every morning and if she has a loose stool she will take 1-2 more during the day.  No melena or rectal bleeding.  She has completed Covid vaccination.   Current Outpatient Medications  Medication Sig Dispense Refill  . albuterol (PROVENTIL) (2.5 MG/3ML) 0.083% nebulizer solution Take 2.5 mg by nebulization every 6 (six) hours as needed for wheezing or shortness of breath.    . Biotin 10 MG CAPS Take 1 capsule by mouth daily.     Marland Kitchen bismuth subsalicylate (PEPTO BISMOL) 262 MG/15ML suspension Take 30 mLs by mouth as needed.    . carbidopa-levodopa (SINEMET IR) 25-100  MG tablet TAKE 1 TABLET BY MOUTH 4  TIMES DAILY 360 tablet 3  . cetirizine-pseudoephedrine (ZYRTEC-D) 5-120 MG per tablet Take 1 tablet by mouth daily. For sinus     . Cholecalciferol (D3-1000 PO) Take by mouth daily.    Marland Kitchen escitalopram (LEXAPRO) 5 MG tablet Take 10 mg by mouth daily.    Marland Kitchen levothyroxine (SYNTHROID, LEVOTHROID) 25 MCG tablet Take 25 mcg by mouth daily.    Marland Kitchen loperamide (IMODIUM) 2 MG capsule Take 2-4 mg by mouth daily.     . mometasone (NASONEX) 50 MCG/ACT nasal spray Place 2 sprays into the nose 2 (two) times daily as needed. For allergies    . montelukast (SINGULAIR) 10 MG tablet Take 10 mg by mouth at bedtime.      . Multiple Vitamins-Minerals (CAL-DAY 1000 PO) Take by mouth daily.    . naproxen (NAPROSYN) 500 MG tablet Take 500 mg by mouth 2 (two) times daily as needed.     . potassium chloride (MICRO-K) 10 MEQ CR capsule Take 20 mEq by mouth daily.     Marland Kitchen QVAR REDIHALER 80 MCG/ACT AERB INHALATION TAKE 2 PUFFS DAILY TO PREVENT BREATHING PROBLEMS.  7  . torsemide (DEMADEX) 10 MG tablet Take 10 mg by mouth daily. 1/2 to 1 tab daily     No current facility-administered medications for this visit.    Allergies as of 08/16/2019 - Review Complete 08/16/2019  Allergen Reaction Noted  . Cephalosporins Rash 10/19/2010  .  Latex Rash 01/17/2011  . Lactose intolerance (gi) Nausea And Vomiting 12/09/2011  . Lactulose Nausea And Vomiting 06/15/2015  . Cephalexin Rash 06/15/2015  . Levofloxacin Anxiety and Rash 10/08/2018  . Meperidine and related Nausea And Vomiting 10/19/2010  . Omeprazole Rash 06/15/2015    ROS:  General: Negative for anorexia, weight loss, fever, chills, fatigue, weakness. ENT: Negative for hoarseness, difficulty swallowing , nasal congestion. CV: Negative for chest pain, angina, palpitations, dyspnea on exertion, peripheral edema.  Respiratory: Negative for dyspnea at rest, dyspnea on exertion, cough, sputum, wheezing.  GI: See history of present  illness. GU:  Negative for dysuria, hematuria, urinary incontinence, urinary frequency, nocturnal urination.  Endo: Negative for unusual weight change.    Physical Examination:   BP 129/78   Pulse 85   Temp (!) 96.9 F (36.1 C) (Oral)   Ht 5' 2"  (1.575 m)   Wt 167 lb 9.6 oz (76 kg)   BMI 30.65 kg/m   General: Well-nourished, well-developed in no acute distress.  Eyes: No icterus. Mouth: masked Lungs: Clear to auscultation bilaterally.  Heart: Regular rate and rhythm, no murmurs rubs or gallops.  Abdomen: Bowel sounds are normal, nontender, nondistended, no hepatosplenomegaly or masses, no abdominal bruits or hernia , no rebound or guarding.   Extremities: No lower extremity edema. No clubbing or deformities. Neuro: Alert and oriented x 4   Skin: Warm and dry, no jaundice.   Psych: Alert and cooperative, normal mood and affect.  Labs:  Lab Results  Component Value Date   WBC 4.3 08/14/2018   HGB 12.7 08/14/2018   HCT 40.4 08/14/2018   MCV 97.3 08/14/2018   PLT 200 08/14/2018     Imaging Studies: No results found.    Impression/Plan:  Pleasant 86 year old female presenting for follow-up of chronic GERD, chronic IBS-D, prior Nissen fundoplication for hiatal hernia.  Upper GI series showed no evidence of GERD or hiatal hernia but typical findings of fundoplication not seen.  Cannot exclude loosening of her wrap.  She continues to have vomiting about 3 times per week.  Did not tolerate pantoprazole, felt like she had difficulty urinating.  She is willing to try different PPI.  Try AcipHex 20 mg daily before breakfast.  If she has persistent nausea and vomiting, will require further work-up.  Overall diarrhea is better controlled with Imodium but she is having some intermittent constipation with use.  Recommend continuing 1 to 2 mg every morning.  However has had to take an additional dose with the first loose stool, would recommend waiting to see if she has any additional  stools before taking that second dose.  Hopefully this will help prevent constipation.  She will also need a stool diary regarding use of Imodium, episodes of constipation etc.  After 2 weeks she will drop it off for my review and we will find out how she is doing regarding her nausea and vomiting at that time as well

## 2019-09-06 ENCOUNTER — Encounter: Payer: Self-pay | Admitting: Adult Health

## 2019-09-06 ENCOUNTER — Other Ambulatory Visit: Payer: Self-pay

## 2019-09-06 ENCOUNTER — Ambulatory Visit: Payer: Medicare Other | Admitting: Adult Health

## 2019-09-06 VITALS — BP 135/83 | HR 82 | Temp 98.3°F | Ht 62.0 in | Wt 168.0 lb

## 2019-09-06 DIAGNOSIS — R296 Repeated falls: Secondary | ICD-10-CM

## 2019-09-06 DIAGNOSIS — G20 Parkinson's disease: Secondary | ICD-10-CM | POA: Diagnosis not present

## 2019-09-06 NOTE — Patient Instructions (Signed)
Your Plan:  Continue Sinemt Referral PT for balance  If your symptoms worsen or you develop new symptoms please let us know.    Thank you for coming to see [place] at [facility]. I hope we have been able to provide you high quality care today.  You may receive a patient satisfaction survey over the next few weeks. We would appreciate your feedback and comments so that we may continue to improve ourselves and the health of our patients.

## 2019-09-06 NOTE — Progress Notes (Addendum)
PATIENT: [name] DOB: 1934-03-18  REASON FOR VISIT: follow up HISTORY FROM: patient  HISTORY OF PRESENT ILLNESS: Today 09/06/19 :  [name] is an 86 year old female with a history of Parkinson's disease.  She returns today for follow-up.  She states that she was in physical therapy up until December.  She states that she was doing well. She states that she stopped therapy around Christmas due to increase in COVID-19 cases in [place].  She would like to restart therapy now.  She reports that she did have a recent fall on her back deck.  Reports that her tremor is stable.  Reports on occasion she may get choked when trying to swallow food particularly cornbread.  But this is not consistent.  She returns today for an evaluation.   HISTORY [name] is an 86 year old female with Parkinson's disease.  She returns today for follow-up.  She states that she noticed over the summer that the heat affects her Parkinson's.  Therefore she tries to stay indoors.  She states that her biggest issue is staying asleep.  She states that some nights she does not go to bed till after midnight.  And even then she may wake up several times.  She states that she tried taking Xanax before bed but it makes her sleep a lot.  She states in the past she is also tried melatonin but took it right before bedtime.  She continues on Sinemet but reports that she typically takes it 3 times a day instead of 4.  Reports that there are times that her balance has been off.  She has reported some falls.  She states that she typically falls forward.  Fortunately she is not suffered any significant injuries.  She states that she has been trying to use her cane more.  Denies any trouble eating.  Reports that she does eat slower than before.  She returns today for evaluation.   REVIEW OF SYSTEMS: Out of a complete 14 system review of symptoms, the patient complains only of the following symptoms, and all other  reviewed systems are negative.  See HPI  ALLERGIES: Allergies  Allergen Reactions  . Cephalosporins Rash  . Latex Rash    Breaks out then gets infected  . Lactose Intolerance (Gi) Nausea And Vomiting  . Lactulose Nausea And Vomiting  . Cephalexin Rash  . Levofloxacin Anxiety and Rash  . Meperidine And Related Nausea And Vomiting    Needed Phenergan to combat nausea.  . Omeprazole Rash    HOME MEDICATIONS: Outpatient Medications Prior to Visit  Medication Sig Dispense Refill  . albuterol (PROVENTIL) (2.5 MG/3ML) 0.083% nebulizer solution Take 2.5 mg by nebulization every 6 (six) hours as needed for wheezing or shortness of breath.    . Biotin 10 MG CAPS Take 1 capsule by mouth daily.     Marland Kitchen bismuth subsalicylate (PEPTO BISMOL) 262 MG/15ML suspension Take 30 mLs by mouth as needed.    . carbidopa-levodopa (SINEMET IR) 25-100 MG tablet TAKE 1 TABLET BY MOUTH 4  TIMES DAILY 360 tablet 3  . cetirizine-pseudoephedrine (ZYRTEC-D) 5-120 MG per tablet Take 1 tablet by mouth daily. For sinus     . Cholecalciferol (D3-1000 PO) Take by mouth daily.    Marland Kitchen escitalopram (LEXAPRO) 5 MG tablet Take 10 mg by mouth daily.    Marland Kitchen levothyroxine (SYNTHROID, LEVOTHROID) 25 MCG tablet Take 25 mcg by mouth daily.    Marland Kitchen loperamide (IMODIUM) 2 MG capsule Take  2-4 mg by mouth daily.     . mometasone (NASONEX) 50 MCG/ACT nasal spray Place 2 sprays into the nose 2 (two) times daily as needed. For allergies    . montelukast (SINGULAIR) 10 MG tablet Take 10 mg by mouth at bedtime.      . Multiple Vitamins-Minerals (CAL-DAY 1000 PO) Take by mouth daily.    . naproxen (NAPROSYN) 500 MG tablet Take 500 mg by mouth 2 (two) times daily as needed.     . potassium chloride (MICRO-K) 10 MEQ CR capsule Take 20 mEq by mouth daily.     Marland Kitchen QVAR REDIHALER 80 MCG/ACT AERB INHALATION TAKE 2 PUFFS DAILY TO PREVENT BREATHING PROBLEMS.  7  . RABEprazole (ACIPHEX) 20 MG tablet Take 1 tablet (20 mg total) by mouth daily before  breakfast. 30 tablet 1  . torsemide (DEMADEX) 10 MG tablet Take 10 mg by mouth daily. 1/2 to 1 tab daily     No facility-administered medications prior to visit.    PAST MEDICAL HISTORY: Past Medical History:  Diagnosis Date  . Ankle swelling    takes torsemide prn  . Anxiety   . Arthritis   . Asthma    allergy induced  . Back pain   . Complication of anesthesia   . Depression   . GERD (gastroesophageal reflux disease)   . Hypercholesterolemia   . Hypothyroid   . Parkinson disease ([place])   . PONV (postoperative nausea and vomiting)   . Seasonal allergies     PAST SURGICAL HISTORY: Past Surgical History:  Procedure Laterality Date  . ABDOMINAL HYSTERECTOMY    . CARPAL TUNNEL RELEASE Right 10/26/2015   Procedure: CARPAL TUNNEL RELEASE;  Surgeon: Daryll Brod, MD;  Location: [place];  Service: Orthopedics;  Laterality: Right;  . CARPOMETACARPEL SUSPENSION PLASTY Right 10/26/2015   Procedure: RIGHT HAND TRAPEZIECTOMY WITH THUMB METACARPEL SUSPENSION PLASTY;  Surgeon: Daryll Brod, MD;  Location: [place];  Service: Orthopedics;  Laterality: Right;  . CATARACT EXTRACTION W/PHACO  12/23/2011   Procedure: CATARACT EXTRACTION PHACO AND INTRAOCULAR LENS PLACEMENT (IOC);  Surgeon: Williams Che, MD;  Location: [facility];  Service: Ophthalmology;  Laterality: Right;  CDE=5.16  . CHOLECYSTECTOMY    . EYE SURGERY     cataract extraction of left eye-[facility]-2002  . KNEE ARTHROSCOPY     right  . NISSEN FUNDOPLICATION     [facility]  . TRIGGER FINGER RELEASE Right 10/26/2015   Procedure: RELEASE A-1 PULLEY RIGHT RING FINGER;  Surgeon: Daryll Brod, MD;  Location: [place];  Service: Orthopedics;  Laterality: Right;    FAMILY HISTORY: Family History  Problem Relation Age of Onset  . Stroke Mother   . COPD Father   . Stroke Father   . Colon cancer Neg Hx     SOCIAL HISTORY: Social History   Socioeconomic History  . Marital status: Married     Spouse name: Not on file  . Number of children: 2  . Years of education: Not on file  . Highest education level: Not on file  Occupational History    Comment: retired  Tobacco Use  . Smoking status: Never Smoker  . Smokeless tobacco: Never Used  Substance and Sexual Activity  . Alcohol use: Yes    Alcohol/week: 1.0 standard drinks    Types: 1 Glasses of wine per week    Comment: occ  . Drug use: No  . Sexual activity: Not on file  Other Topics Concern  .  Not on file  Social History Narrative   Patient lives at home alone and she is widowed.   Retired.    Education one year business course.   Right handed.   Caffeine two cup of coffee daily and one sweet tea.   Social Determinants of Health   Financial Resource Strain:   . Difficulty of Paying Living Expenses:   Food Insecurity:   . Worried About Charity fundraiser in the Last Year:   . Arboriculturist in the Last Year:   Transportation Needs:   . Film/video editor (Medical):   Marland Kitchen Lack of Transportation (Non-Medical):   Physical Activity:   . Days of Exercise per Week:   . Minutes of Exercise per Session:   Stress:   . Feeling of Stress :   Social Connections:   . Frequency of Communication with Friends and Family:   . Frequency of Social Gatherings with Friends and Family:   . Attends Religious Services:   . Active Member of Clubs or Organizations:   . Attends Archivist Meetings:   Marland Kitchen Marital Status:   Intimate Partner Violence:   . Fear of Current or Ex-Partner:   . Emotionally Abused:   Marland Kitchen Physically Abused:   . Sexually Abused:       PHYSICAL EXAM  Vitals:   09/06/19 1140  BP: 135/83  Pulse: 82  Temp: 98.3 F (36.8 C)  Weight: 168 lb (76.2 kg)  Height: 5' 2"  (1.575 m)   Body mass index is 30.73 kg/m.  Generalized: Well developed, in no acute distress   Neurological examination  Mentation: Alert oriented to time, place, history taking. Follows all commands speech and language  fluent Cranial nerve II-XII: Pupils were equal round reactive to light. Extraocular movements were full, visual field were full on confrontational test.  Head turning and shoulder shrug  were normal and symmetric. Motor: The motor testing reveals 5 over 5 strength of all 4 extremities. Good symmetric motor tone is noted throughout.  Mild resting tremor in the upper extremities.  Right greater than left Sensory: Sensory testing is intact to soft touch on all 4 extremities. No evidence of extinction is noted.  Coordination: Cerebellar testing reveals good finger-nose-finger and heel-to-shin bilaterally.  Gait and station: Patient uses a cane when ambulating.  Good stride and arm swing.  Tandem gait not attempted. Reflexes: Deep tendon reflexes are symmetric and normal bilaterally.   DIAGNOSTIC DATA (LABS, IMAGING, TESTING) - I reviewed patient records, labs, notes, testing and imaging myself where available.  Lab Results  Component Value Date   WBC 4.3 08/14/2018   HGB 12.7 08/14/2018   HCT 40.4 08/14/2018   MCV 97.3 08/14/2018   PLT 200 08/14/2018      Component Value Date/Time   NA 139 10/24/2015 1038   K 4.1 10/24/2015 1038   CL 106 10/24/2015 1038   CO2 26 10/24/2015 1038   GLUCOSE 93 10/24/2015 1038   BUN 13 10/24/2015 1038   CREATININE 0.88 10/24/2015 1038   CALCIUM 8.9 10/24/2015 1038   GFRNONAA >60 10/24/2015 1038   GFRAA >60 10/24/2015 1038      ASSESSMENT AND PLAN 86 y.o. year old female  has a past medical history of Ankle swelling, Anxiety, Arthritis, Asthma, Back pain, Complication of anesthesia, Depression, GERD (gastroesophageal reflux disease), Hypercholesterolemia, Hypothyroid, Parkinson disease ([place]), PONV (postoperative nausea and vomiting), and Seasonal allergies. here with :  1.  Parkinson's disease  -Continue Sinemet 25-100 mg  4 times a day -Referral set for physical therapy for gait and balance training -Advised if symptoms worsen or she develops new  symptoms she should let us know. -Follow-up in 6 months or sooner if needed   I spent 25 minutes of face-to-face and non-face-to-face time with patient.  This included previsit chart review, lab review, study review, order entry, electronic health record documentation, patient education.  Ward Givens, MSN, NP-C 09/06/2019, 11:39 AM [facility] [address], [place], [place] [address] [phone]  I reviewed the above note and documentation by the Nurse Practitioner and agree with the history, exam, assessment and plan as outlined above. I was available for consultation. Star Age, MD, PhD [facility])

## 2019-09-09 ENCOUNTER — Other Ambulatory Visit: Payer: Self-pay | Admitting: Gastroenterology

## 2019-09-21 ENCOUNTER — Encounter (HOSPITAL_COMMUNITY): Payer: Self-pay | Admitting: Physical Therapy

## 2019-09-21 ENCOUNTER — Ambulatory Visit (HOSPITAL_COMMUNITY): Payer: Medicare Other | Attending: Adult Health | Admitting: Physical Therapy

## 2019-09-21 ENCOUNTER — Other Ambulatory Visit: Payer: Self-pay

## 2019-09-21 DIAGNOSIS — R2681 Unsteadiness on feet: Secondary | ICD-10-CM | POA: Diagnosis present

## 2019-09-21 DIAGNOSIS — R262 Difficulty in walking, not elsewhere classified: Secondary | ICD-10-CM | POA: Insufficient documentation

## 2019-09-21 DIAGNOSIS — M6281 Muscle weakness (generalized): Secondary | ICD-10-CM | POA: Insufficient documentation

## 2019-09-21 NOTE — Therapy (Signed)
[place], [place], [address] Phone: [phone]   Fax:  [phone]  Physical Therapy Evaluation  Patient Details  Name: [name] MRN: [id number] Date of Birth: 27-Apr-1934 Referring Provider (PT):  Ward Givens NP   Encounter Date: 09/21/2019  PT End of Session - 09/21/19 1417    Visit Number  1    Number of Visits  12    Date for PT Re-Evaluation  11/02/19    Authorization Type  UHC Medicare, no co-ins, Copay $35, No auth, PT includes AQ coverage, No VL    Authorization Time Period  POC dates 09/21/19 to 11/02/2019    Progress Note Due on Visit  10    PT Start Time  1417   pt 15 min late to session   PT Stop Time  1500    PT Time Calculation (min)  43 min       Past Medical History:  Diagnosis Date  . Ankle swelling    takes torsemide prn  . Anxiety   . Arthritis   . Asthma    allergy induced  . Back pain   . Complication of anesthesia   . Depression   . GERD (gastroesophageal reflux disease)   . Hypercholesterolemia   . Hypothyroid   . Parkinson disease ([place])   . PONV (postoperative nausea and vomiting)   . Seasonal allergies     Past Surgical History:  Procedure Laterality Date  . ABDOMINAL HYSTERECTOMY    . CARPAL TUNNEL RELEASE Right 10/26/2015   Procedure: CARPAL TUNNEL RELEASE;  Surgeon: Daryll Brod, MD;  Location: [facility];  Service: Orthopedics;  Laterality: Right;  . CARPOMETACARPEL SUSPENSION PLASTY Right 10/26/2015   Procedure: RIGHT HAND TRAPEZIECTOMY WITH THUMB METACARPEL SUSPENSION PLASTY;  Surgeon: Daryll Brod, MD;  Location: [facility];  Service: Orthopedics;  Laterality: Right;  . CATARACT EXTRACTION W/PHACO  12/23/2011   Procedure: CATARACT EXTRACTION PHACO AND INTRAOCULAR LENS PLACEMENT (IOC);  Surgeon: Williams Che, MD;  Location: [facility];  Service: Ophthalmology;  Laterality: Right;  CDE=5.16  . CHOLECYSTECTOMY    . EYE SURGERY      cataract extraction of left eye-[facility]-2002  . KNEE ARTHROSCOPY     right  . NISSEN FUNDOPLICATION     [facility]  . TRIGGER FINGER RELEASE Right 10/26/2015   Procedure: RELEASE A-1 PULLEY RIGHT RING FINGER;  Surgeon: Daryll Brod, MD;  Location: [facility];  Service: Orthopedics;  Laterality: Right;    There were no vitals filed for this visit.   Subjective Assessment - 09/21/19 1429    Subjective  States that her back is still bothering her. States that she feels like she is getting more humped everyday. States that she feels like feels like she is getting worse and needs to build up some stamina, thinks her balance is still a problem but it is not any worse. States when she falls she tends to go over forward. States that she fell in February coming off of a step off the patio.  States she had her son come and get her (she has a Presenter, broadcasting). States her family checks in and out of her house pretty constantly and lives next door to her son and daughter constantly.    Pertinent History  parkinson's    Limitations  Standing;Walking;Lifting    Patient Stated Goals  to have less falls and more stamina    Currently in Pain?  Yes  Pain Score  4     Pain Location  Back    Pain Orientation  Lower;Posterior    Pain Descriptors / Indicators  Aching    Pain Type  Chronic pain    Aggravating Factors   vacuuming, lifting, walking    Pain Relieving Factors  naproxen, biofreeze, rest         [facility] PT Assessment - 09/21/19 0001      Assessment   Medical Diagnosis  balance deficits    Referring Provider (PT)   Ward Givens NP    Prior Therapy  yes at this clinic for same issue      Precautions   Precautions  Fall      Balance Screen   Has the patient fallen in the past 6 months  Yes    How many times?  2    Has the patient had a decrease in activity level because of a fear of falling?   Yes    Is the patient reluctant to leave their home because of a fear of falling?   No      Home  Environment   Living Environment  Private residence    Living Arrangements  Alone    Available Help at Discharge  Family    Type of [place] to enter    Entrance Stairs-Number of Steps  3    Entrance Stairs-Rails  Can reach both    [place]  One level    [place] - single point;Grab bars - tub/shower      Prior Function   Level of [place] with household mobility with device;Independent with basic ADLs   PRN help wiht heavy cleaning    Vocation  Retired      Charity fundraiser Status  Within Functional Limits for tasks assessed      Observation/Other Assessments   Focus on Therapeutic Outcomes (FOTO)   NA      Transfers   Transfers  Sit to Stand;Stand to Sit    Sit to Stand  7: Independent    Five time sit to stand comments   17.8 no UE use    Stand to Sit  7: Independent      Ambulation/Gait   Ambulation/Gait  Yes    Ambulation/Gait Assistance  6: Modified independent (Device/Increase time)    Ambulation Distance (Feet)  258 Feet    Assistive device  Straight cane    Gait Pattern  Decreased hip/knee flexion - right;Decreased hip/knee flexion - left;Trunk flexed;Narrow base of support    Ambulation Surface  Level;Indoor    Gait velocity  decreased      Standardized Balance Assessment   Standardized Balance Assessment  Berg Balance Test      Berg Balance Test   Sit to Stand  Able to stand without using hands and stabilize independently    Standing Unsupported  Able to stand safely 2 minutes    Sitting with Back Unsupported but Feet Supported on Floor or Stool  Able to sit safely and securely 2 minutes    Stand to Sit  Sits safely with minimal use of hands    Transfers  Able to transfer safely, minor use of hands    Standing Unsupported with Eyes Closed  Able to stand 10 seconds safely    Standing Unsupported with Feet Together  Able to place feet together independently and stand 1 minute  safely    From  Standing, Reach Forward with Outstretched Arm  Can reach forward >5 cm safely (2")    From Standing Position, Pick up Object from Floor  Able to pick up shoe, needs supervision    From Standing Position, Turn to Look Behind Over each Shoulder  Looks behind from both sides and weight shifts well    Turn 360 Degrees  Able to turn 360 degrees safely but slowly    Standing Unsupported, Alternately Place Feet on Step/Stool  Able to complete 4 steps without aid or supervision    Standing Unsupported, One Foot in [employer] balance while stepping or standing    Standing on One Leg  Tries to lift leg/unable to hold 3 seconds but remains standing independently    Total Score  42                Objective measurements completed on examination: See above findings.              PT Education - 09/21/19 1527    Education Details  in fall risk (BERG results), in using cane at all times (all falls have taken place when she did not have her cane). In PT rehab plan    Person(s) Educated  Patient    Methods  Explanation    Comprehension  Verbalized understanding       PT Short Term Goals - 09/21/19 1531      PT SHORT TERM GOAL #1   Title  Patient will be independent in self management strategies to improve quality of life and functional outcomes.    Time  3    Period  Weeks    Status  New    Target Date  10/12/19      PT SHORT TERM GOAL #2   Title  Patient will report at least 25% improvement in overall symptoms and/or functional ability.    Time  3    Period  Weeks    Status  New    Target Date  10/12/19        PT Long Term Goals - 09/21/19 1531      PT LONG TERM GOAL #1   Title  Patient will report at least 50% improvement in overall symptoms and/or functional ability.    Time  6    Period  Weeks    Status  New    Target Date  11/02/19      PT LONG TERM GOAL #2   Title  Patient will report no falls in the last 4 weeks to demonstrate improved balance at home and  awareness of fall risk.    Time  6    Period  Weeks    Status  New    Target Date  11/02/19      PT LONG TERM GOAL #3   Title  Patient will score with at least 46 points on BERG to decrease risk of falls from significant to moderate.    Baseline  current 42    Time  6    Period  Weeks    Status  New    Target Date  11/02/19             Plan - 09/21/19 1417    Clinical Impression Statement  Patient presents to therapy with history of falling and with reports of feeling unbalanced and weak. Previous treatment of these issues took place last year, but had to stop secondary to  surge in [place] numbers in the community. Patient now feels safe to return to therapy to work on her balance and functional strength. Patient presents with significant fall risk based on BERG results. Instructed patient to use cane at all times as all her falls occur when she does not use her cane. Patient would benefit from skilled physical therapy to improve functional mobility and balance to decrease risk of further falls.    Personal Factors and Comorbidities  Comorbidity 1;Comorbidity 2;Comorbidity 3+;Fitness    Comorbidities  parkingsons, history of falls, low back pain    Examination-Activity Limitations  Squat;Stairs;Stand;Transfers;Lift;Locomotion Level;Carry;Bend    Examination-Participation Restrictions  Cleaning;Meal Prep    Stability/Clinical Decision Making  Evolving/Moderate complexity    Clinical Decision Making  Moderate    Rehab Potential  Good    PT Frequency  2x / week    PT Duration  6 weeks    PT Treatment/Interventions  ADLs/Self Care Home Management;Aquatic Therapy;Cryotherapy;Electrical Stimulation;Moist Heat;Traction;Balance training;Therapeutic exercise;Therapeutic activities;Functional mobility training;Stair training;Gait training;DME Instruction;Neuromuscular re-education;Patient/family education;Manual techniques;Passive range of motion    PT Next Visit Plan  dynamic and static  balance, LE functional strength    PT Home Exercise Plan  establish next session    Consulted and Agree with Plan of Care  Patient       Patient will benefit from skilled therapeutic intervention in order to improve the following deficits and impairments:  Pain, Decreased range of motion, Decreased balance, Decreased coordination, Decreased activity tolerance, Abnormal gait, Decreased endurance, Decreased knowledge of precautions, Decreased strength, Difficulty walking  Visit Diagnosis: Unsteadiness on feet  Muscle weakness (generalized)  Difficulty in walking, not elsewhere classified     Problem List Patient Active Problem List   Diagnosis Date Noted  . GERD (gastroesophageal reflux disease)   . History of Nissen fundoplication   . IBS (irritable bowel syndrome) 02/05/2018  . Diarrhea 06/16/2017  . N&V (nausea and vomiting) 06/16/2017  . Numbness 08/18/2015  . Polyneuropathy 08/18/2015  . Menopausal syndrome (hot flushes) 06/15/2015  . Tremor, essential 06/15/2015  . Gastroesophageal reflux disease with esophagitis 06/15/2015  . Bilateral carpal tunnel syndrome 06/15/2015  . Resting tremor 06/15/2015  . Scoliosis of thoracic spine 11/05/2013  . Spinal stenosis, unspecified region other than cervical 12/03/2012  . Spondylosis of unspecified site without mention of myelopathy 12/03/2012  . Parkinson disease ([place])   . Back pain   . Seasonal allergies     3:40 PM, 09/21/19 Jerene Pitch, DPT Physical Therapy with [facility]  [phone] office  [place] [address] [place], [place], [address] Phone: [phone]   Fax:  [phone]  Name: AZARAH DACY MRN: [id number] Date of Birth: Oct 15, 1933

## 2019-09-22 ENCOUNTER — Ambulatory Visit (HOSPITAL_COMMUNITY): Payer: Medicare Other | Admitting: Physical Therapy

## 2019-09-28 ENCOUNTER — Encounter (HOSPITAL_COMMUNITY): Payer: Self-pay

## 2019-09-28 ENCOUNTER — Ambulatory Visit (HOSPITAL_COMMUNITY): Payer: Medicare Other

## 2019-09-28 ENCOUNTER — Other Ambulatory Visit: Payer: Self-pay

## 2019-09-28 DIAGNOSIS — M6281 Muscle weakness (generalized): Secondary | ICD-10-CM

## 2019-09-28 DIAGNOSIS — R2681 Unsteadiness on feet: Secondary | ICD-10-CM | POA: Diagnosis not present

## 2019-09-28 DIAGNOSIS — R262 Difficulty in walking, not elsewhere classified: Secondary | ICD-10-CM

## 2019-09-28 NOTE — Therapy (Signed)
[place], [place], [address] Phone: [phone]   Fax:  [phone]  Physical Therapy Treatment  Patient Details  Name: [name] MRN: [id number] Date of Birth: 1934/04/05 Referring Provider (PT):  Ward Givens NP   Encounter Date: 09/28/2019  PT End of Session - 09/28/19 1413    Visit Number  2    Number of Visits  12    Date for PT Re-Evaluation  11/02/19    Authorization Type  UHC Medicare, no co-ins, Copay $35, No auth, PT includes AQ coverage, No VL    Progress Note Due on Visit  10    PT Start Time  1410   pt late for apts   PT Stop Time  1443    PT Time Calculation (min)  33 min    Equipment Utilized During Treatment  Gait belt    Activity Tolerance  Patient tolerated treatment well;Patient limited by fatigue    Behavior During Therapy  WFL for tasks assessed/performed       Past Medical History:  Diagnosis Date  . Ankle swelling    takes torsemide prn  . Anxiety   . Arthritis   . Asthma    allergy induced  . Back pain   . Complication of anesthesia   . Depression   . GERD (gastroesophageal reflux disease)   . Hypercholesterolemia   . Hypothyroid   . Parkinson disease ([place])   . PONV (postoperative nausea and vomiting)   . Seasonal allergies     Past Surgical History:  Procedure Laterality Date  . ABDOMINAL HYSTERECTOMY    . CARPAL TUNNEL RELEASE Right 10/26/2015   Procedure: CARPAL TUNNEL RELEASE;  Surgeon: Daryll Brod, MD;  Location: [facility];  Service: Orthopedics;  Laterality: Right;  . CARPOMETACARPEL SUSPENSION PLASTY Right 10/26/2015   Procedure: RIGHT HAND TRAPEZIECTOMY WITH THUMB METACARPEL SUSPENSION PLASTY;  Surgeon: Daryll Brod, MD;  Location: [facility];  Service: Orthopedics;  Laterality: Right;  . CATARACT EXTRACTION W/PHACO  12/23/2011   Procedure: CATARACT EXTRACTION PHACO AND INTRAOCULAR LENS PLACEMENT (IOC);  Surgeon: Williams Che,  MD;  Location: [facility];  Service: Ophthalmology;  Laterality: Right;  CDE=5.16  . CHOLECYSTECTOMY    . EYE SURGERY     cataract extraction of left eye-[facility]-2002  . KNEE ARTHROSCOPY     right  . NISSEN FUNDOPLICATION     [facility]  . TRIGGER FINGER RELEASE Right 10/26/2015   Procedure: RELEASE A-1 PULLEY RIGHT RING FINGER;  Surgeon: Daryll Brod, MD;  Location: [facility];  Service: Orthopedics;  Laterality: Right;    There were no vitals filed for this visit.  Subjective Assessment - 09/28/19 1411    Subjective  Pt arrived ambulating with SPC.  Pt reports a fall outside, no reports of pain but did require some assistance to stand (does have lifeline around neck).  Pt reports she feels her back continues to roll up.    Pertinent History  parkinson's    Patient Stated Goals  to have less falls and more stamina    Currently in Pain?  No/denies                       [facility] Adult PT Treatment/Exercise - 09/28/19 0001      Ambulation/Gait   Ambulation/Gait  Yes    Ambulation/Gait Assistance  6: Modified independent (Device/Increase time)    Ambulation Distance (Feet)  500  Feet    Assistive device  Straight cane    Gait Pattern  Decreased hip/knee flexion - right;Decreased hip/knee flexion - left;Trunk flexed;Narrow base of support    Ambulation Surface  Level;Indoor    Gait velocity  decreased    Gait Comments  Cueing to improve 2 point sequence, slow down and posture      Exercises   Exercises  Knee/Hip      Knee/Hip Exercises: Seated   Other Seated Knee/Hip Exercises  Posture awareness    Other Seated Knee/Hip Exercises  RTB rows 2x 10          Balance Exercises - 09/28/19 1446      Balance Exercises: Standing   Standing Eyes Opened  Narrow base of support (BOS)   2 minutes without HHA, encouraged to complete at home by cou   Tandem Stance  2 reps;30 secs    Heel Raises  10 reps    Toe Raise  10 reps          PT Short Term Goals - 09/21/19  1531      PT SHORT TERM GOAL #1   Title  Patient will be independent in self management strategies to improve quality of life and functional outcomes.    Time  3    Period  Weeks    Status  New    Target Date  10/12/19      PT SHORT TERM GOAL #2   Title  Patient will report at least 25% improvement in overall symptoms and/or functional ability.    Time  3    Period  Weeks    Status  New    Target Date  10/12/19        PT Long Term Goals - 09/21/19 1531      PT LONG TERM GOAL #1   Title  Patient will report at least 50% improvement in overall symptoms and/or functional ability.    Time  6    Period  Weeks    Status  New    Target Date  11/02/19      PT LONG TERM GOAL #2   Title  Patient will report no falls in the last 4 weeks to demonstrate improved balance at home and awareness of fall risk.    Time  6    Period  Weeks    Status  New    Target Date  11/02/19      PT LONG TERM GOAL #3   Title  Patient will score with at least 46 points on BERG to decrease risk of falls from significant to moderate.    Baseline  current 42    Time  6    Period  Weeks    Status  New    Target Date  11/02/19            Plan - 09/28/19 1546    Clinical Impression Statement  Pt late for apt, reviewed apt times and dates and encouraged pt to arrive on time.  Reviewed goals this session and educated importance of compliance wiht HEP.  Pt arrived wiht SPC, reports she has had a fall outdoors since last here, ambulting without AD.  Pt strongly encouraged to continue with SPC at all times, indoor and outdoor to reduce risk of falls, verbalized understanding.  Gait training to improve sequence with cane, pt with tendency to advance cane following 4 steps, educated 2 point sequence to improve mechanics and encouraged to slow  down wiht gait at home to improve sequence.  Cueing to improve awareness of posture through session as well.  Balance activaities complete with CGA for safety, added NBOS  by sink at home to address balalnce wiht HEP.  Attempted tandem stance as additoinal HEP, pt with tendency to always lean to Rt so held this exercise for fall risk.  No reports of pain through session, was limited by fatigue required seated rest breaks.    Personal Factors and Comorbidities  Comorbidity 1;Comorbidity 2;Comorbidity 3+;Fitness    Comorbidities  parkingsons, history of falls, low back pain    Examination-Activity Limitations  Squat;Stairs;Stand;Transfers;Lift;Locomotion Level;Carry;Bend    Examination-Participation Restrictions  Cleaning;Meal Prep    Stability/Clinical Decision Making  Evolving/Moderate complexity    Clinical Decision Making  Moderate    Rehab Potential  Good    PT Frequency  2x / week    PT Duration  6 weeks    PT Treatment/Interventions  ADLs/Self Care Home Management;Aquatic Therapy;Cryotherapy;Electrical Stimulation;Moist Heat;Traction;Balance training;Therapeutic exercise;Therapeutic activities;Functional mobility training;Stair training;Gait training;DME Instruction;Neuromuscular re-education;Patient/family education;Manual techniques;Passive range of motion    PT Next Visit Plan  Add toe tapping next session.  dynamic and static balance, LE functional strength    PT Home Exercise Plan  09/28/19:  Use cane at all times, slow down gait and improve sequence.  Improve postural awareness 5times day.  Seated rows with RTB. heel/toe raises.  Standing NBOS by counter/sink.       Patient will benefit from skilled therapeutic intervention in order to improve the following deficits and impairments:  Pain, Decreased range of motion, Decreased balance, Decreased coordination, Decreased activity tolerance, Abnormal gait, Decreased endurance, Decreased knowledge of precautions, Decreased strength, Difficulty walking  Visit Diagnosis: Muscle weakness (generalized)  Unsteadiness on feet  Difficulty in walking, not elsewhere classified     Problem List Patient Active  Problem List   Diagnosis Date Noted  . GERD (gastroesophageal reflux disease)   . History of Nissen fundoplication   . IBS (irritable bowel syndrome) 02/05/2018  . Diarrhea 06/16/2017  . N&V (nausea and vomiting) 06/16/2017  . Numbness 08/18/2015  . Polyneuropathy 08/18/2015  . Menopausal syndrome (hot flushes) 06/15/2015  . Tremor, essential 06/15/2015  . Gastroesophageal reflux disease with esophagitis 06/15/2015  . Bilateral carpal tunnel syndrome 06/15/2015  . Resting tremor 06/15/2015  . Scoliosis of thoracic spine 11/05/2013  . Spinal stenosis, unspecified region other than cervical 12/03/2012  . Spondylosis of unspecified site without mention of myelopathy 12/03/2012  . Parkinson disease ([place])   . Back pain   . Seasonal allergies    Ihor Austin, LPTA/CLT; CBIS [phone]  Aldona Lento 09/28/2019, 4:01 PM  [place] [address] [place], [place], [address] Phone: [phone]   Fax:  [phone]  Name: [name] MRN: [id number] Date of Birth: June 20, 1933

## 2019-09-28 NOTE — Patient Instructions (Signed)
Posture - Sitting    Sit upright, head facing forward. Try using a roll to support lower back. Keep shoulders relaxed, and avoid rounded back. Keep hips level with knees. Avoid crossing legs for long periods.   Check your posture 5x daily.  Copyright  VHI. All rights reserved.   Scapular Retraction: Rowing (Eccentric) - Arms - 45 Degrees (Resistance Band)    Hold end of band in each hand. Pull back until elbows are even with trunk. Keep elbows out from sides at 45, thumbs up. Slowly release for 3-5 seconds. Use red resistance band. 10 reps per set, 2 sets per day, 4 days per week.   http://ecce.exer.us/229   Copyright  VHI. All rights reserved.   Walk with your cane everyday!  Slow down and focus on the sequence with cane.  Balance: Eyes Open - Bilateral (Varied Surfaces)     Standing TALL with feet close together, try to hold it for 2 minutes without losing balance. Repeat daily by counter/sink.    Copyright  VHI. All rights reserved.   Toe / Heel Raise (Standing)    Standing with support, raise heels, then rock back on heels and raise toes. Repeat 10 times. Use hand held assistance for safety, especially during toe raises.    Copyright  VHI. All rights reserved.

## 2019-09-30 ENCOUNTER — Encounter (HOSPITAL_COMMUNITY): Payer: Self-pay | Admitting: Physical Therapy

## 2019-09-30 ENCOUNTER — Other Ambulatory Visit: Payer: Self-pay

## 2019-09-30 ENCOUNTER — Ambulatory Visit (HOSPITAL_COMMUNITY): Payer: Medicare Other | Admitting: Physical Therapy

## 2019-09-30 DIAGNOSIS — M6281 Muscle weakness (generalized): Secondary | ICD-10-CM

## 2019-09-30 DIAGNOSIS — R262 Difficulty in walking, not elsewhere classified: Secondary | ICD-10-CM

## 2019-09-30 DIAGNOSIS — R2681 Unsteadiness on feet: Secondary | ICD-10-CM

## 2019-09-30 NOTE — Therapy (Signed)
[address] [place], [place], [address] Phone: [phone]   Fax:  [phone]  Physical Therapy Treatment  Patient Details  Name: [name] MRN: [id number] Date of Birth: [birth year] Referring Provider (PT):  Ward Givens NP   Encounter Date: 09/30/2019  PT End of Session - 09/30/19 1318    Visit Number  3    Number of Visits  12    Date for PT Re-Evaluation  11/02/19    Authorization Type  UHC Medicare, no co-ins, Copay $35, No auth, PT includes AQ coverage, No VL    Progress Note Due on Visit  10    PT Start Time  1315   arrived late   PT Stop Time  1343    PT Time Calculation (min)  28 min    Equipment Utilized During Treatment  Gait belt    Activity Tolerance  Patient tolerated treatment well;Patient limited by fatigue    Behavior During Therapy  WFL for tasks assessed/performed       Past Medical History:  Diagnosis Date  . Ankle swelling    takes torsemide prn  . Anxiety   . Arthritis   . Asthma    allergy induced  . Back pain   . Complication of anesthesia   . Depression   . GERD (gastroesophageal reflux disease)   . Hypercholesterolemia   . Hypothyroid   . Parkinson disease ([place])   . PONV (postoperative nausea and vomiting)   . Seasonal allergies     Past Surgical History:  Procedure Laterality Date  . ABDOMINAL HYSTERECTOMY    . CARPAL TUNNEL RELEASE Right 10/26/2015   Procedure: CARPAL TUNNEL RELEASE;  Surgeon: Daryll Brod, MD;  Location: [facility];  Service: Orthopedics;  Laterality: Right;  . CARPOMETACARPEL SUSPENSION PLASTY Right 10/26/2015   Procedure: RIGHT HAND TRAPEZIECTOMY WITH THUMB METACARPEL SUSPENSION PLASTY;  Surgeon: Daryll Brod, MD;  Location: [facility];  Service: Orthopedics;  Laterality: Right;  . CATARACT EXTRACTION W/PHACO  12/23/2011   Procedure: CATARACT EXTRACTION PHACO AND INTRAOCULAR LENS PLACEMENT (IOC);  Surgeon: Williams Che, MD;   Location: [facility];  Service: Ophthalmology;  Laterality: Right;  CDE=5.16  . CHOLECYSTECTOMY    . EYE SURGERY     cataract extraction of left eye-[facility]-2002  . KNEE ARTHROSCOPY     right  . NISSEN FUNDOPLICATION     [facility]  . TRIGGER FINGER RELEASE Right 10/26/2015   Procedure: RELEASE A-1 PULLEY RIGHT RING FINGER;  Surgeon: Daryll Brod, MD;  Location: [facility];  Service: Orthopedics;  Laterality: Right;    There were no vitals filed for this visit.  Subjective Assessment - 09/30/19 1317    Subjective  Patient says she had a bad day yesterday per som bad food that she ate. Says this left her feeling weak today. Sasy she needs continued work on her balance, but notes no falls since last therapy session.    Pertinent History  parkinson's    Patient Stated Goals  to have less falls and more stamina    Currently in Pain?  No/denies                       [facility] Adult PT Treatment/Exercise - 09/30/19 0001      Knee/Hip Exercises: Standing   Heel Raises  Both;20 reps    Heel Raises Limitations  toe raise x20    Forward Step Up  Both;1 set;15 reps;Hand Hold: 1;Step Height: 4"    Gait Training  200 feet with SPC, cues for gait speed and seqeuncing     Other Standing Knee Exercises  alternating step steps x20 on 4 inch box with no HHA; standing RTB rows 2 x 10     Other Standing Knee Exercises  NBOS with no HHA for 2 min; staggered stance 2 x 30" each                PT Short Term Goals - 09/21/19 1531      PT SHORT TERM GOAL #1   Title  Patient will be independent in self management strategies to improve quality of life and functional outcomes.    Time  3    Period  Weeks    Status  New    Target Date  10/12/19      PT SHORT TERM GOAL #2   Title  Patient will report at least 25% improvement in overall symptoms and/or functional ability.    Time  3    Period  Weeks    Status  New    Target Date  10/12/19        PT Long Term Goals - 09/21/19  1531      PT LONG TERM GOAL #1   Title  Patient will report at least 50% improvement in overall symptoms and/or functional ability.    Time  6    Period  Weeks    Status  New    Target Date  11/02/19      PT LONG TERM GOAL #2   Title  Patient will report no falls in the last 4 weeks to demonstrate improved balance at home and awareness of fall risk.    Time  6    Period  Weeks    Status  New    Target Date  11/02/19      PT LONG TERM GOAL #3   Title  Patient will score with at least 46 points on BERG to decrease risk of falls from significant to moderate.    Baseline  current 42    Time  6    Period  Weeks    Status  New    Target Date  11/02/19            Plan - 09/30/19 1344    Clinical Impression Statement  Patient tolerated session well today. Patient continues to be challenged with balance activity, though did well with staggered stance. Unable to maintain tandem standing without HHA. Patient demos fair return with form on gait using SPC, though requires cues for pacing gait speed do avoid irregular step length and losing balance. Patient with ongoing postural limitation, showing flexed trunk and rounded shoulder positioning, notably with standing activity. Patient cued on scapular retraction during band rows for appropriate target muscle activation.    Personal Factors and Comorbidities  Comorbidity 1;Comorbidity 2;Comorbidity 3+;Fitness    Comorbidities  parkingsons, history of falls, low back pain    Examination-Activity Limitations  Squat;Stairs;Stand;Transfers;Lift;Locomotion Level;Carry;Bend    Examination-Participation Restrictions  Cleaning;Meal Prep    Stability/Clinical Decision Making  Evolving/Moderate complexity    Rehab Potential  Good    PT Frequency  2x / week    PT Duration  6 weeks    PT Treatment/Interventions  ADLs/Self Care Home Management;Aquatic Therapy;Cryotherapy;Electrical Stimulation;Moist Heat;Traction;Balance training;Therapeutic  exercise;Therapeutic activities;Functional mobility training;Stair training;Gait training;DME Instruction;Neuromuscular re-education;Patient/family education;Manual techniques;Passive range of motion    PT Next  Visit Plan  Progress dynamic and static balance, LE functional strength    PT Home Exercise Plan  09/28/19:  Use cane at all times, slow down gait and improve sequence.  Improve postural awareness 5times day.  Seated rows with RTB. heel/toe raises.  Standing NBOS by counter/sink.    Consulted and Agree with Plan of Care  Patient       Patient will benefit from skilled therapeutic intervention in order to improve the following deficits and impairments:  Pain, Decreased range of motion, Decreased balance, Decreased coordination, Decreased activity tolerance, Abnormal gait, Decreased endurance, Decreased knowledge of precautions, Decreased strength, Difficulty walking  Visit Diagnosis: Muscle weakness (generalized)  Unsteadiness on feet  Difficulty in walking, not elsewhere classified     Problem List Patient Active Problem List   Diagnosis Date Noted  . GERD (gastroesophageal reflux disease)   . History of Nissen fundoplication   . IBS (irritable bowel syndrome) 02/05/2018  . Diarrhea 06/16/2017  . N&V (nausea and vomiting) 06/16/2017  . Numbness 08/18/2015  . Polyneuropathy 08/18/2015  . Menopausal syndrome (hot flushes) 06/15/2015  . Tremor, essential 06/15/2015  . Gastroesophageal reflux disease with esophagitis 06/15/2015  . Bilateral carpal tunnel syndrome 06/15/2015  . Resting tremor 06/15/2015  . Scoliosis of thoracic spine 11/05/2013  . Spinal stenosis, unspecified region other than cervical 12/03/2012  . Spondylosis of unspecified site without mention of myelopathy 12/03/2012  . Parkinson disease ([place])   . Back pain   . Seasonal allergies     1:45 PM, 09/30/19 Josue Hector PT DPT  Physical Therapist with [facility]  [phone]   [facility] [address] [place], [place], [address] Phone: [phone]   Fax:  [phone]  Name: IDORA BROSIOUS MRN: [id number] Date of Birth: 1934-04-09

## 2019-10-05 ENCOUNTER — Ambulatory Visit (HOSPITAL_COMMUNITY): Payer: Medicare Other

## 2019-10-05 ENCOUNTER — Telehealth (HOSPITAL_COMMUNITY): Payer: Self-pay

## 2019-10-05 NOTE — Telephone Encounter (Signed)
No show, called and left message concerning missed apt today.  Included next apt date and time with contact information given.  Also shared no show policy details in message.    Ihor Austin, LPTA/CLT; Delana Meyer [phone]

## 2019-10-07 ENCOUNTER — Other Ambulatory Visit: Payer: Self-pay

## 2019-10-07 ENCOUNTER — Encounter (HOSPITAL_COMMUNITY): Payer: Self-pay

## 2019-10-07 ENCOUNTER — Ambulatory Visit (HOSPITAL_COMMUNITY): Payer: Medicare Other

## 2019-10-07 DIAGNOSIS — R262 Difficulty in walking, not elsewhere classified: Secondary | ICD-10-CM

## 2019-10-07 DIAGNOSIS — R2681 Unsteadiness on feet: Secondary | ICD-10-CM | POA: Diagnosis not present

## 2019-10-07 DIAGNOSIS — M6281 Muscle weakness (generalized): Secondary | ICD-10-CM

## 2019-10-07 NOTE — Therapy (Signed)
[place], [place], [address] Phone: [phone]   Fax:  [phone]  Physical Therapy Treatment  Patient Details  Name: [name] MRN: [id number] Date of Birth: [birth year] Referring Provider (PT):  Ward Givens NP   Encounter Date: 10/07/2019  PT End of Session - 10/07/19 1419    Visit Number  4    Number of Visits  12    Date for PT Re-Evaluation  11/02/19    Authorization Type  UHC Medicare, no co-ins, Copay $35, No auth, PT includes AQ coverage, No VL    Progress Note Due on Visit  10    PT Start Time  1410   pt late for apt   PT Stop Time  1443    PT Time Calculation (min)  33 min    Equipment Utilized During Treatment  Gait belt    Activity Tolerance  Patient tolerated treatment well;Patient limited by fatigue    Behavior During Therapy  WFL for tasks assessed/performed       Past Medical History:  Diagnosis Date  . Ankle swelling    takes torsemide prn  . Anxiety   . Arthritis   . Asthma    allergy induced  . Back pain   . Complication of anesthesia   . Depression   . GERD (gastroesophageal reflux disease)   . Hypercholesterolemia   . Hypothyroid   . Parkinson disease ([place])   . PONV (postoperative nausea and vomiting)   . Seasonal allergies     Past Surgical History:  Procedure Laterality Date  . ABDOMINAL HYSTERECTOMY    . CARPAL TUNNEL RELEASE Right 10/26/2015   Procedure: CARPAL TUNNEL RELEASE;  Surgeon: Daryll Brod, MD;  Location: [facility];  Service: Orthopedics;  Laterality: Right;  . CARPOMETACARPEL SUSPENSION PLASTY Right 10/26/2015   Procedure: RIGHT HAND TRAPEZIECTOMY WITH THUMB METACARPEL SUSPENSION PLASTY;  Surgeon: Daryll Brod, MD;  Location: [facility];  Service: Orthopedics;  Laterality: Right;  . CATARACT EXTRACTION W/PHACO  12/23/2011   Procedure: CATARACT EXTRACTION PHACO AND INTRAOCULAR LENS PLACEMENT (IOC);  Surgeon: Williams Che,  MD;  Location: [facility];  Service: Ophthalmology;  Laterality: Right;  CDE=5.16  . CHOLECYSTECTOMY    . EYE SURGERY     cataract extraction of left eye-[facility]-2002  . KNEE ARTHROSCOPY     right  . NISSEN FUNDOPLICATION     [facility]  . TRIGGER FINGER RELEASE Right 10/26/2015   Procedure: RELEASE A-1 PULLEY RIGHT RING FINGER;  Surgeon: Daryll Brod, MD;  Location: [facility];  Service: Orthopedics;  Laterality: Right;    There were no vitals filed for this visit.  Subjective Assessment - 10/07/19 1411    Subjective  Pt stated she feels tired at entrace today, gave blood today and thinks that may be partial reason.    Pertinent History  parkinson's    Patient Stated Goals  to have less falls and more stamina    Currently in Pain?  No/denies                       [facility] Adult PT Treatment/Exercise - 10/07/19 0001      Exercises   Exercises  Knee/Hip      Knee/Hip Exercises: Standing   Heel Raises  Both;20 reps    Heel Raises Limitations  toe raise x20    Forward Step Up  Both;1 set;15 reps;Hand Hold: 1;Step Height: 4"  SLS  8" BLE max of 5    SLS with Vectors  3x5" BLE 1 HHA    Other Standing Knee Exercises  alternating toe tapping 2 sets 10 reps 6in step minimal HHA; sidestep 2RT    Other Standing Knee Exercises  3x 30 tandem stance                PT Short Term Goals - 09/21/19 1531      PT SHORT TERM GOAL #1   Title  Patient will be independent in self management strategies to improve quality of life and functional outcomes.    Time  3    Period  Weeks    Status  New    Target Date  10/12/19      PT SHORT TERM GOAL #2   Title  Patient will report at least 25% improvement in overall symptoms and/or functional ability.    Time  3    Period  Weeks    Status  New    Target Date  10/12/19        PT Long Term Goals - 09/21/19 1531      PT LONG TERM GOAL #1   Title  Patient will report at least 50% improvement in overall symptoms and/or  functional ability.    Time  6    Period  Weeks    Status  New    Target Date  11/02/19      PT LONG TERM GOAL #2   Title  Patient will report no falls in the last 4 weeks to demonstrate improved balance at home and awareness of fall risk.    Time  6    Period  Weeks    Status  New    Target Date  11/02/19      PT LONG TERM GOAL #3   Title  Patient will score with at least 46 points on BERG to decrease risk of falls from significant to moderate.    Baseline  current 42    Time  6    Period  Weeks    Status  New    Target Date  11/02/19            Plan - 10/07/19 1750    Clinical Impression Statement  Session focus wiht functional strengthening and balance training.  Pt improving awareness of posture with decreased cueing required though session.  Added vector stance for hip strengthening to assist wiht SLS.  Pt was limited by fatigue, no reports of pain.    Personal Factors and Comorbidities  Comorbidity 1;Comorbidity 2;Comorbidity 3+;Fitness    Comorbidities  parkingsons, history of falls, low back pain    Examination-Activity Limitations  Squat;Stairs;Stand;Transfers;Lift;Locomotion Level;Carry;Bend    Examination-Participation Restrictions  Cleaning;Meal Prep    Stability/Clinical Decision Making  Evolving/Moderate complexity    Clinical Decision Making  Moderate    Rehab Potential  Good    PT Frequency  2x / week    PT Duration  6 weeks    PT Treatment/Interventions  ADLs/Self Care Home Management;Aquatic Therapy;Cryotherapy;Electrical Stimulation;Moist Heat;Traction;Balance training;Therapeutic exercise;Therapeutic activities;Functional mobility training;Stair training;Gait training;DME Instruction;Neuromuscular re-education;Patient/family education;Manual techniques;Passive range of motion    PT Next Visit Plan  Progress dynamic and static balance, LE functional strength    PT Home Exercise Plan  09/28/19:  Use cane at all times, slow down gait and improve sequence.   Improve postural awareness 5times day.  Seated rows with RTB. heel/toe raises.  Standing NBOS by counter/sink.  Patient will benefit from skilled therapeutic intervention in order to improve the following deficits and impairments:  Pain, Decreased range of motion, Decreased balance, Decreased coordination, Decreased activity tolerance, Abnormal gait, Decreased endurance, Decreased knowledge of precautions, Decreased strength, Difficulty walking  Visit Diagnosis: Muscle weakness (generalized)  Unsteadiness on feet  Difficulty in walking, not elsewhere classified     Problem List Patient Active Problem List   Diagnosis Date Noted  . GERD (gastroesophageal reflux disease)   . History of Nissen fundoplication   . IBS (irritable bowel syndrome) 02/05/2018  . Diarrhea 06/16/2017  . N&V (nausea and vomiting) 06/16/2017  . Numbness 08/18/2015  . Polyneuropathy 08/18/2015  . Menopausal syndrome (hot flushes) 06/15/2015  . Tremor, essential 06/15/2015  . Gastroesophageal reflux disease with esophagitis 06/15/2015  . Bilateral carpal tunnel syndrome 06/15/2015  . Resting tremor 06/15/2015  . Scoliosis of thoracic spine 11/05/2013  . Spinal stenosis, unspecified region other than cervical 12/03/2012  . Spondylosis of unspecified site without mention of myelopathy 12/03/2012  . Parkinson disease ([place])   . Back pain   . Seasonal allergies    Ihor Austin, LPTA/CLT; CBIS [phone]  Aldona Lento 10/07/2019, 5:52 PM  [place], [place], [address] Phone: [phone]   Fax:  [phone]  Name: [name] MRN: [id number] Date of Birth: Jan 19, 1934

## 2019-10-12 ENCOUNTER — Ambulatory Visit (HOSPITAL_COMMUNITY): Payer: Medicare Other | Attending: Adult Health

## 2019-10-12 ENCOUNTER — Encounter (HOSPITAL_COMMUNITY): Payer: Self-pay

## 2019-10-12 ENCOUNTER — Other Ambulatory Visit: Payer: Self-pay

## 2019-10-12 DIAGNOSIS — M6281 Muscle weakness (generalized): Secondary | ICD-10-CM | POA: Diagnosis present

## 2019-10-12 DIAGNOSIS — R262 Difficulty in walking, not elsewhere classified: Secondary | ICD-10-CM | POA: Diagnosis present

## 2019-10-12 DIAGNOSIS — R2681 Unsteadiness on feet: Secondary | ICD-10-CM | POA: Diagnosis present

## 2019-10-12 DIAGNOSIS — R293 Abnormal posture: Secondary | ICD-10-CM | POA: Diagnosis present

## 2019-10-12 DIAGNOSIS — M542 Cervicalgia: Secondary | ICD-10-CM | POA: Insufficient documentation

## 2019-10-12 NOTE — Therapy (Signed)
[place], [place], [address] Phone: [phone]   Fax:  [phone]  Physical Therapy Treatment  Patient Details  Name: [name] MRN: [id number] Date of Birth: 1934/02/28 Referring Provider (PT):  Ward Givens NP   Encounter Date: 10/12/2019  PT End of Session - 10/12/19 1504    Visit Number  5    Number of Visits  12    Date for PT Re-Evaluation  11/02/19    Authorization Type  UHC Medicare, no co-ins, Copay $35, No auth, PT includes AQ coverage, No VL    Progress Note Due on Visit  10    PT Start Time  1455   pt late for apt   PT Stop Time  1533    PT Time Calculation (min)  38 min    Equipment Utilized During Treatment  Gait belt    Activity Tolerance  Patient tolerated treatment well;Patient limited by fatigue    Behavior During Therapy  WFL for tasks assessed/performed       Past Medical History:  Diagnosis Date  . Ankle swelling    takes torsemide prn  . Anxiety   . Arthritis   . Asthma    allergy induced  . Back pain   . Complication of anesthesia   . Depression   . GERD (gastroesophageal reflux disease)   . Hypercholesterolemia   . Hypothyroid   . Parkinson disease ([place])   . PONV (postoperative nausea and vomiting)   . Seasonal allergies     Past Surgical History:  Procedure Laterality Date  . ABDOMINAL HYSTERECTOMY    . CARPAL TUNNEL RELEASE Right 10/26/2015   Procedure: CARPAL TUNNEL RELEASE;  Surgeon: Daryll Brod, MD;  Location: [facility];  Service: Orthopedics;  Laterality: Right;  . CARPOMETACARPEL SUSPENSION PLASTY Right 10/26/2015   Procedure: RIGHT HAND TRAPEZIECTOMY WITH THUMB METACARPEL SUSPENSION PLASTY;  Surgeon: Daryll Brod, MD;  Location: [facility];  Service: Orthopedics;  Laterality: Right;  . CATARACT EXTRACTION W/PHACO  12/23/2011   Procedure: CATARACT EXTRACTION PHACO AND INTRAOCULAR LENS PLACEMENT (IOC);  Surgeon: Williams Che, MD;   [facility];  Service: Ophthalmology;  Laterality: Right;  CDE=5.16  . CHOLECYSTECTOMY    . EYE SURGERY     cataract extraction of left eye-[facility]-2002  . KNEE ARTHROSCOPY     right  . NISSEN FUNDOPLICATION     [facility]  . TRIGGER FINGER RELEASE Right 10/26/2015   Procedure: RELEASE A-1 PULLEY RIGHT RING FINGER;  Surgeon: Daryll Brod, MD;  Location: [facility];  Service: Orthopedics;  Laterality: Right;    There were no vitals filed for this visit.  Subjective Assessment - 10/12/19 1458    Subjective  Pt reports she was walking in her yard and fell on a cat, hit her head with scar on forehead.  Pain scale 4/10 forehead.    Patient Stated Goals  to have less falls and more stamina    Currently in Pain?  Yes    Pain Score  4     Pain Location  Face   forehead   Pain Descriptors / Indicators  Sore;Aching;Dull    Pain Type  Acute pain   incision on forehead from fall                      [facility] Adult PT Treatment/Exercise - 10/12/19 0001      Exercises   Exercises  Knee/Hip      Knee/Hip Exercises: Standing   Heel Raises  10 reps    Heel Raises Limitations  heel raise with wall arch 3"    Stairs  2RT reciprocal pattern 7in step height    Gait Training  DGI change in speed and stop/go, turn around    Other Standing Knee Exercises  alternating toe tapping 6in step forward and lateral stepping 20x each;     Other Standing Knee Exercises  2x 30" tandem stance, reaching forward 5RT; stepping stragety for recovery          Balance Exercises - 10/12/19 1546      Balance Exercises: Standing   Step Over Hurdles / [place]  6in alternating 3RT, pause for balance between each hurdle          PT Short Term Goals - 09/21/19 1531      PT SHORT TERM GOAL #1   Title  Patient will be independent in self management strategies to improve quality of life and functional outcomes.    Time  3    Period  Weeks    Status  New    Target Date  10/12/19      PT  SHORT TERM GOAL #2   Title  Patient will report at least 25% improvement in overall symptoms and/or functional ability.    Time  3    Period  Weeks    Status  New    Target Date  10/12/19        PT Long Term Goals - 09/21/19 1531      PT LONG TERM GOAL #1   Title  Patient will report at least 50% improvement in overall symptoms and/or functional ability.    Time  6    Period  Weeks    Status  New    Target Date  11/02/19      PT LONG TERM GOAL #2   Title  Patient will report no falls in the last 4 weeks to demonstrate improved balance at home and awareness of fall risk.    Time  6    Period  Weeks    Status  New    Target Date  11/02/19      PT LONG TERM GOAL #3   Title  Patient will score with at least 46 points on BERG to decrease risk of falls from significant to moderate.    Baseline  current 42    Time  6    Period  Weeks    Status  New    Target Date  11/02/19            Plan - 10/12/19 1514    Clinical Impression Statement  Pt strongly encouraged to continues ambulating wiht AD.  Added DGI activities to improve ability to change speed with gait and ability to stop with obstables as needed.  Reminded next apt date and encouraged pt to arrive on time.    Personal Factors and Comorbidities  Comorbidity 1;Comorbidity 2;Comorbidity 3+;Fitness    Comorbidities  parkingsons, history of falls, low back pain    Examination-Activity Limitations  Squat;Stairs;Stand;Transfers;Lift;Locomotion Level;Carry;Bend    Examination-Participation Restrictions  Cleaning;Meal Prep    Stability/Clinical Decision Making  Evolving/Moderate complexity    Clinical Decision Making  Moderate    Rehab Potential  Good    PT Frequency  2x / week    PT Duration  6 weeks    PT Treatment/Interventions  ADLs/Self Care Home Management;Aquatic  Therapy;Cryotherapy;Electrical Stimulation;Moist Heat;Traction;Balance training;Therapeutic exercise;Therapeutic activities;Functional mobility  training;Stair training;Gait training;DME Instruction;Neuromuscular re-education;Patient/family education;Manual techniques;Passive range of motion    PT Next Visit Plan  Continue step stragies for fall prevention.  Progress dynamic and static balance, LE functional strength    PT Home Exercise Plan  09/28/19:  Use cane at all times, slow down gait and improve sequence.  Improve postural awareness 5times day.  Seated rows with RTB. heel/toe raises.  Standing NBOS by counter/sink.       Patient will benefit from skilled therapeutic intervention in order to improve the following deficits and impairments:  Pain, Decreased range of motion, Decreased balance, Decreased coordination, Decreased activity tolerance, Abnormal gait, Decreased endurance, Decreased knowledge of precautions, Decreased strength, Difficulty walking  Visit Diagnosis: Muscle weakness (generalized)  Unsteadiness on feet  Difficulty in walking, not elsewhere classified     Problem List Patient Active Problem List   Diagnosis Date Noted  . GERD (gastroesophageal reflux disease)   . History of Nissen fundoplication   . IBS (irritable bowel syndrome) 02/05/2018  . Diarrhea 06/16/2017  . N&V (nausea and vomiting) 06/16/2017  . Numbness 08/18/2015  . Polyneuropathy 08/18/2015  . Menopausal syndrome (hot flushes) 06/15/2015  . Tremor, essential 06/15/2015  . Gastroesophageal reflux disease with esophagitis 06/15/2015  . Bilateral carpal tunnel syndrome 06/15/2015  . Resting tremor 06/15/2015  . Scoliosis of thoracic spine 11/05/2013  . Spinal stenosis, unspecified region other than cervical 12/03/2012  . Spondylosis of unspecified site without mention of myelopathy 12/03/2012  . Parkinson disease ([place])   . Back pain   . Seasonal allergies    Ihor Austin, LPTA/CLT; CBIS [phone]  Aldona Lento 10/12/2019, 3:59 PM  [place] [address] [place],  [place], [address] Phone: [phone]   Fax:  [phone]  Name: [name] MRN: [id number] Date of Birth: [birth year]

## 2019-10-14 ENCOUNTER — Other Ambulatory Visit: Payer: Self-pay | Admitting: Gastroenterology

## 2019-10-14 ENCOUNTER — Ambulatory Visit (HOSPITAL_COMMUNITY): Payer: Medicare Other

## 2019-10-15 ENCOUNTER — Encounter (HOSPITAL_COMMUNITY): Payer: Self-pay

## 2019-10-15 ENCOUNTER — Ambulatory Visit (HOSPITAL_COMMUNITY): Payer: Medicare Other

## 2019-10-15 ENCOUNTER — Other Ambulatory Visit: Payer: Self-pay

## 2019-10-15 DIAGNOSIS — M6281 Muscle weakness (generalized): Secondary | ICD-10-CM

## 2019-10-15 DIAGNOSIS — R262 Difficulty in walking, not elsewhere classified: Secondary | ICD-10-CM

## 2019-10-15 DIAGNOSIS — R2681 Unsteadiness on feet: Secondary | ICD-10-CM

## 2019-10-15 NOTE — Therapy (Addendum)
[facility] [place], [place], [address] Phone: [phone]   Fax:  [phone]  Physical Therapy Treatment  Patient Details  Name: [name] MRN: [id number] Date of Birth: [birth year] Referring Provider (PT):  Ward Givens NP   Encounter Date: 10/15/2019  PT End of Session - 10/15/19 1011    Visit Number  6    Number of Visits  12    Date for PT Re-Evaluation  11/02/19    Authorization Type  UHC Medicare, no co-ins, Copay $35, No auth, PT includes AQ coverage, No VL    Progress Note Due on Visit  10    PT Start Time  1010    PT Stop Time  1048    PT Time Calculation (min)  38 min    Equipment Utilized During Treatment  Gait belt    Activity Tolerance  Patient tolerated treatment well;Patient limited by fatigue    Behavior During Therapy  WFL for tasks assessed/performed       Past Medical History:  Diagnosis Date  . Ankle swelling    takes torsemide prn  . Anxiety   . Arthritis   . Asthma    allergy induced  . Back pain   . Complication of anesthesia   . Depression   . GERD (gastroesophageal reflux disease)   . Hypercholesterolemia   . Hypothyroid   . Parkinson disease ([place])   . PONV (postoperative nausea and vomiting)   . Seasonal allergies     Past Surgical History:  Procedure Laterality Date  . ABDOMINAL HYSTERECTOMY    . CARPAL TUNNEL RELEASE Right 10/26/2015   Procedure: CARPAL TUNNEL RELEASE;  Surgeon: Daryll Brod, MD;  Location: [facility];  Service: Orthopedics;  Laterality: Right;  . CARPOMETACARPEL SUSPENSION PLASTY Right 10/26/2015   Procedure: RIGHT HAND TRAPEZIECTOMY WITH THUMB METACARPEL SUSPENSION PLASTY;  Surgeon: Daryll Brod, MD;  Location: [facility];  Service: Orthopedics;  Laterality: Right;  . CATARACT EXTRACTION W/PHACO  12/23/2011   Procedure: CATARACT EXTRACTION PHACO AND INTRAOCULAR LENS PLACEMENT (IOC);  Surgeon: Williams Che, MD;  [facility];  Service: Ophthalmology;  Laterality: Right;  CDE=5.16  . CHOLECYSTECTOMY    . EYE SURGERY     cataract extraction of left eye-[facility]-2002  . KNEE ARTHROSCOPY     right  . NISSEN FUNDOPLICATION     [facility]  . TRIGGER FINGER RELEASE Right 10/26/2015   Procedure: RELEASE A-1 PULLEY RIGHT RING FINGER;  Surgeon: Daryll Brod, MD;  Location: [facility];  Service: Orthopedics;  Laterality: Right;    There were no vitals filed for this visit.  Subjective Assessment - 10/15/19 1010    Subjective  Pt reports she has soreness on her head and Rt knee following fall.  Has been using her cane more frequently.    Pertinent History  parkinson's    Patient Stated Goals  to have less falls and more stamina    Currently in Pain?  No/denies    Pain Descriptors / Indicators  Sore                       [facility] Adult PT Treatment/Exercise - 10/15/19 0001      Knee/Hip Exercises: Standing   Heel Raises  10 reps    Heel Raises Limitations  heel raise with wall arch 3"    SLS with Vectors  3x5" BLE 1 HHA    Gait  Training  DGI change in speed and stop/go, turn around, head turns trying not to change speed    Other Standing Knee Exercises  alternating toe tapping 6in step forward and lateral stepping 20x each;     Other Standing Knee Exercises  warrior vpose 2x30" holds               PT Short Term Goals - 09/21/19 1531      PT SHORT TERM GOAL #1   Title  Patient will be independent in self management strategies to improve quality of life and functional outcomes.    Time  3    Period  Weeks    Status  New    Target Date  10/12/19      PT SHORT TERM GOAL #2   Title  Patient will report at least 25% improvement in overall symptoms and/or functional ability.    Time  3    Period  Weeks    Status  New    Target Date  10/12/19        PT Long Term Goals - 09/21/19 1531      PT LONG TERM GOAL #1   Title  Patient will report at least 50% improvement in overall  symptoms and/or functional ability.    Time  6    Period  Weeks    Status  New    Target Date  11/02/19      PT LONG TERM GOAL #2   Title  Patient will report no falls in the last 4 weeks to demonstrate improved balance at home and awareness of fall risk.    Time  6    Period  Weeks    Status  New    Target Date  11/02/19      PT LONG TERM GOAL #3   Title  Patient will score with at least 46 points on BERG to decrease risk of falls from significant to moderate.    Baseline  current 42    Time  6    Period  Weeks    Status  New    Target Date  11/02/19            Plan - 10/15/19 1048    Clinical Impression Statement  Continued DGI activities to improve reaction timing with obstacles to reduce risk of falls.  Pt presents with improved posture through session with less cueing required to look up and improvements with stop/go times.  Required min A wiht head turns and increased gait speed with stop/go.  Add warrior poses for static balance iwht min guard required.    Personal Factors and Comorbidities  Comorbidity 1;Comorbidity 2;Comorbidity 3+;Fitness    Comorbidities  parkingsons, history of falls, low back pain    Examination-Activity Limitations  Squat;Stairs;Stand;Transfers;Lift;Locomotion Level;Carry;Bend    Examination-Participation Restrictions  Cleaning;Meal Prep    Stability/Clinical Decision Making  Evolving/Moderate complexity    Clinical Decision Making  Moderate    Rehab Potential  Good    PT Frequency  2x / week    PT Duration  6 weeks    PT Treatment/Interventions  ADLs/Self Care Home Management;Aquatic Therapy;Cryotherapy;Electrical Stimulation;Moist Heat;Traction;Balance training;Therapeutic exercise;Therapeutic activities;Functional mobility training;Stair training;Gait training;DME Instruction;Neuromuscular re-education;Patient/family education;Manual techniques;Passive range of motion    PT Next Visit Plan  Continue step stragies for fall prevention.  Progress  dynamic and static balance, LE functional strength    PT Home Exercise Plan  09/28/19:  Use cane at all times, slow down gait and  improve sequence.  Improve postural awareness 5times day.  Seated rows with RTB. heel/toe raises.  Standing NBOS by counter/sink.       Patient will benefit from skilled therapeutic intervention in order to improve the following deficits and impairments:  Pain, Decreased range of motion, Decreased balance, Decreased coordination, Decreased activity tolerance, Abnormal gait, Decreased endurance, Decreased knowledge of precautions, Decreased strength, Difficulty walking  Visit Diagnosis: Muscle weakness (generalized)  Unsteadiness on feet  Difficulty in walking, not elsewhere classified     Problem List Patient Active Problem List   Diagnosis Date Noted  . GERD (gastroesophageal reflux disease)   . History of Nissen fundoplication   . IBS (irritable bowel syndrome) 02/05/2018  . Diarrhea 06/16/2017  . N&V (nausea and vomiting) 06/16/2017  . Numbness 08/18/2015  . Polyneuropathy 08/18/2015  . Menopausal syndrome (hot flushes) 06/15/2015  . Tremor, essential 06/15/2015  . Gastroesophageal reflux disease with esophagitis 06/15/2015  . Bilateral carpal tunnel syndrome 06/15/2015  . Resting tremor 06/15/2015  . Scoliosis of thoracic spine 11/05/2013  . Spinal stenosis, unspecified region other than cervical 12/03/2012  . Spondylosis of unspecified site without mention of myelopathy 12/03/2012  . Parkinson disease ([place])   . Back pain   . Seasonal allergies    Ihor Austin, LPTA/CLT; CBIS [phone]  Aldona Lento 10/15/2019, 11:34 AM  [facility] [address] [place], [place], [address] Phone: [phone]   Fax:  [phone]  Name: [name] MRN: [id number] Date of Birth: 04/23/1934

## 2019-10-19 ENCOUNTER — Telehealth (HOSPITAL_COMMUNITY): Payer: Self-pay | Admitting: Physical Therapy

## 2019-10-19 ENCOUNTER — Ambulatory Visit (HOSPITAL_COMMUNITY): Payer: Medicare Other | Admitting: Physical Therapy

## 2019-10-19 NOTE — Telephone Encounter (Signed)
pt cancelled appt today because she has a conflict in her schedule

## 2019-10-21 ENCOUNTER — Ambulatory Visit (HOSPITAL_COMMUNITY): Payer: Medicare Other | Admitting: Physical Therapy

## 2019-10-21 ENCOUNTER — Other Ambulatory Visit: Payer: Self-pay

## 2019-10-21 ENCOUNTER — Encounter (HOSPITAL_COMMUNITY): Payer: Self-pay | Admitting: Physical Therapy

## 2019-10-21 DIAGNOSIS — M6281 Muscle weakness (generalized): Secondary | ICD-10-CM

## 2019-10-21 DIAGNOSIS — R2681 Unsteadiness on feet: Secondary | ICD-10-CM

## 2019-10-21 DIAGNOSIS — R262 Difficulty in walking, not elsewhere classified: Secondary | ICD-10-CM

## 2019-10-21 NOTE — Therapy (Signed)
[facility] [place], [place], [address] Phone: [phone]   Fax:  [phone]  Physical Therapy Treatment  Patient Details  Name: [name] MRN: [id number] Date of Birth: [birth year] Referring Provider (PT):  Ward Givens NP   Encounter Date: 10/21/2019  PT End of Session - 10/21/19 1442    Visit Number  7    Number of Visits  12    Date for PT Re-Evaluation  11/02/19    Authorization Type  UHC Medicare, no co-ins, Copay $35, No auth, PT includes AQ coverage, No VL    Progress Note Due on Visit  10    PT Start Time  1438    PT Stop Time  1516    PT Time Calculation (min)  38 min    Equipment Utilized During Treatment  Gait belt    Activity Tolerance  Patient tolerated treatment well;Patient limited by fatigue    Behavior During Therapy  WFL for tasks assessed/performed       Past Medical History:  Diagnosis Date  . Ankle swelling    takes torsemide prn  . Anxiety   . Arthritis   . Asthma    allergy induced  . Back pain   . Complication of anesthesia   . Depression   . GERD (gastroesophageal reflux disease)   . Hypercholesterolemia   . Hypothyroid   . Parkinson disease ([place])   . PONV (postoperative nausea and vomiting)   . Seasonal allergies     Past Surgical History:  Procedure Laterality Date  . ABDOMINAL HYSTERECTOMY    . CARPAL TUNNEL RELEASE Right 10/26/2015   Procedure: CARPAL TUNNEL RELEASE;  Surgeon: Daryll Brod, MD;  Location: [facility];  Service: Orthopedics;  Laterality: Right;  . CARPOMETACARPEL SUSPENSION PLASTY Right 10/26/2015   Procedure: RIGHT HAND TRAPEZIECTOMY WITH THUMB METACARPEL SUSPENSION PLASTY;  Surgeon: Daryll Brod, MD;  Location: [facility];  Service: Orthopedics;  Laterality: Right;  . CATARACT EXTRACTION W/PHACO  12/23/2011   Procedure: CATARACT EXTRACTION PHACO AND INTRAOCULAR LENS PLACEMENT (IOC);  Surgeon: Williams Che, MD;  [facility];  Service: Ophthalmology;  Laterality: Right;  CDE=5.16  . CHOLECYSTECTOMY    . EYE SURGERY     cataract extraction of left eye-[facility]-2002  . KNEE ARTHROSCOPY     right  . NISSEN FUNDOPLICATION     [facility]  . TRIGGER FINGER RELEASE Right 10/26/2015   Procedure: RELEASE A-1 PULLEY RIGHT RING FINGER;  Surgeon: Daryll Brod, MD;  Location: [facility];  Service: Orthopedics;  Laterality: Right;    There were no vitals filed for this visit.  Subjective Assessment - 10/21/19 1441    Subjective  Patient denied any pain. Reports her biggest issue is with walking currently.    Pertinent History  parkinson's    Patient Stated Goals  to have less falls and more stamina    Currently in Pain?  No/denies                        [facility] Adult PT Treatment/Exercise - 10/21/19 0001      Knee/Hip Exercises: Standing   Heel Raises  15 reps;2 sets    Heel Raises Limitations  second set with heel raise with wall arch 3"    Gait Training  Sidestepping in // bars without UE assist x 3 RT. DGI change in speed and stop/go, turn around, head turns trying not  to change speed    Other Standing Knee Exercises  Stepping strategies forward and backward x10 each LE. Turning to place cones on counter 180 degrees behind 5x each direction. alternating toe tapping 6in step forward 3x10. Lateral step ups 10x onto 6'' step.     Other Standing Knee Exercises  warrior I pose 2x30" holds               PT Short Term Goals - 09/21/19 1531      PT SHORT TERM GOAL #1   Title  Patient will be independent in self management strategies to improve quality of life and functional outcomes.    Time  3    Period  Weeks    Status  New    Target Date  10/12/19      PT SHORT TERM GOAL #2   Title  Patient will report at least 25% improvement in overall symptoms and/or functional ability.    Time  3    Period  Weeks    Status  New    Target Date  10/12/19        PT Long Term Goals -  09/21/19 1531      PT LONG TERM GOAL #1   Title  Patient will report at least 50% improvement in overall symptoms and/or functional ability.    Time  6    Period  Weeks    Status  New    Target Date  11/02/19      PT LONG TERM GOAL #2   Title  Patient will report no falls in the last 4 weeks to demonstrate improved balance at home and awareness of fall risk.    Time  6    Period  Weeks    Status  New    Target Date  11/02/19      PT LONG TERM GOAL #3   Title  Patient will score with at least 46 points on BERG to decrease risk of falls from significant to moderate.    Baseline  current 42    Time  6    Period  Weeks    Status  New    Target Date  11/02/19            Plan - 10/21/19 1534    Clinical Impression Statement  Focused on balance exercises both static and dynamic this session. Added cone rotation 180 degrees to place a cone on a surface behind the patient. Patient with some unsteadiness with changes in gait speed and intermittently with stepping strategy practice this session. Provided cueing to improve posture and improve safety.    Personal Factors and Comorbidities  Comorbidity 1;Comorbidity 2;Comorbidity 3+;Fitness    Comorbidities  parkingsons, history of falls, low back pain    Examination-Activity Limitations  Squat;Stairs;Stand;Transfers;Lift;Locomotion Level;Carry;Bend    Examination-Participation Restrictions  Cleaning;Meal Prep    Stability/Clinical Decision Making  Evolving/Moderate complexity    Rehab Potential  Good    PT Frequency  2x / week    PT Duration  6 weeks    PT Treatment/Interventions  ADLs/Self Care Home Management;Aquatic Therapy;Cryotherapy;Electrical Stimulation;Moist Heat;Traction;Balance training;Therapeutic exercise;Therapeutic activities;Functional mobility training;Stair training;Gait training;DME Instruction;Neuromuscular re-education;Patient/family education;Manual techniques;Passive range of motion    PT Next Visit Plan  Continue  step stragies for fall prevention.  Progress dynamic and static balance, LE functional strength    PT Home Exercise Plan  09/28/19:  Use cane at all times, slow down gait and improve sequence.  Improve postural awareness  5times day.  Seated rows with RTB. heel/toe raises.  Standing NBOS by counter/sink.       Patient will benefit from skilled therapeutic intervention in order to improve the following deficits and impairments:  Pain, Decreased range of motion, Decreased balance, Decreased coordination, Decreased activity tolerance, Abnormal gait, Decreased endurance, Decreased knowledge of precautions, Decreased strength, Difficulty walking  Visit Diagnosis: Unsteadiness on feet  Muscle weakness (generalized)  Difficulty in walking, not elsewhere classified     Problem List Patient Active Problem List   Diagnosis Date Noted  . GERD (gastroesophageal reflux disease)   . History of Nissen fundoplication   . IBS (irritable bowel syndrome) 02/05/2018  . Diarrhea 06/16/2017  . N&V (nausea and vomiting) 06/16/2017  . Numbness 08/18/2015  . Polyneuropathy 08/18/2015  . Menopausal syndrome (hot flushes) 06/15/2015  . Tremor, essential 06/15/2015  . Gastroesophageal reflux disease with esophagitis 06/15/2015  . Bilateral carpal tunnel syndrome 06/15/2015  . Resting tremor 06/15/2015  . Scoliosis of thoracic spine 11/05/2013  . Spinal stenosis, unspecified region other than cervical 12/03/2012  . Spondylosis of unspecified site without mention of myelopathy 12/03/2012  . Parkinson disease ([place])   . Back pain   . Seasonal allergies    Clarene Critchley PT, DPT 3:39 PM, 10/21/19 [facility] [place], [place], [address] Phone: [phone]   Fax:  [phone]  Name: [name] MRN: [id number] Date of Birth: Oct 27, 1933

## 2019-10-26 ENCOUNTER — Ambulatory Visit (HOSPITAL_COMMUNITY): Payer: Medicare Other | Admitting: Physical Therapy

## 2019-10-26 ENCOUNTER — Encounter (HOSPITAL_COMMUNITY): Payer: Self-pay | Admitting: Physical Therapy

## 2019-10-26 ENCOUNTER — Other Ambulatory Visit: Payer: Self-pay

## 2019-10-26 DIAGNOSIS — R262 Difficulty in walking, not elsewhere classified: Secondary | ICD-10-CM

## 2019-10-26 DIAGNOSIS — R2681 Unsteadiness on feet: Secondary | ICD-10-CM

## 2019-10-26 DIAGNOSIS — M6281 Muscle weakness (generalized): Secondary | ICD-10-CM

## 2019-10-26 NOTE — Therapy (Signed)
[facility] [address] [place], [place], [address] Phone: [phone]   Fax:  [phone]  Physical Therapy Treatment  Patient Details  Name: [name] MRN: [id number] Date of Birth: 09-18-1933 Referring Provider (PT):  Ward Givens NP   Encounter Date: 10/26/2019  PT End of Session - 10/26/19 1327    Visit Number  8    Number of Visits  12    Date for PT Re-Evaluation  11/02/19    Authorization Type  UHC Medicare, no co-ins, Copay $35, No auth, PT includes AQ coverage, No VL    Progress Note Due on Visit  10    PT Start Time  1328   pt late to session (13 minutes)   PT Stop Time  1355    PT Time Calculation (min)  27 min    Equipment Utilized During Treatment  Gait belt    Activity Tolerance  Patient tolerated treatment well;Patient limited by fatigue    Behavior During Therapy  WFL for tasks assessed/performed       Past Medical History:  Diagnosis Date  . Ankle swelling    takes torsemide prn  . Anxiety   . Arthritis   . Asthma    allergy induced  . Back pain   . Complication of anesthesia   . Depression   . GERD (gastroesophageal reflux disease)   . Hypercholesterolemia   . Hypothyroid   . Parkinson disease ([place])   . PONV (postoperative nausea and vomiting)   . Seasonal allergies     Past Surgical History:  Procedure Laterality Date  . ABDOMINAL HYSTERECTOMY    . CARPAL TUNNEL RELEASE Right 10/26/2015   Procedure: CARPAL TUNNEL RELEASE;  Surgeon: Daryll Brod, MD;  Location: [facility];  Service: Orthopedics;  Laterality: Right;  . CARPOMETACARPEL SUSPENSION PLASTY Right 10/26/2015   Procedure: RIGHT HAND TRAPEZIECTOMY WITH THUMB METACARPEL SUSPENSION PLASTY;  Surgeon: Daryll Brod, MD;  Location: [facility];  Service: Orthopedics;  Laterality: Right;  . CATARACT EXTRACTION W/PHACO  12/23/2011   Procedure: CATARACT EXTRACTION PHACO AND INTRAOCULAR LENS PLACEMENT (IOC);  Surgeon:  Williams Che, MD;  [facility];  Service: Ophthalmology;  Laterality: Right;  CDE=5.16  . CHOLECYSTECTOMY    . EYE SURGERY     cataract extraction of left eye-[facility]-2002  . KNEE ARTHROSCOPY     right  . NISSEN FUNDOPLICATION     [facility]  . TRIGGER FINGER RELEASE Right 10/26/2015   Procedure: RELEASE A-1 PULLEY RIGHT RING FINGER;  Surgeon: Daryll Brod, MD;  Location: [facility];  Service: Orthopedics;  Laterality: Right;    There were no vitals filed for this visit.  Subjective Assessment - 10/26/19 1329    Subjective  States her stomach is bothering her and her neck is about 5/10 pain currently. States that her balance is OK and that walking is doing OK.    Pertinent History  parkinson's    Patient Stated Goals  to have less falls and more stamina    Currently in Pain?  Yes    Pain Score  5     Pain Location  Neck    Pain Orientation  Left    Pain Descriptors / Indicators  Discomfort         [facility] PT Assessment - 10/26/19 0001      Assessment   Medical Diagnosis  balance deficits    Referring Provider (PT)   Ward Givens NP  [facility] Adult PT Treatment/Exercise - 10/26/19 0001      Ambulation/Gait   Ambulation/Gait  Yes    Ambulation/Gait Assistance  6: Modified independent (Device/Increase time)    Ambulation Distance (Feet)  226 Feet    Assistive device  Straight cane    Gait Pattern  Decreased hip/knee flexion - right;Decreased hip/knee flexion - left;Trunk flexed;Narrow base of support    Gait Comments  in 1:27      Knee/Hip Exercises: Standing   Lateral Step Up  Both;2 sets;Hand Hold: 1;Step Height: 4";10 reps    Forward Step Up  10 reps;Hand Hold: 1;Step Height: 4";Both    Other Standing Knee Exercises  backwards walking 4x5 B CGA with cane;foot taps on cones 3 2x5 Each cone and each leg with cane and CGA     Other Standing Knee Exercises  fwd reaches to cones L/R/C - 4x5 B                PT Short Term  Goals - 09/21/19 1531      PT SHORT TERM GOAL #1   Title  Patient will be independent in self management strategies to improve quality of life and functional outcomes.    Time  3    Period  Weeks    Status  New    Target Date  10/12/19      PT SHORT TERM GOAL #2   Title  Patient will report at least 25% improvement in overall symptoms and/or functional ability.    Time  3    Period  Weeks    Status  New    Target Date  10/12/19        PT Long Term Goals - 09/21/19 1531      PT LONG TERM GOAL #1   Title  Patient will report at least 50% improvement in overall symptoms and/or functional ability.    Time  6    Period  Weeks    Status  New    Target Date  11/02/19      PT LONG TERM GOAL #2   Title  Patient will report no falls in the last 4 weeks to demonstrate improved balance at home and awareness of fall risk.    Time  6    Period  Weeks    Status  New    Target Date  11/02/19      PT LONG TERM GOAL #3   Title  Patient will score with at least 46 points on BERG to decrease risk of falls from significant to moderate.    Baseline  current 42    Time  6    Period  Weeks    Status  New    Target Date  11/02/19            Plan - 10/26/19 1331    Clinical Impression Statement  Focused on static balance and walking today. Fatigue noted but no increase in symptoms. Session limited secondary to patient arriving late to session. Will continue to focus on balance and stepping strategies as tolerated.    Personal Factors and Comorbidities  Comorbidity 1;Comorbidity 2;Comorbidity 3+;Fitness    Comorbidities  parkingsons, history of falls, low back pain    Examination-Activity Limitations  Squat;Stairs;Stand;Transfers;Lift;Locomotion Level;Carry;Bend    Examination-Participation Restrictions  Cleaning;Meal Prep    Stability/Clinical Decision Making  Evolving/Moderate complexity    Rehab Potential  Good    PT Frequency  2x / week    PT Duration  6 weeks    PT  Treatment/Interventions  ADLs/Self Care Home Management;Aquatic Therapy;Cryotherapy;Electrical Stimulation;Moist Heat;Traction;Balance training;Therapeutic exercise;Therapeutic activities;Functional mobility training;Stair training;Gait training;DME Instruction;Neuromuscular re-education;Patient/family education;Manual techniques;Passive range of motion    PT Next Visit Plan  Continue step stragies for fall prevention.  Progress dynamic and static balance, LE functional strength    PT Home Exercise Plan  09/28/19:  Use cane at all times, slow down gait and improve sequence.  Improve postural awareness 5times day.  Seated rows with RTB. heel/toe raises.  Standing NBOS by counter/sink.       Patient will benefit from skilled therapeutic intervention in order to improve the following deficits and impairments:  Pain, Decreased range of motion, Decreased balance, Decreased coordination, Decreased activity tolerance, Abnormal gait, Decreased endurance, Decreased knowledge of precautions, Decreased strength, Difficulty walking  Visit Diagnosis: Unsteadiness on feet  Muscle weakness (generalized)  Difficulty in walking, not elsewhere classified     Problem List Patient Active Problem List   Diagnosis Date Noted  . GERD (gastroesophageal reflux disease)   . History of Nissen fundoplication   . IBS (irritable bowel syndrome) 02/05/2018  . Diarrhea 06/16/2017  . N&V (nausea and vomiting) 06/16/2017  . Numbness 08/18/2015  . Polyneuropathy 08/18/2015  . Menopausal syndrome (hot flushes) 06/15/2015  . Tremor, essential 06/15/2015  . Gastroesophageal reflux disease with esophagitis 06/15/2015  . Bilateral carpal tunnel syndrome 06/15/2015  . Resting tremor 06/15/2015  . Scoliosis of thoracic spine 11/05/2013  . Spinal stenosis, unspecified region other than cervical 12/03/2012  . Spondylosis of unspecified site without mention of myelopathy 12/03/2012  . Parkinson disease ([place])   . Back pain    . Seasonal allergies     2:01 PM, 10/26/19 Jerene Pitch, DPT Physical Therapy with [facility]  [phone] office  [facility] [address] [place], [place], [address] Phone: [phone]   Fax:  [phone]  Name: LASHINA MILLES MRN: [id number] Date of Birth: 1934/02/27

## 2019-10-28 ENCOUNTER — Ambulatory Visit (HOSPITAL_COMMUNITY): Payer: Medicare Other | Admitting: Physical Therapy

## 2019-10-28 ENCOUNTER — Encounter (HOSPITAL_COMMUNITY): Payer: Self-pay | Admitting: Physical Therapy

## 2019-10-28 ENCOUNTER — Other Ambulatory Visit: Payer: Self-pay

## 2019-10-28 DIAGNOSIS — M6281 Muscle weakness (generalized): Secondary | ICD-10-CM | POA: Diagnosis not present

## 2019-10-28 DIAGNOSIS — R262 Difficulty in walking, not elsewhere classified: Secondary | ICD-10-CM

## 2019-10-28 DIAGNOSIS — R2681 Unsteadiness on feet: Secondary | ICD-10-CM

## 2019-10-28 NOTE — Therapy (Signed)
[facility] [address] [place], [place], [address] Phone: [phone]   Fax:  [phone]  Physical Therapy Treatment  Patient Details  Name: [name] MRN: [id number] Date of Birth: 12-09-1933 Referring Provider (PT):  Ward Givens NP   Encounter Date: 10/28/2019  PT End of Session - 10/28/19 1326    Visit Number  9    Number of Visits  12    Date for PT Re-Evaluation  11/02/19    Authorization Type  UHC Medicare, no co-ins, Copay $35, No auth, PT includes AQ coverage, No VL    Progress Note Due on Visit  10    PT Start Time  1326   11 minutes late to session   PT Stop Time  1400    PT Time Calculation (min)  34 min    Equipment Utilized During Treatment  Gait belt    Activity Tolerance  Patient tolerated treatment well;Patient limited by fatigue    Behavior During Therapy  WFL for tasks assessed/performed       Past Medical History:  Diagnosis Date  . Ankle swelling    takes torsemide prn  . Anxiety   . Arthritis   . Asthma    allergy induced  . Back pain   . Complication of anesthesia   . Depression   . GERD (gastroesophageal reflux disease)   . Hypercholesterolemia   . Hypothyroid   . Parkinson disease ([place])   . PONV (postoperative nausea and vomiting)   . Seasonal allergies     Past Surgical History:  Procedure Laterality Date  . ABDOMINAL HYSTERECTOMY    . CARPAL TUNNEL RELEASE Right 10/26/2015   Procedure: CARPAL TUNNEL RELEASE;  Surgeon: Daryll Brod, MD;  Location: [facility];  Service: Orthopedics;  Laterality: Right;  . CARPOMETACARPEL SUSPENSION PLASTY Right 10/26/2015   Procedure: RIGHT HAND TRAPEZIECTOMY WITH THUMB METACARPEL SUSPENSION PLASTY;  Surgeon: Daryll Brod, MD;  Location: [facility];  Service: Orthopedics;  Laterality: Right;  . CATARACT EXTRACTION W/PHACO  12/23/2011   Procedure: CATARACT EXTRACTION PHACO AND INTRAOCULAR LENS PLACEMENT (IOC);  Surgeon: Williams Che, MD;  Location: [facility];  Service: Ophthalmology;  Laterality: Right;  CDE=5.16  . CHOLECYSTECTOMY    . EYE SURGERY     cataract extraction of left eye-[facility]-2002  . KNEE ARTHROSCOPY     right  . NISSEN FUNDOPLICATION     [facility]  . TRIGGER FINGER RELEASE Right 10/26/2015   Procedure: RELEASE A-1 PULLEY RIGHT RING FINGER;  Surgeon: Daryll Brod, MD;  Location: [facility];  Service: Orthopedics;  Laterality: Right;    There were no vitals filed for this visit.  Subjective Assessment - 10/28/19 1404    Subjective  States yesterday she was watering flowers and had the watering can in her right hand and the cane in her left hand and she was leaning forward/bending over and lost her balance and she hit the ground (dirt) and she hit her head and she slid forward. States 2 weeks ago she hit the same place. States she rolled on to her side and she was able to get up with her cane. States she was not wearing her life alert and she did not have her cellphone. States her head pain is about a 4/10 and that's with pain medication. States her neck is also sore.    Pertinent History  parkinson's    Patient Stated Goals  to have less falls  and more stamina    Pain Score  4     Pain Location  Head    Pain Orientation  Anterior    Pain Descriptors / Indicators  Aching;Tightness    Pain Type  Acute pain         [facility] PT Assessment - 10/28/19 0001      Assessment   Medical Diagnosis  balance deficits    Referring Provider (PT)   Ward Givens NP                            PT Education - 10/28/19 1408    Education Details  Educated patient and discussed importance of having life alert and phone on her at all time. Discussed how forward bending is most vulnerable position for her in regards to falling and brainstormed different activities she performs at home that requires forward bending and alternative movements or options for her. Discussed use of soaker hose or  sprinkler for watering plants and having son turn the spigot on. Discussed getting someone to do the gardening (she previously had someone but they had surgery last year and couldn't continue doing it this year). Discussed importance of knowing when to ask for help and ensuring safety and importance of staying at her home. Educated patient on contacting MD about 2 recent falls and to go to ED if her symptoms persist/ continue.    Person(s) Educated  Patient    Methods  Explanation    Comprehension  Verbalized understanding       PT Short Term Goals - 09/21/19 1531      PT SHORT TERM GOAL #1   Title  Patient will be independent in self management strategies to improve quality of life and functional outcomes.    Time  3    Period  Weeks    Status  New    Target Date  10/12/19      PT SHORT TERM GOAL #2   Title  Patient will report at least 25% improvement in overall symptoms and/or functional ability.    Time  3    Period  Weeks    Status  New    Target Date  10/12/19        PT Long Term Goals - 09/21/19 1531      PT LONG TERM GOAL #1   Title  Patient will report at least 50% improvement in overall symptoms and/or functional ability.    Time  6    Period  Weeks    Status  New    Target Date  11/02/19      PT LONG TERM GOAL #2   Title  Patient will report no falls in the last 4 weeks to demonstrate improved balance at home and awareness of fall risk.    Time  6    Period  Weeks    Status  New    Target Date  11/02/19      PT LONG TERM GOAL #3   Title  Patient will score with at least 46 points on BERG to decrease risk of falls from significant to moderate.    Baseline  current 42    Time  6    Period  Weeks    Status  New    Target Date  11/02/19            Plan - 10/28/19 1516    Clinical Impression Statement  Patient presented to therapy with reports of recent fall. Focused on educating patient on current concerns with fall risk and activities she should avoid  secondary to her history of falling (mainly leaning forward activities). Brainstormed tasks she does at home and discussed alternatives or ways to get activities to be safe. Discussed always having cane, life alert and phone on her in case of a bad fall. Injuries assessed with contusion on left distal wrist, right mid forearm and forehead. Soreness noted along cervical and thoracic paraspinals but motion WNL and no signs of real concern. Advised patient ot follow up with MD about falls and soreness. Will reassess patient next session, could not perform today secondary to need for education and patient late to session.    Personal Factors and Comorbidities  Comorbidity 1;Comorbidity 2;Comorbidity 3+;Fitness    Comorbidities  parkingsons, history of falls, low back pain    Examination-Activity Limitations  Squat;Stairs;Stand;Transfers;Lift;Locomotion Level;Carry;Bend    Examination-Participation Restrictions  Cleaning;Meal Prep    Stability/Clinical Decision Making  Evolving/Moderate complexity    Rehab Potential  Good    PT Frequency  2x / week    PT Duration  6 weeks    PT Treatment/Interventions  ADLs/Self Care Home Management;Aquatic Therapy;Cryotherapy;Electrical Stimulation;Moist Heat;Traction;Balance training;Therapeutic exercise;Therapeutic activities;Functional mobility training;Stair training;Gait training;DME Instruction;Neuromuscular re-education;Patient/family education;Manual techniques;Passive range of motion    PT Next Visit Plan  Reassess next session. Continue step stragies for fall prevention.  Progress dynamic and static balance, LE functional strength    PT Home Exercise Plan  09/28/19:  Use cane at all times, slow down gait and improve sequence.  Improve postural awareness 5times day.  Seated rows with RTB. heel/toe raises.  Standing NBOS by counter/sink.       Patient will benefit from skilled therapeutic intervention in order to improve the following deficits and impairments:   Pain, Decreased range of motion, Decreased balance, Decreased coordination, Decreased activity tolerance, Abnormal gait, Decreased endurance, Decreased knowledge of precautions, Decreased strength, Difficulty walking  Visit Diagnosis: Unsteadiness on feet  Muscle weakness (generalized)  Difficulty in walking, not elsewhere classified     Problem List Patient Active Problem List   Diagnosis Date Noted  . GERD (gastroesophageal reflux disease)   . History of Nissen fundoplication   . IBS (irritable bowel syndrome) 02/05/2018  . Diarrhea 06/16/2017  . N&V (nausea and vomiting) 06/16/2017  . Numbness 08/18/2015  . Polyneuropathy 08/18/2015  . Menopausal syndrome (hot flushes) 06/15/2015  . Tremor, essential 06/15/2015  . Gastroesophageal reflux disease with esophagitis 06/15/2015  . Bilateral carpal tunnel syndrome 06/15/2015  . Resting tremor 06/15/2015  . Scoliosis of thoracic spine 11/05/2013  . Spinal stenosis, unspecified region other than cervical 12/03/2012  . Spondylosis of unspecified site without mention of myelopathy 12/03/2012  . Parkinson disease ([place])   . Back pain   . Seasonal allergies    3:17 PM, 10/28/19 Jerene Pitch, DPT Physical Therapy with [facility]  [phone] office  [facility] [address] [place], [place], [address] Phone: [phone]   Fax:  [phone]  Name: [name] MRN: [id number] Date of Birth: 04-14-1934

## 2019-11-02 ENCOUNTER — Ambulatory Visit (HOSPITAL_COMMUNITY): Payer: Medicare Other | Admitting: Physical Therapy

## 2019-11-02 ENCOUNTER — Other Ambulatory Visit: Payer: Self-pay

## 2019-11-02 ENCOUNTER — Encounter (HOSPITAL_COMMUNITY): Payer: Self-pay | Admitting: Physical Therapy

## 2019-11-02 DIAGNOSIS — M6281 Muscle weakness (generalized): Secondary | ICD-10-CM

## 2019-11-02 DIAGNOSIS — R293 Abnormal posture: Secondary | ICD-10-CM

## 2019-11-02 DIAGNOSIS — R262 Difficulty in walking, not elsewhere classified: Secondary | ICD-10-CM

## 2019-11-02 DIAGNOSIS — R2681 Unsteadiness on feet: Secondary | ICD-10-CM

## 2019-11-02 NOTE — Therapy (Signed)
[facility] [address] [place], [place], [address] Phone: [phone]   Fax:  [phone]  Physical Therapy Treatment, Progress Note and Recert  Patient Details  Name: [name] MRN: [id number] Date of Birth: 02/27/1934 Referring Provider (PT):  Megan Millikan NP  Progress Note Reporting Period 09/21/19 to 11/02/19  See note below for Objective Data and Assessment of Progress/Goals.       Encounter Date: 11/02/2019  PT End of Session - 11/02/19 1328    Visit Number  10    Number of Visits  20    Date for PT Re-Evaluation  12/07/19   PN 11/02/19   Authorization Type  [employer]HC Medicare, no co-ins, Copay $35, No auth, PT includes AQ coverage, No VL    Progress Note Due on Visit  20    PT Start Time  0126    PT Stop Time  0206    PT Time Calculation (min)  40 min    Equipment Utilized During Treatment  Gait belt    Activity Tolerance  Patient tolerated treatment well;Patient limited by fatigue    Behavior During Therapy  WFL for tasks assessed/performed       Past Medical History:  Diagnosis Date  . Ankle swelling    takes torsemide prn  . Anxiety   . Arthritis   . Asthma    allergy induced  . Back pain   . Complication of anesthesia   . Depression   . GERD (gastroesophageal reflux disease)   . Hypercholesterolemia   . Hypothyroid   . Parkinson disease ([facility])   . PONV (postoperative nausea and vomiting)   . Seasonal allergies     Past Surgical History:  Procedure Laterality Date  . ABDOMINAL HYSTERECTOMY    . CARPAL TUNNEL RELEASE Right 10/26/2015   Procedure: CARPAL TUNNEL RELEASE;  Surgeon: Gary Kuzma, MD;  Location: [facility];  Service: Orthopedics;  Laterality: Right;  . CARPOMETACARPEL SUSPENSION PLASTY Right 10/26/2015   Procedure: RIGHT HAND TRAPEZIECTOMY WITH THUMB METACARPEL SUSPENSION PLASTY;  Surgeon: Gary Kuzma, MD;  Location: [facility];  Service: Orthopedics;  Laterality:  Right;  . CATARACT EXTRACTION W/PHACO  12/23/2011   Procedure: CATARACT EXTRACTION PHACO AND INTRAOCULAR LENS PLACEMENT (IOC);  Surgeon: Carroll F Haines, MD;  [facility];  Service: Ophthalmology;  Laterality: Right;  CDE=5.16  . CHOLECYSTECTOMY    . EYE SURGERY     cataract extraction of left eye-[facility]-2002  . KNEE ARTHROSCOPY     right  . NISSEN FUNDOPLICATION     [facility]  . TRIGGER FINGER RELEASE Right 10/26/2015   Procedure: RELEASE A-1 PULLEY RIGHT RING FINGER;  Surgeon: Gary Kuzma, MD;  Location: [facility];  Service: Orthopedics;  Laterality: Right;    There were no vitals filed for this visit.  Subjective Assessment - 11/02/19 1331    Subjective  Has not contacted the doctor, neck is still pretty sore and difficult to move. reports 5/10 pain in her neck. States her head feels better, the swelling is not as bad. No falls since last seen each other. States that she is being very careful. States she has not been watering.  States her daughter has been staying with her.    Pertinent History  parkinson's    Patient Stated Goals  to have less falls and more stamina    Currently in Pain?  Yes    Pain Score  5     Pain   Location  Head         [facility] PT Assessment - 11/02/19 0001      Assessment   Medical Diagnosis  balance deficits    Referring Provider (PT)   Megan Millikan NP      Standardized Balance Assessment   Standardized Balance Assessment  Dynamic Gait Index      Berg Balance Test   Sit to Stand  Able to stand without using hands and stabilize independently    Standing Unsupported  Able to stand safely 2 minutes    Sitting with Back Unsupported but Feet Supported on Floor or Stool  Able to sit safely and securely 2 minutes    Stand to Sit  Sits safely with minimal use of hands    Transfers  Able to transfer safely, minor use of hands    Standing Unsupported with Eyes Closed  Able to stand 10 seconds safely    Standing Unsupported with Feet Together   Able to place feet together independently and stand 1 minute safely    From Standing, Reach Forward with Outstretched Arm  Can reach forward >12 cm safely (5")    From Standing Position, Pick up Object from Floor  Able to pick up shoe, needs supervision    From Standing Position, Turn to Look Behind Over each Shoulder  Looks behind from both sides and weight shifts well    Turn 360 Degrees  Needs close supervision or verbal cueing    Standing Unsupported, Alternately Place Feet on Step/Stool  Able to stand independently and complete 8 steps >20 seconds    Standing Unsupported, One Foot in Front  Able to plae foot ahead of the other independently and hold 30 seconds    Standing on One Leg  Tries to lift leg/unable to hold 3 seconds but remains standing independently    Total Score  46      Dynamic Gait Index   Level Surface  Mild Impairment    Change in Gait Speed  Mild Impairment    Gait with Horizontal Head Turns  Mild Impairment    Gait with Vertical Head Turns  Mild Impairment    Gait and Pivot Turn  Mild Impairment    Step Over Obstacle  Mild Impairment    Step Around Obstacles  Mild Impairment    Steps  Mild Impairment    Total Score  16    DGI comment:  uses cane                            PT Education - 11/02/19 1411    Education Details  on progress made on BERG. on continued balance challenges and difficulties. On importance of following up and plan moving forward.    Person(s) Educated  Patient    Methods  Explanation    Comprehension  Verbalized understanding       PT Short Term Goals - 11/02/19 1332      PT SHORT TERM GOAL #1   Title  Patient will be independent in self management strategies to improve quality of life and functional outcomes.    Baseline  past few days she has not been doing but prior too she was independent    Time  3    Period  Weeks    Status  On-going    Target Date  10/12/19      PT SHORT TERM GOAL #2   Title  Patient    will report at least 25% improvement in overall symptoms and/or functional ability.    Baseline  50% better in some ways (stronger) but in falls 0% better    Time  3    Period  Weeks    Status  On-going    Target Date  10/12/19        PT Long Term Goals - 11/02/19 1333      PT LONG TERM GOAL #1   Title  Patient will report at least 50% improvement in overall symptoms and/or functional ability.    Baseline  see above    Time  6    Period  Weeks    Status  On-going      PT LONG TERM GOAL #2   Title  Patient will report no falls in the last 4 weeks to demonstrate improved balance at home and awareness of fall risk.    Baseline  2 falls in the last 4 weeks, 1 fall about every 2 weeks    Time  6    Period  Weeks    Status  On-going      PT LONG TERM GOAL #3   Title  Patient will score with at least 46 points on BERG to decrease risk of falls from significant to moderate.    Baseline  initial 42; current 46    Time  6    Period  Weeks    Status  Achieved            Plan - 11/02/19 1415    Clinical Impression Statement  Patient present for a progress note on this date. Since her evaluation she has fallen twice, one time in the last week. Patient did demonstrate improved score on Berg and met 1/3 LTG and 0/2 STG at this time. Patient still presents with moderate fall risk based on DGI and Berg scores and recent history of falling. Educated patient on importance of avoiding activities that present with too much risk (watering plants/ reaching forward (and reviewed exercises. Extending POC additional 5 weeks at 2x/week to continue to focus on balance and decrease risk of falling again at home.    Personal Factors and Comorbidities  Comorbidity 1;Comorbidity 2;Comorbidity 3+;Fitness    Comorbidities  parkingsons, history of falls, low back pain    Examination-Activity Limitations  Squat;Stairs;Stand;Transfers;Lift;Locomotion Level;Carry;Bend    Examination-Participation Restrictions   Cleaning;Meal Prep    Stability/Clinical Decision Making  Evolving/Moderate complexity    Rehab Potential  Good    PT Frequency  2x / week    PT Duration  Other (comment)   5 weeks   PT Treatment/Interventions  ADLs/Self Care Home Management;Aquatic Therapy;Cryotherapy;Electrical Stimulation;Moist Heat;Traction;Balance training;Therapeutic exercise;Therapeutic activities;Functional mobility training;Stair training;Gait training;DME Instruction;Neuromuscular re-education;Patient/family education;Manual techniques;Passive range of motion    PT Next Visit Plan  continue to focus on balance - reaching forward and anterior lean exercises (most likely position of falling), as well as dynamic tasks (reports some unsteadiness with turn tasks on DGI/Berg.    PT Home Exercise Plan  09/28/19:  Use cane at all times, slow down gait and improve sequence.  Improve postural awareness 5times day.  Seated rows with RTB. heel/toe raises.  Standing NBOS by counter/sink.       Patient will benefit from skilled therapeutic intervention in order to improve the following deficits and impairments:  Pain, Decreased range of motion, Decreased balance, Decreased coordination, Decreased activity tolerance, Abnormal gait, Decreased endurance, Decreased knowledge of precautions, Decreased strength, Difficulty walking  Visit  Diagnosis: Unsteadiness on feet  Muscle weakness (generalized)  Difficulty in walking, not elsewhere classified  Abnormal posture     Problem List Patient Active Problem List   Diagnosis Date Noted  . GERD (gastroesophageal reflux disease)   . History of Nissen fundoplication   . IBS (irritable bowel syndrome) 02/05/2018  . Diarrhea 06/16/2017  . N&V (nausea and vomiting) 06/16/2017  . Numbness 08/18/2015  . Polyneuropathy 08/18/2015  . Menopausal syndrome (hot flushes) 06/15/2015  . Tremor, essential 06/15/2015  . Gastroesophageal reflux disease with esophagitis 06/15/2015  . Bilateral  carpal tunnel syndrome 06/15/2015  . Resting tremor 06/15/2015  . Scoliosis of thoracic spine 11/05/2013  . Spinal stenosis, unspecified region other than cervical 12/03/2012  . Spondylosis of unspecified site without mention of myelopathy 12/03/2012  . Parkinson disease ([facility])   . Back pain   . Seasonal allergies    2:19 PM, 11/02/19 Jayveon Convey, DPT Physical Therapy with [facility]  [phone] office  [facility] [address] [place], [place], [address] Phone: [phone]   Fax:  [phone]  Name: [name] MRN: [id number] Date of Birth: 05/26/1934   

## 2019-11-04 ENCOUNTER — Ambulatory Visit (HOSPITAL_COMMUNITY): Payer: Medicare Other | Admitting: Physical Therapy

## 2019-11-04 ENCOUNTER — Other Ambulatory Visit: Payer: Self-pay

## 2019-11-04 ENCOUNTER — Encounter (HOSPITAL_COMMUNITY): Payer: Self-pay | Admitting: Physical Therapy

## 2019-11-04 DIAGNOSIS — M6281 Muscle weakness (generalized): Secondary | ICD-10-CM

## 2019-11-04 DIAGNOSIS — R2681 Unsteadiness on feet: Secondary | ICD-10-CM

## 2019-11-04 DIAGNOSIS — R262 Difficulty in walking, not elsewhere classified: Secondary | ICD-10-CM

## 2019-11-04 NOTE — Therapy (Signed)
[facility] [address] [place], [place], [address] Phone: [phone]   Fax:  [phone]  Physical Therapy Treatment  Patient Details  Name: [name] MRN: [id number] Date of Birth: 1933-07-01 Referring Provider (PT):  Ward Givens NP   Encounter Date: 11/04/2019  PT End of Session - 11/04/19 1359    Visit Number  11    Number of Visits  20    Date for PT Re-Evaluation  12/07/19   PN 11/02/19   Authorization Type  UHC Medicare, no co-ins, Copay $35, No auth, PT includes AQ coverage, No VL    Progress Note Due on Visit  20    PT Start Time  1326   pt late to session   PT Stop Time  1404    PT Time Calculation (min)  38 min    Equipment Utilized During Treatment  Gait belt    Activity Tolerance  Patient tolerated treatment well;Patient limited by fatigue    Behavior During Therapy  WFL for tasks assessed/performed       Past Medical History:  Diagnosis Date  . Ankle swelling    takes torsemide prn  . Anxiety   . Arthritis   . Asthma    allergy induced  . Back pain   . Complication of anesthesia   . Depression   . GERD (gastroesophageal reflux disease)   . Hypercholesterolemia   . Hypothyroid   . Parkinson disease ([place])   . PONV (postoperative nausea and vomiting)   . Seasonal allergies     Past Surgical History:  Procedure Laterality Date  . ABDOMINAL HYSTERECTOMY    . CARPAL TUNNEL RELEASE Right 10/26/2015   Procedure: CARPAL TUNNEL RELEASE;  Surgeon: Daryll Brod, MD;  Location: [facility];  Service: Orthopedics;  Laterality: Right;  . CARPOMETACARPEL SUSPENSION PLASTY Right 10/26/2015   Procedure: RIGHT HAND TRAPEZIECTOMY WITH THUMB METACARPEL SUSPENSION PLASTY;  Surgeon: Daryll Brod, MD;  Location: [facility];  Service: Orthopedics;  Laterality: Right;  . CATARACT EXTRACTION W/PHACO  12/23/2011   Procedure: CATARACT EXTRACTION PHACO AND INTRAOCULAR LENS PLACEMENT (IOC);  Surgeon:  Williams Che, MD;  [facility];  Service: Ophthalmology;  Laterality: Right;  CDE=5.16  . CHOLECYSTECTOMY    . EYE SURGERY     cataract extraction of left eye-[facility]-2002  . KNEE ARTHROSCOPY     right  . NISSEN FUNDOPLICATION     [facility]  . TRIGGER FINGER RELEASE Right 10/26/2015   Procedure: RELEASE A-1 PULLEY RIGHT RING FINGER;  Surgeon: Daryll Brod, MD;  Location: [facility];  Service: Orthopedics;  Laterality: Right;    There were no vitals filed for this visit.  Subjective Assessment - 11/04/19 1354    Subjective  States she is feeling ok. She still has discomfort in her neck about 3/10 if she starts moving. States she messaged her doctor but the whole office is closed. States she thinks she left her cane in the car.    Pertinent History  parkinson's    Patient Stated Goals  to have less falls and more stamina    Currently in Pain?  Yes    Pain Score  3     Pain Location  Neck    Pain Orientation  Posterior    Pain Descriptors / Indicators  Aching         [facility] PT Assessment - 11/04/19 0001      Assessment   Medical Diagnosis  balance deficits    Referring Provider (PT)   Ward Givens NP                    [facility] Adult PT Treatment/Exercise - 11/04/19 0001      Knee/Hip Exercises: Standing   Other Standing Knee Exercises  tandem in // bars - uses arm to correct loss of balance 3x30 B; Reaching forward to target on black foam - in // bars 3x5     Other Standing Knee Exercises  rhythmic perturbations in all directions with gait belt and focus on stepping strategy - on floor first - 6 minutes, on black foam x 3 minutes - normal BOS in both positions EO, then 4 x1 minutes with eyes closed on black foam.              PT Education - 11/04/19 1413    Education Details  educated patient on rationale for exercises, focus on stepping strategy, weight shifts and using muscles to return to upright when she feels like she is falling.     Person(s) Educated  Patient    Methods  Explanation    Comprehension  Verbalized understanding       PT Short Term Goals - 11/02/19 1332      PT SHORT TERM GOAL #1   Title  Patient will be independent in self management strategies to improve quality of life and functional outcomes.    Baseline  past few days she has not been doing but prior too she was independent    Time  3    Period  Weeks    Status  On-going    Target Date  10/12/19      PT SHORT TERM GOAL #2   Title  Patient will report at least 25% improvement in overall symptoms and/or functional ability.    Baseline  50% better in some ways (stronger) but in falls 0% better    Time  3    Period  Weeks    Status  On-going    Target Date  10/12/19        PT Long Term Goals - 11/02/19 1333      PT LONG TERM GOAL #1   Title  Patient will report at least 50% improvement in overall symptoms and/or functional ability.    Baseline  see above    Time  6    Period  Weeks    Status  On-going      PT LONG TERM GOAL #2   Title  Patient will report no falls in the last 4 weeks to demonstrate improved balance at home and awareness of fall risk.    Baseline  2 falls in the last 4 weeks, 1 fall about every 2 weeks    Time  6    Period  Weeks    Status  On-going      PT LONG TERM GOAL #3   Title  Patient will score with at least 46 points on BERG to decrease risk of falls from significant to moderate.    Baseline  initial 42; current 46    Time  6    Period  Weeks    Status  Achieved            Plan - 11/04/19 1413    Clinical Impression Statement  Focused on educating patient in rationale for specific balance exercises and how to correct when she feels she has a loss of balance.  Tolerated this very well and would continue to benefit from these types of exercises as she cannot reproduce them safely at home and requires contact/min assist with loss of balance at times. Fatigue noted during session but no pain.     Personal Factors and Comorbidities  Comorbidity 1;Comorbidity 2;Comorbidity 3+;Fitness    Comorbidities  parkingsons, history of falls, low back pain    Examination-Activity Limitations  Squat;Stairs;Stand;Transfers;Lift;Locomotion Level;Carry;Bend    Examination-Participation Restrictions  Cleaning;Meal Prep    Stability/Clinical Decision Making  Evolving/Moderate complexity    Rehab Potential  Good    PT Frequency  2x / week    PT Duration  Other (comment)   5 weeks   PT Treatment/Interventions  ADLs/Self Care Home Management;Aquatic Therapy;Cryotherapy;Electrical Stimulation;Moist Heat;Traction;Balance training;Therapeutic exercise;Therapeutic activities;Functional mobility training;Stair training;Gait training;DME Instruction;Neuromuscular re-education;Patient/family education;Manual techniques;Passive range of motion    PT Next Visit Plan  f/u with cane use at all times, forward reaching, perturbations with patient correcting for LOB    PT Home Exercise Plan  09/28/19:  Use cane at all times, slow down gait and improve sequence.  Improve postural awareness 5times day.  Seated rows with RTB. heel/toe raises.  Standing NBOS by counter/sink.       Patient will benefit from skilled therapeutic intervention in order to improve the following deficits and impairments:  Pain, Decreased range of motion, Decreased balance, Decreased coordination, Decreased activity tolerance, Abnormal gait, Decreased endurance, Decreased knowledge of precautions, Decreased strength, Difficulty walking  Visit Diagnosis: Unsteadiness on feet  Muscle weakness (generalized)  Difficulty in walking, not elsewhere classified     Problem List Patient Active Problem List   Diagnosis Date Noted  . GERD (gastroesophageal reflux disease)   . History of Nissen fundoplication   . IBS (irritable bowel syndrome) 02/05/2018  . Diarrhea 06/16/2017  . N&V (nausea and vomiting) 06/16/2017  . Numbness 08/18/2015  .  Polyneuropathy 08/18/2015  . Menopausal syndrome (hot flushes) 06/15/2015  . Tremor, essential 06/15/2015  . Gastroesophageal reflux disease with esophagitis 06/15/2015  . Bilateral carpal tunnel syndrome 06/15/2015  . Resting tremor 06/15/2015  . Scoliosis of thoracic spine 11/05/2013  . Spinal stenosis, unspecified region other than cervical 12/03/2012  . Spondylosis of unspecified site without mention of myelopathy 12/03/2012  . Parkinson disease ([place])   . Back pain   . Seasonal allergies    2:17 PM, 11/04/19 Jerene Pitch, DPT Physical Therapy with [facility]  [phone] office  [facility] [address] [place], [place], [address] Phone: [phone]   Fax:  [phone]  Name: [name] MRN: [id number] Date of Birth: 1933-11-30

## 2019-11-09 ENCOUNTER — Ambulatory Visit (HOSPITAL_COMMUNITY): Payer: Medicare Other | Attending: Adult Health | Admitting: Physical Therapy

## 2019-11-09 ENCOUNTER — Other Ambulatory Visit: Payer: Self-pay

## 2019-11-09 ENCOUNTER — Encounter (HOSPITAL_COMMUNITY): Payer: Self-pay | Admitting: Physical Therapy

## 2019-11-09 DIAGNOSIS — M6281 Muscle weakness (generalized): Secondary | ICD-10-CM | POA: Insufficient documentation

## 2019-11-09 DIAGNOSIS — R293 Abnormal posture: Secondary | ICD-10-CM | POA: Diagnosis present

## 2019-11-09 DIAGNOSIS — R2681 Unsteadiness on feet: Secondary | ICD-10-CM | POA: Diagnosis present

## 2019-11-09 DIAGNOSIS — R262 Difficulty in walking, not elsewhere classified: Secondary | ICD-10-CM | POA: Diagnosis present

## 2019-11-09 NOTE — Therapy (Signed)
[place], [place], [address] Phone: [phone]   Fax:  [phone]  Physical Therapy Treatment  Patient Details  Name: [name] MRN: [id number] Date of Birth: May 06, 1934 Referring Provider (PT):  Ward Givens NP   Encounter Date: 11/09/2019  PT End of Session - 11/09/19 1129    Visit Number  12    Number of Visits  20    Date for PT Re-Evaluation  12/07/19   PN 11/02/19   Authorization Type  UHC Medicare, no co-ins, Copay $35, No auth, PT includes AQ coverage, No VL    Progress Note Due on Visit  20    PT Start Time  1130   arrived late   PT Stop Time  1200    PT Time Calculation (min)  30 min    Equipment Utilized During Treatment  Gait belt    Activity Tolerance  Patient tolerated treatment well    Behavior During Therapy  WFL for tasks assessed/performed       Past Medical History:  Diagnosis Date  . Ankle swelling    takes torsemide prn  . Anxiety   . Arthritis   . Asthma    allergy induced  . Back pain   . Complication of anesthesia   . Depression   . GERD (gastroesophageal reflux disease)   . Hypercholesterolemia   . Hypothyroid   . Parkinson disease ([place])   . PONV (postoperative nausea and vomiting)   . Seasonal allergies     Past Surgical History:  Procedure Laterality Date  . ABDOMINAL HYSTERECTOMY    . CARPAL TUNNEL RELEASE Right 10/26/2015   Procedure: CARPAL TUNNEL RELEASE;  Surgeon: Daryll Brod, MD;  Location: [facility];  Service: Orthopedics;  Laterality: Right;  . CARPOMETACARPEL SUSPENSION PLASTY Right 10/26/2015   Procedure: RIGHT HAND TRAPEZIECTOMY WITH THUMB METACARPEL SUSPENSION PLASTY;  Surgeon: Daryll Brod, MD;  Location: [facility];  Service: Orthopedics;  Laterality: Right;  . CATARACT EXTRACTION W/PHACO  12/23/2011   Procedure: CATARACT EXTRACTION PHACO AND INTRAOCULAR LENS PLACEMENT (IOC);  Surgeon: Williams Che, MD;  [facility];  Service: Ophthalmology;  Laterality: Right;  CDE=5.16  . CHOLECYSTECTOMY    . EYE SURGERY     cataract extraction of left eye-[facility]-2002  . KNEE ARTHROSCOPY     right  . NISSEN FUNDOPLICATION     [facility]  . TRIGGER FINGER RELEASE Right 10/26/2015   Procedure: RELEASE A-1 PULLEY RIGHT RING FINGER;  Surgeon: Daryll Brod, MD;  Location: [facility];  Service: Orthopedics;  Laterality: Right;    There were no vitals filed for this visit.  Subjective Assessment - 11/09/19 1134    Subjective  Patient says she thinks she is managing better with her balance. Denies any new falls since last visit. Says she has been careful, especially outside.    Pertinent History  parkinson's    Patient Stated Goals  to have less falls and more stamina    Currently in Pain?  Yes    Pain Score  3     Pain Location  Neck    Pain Orientation  Posterior    Pain Descriptors / Indicators  Aching    Pain Type  Acute pain    Pain Onset  1 to 4 weeks ago    Pain Frequency  Constant  [facility] Adult PT Treatment/Exercise - 11/09/19 0001      Knee/Hip Exercises: Standing   Other Standing Knee Exercises  standing band rows RTB 2 x10 with postural cues      Knee/Hip Exercises: Seated   Sit to Sand  1 set;10 reps          Balance Exercises - 11/09/19 1206      Balance Exercises: Standing   Standing Eyes Opened  Narrow base of support (BOS);Foam/compliant surface;30 secs;2 reps    Standing Eyes Closed  Narrow base of support (BOS);Foam/compliant surface;2 reps;30 secs    Tandem Stance  Eyes open;2 reps;30 secs    Heel Raises  Both;15 reps    Other Standing Exercises  NBOS on foam with head turns x 10; NBOS on foam with perturbations in all planes 2 x 30" rounds           PT Short Term Goals - 11/02/19 1332      PT SHORT TERM GOAL #1   Title  Patient will be independent in self management strategies to improve quality of life and functional outcomes.     Baseline  past few days she has not been doing but prior too she was independent    Time  3    Period  Weeks    Status  On-going    Target Date  10/12/19      PT SHORT TERM GOAL #2   Title  Patient will report at least 25% improvement in overall symptoms and/or functional ability.    Baseline  50% better in some ways (stronger) but in falls 0% better    Time  3    Period  Weeks    Status  On-going    Target Date  10/12/19        PT Long Term Goals - 11/02/19 1333      PT LONG TERM GOAL #1   Title  Patient will report at least 50% improvement in overall symptoms and/or functional ability.    Baseline  see above    Time  6    Period  Weeks    Status  On-going      PT LONG TERM GOAL #2   Title  Patient will report no falls in the last 4 weeks to demonstrate improved balance at home and awareness of fall risk.    Baseline  2 falls in the last 4 weeks, 1 fall about every 2 weeks    Time  6    Period  Weeks    Status  On-going      PT LONG TERM GOAL #3   Title  Patient will score with at least 46 points on BERG to decrease risk of falls from significant to moderate.    Baseline  initial 42; current 46    Time  6    Period  Weeks    Status  Achieved            Plan - 11/09/19 1218    Clinical Impression Statement  Patient tolerated session well today. Patient was well challenged with added dynamic component to balance on foam pad. Patient educated on purpose and function of all added activity. Patient demos improved static balance. Added band rows for improved postural strengthening for balance and decreased back and neck pain. Patient will continue to benefit from skilled therapy services to progress LE strength and balance to improve functional mobility and reduce risk for falls.    PT Next  Visit Plan  f/u with cane use at all times, forward reaching, perturbations with patient correcting for LOB. Add sidestepping next visit    PT Home Exercise Plan  09/28/19:  Use  cane at all times, slow down gait and improve sequence.  Improve postural awareness 5times day.  Seated rows with RTB. heel/toe raises.  Standing NBOS by counter/sink.    Consulted and Agree with Plan of Care  Patient       Patient will benefit from skilled therapeutic intervention in order to improve the following deficits and impairments:     Visit Diagnosis: Unsteadiness on feet  Muscle weakness (generalized)  Difficulty in walking, not elsewhere classified     Problem List Patient Active Problem List   Diagnosis Date Noted  . GERD (gastroesophageal reflux disease)   . History of Nissen fundoplication   . IBS (irritable bowel syndrome) 02/05/2018  . Diarrhea 06/16/2017  . N&V (nausea and vomiting) 06/16/2017  . Numbness 08/18/2015  . Polyneuropathy 08/18/2015  . Menopausal syndrome (hot flushes) 06/15/2015  . Tremor, essential 06/15/2015  . Gastroesophageal reflux disease with esophagitis 06/15/2015  . Bilateral carpal tunnel syndrome 06/15/2015  . Resting tremor 06/15/2015  . Scoliosis of thoracic spine 11/05/2013  . Spinal stenosis, unspecified region other than cervical 12/03/2012  . Spondylosis of unspecified site without mention of myelopathy 12/03/2012  . Parkinson disease ([place])   . Back pain   . Seasonal allergies    12:21 PM, 11/09/19 Josue Hector PT DPT  Physical Therapist with [facility]  [phone]   [facility] [address] [place], [place], [address] Phone: [phone]   Fax:  [phone]  Name: [name] MRN: [id number] Date of Birth: 05-Jul-1933

## 2019-11-16 ENCOUNTER — Other Ambulatory Visit: Payer: Self-pay

## 2019-11-16 ENCOUNTER — Encounter (HOSPITAL_COMMUNITY): Payer: Self-pay

## 2019-11-16 ENCOUNTER — Ambulatory Visit (HOSPITAL_COMMUNITY): Payer: Medicare Other

## 2019-11-16 DIAGNOSIS — R2681 Unsteadiness on feet: Secondary | ICD-10-CM | POA: Diagnosis not present

## 2019-11-16 DIAGNOSIS — M6281 Muscle weakness (generalized): Secondary | ICD-10-CM

## 2019-11-16 DIAGNOSIS — R293 Abnormal posture: Secondary | ICD-10-CM

## 2019-11-16 DIAGNOSIS — R262 Difficulty in walking, not elsewhere classified: Secondary | ICD-10-CM

## 2019-11-17 NOTE — Therapy (Signed)
[place], [place], [address] Phone: [phone]   Fax:  [phone]  Physical Therapy Treatment  Patient Details  Name: [name] MRN: [id number] Date of Birth: 01/06/1934 Referring Provider (PT):  Ward Givens NP   Encounter Date: 11/16/2019  PT End of Session - 11/16/19 1343    Visit Number  13    Number of Visits  20    Date for PT Re-Evaluation  12/07/19   PN 11/02/19   Authorization Type  UHC Medicare, no co-ins, Copay $35, No auth, PT includes AQ coverage, No VL    Progress Note Due on Visit  20    PT Start Time  1330    PT Stop Time  1400    PT Time Calculation (min)  30 min    Equipment Utilized During Treatment  Gait belt    Activity Tolerance  Patient tolerated treatment well    Behavior During Therapy  WFL for tasks assessed/performed       Past Medical History:  Diagnosis Date  . Ankle swelling    takes torsemide prn  . Anxiety   . Arthritis   . Asthma    allergy induced  . Back pain   . Complication of anesthesia   . Depression   . GERD (gastroesophageal reflux disease)   . Hypercholesterolemia   . Hypothyroid   . Parkinson disease ([place])   . PONV (postoperative nausea and vomiting)   . Seasonal allergies     Past Surgical History:  Procedure Laterality Date  . ABDOMINAL HYSTERECTOMY    . CARPAL TUNNEL RELEASE Right 10/26/2015   Procedure: CARPAL TUNNEL RELEASE;  Surgeon: Daryll Brod, MD;  Location: [facility];  Service: Orthopedics;  Laterality: Right;  . CARPOMETACARPEL SUSPENSION PLASTY Right 10/26/2015   Procedure: RIGHT HAND TRAPEZIECTOMY WITH THUMB METACARPEL SUSPENSION PLASTY;  Surgeon: Daryll Brod, MD;  Location: [facility];  Service: Orthopedics;  Laterality: Right;  . CATARACT EXTRACTION W/PHACO  12/23/2011   Procedure: CATARACT EXTRACTION PHACO AND INTRAOCULAR LENS PLACEMENT (IOC);  Surgeon: Williams Che, MD;  [facility];  Service:  Ophthalmology;  Laterality: Right;  CDE=5.16  . CHOLECYSTECTOMY    . EYE SURGERY     cataract extraction of left eye-[facility]-2002  . KNEE ARTHROSCOPY     right  . NISSEN FUNDOPLICATION     [facility]  . TRIGGER FINGER RELEASE Right 10/26/2015   Procedure: RELEASE A-1 PULLEY RIGHT RING FINGER;  Surgeon: Daryll Brod, MD;  Location: [facility];  Service: Orthopedics;  Laterality: Right;    There were no vitals filed for this visit.  Subjective Assessment - 11/16/19 1332    Subjective  Pt late for apt today.  States she feels out of balance today.  No reports or recent falls.    Pertinent History  parkinson's    Patient Stated Goals  to have less falls and more stamina    Currently in Pain?  Yes    Pain Score  5     Pain Location  Neck    Pain Orientation  Posterior    Pain Descriptors / Indicators  Aching;Sore    Pain Type  Chronic pain    Pain Onset  1 to 4 weeks ago    Pain Frequency  Constant              11/16/19 0001  Balance Exercises: Standing  Tandem Stance Eyes open;Foam/compliant surface;Other (comment) (  Paloff on foam 10 reps x4 sets)  Sidestepping 3 reps  Heel Raises Both;10 reps (wall arch with heel raise)  Other Standing Exercises NBOS on foam with head turns x 10; NBOS on foam with perturbations in all planes 2 x 30" rounds                           PT Short Term Goals - 11/02/19 1332      PT SHORT TERM GOAL #1   Title  Patient will be independent in self management strategies to improve quality of life and functional outcomes.    Baseline  past few days she has not been doing but prior too she was independent    Time  3    Period  Weeks    Status  On-going    Target Date  10/12/19      PT SHORT TERM GOAL #2   Title  Patient will report at least 25% improvement in overall symptoms and/or functional ability.    Baseline  50% better in some ways (stronger) but in falls 0% better    Time  3    Period  Weeks    Status   On-going    Target Date  10/12/19        PT Long Term Goals - 11/02/19 1333      PT LONG TERM GOAL #1   Title  Patient will report at least 50% improvement in overall symptoms and/or functional ability.    Baseline  see above    Time  6    Period  Weeks    Status  On-going      PT LONG TERM GOAL #2   Title  Patient will report no falls in the last 4 weeks to demonstrate improved balance at home and awareness of fall risk.    Baseline  2 falls in the last 4 weeks, 1 fall about every 2 weeks    Time  6    Period  Weeks    Status  On-going      PT LONG TERM GOAL #3   Title  Patient will score with at least 46 points on BERG to decrease risk of falls from significant to moderate.    Baseline  initial 42; current 46    Time  6    Period  Weeks    Status  Achieved            Plan - 11/16/19 1400    Clinical Impression Statement  Pt late for apt, discussed time management and encouraged pt to leave house earlier for maximal benefits.  Session focus on balalnce training.  Add wall arches wiht heel raises for posture and balance and paloff for core strengthening to assist wiht balance as well as side stepping for gluteal strengthening.  Min A reuqired for LOB episodes on dynamic surfaces.  No reports of pain, was limited by fatigue wiht activiites.    Personal Factors and Comorbidities  Comorbidity 1;Comorbidity 2;Comorbidity 3+;Fitness    Comorbidities  parkingsons, history of falls, low back pain    Examination-Activity Limitations  Squat;Stairs;Stand;Transfers;Lift;Locomotion Level;Carry;Bend    Examination-Participation Restrictions  Cleaning;Meal Prep    Stability/Clinical Decision Making  Evolving/Moderate complexity    Clinical Decision Making  Moderate    Rehab Potential  Good    PT Frequency  2x / week    PT Duration  --   5 weeks   PT Treatment/Interventions  ADLs/Self Care Home Management;Aquatic Therapy;Cryotherapy;Electrical Stimulation;Moist Heat;Traction;Balance  training;Therapeutic exercise;Therapeutic activities;Functional mobility training;Stair training;Gait training;DME Instruction;Neuromuscular re-education;Patient/family education;Manual techniques;Passive range of motion    PT Next Visit Plan  f/u with cane use at all times, forward reaching, perturbations with patient correcting for LOB.    PT Home Exercise Plan  09/28/19:  Use cane at all times, slow down gait and improve sequence.  Improve postural awareness 5times day.  Seated rows with RTB. heel/toe raises.  Standing NBOS by counter/sink.       Patient will benefit from skilled therapeutic intervention in order to improve the following deficits and impairments:  Pain, Decreased range of motion, Decreased balance, Decreased coordination, Decreased activity tolerance, Abnormal gait, Decreased endurance, Decreased knowledge of precautions, Decreased strength, Difficulty walking  Visit Diagnosis: Unsteadiness on feet  Muscle weakness (generalized)  Difficulty in walking, not elsewhere classified  Abnormal posture     Problem List Patient Active Problem List   Diagnosis Date Noted  . GERD (gastroesophageal reflux disease)   . History of Nissen fundoplication   . IBS (irritable bowel syndrome) 02/05/2018  . Diarrhea 06/16/2017  . N&V (nausea and vomiting) 06/16/2017  . Numbness 08/18/2015  . Polyneuropathy 08/18/2015  . Menopausal syndrome (hot flushes) 06/15/2015  . Tremor, essential 06/15/2015  . Gastroesophageal reflux disease with esophagitis 06/15/2015  . Bilateral carpal tunnel syndrome 06/15/2015  . Resting tremor 06/15/2015  . Scoliosis of thoracic spine 11/05/2013  . Spinal stenosis, unspecified region other than cervical 12/03/2012  . Spondylosis of unspecified site without mention of myelopathy 12/03/2012  . Parkinson disease ([place])   . Back pain   . Seasonal allergies     Aldona Lento 11/17/2019, 2:13 PM  [place], [place], [address] Phone: [phone]   Fax:  [phone]  Name: [place] PAONE MRN: [id number] Date of Birth: 1933/11/19

## 2019-11-18 ENCOUNTER — Ambulatory Visit (HOSPITAL_COMMUNITY): Payer: Medicare Other

## 2019-11-18 ENCOUNTER — Telehealth (HOSPITAL_COMMUNITY): Payer: Self-pay

## 2019-11-18 NOTE — Telephone Encounter (Signed)
No show, called and left message concerning missed apt.  Reminded next apt date and time with contact information included.  Ihor Austin, LPTA/CLT; Delana Meyer [phone]

## 2019-11-23 ENCOUNTER — Encounter (HOSPITAL_COMMUNITY): Payer: Self-pay

## 2019-11-23 ENCOUNTER — Ambulatory Visit (HOSPITAL_COMMUNITY): Payer: Medicare Other

## 2019-11-23 ENCOUNTER — Other Ambulatory Visit: Payer: Self-pay

## 2019-11-23 DIAGNOSIS — R262 Difficulty in walking, not elsewhere classified: Secondary | ICD-10-CM

## 2019-11-23 DIAGNOSIS — R293 Abnormal posture: Secondary | ICD-10-CM

## 2019-11-23 DIAGNOSIS — R2681 Unsteadiness on feet: Secondary | ICD-10-CM | POA: Diagnosis not present

## 2019-11-23 DIAGNOSIS — M6281 Muscle weakness (generalized): Secondary | ICD-10-CM

## 2019-11-23 NOTE — Therapy (Signed)
[facility] [place], [place], [address] Phone: [phone]   Fax:  [phone]  Physical Therapy Treatment  Patient Details  Name: [name] MRN: [id number] Date of Birth: 1933/11/11 Referring Provider (PT):  Ward Givens NP   Encounter Date: 11/23/2019   PT End of Session - 11/23/19 1202    Visit Number 14    Number of Visits 20    Date for PT Re-Evaluation 12/07/19   PN 11/02/19   Authorization Type UHC Medicare, no co-ins, Copay $35, No auth, PT includes AQ coverage, No VL    Progress Note Due on Visit 20    PT Start Time 1134    PT Stop Time 1215    PT Time Calculation (min) 41 min    Equipment Utilized During Treatment Gait belt    Activity Tolerance Patient tolerated treatment well    Behavior During Therapy WFL for tasks assessed/performed           Past Medical History:  Diagnosis Date  . Ankle swelling    takes torsemide prn  . Anxiety   . Arthritis   . Asthma    allergy induced  . Back pain   . Complication of anesthesia   . Depression   . GERD (gastroesophageal reflux disease)   . Hypercholesterolemia   . Hypothyroid   . Parkinson disease ([place])   . PONV (postoperative nausea and vomiting)   . Seasonal allergies     Past Surgical History:  Procedure Laterality Date  . ABDOMINAL HYSTERECTOMY    . CARPAL TUNNEL RELEASE Right 10/26/2015   Procedure: CARPAL TUNNEL RELEASE;  Surgeon: Daryll Brod, MD;  Location: [facility];  Service: Orthopedics;  Laterality: Right;  . CARPOMETACARPEL SUSPENSION PLASTY Right 10/26/2015   Procedure: RIGHT HAND TRAPEZIECTOMY WITH THUMB METACARPEL SUSPENSION PLASTY;  Surgeon: Daryll Brod, MD;  Location: [facility];  Service: Orthopedics;  Laterality: Right;  . CATARACT EXTRACTION W/PHACO  12/23/2011   Procedure: CATARACT EXTRACTION PHACO AND INTRAOCULAR LENS PLACEMENT (IOC);  Surgeon: Williams Che, MD;  Location[facility];  Service:  Ophthalmology;  Laterality: Right;  CDE=5.16  . CHOLECYSTECTOMY    . EYE SURGERY     cataract extraction of left eye-[facility]-2002  . KNEE ARTHROSCOPY     right  . NISSEN FUNDOPLICATION     [facility]  . TRIGGER FINGER RELEASE Right 10/26/2015   Procedure: RELEASE A-1 PULLEY RIGHT RING FINGER;  Surgeon: Daryll Brod, MD;  Location: [facility];  Service: Orthopedics;  Laterality: Right;    There were no vitals filed for this visit.   Subjective Assessment - 11/23/19 1151    Subjective Pt stated she feels good today.  No reports of recent falls, stated she feels stronger.  Continues to walk with SPC.    Pertinent History parkinson's    Patient Stated Goals to have less falls and more stamina    Currently in Pain? No/denies                             [facility] Adult PT Treatment/Exercise - 11/23/19 0001      Knee/Hip Exercises: Standing   Other Standing Knee Exercises rhythmic perturbations in all directions on foam               Balance Exercises - 11/23/19 0001      Balance Exercises: Standing   Tandem Stance Eyes open;Foam/compliant  surface;Other (comment)    Tandem Gait 2 reps    Retro Gait 2 reps    Sidestepping 2 reps;Theraband   GTB   Heel Raises Both;15 reps;Other (comment)   heel raise with UE arch   Sit to Stand Standard surface   cueing to reduce adduction, added GTB on thigh   Other Standing Exercises NBOS on foam with head turns x 10; NBOS on foam with perturbations in all planes 2 x 30" rounds                PT Short Term Goals - 11/02/19 1332      PT SHORT TERM GOAL #1   Title Patient will be independent in self management strategies to improve quality of life and functional outcomes.    Baseline past few days she has not been doing but prior too she was independent    Time 3    Period Weeks    Status On-going    Target Date 10/12/19      PT SHORT TERM GOAL #2   Title Patient will report at least 25% improvement in overall  symptoms and/or functional ability.    Baseline 50% better in some ways (stronger) but in falls 0% better    Time 3    Period Weeks    Status On-going    Target Date 10/12/19             PT Long Term Goals - 11/02/19 1333      PT LONG TERM GOAL #1   Title Patient will report at least 50% improvement in overall symptoms and/or functional ability.    Baseline see above    Time 6    Period Weeks    Status On-going      PT LONG TERM GOAL #2   Title Patient will report no falls in the last 4 weeks to demonstrate improved balance at home and awareness of fall risk.    Baseline 2 falls in the last 4 weeks, 1 fall about every 2 weeks    Time 6    Period Weeks    Status On-going      PT LONG TERM GOAL #3   Title Patient will score with at least 46 points on BERG to decrease risk of falls from significant to moderate.    Baseline initial 42; current 46    Time 6    Period Weeks    Status Achieved                 Plan - 11/23/19 1305    Clinical Impression Statement Pt progressing well towards treatment.  Added theraband around thigh with cueing to reduce adduction during STS as well as during sidestepping for gluteal strengthening.  Pt improving control with DGI activities with head turns and stop/go.  Pt also presents with improved posture with less cueing required to look up.  Min A required with retro gait and dynamic surfaces for safety.  No reports of pain through session, was limited by fatigue.    Personal Factors and Comorbidities Comorbidity 1;Comorbidity 2;Comorbidity 3+;Fitness    Comorbidities parkingsons, history of falls, low back pain    Examination-Activity Limitations Squat;Stairs;Stand;Transfers;Lift;Locomotion Level;Carry;Bend    Examination-Participation Restrictions Cleaning;Meal Prep    Stability/Clinical Decision Making Evolving/Moderate complexity    Clinical Decision Making Moderate    Rehab Potential Good    PT Frequency 2x / week    PT Duration  --   5 weeks   PT  Treatment/Interventions ADLs/Self Care Home Management;Aquatic Therapy;Cryotherapy;Electrical Stimulation;Moist Heat;Traction;Balance training;Therapeutic exercise;Therapeutic activities;Functional mobility training;Stair training;Gait training;DME Instruction;Neuromuscular re-education;Patient/family education;Manual techniques;Passive range of motion    PT Next Visit Plan f/u with cane use at all times, forward reaching, perturbations with patient correcting for LOB.    PT Home Exercise Plan 09/28/19:  Use cane at all times, slow down gait and improve sequence.  Improve postural awareness 5times day.  Seated rows with RTB. heel/toe raises.  Standing NBOS by counter/sink.           Patient will benefit from skilled therapeutic intervention in order to improve the following deficits and impairments:  Pain, Decreased range of motion, Decreased balance, Decreased coordination, Decreased activity tolerance, Abnormal gait, Decreased endurance, Decreased knowledge of precautions, Decreased strength, Difficulty walking  Visit Diagnosis: Unsteadiness on feet  Muscle weakness (generalized)  Difficulty in walking, not elsewhere classified  Abnormal posture     Problem List Patient Active Problem List   Diagnosis Date Noted  . GERD (gastroesophageal reflux disease)   . History of Nissen fundoplication   . IBS (irritable bowel syndrome) 02/05/2018  . Diarrhea 06/16/2017  . N&V (nausea and vomiting) 06/16/2017  . Numbness 08/18/2015  . Polyneuropathy 08/18/2015  . Menopausal syndrome (hot flushes) 06/15/2015  . Tremor, essential 06/15/2015  . Gastroesophageal reflux disease with esophagitis 06/15/2015  . Bilateral carpal tunnel syndrome 06/15/2015  . Resting tremor 06/15/2015  . Scoliosis of thoracic spine 11/05/2013  . Spinal stenosis, unspecified region other than cervical 12/03/2012  . Spondylosis of unspecified site without mention of myelopathy 12/03/2012  .  Parkinson disease ([place])   . Back pain   . Seasonal allergies    Ihor Austin, LPTA/CLT; CBIS [phone]  Aldona Lento 11/23/2019, 1:24 PM  [facility] [address] [place], [place], [address] Phone: [phone]   Fax:  [phone]  Name: OMER MONTER MRN: [id number] Date of Birth: 11/04/1933

## 2019-11-25 ENCOUNTER — Ambulatory Visit (HOSPITAL_COMMUNITY): Payer: Medicare Other | Admitting: Physical Therapy

## 2019-11-25 ENCOUNTER — Other Ambulatory Visit: Payer: Self-pay

## 2019-11-25 ENCOUNTER — Encounter (HOSPITAL_COMMUNITY): Payer: Self-pay | Admitting: Physical Therapy

## 2019-11-25 DIAGNOSIS — R2681 Unsteadiness on feet: Secondary | ICD-10-CM

## 2019-11-25 DIAGNOSIS — M6281 Muscle weakness (generalized): Secondary | ICD-10-CM

## 2019-11-25 DIAGNOSIS — R262 Difficulty in walking, not elsewhere classified: Secondary | ICD-10-CM

## 2019-11-25 NOTE — Therapy (Signed)
[facility] [address] [place], [place], [address] Phone: [phone]   Fax:  [phone]  Physical Therapy Treatment  Patient Details  Name: [name] MRN: [id number] Date of Birth: [birth year] Referring Provider (PT):  Ward Givens NP   Encounter Date: 11/25/2019   PT End of Session - 11/25/19 1417    Visit Number 15    Number of Visits 20    Date for PT Re-Evaluation 12/07/19   PN 11/02/19   Authorization Type UHC Medicare, no co-ins, Copay $35, No auth, PT includes AQ coverage, No VL    Progress Note Due on Visit 20    PT Start Time 1415   pt 14 minutes late to session   PT Stop Time 1440    PT Time Calculation (min) 25 min    Equipment Utilized During Treatment Gait belt    Activity Tolerance Patient tolerated treatment well    Behavior During Therapy WFL for tasks assessed/performed           Past Medical History:  Diagnosis Date  . Ankle swelling    takes torsemide prn  . Anxiety   . Arthritis   . Asthma    allergy induced  . Back pain   . Complication of anesthesia   . Depression   . GERD (gastroesophageal reflux disease)   . Hypercholesterolemia   . Hypothyroid   . Parkinson disease ([place])   . PONV (postoperative nausea and vomiting)   . Seasonal allergies     Past Surgical History:  Procedure Laterality Date  . ABDOMINAL HYSTERECTOMY    . CARPAL TUNNEL RELEASE Right 10/26/2015   Procedure: CARPAL TUNNEL RELEASE;  Surgeon: Daryll Brod, MD;  Location: [facility];  Service: Orthopedics;  Laterality: Right;  . CARPOMETACARPEL SUSPENSION PLASTY Right 10/26/2015   Procedure: RIGHT HAND TRAPEZIECTOMY WITH THUMB METACARPEL SUSPENSION PLASTY;  Surgeon: Daryll Brod, MD;  Location: [facility];  Service: Orthopedics;  Laterality: Right;  . CATARACT EXTRACTION W/PHACO  12/23/2011   Procedure: CATARACT EXTRACTION PHACO AND INTRAOCULAR LENS PLACEMENT (IOC);  Surgeon: Williams Che, MD;   Location[facility];  Service: Ophthalmology;  Laterality: Right;  CDE=5.16  . CHOLECYSTECTOMY    . EYE SURGERY     cataract extraction of left eye-[facility]-2002  . KNEE ARTHROSCOPY     right  . NISSEN FUNDOPLICATION     [facility]  . TRIGGER FINGER RELEASE Right 10/26/2015   Procedure: RELEASE A-1 PULLEY RIGHT RING FINGER;  Surgeon: Daryll Brod, MD;  Location: [facility];  Service: Orthopedics;  Laterality: Right;    There were no vitals filed for this visit.   Subjective Assessment - 11/25/19 1442    Subjective States that she has not had any falls in the last two weeks. States she has been doing her exercises at home with no difficulties. States she as no pain today.    Pertinent History parkinson's    Patient Stated Goals to have less falls and more stamina              [facility] PT Assessment - 11/25/19 0001      Assessment   Medical Diagnosis balance deficits    Referring Provider (PT)  Ward Givens NP                         [facility] Adult PT Treatment/Exercise - 11/25/19 0001      Knee/Hip Exercises: Standing  Other Standing Knee Exercises semi tandem on black foam with cervical ROT 4x5 B and each     Other Standing Knee Exercises narrow BOS on black foam with forward reaching to sticky note targets on wall 4x5 B - 2 notes - focus on full upright extension after each rep- CGA and over a table for safety.                    PT Short Term Goals - 11/02/19 1332      PT SHORT TERM GOAL #1   Title Patient will be independent in self management strategies to improve quality of life and functional outcomes.    Baseline past few days she has not been doing but prior too she was independent    Time 3    Period Weeks    Status On-going    Target Date 10/12/19      PT SHORT TERM GOAL #2   Title Patient will report at least 25% improvement in overall symptoms and/or functional ability.    Baseline 50% better in some ways (stronger) but in falls 0%  better    Time 3    Period Weeks    Status On-going    Target Date 10/12/19             PT Long Term Goals - 11/02/19 1333      PT LONG TERM GOAL #1   Title Patient will report at least 50% improvement in overall symptoms and/or functional ability.    Baseline see above    Time 6    Period Weeks    Status On-going      PT LONG TERM GOAL #2   Title Patient will report no falls in the last 4 weeks to demonstrate improved balance at home and awareness of fall risk.    Baseline 2 falls in the last 4 weeks, 1 fall about every 2 weeks    Time 6    Period Weeks    Status On-going      PT LONG TERM GOAL #3   Title Patient will score with at least 46 points on BERG to decrease risk of falls from significant to moderate.    Baseline initial 42; current 46    Time 6    Period Weeks    Status Achieved                 Plan - 11/25/19 1436    Clinical Impression Statement Patient arrived late to session so session was limited secondary to time. Challenged patient with forward reaching secondary to this being most provocative motion for patient in regards to falling. Continued with head turns on foam as patient also has difficulties with this and focused on taking a step when there is a loss of balance as she forgets to try to catch herself with falling.    Personal Factors and Comorbidities Comorbidity 1;Comorbidity 2;Comorbidity 3+;Fitness    Comorbidities parkingsons, history of falls, low back pain    Examination-Activity Limitations Squat;Stairs;Stand;Transfers;Lift;Locomotion Level;Carry;Bend    Examination-Participation Restrictions Cleaning;Meal Prep    Stability/Clinical Decision Making Evolving/Moderate complexity    Rehab Potential Good    PT Frequency 2x / week    PT Duration --   5 weeks   PT Treatment/Interventions ADLs/Self Care Home Management;Aquatic Therapy;Cryotherapy;Electrical Stimulation;Moist Heat;Traction;Balance training;Therapeutic exercise;Therapeutic  activities;Functional mobility training;Stair training;Gait training;DME Instruction;Neuromuscular re-education;Patient/family education;Manual techniques;Passive range of motion    PT Next Visit Plan f/u with cane use  at all times, forward reaching with full return to upright, perturbations with patient correcting for LOB.    PT Home Exercise Plan 09/28/19:  Use cane at all times, slow down gait and improve sequence.  Improve postural awareness 5times day.  Seated rows with RTB. heel/toe raises.  Standing NBOS by counter/sink.           Patient will benefit from skilled therapeutic intervention in order to improve the following deficits and impairments:  Pain, Decreased range of motion, Decreased balance, Decreased coordination, Decreased activity tolerance, Abnormal gait, Decreased endurance, Decreased knowledge of precautions, Decreased strength, Difficulty walking  Visit Diagnosis: Unsteadiness on feet  Muscle weakness (generalized)  Difficulty in walking, not elsewhere classified     Problem List Patient Active Problem List   Diagnosis Date Noted  . GERD (gastroesophageal reflux disease)   . History of Nissen fundoplication   . IBS (irritable bowel syndrome) 02/05/2018  . Diarrhea 06/16/2017  . N&V (nausea and vomiting) 06/16/2017  . Numbness 08/18/2015  . Polyneuropathy 08/18/2015  . Menopausal syndrome (hot flushes) 06/15/2015  . Tremor, essential 06/15/2015  . Gastroesophageal reflux disease with esophagitis 06/15/2015  . Bilateral carpal tunnel syndrome 06/15/2015  . Resting tremor 06/15/2015  . Scoliosis of thoracic spine 11/05/2013  . Spinal stenosis, unspecified region other than cervical 12/03/2012  . Spondylosis of unspecified site without mention of myelopathy 12/03/2012  . Parkinson disease ([place])   . Back pain   . Seasonal allergies    2:42 PM, 11/25/19 Jerene Pitch, DPT Physical Therapy with [facility]  [phone] office  [facility] [address] [place], [place], [address] Phone: [phone]   Fax:  [phone]  Name: [name] MRN: [id number] Date of Birth: [birth year]

## 2019-11-30 ENCOUNTER — Other Ambulatory Visit: Payer: Self-pay

## 2019-11-30 ENCOUNTER — Ambulatory Visit (HOSPITAL_COMMUNITY): Payer: Medicare Other | Admitting: Physical Therapy

## 2019-11-30 DIAGNOSIS — M6281 Muscle weakness (generalized): Secondary | ICD-10-CM

## 2019-11-30 DIAGNOSIS — R2681 Unsteadiness on feet: Secondary | ICD-10-CM

## 2019-11-30 DIAGNOSIS — R262 Difficulty in walking, not elsewhere classified: Secondary | ICD-10-CM

## 2019-11-30 NOTE — Therapy (Signed)
[facility] [place], [place], [address] Phone: [phone]   Fax:  [phone]  Physical Therapy Treatment  Patient Details  Name: [name] MRN: [id number] Date of Birth: December 14, 1933 Referring Provider (PT):  Ward Givens NP   Encounter Date: 11/30/2019   PT End of Session - 11/30/19 1218    Visit Number 16    Number of Visits 20    Date for PT Re-Evaluation 12/07/19   PN 11/02/19   Authorization Type UHC Medicare, no co-ins, Copay $35, No auth, PT includes AQ coverage, No VL    Progress Note Due on Visit 20    PT Start Time 1150    PT Stop Time 1218    PT Time Calculation (min) 28 min    Equipment Utilized During Treatment Gait belt    Activity Tolerance Patient tolerated treatment well    Behavior During Therapy WFL for tasks assessed/performed           Past Medical History:  Diagnosis Date  . Ankle swelling    takes torsemide prn  . Anxiety   . Arthritis   . Asthma    allergy induced  . Back pain   . Complication of anesthesia   . Depression   . GERD (gastroesophageal reflux disease)   . Hypercholesterolemia   . Hypothyroid   . Parkinson disease ([place])   . PONV (postoperative nausea and vomiting)   . Seasonal allergies     Past Surgical History:  Procedure Laterality Date  . ABDOMINAL HYSTERECTOMY    . CARPAL TUNNEL RELEASE Right 10/26/2015   Procedure: CARPAL TUNNEL RELEASE;  Surgeon: Daryll Brod, MD;  Location: [facility];  Service: Orthopedics;  Laterality: Right;  . CARPOMETACARPEL SUSPENSION PLASTY Right 10/26/2015   Procedure: RIGHT HAND TRAPEZIECTOMY WITH THUMB METACARPEL SUSPENSION PLASTY;  Surgeon: Daryll Brod, MD;  Location: [facility];  Service: Orthopedics;  Laterality: Right;  . CATARACT EXTRACTION W/PHACO  12/23/2011   Procedure: CATARACT EXTRACTION PHACO AND INTRAOCULAR LENS PLACEMENT (IOC);  Surgeon: Williams Che, MD;  [facility];  Service:  Ophthalmology;  Laterality: Right;  CDE=5.16  . CHOLECYSTECTOMY    . EYE SURGERY     cataract extraction of left eye-[facility]-2002  . KNEE ARTHROSCOPY     right  . NISSEN FUNDOPLICATION     [facility]  . TRIGGER FINGER RELEASE Right 10/26/2015   Procedure: RELEASE A-1 PULLEY RIGHT RING FINGER;  Surgeon: Daryll Brod, MD;  Location: [facility];  Service: Orthopedics;  Laterality: Right;    There were no vitals filed for this visit.   Subjective Assessment - 11/30/19 1201    Subjective pt showed 20 minutes late today  states she has no pain    Currently in Pain? No/denies                                  Balance Exercises - 11/30/19 0001      Balance Exercises: Standing   Tandem Stance Eyes open;Foam/compliant surface;Other (comment);Limitations    Tandem Stance Time UE flexion 10X each LE lead, UE punch out and rotation 10X each LE lead with 1# bar    Tandem Gait 2 reps    Retro Gait 2 reps    Sidestepping 2 reps;Theraband    Heel Raises Both;15 reps;Other (comment)    Sit to Stand Standard surface    Other  Standing Exercises NBOS on foam with head turns x 10; NBOS on foam with perturbations in all planes 2 x 30" rounds                PT Short Term Goals - 11/02/19 1332      PT SHORT TERM GOAL #1   Title Patient will be independent in self management strategies to improve quality of life and functional outcomes.    Baseline past few days she has not been doing but prior too she was independent    Time 3    Period Weeks    Status On-going    Target Date 10/12/19      PT SHORT TERM GOAL #2   Title Patient will report at least 25% improvement in overall symptoms and/or functional ability.    Baseline 50% better in some ways (stronger) but in falls 0% better    Time 3    Period Weeks    Status On-going    Target Date 10/12/19             PT Long Term Goals - 11/02/19 1333      PT LONG TERM GOAL #1   Title Patient will report at  least 50% improvement in overall symptoms and/or functional ability.    Baseline see above    Time 6    Period Weeks    Status On-going      PT LONG TERM GOAL #2   Title Patient will report no falls in the last 4 weeks to demonstrate improved balance at home and awareness of fall risk.    Baseline 2 falls in the last 4 weeks, 1 fall about every 2 weeks    Time 6    Period Weeks    Status On-going      PT LONG TERM GOAL #3   Title Patient will score with at least 46 points on BERG to decrease risk of falls from significant to moderate.    Baseline initial 42; current 46    Time 6    Period Weeks    Status Achieved                 Plan - 11/30/19 1227    Clinical Impression Statement Pt arrived late to session so only focused on balance today.  Pt did not come with AD.   Challenged static on foam with UE movements with min assist.  pt with intermittent HHA with NBOS with head turns.  Improved ability to catch self today when LOB/leaning. Pt with constant cues to gaze forward instead of down with all dynamic activities.  Noted fatigue at end of session.    Personal Factors and Comorbidities Comorbidity 1;Comorbidity 2;Comorbidity 3+;Fitness    Comorbidities parkingsons, history of falls, low back pain    Examination-Activity Limitations Squat;Stairs;Stand;Transfers;Lift;Locomotion Level;Carry;Bend    Examination-Participation Restrictions Cleaning;Meal Prep    Stability/Clinical Decision Making Evolving/Moderate complexity    Rehab Potential Good    PT Frequency 2x / week    PT Duration --   5 weeks   PT Treatment/Interventions ADLs/Self Care Home Management;Aquatic Therapy;Cryotherapy;Electrical Stimulation;Moist Heat;Traction;Balance training;Therapeutic exercise;Therapeutic activities;Functional mobility training;Stair training;Gait training;DME Instruction;Neuromuscular re-education;Patient/family education;Manual techniques;Passive range of motion    PT Next Visit Plan f/u  with cane use at all times,  forward reaching with full return to upright, perturbations with patient correcting for LOB.    PT Home Exercise Plan 09/28/19:  Use cane at all times, slow down gait and improve sequence.  Improve postural awareness 5times day.  Seated rows with RTB. heel/toe raises.  Standing NBOS by counter/sink.           Patient will benefit from skilled therapeutic intervention in order to improve the following deficits and impairments:  Pain, Decreased range of motion, Decreased balance, Decreased coordination, Decreased activity tolerance, Abnormal gait, Decreased endurance, Decreased knowledge of precautions, Decreased strength, Difficulty walking  Visit Diagnosis: Unsteadiness on feet  Muscle weakness (generalized)  Difficulty in walking, not elsewhere classified     Problem List Patient Active Problem List   Diagnosis Date Noted  . GERD (gastroesophageal reflux disease)   . History of Nissen fundoplication   . IBS (irritable bowel syndrome) 02/05/2018  . Diarrhea 06/16/2017  . N&V (nausea and vomiting) 06/16/2017  . Numbness 08/18/2015  . Polyneuropathy 08/18/2015  . Menopausal syndrome (hot flushes) 06/15/2015  . Tremor, essential 06/15/2015  . Gastroesophageal reflux disease with esophagitis 06/15/2015  . Bilateral carpal tunnel syndrome 06/15/2015  . Resting tremor 06/15/2015  . Scoliosis of thoracic spine 11/05/2013  . Spinal stenosis, unspecified region other than cervical 12/03/2012  . Spondylosis of unspecified site without mention of myelopathy 12/03/2012  . Parkinson disease ([place])   . Back pain   . Seasonal allergies    Teena Irani, PTA/CLT [phone] Teena Irani 11/30/2019, 12:34 PM  [facility] [address] [place], [place], [address] Phone: [phone]   Fax:  [phone]  Name: [name] MRN: [id number] Date of Birth: January 09, 1934

## 2019-12-02 ENCOUNTER — Ambulatory Visit (HOSPITAL_COMMUNITY): Payer: Medicare Other

## 2019-12-02 ENCOUNTER — Other Ambulatory Visit: Payer: Self-pay

## 2019-12-02 ENCOUNTER — Encounter (HOSPITAL_COMMUNITY): Payer: Self-pay

## 2019-12-02 DIAGNOSIS — R2681 Unsteadiness on feet: Secondary | ICD-10-CM

## 2019-12-02 DIAGNOSIS — R293 Abnormal posture: Secondary | ICD-10-CM

## 2019-12-02 DIAGNOSIS — R262 Difficulty in walking, not elsewhere classified: Secondary | ICD-10-CM

## 2019-12-02 DIAGNOSIS — M6281 Muscle weakness (generalized): Secondary | ICD-10-CM

## 2019-12-02 NOTE — Therapy (Signed)
[place], [place], [address] Phone: [phone]   Fax:  [phone]  Physical Therapy Treatment  Patient Details  Name: [name] MRN: [id number] Date of Birth: 12-14-1933 Referring Provider (PT):  Ward Givens NP   Encounter Date: 12/02/2019   PT End of Session - 12/02/19 1331    Visit Number 17    Number of Visits 20    Date for PT Re-Evaluation 12/07/19   PN 11/02/19   Authorization Type UHC Medicare, no co-ins, Copay $35, No auth, PT includes AQ coverage, No VL    Progress Note Due on Visit 20    PT Start Time 1327    PT Stop Time 1400    PT Time Calculation (min) 33 min    Equipment Utilized During Treatment Gait belt    Activity Tolerance Patient tolerated treatment well    Behavior During Therapy WFL for tasks assessed/performed           Past Medical History:  Diagnosis Date  . Ankle swelling    takes torsemide prn  . Anxiety   . Arthritis   . Asthma    allergy induced  . Back pain   . Complication of anesthesia   . Depression   . GERD (gastroesophageal reflux disease)   . Hypercholesterolemia   . Hypothyroid   . Parkinson disease ([place])   . PONV (postoperative nausea and vomiting)   . Seasonal allergies     Past Surgical History:  Procedure Laterality Date  . ABDOMINAL HYSTERECTOMY    . CARPAL TUNNEL RELEASE Right 10/26/2015   Procedure: CARPAL TUNNEL RELEASE;  Surgeon: Daryll Brod, MD;  Location: [facility];  Service: Orthopedics;  Laterality: Right;  . CARPOMETACARPEL SUSPENSION PLASTY Right 10/26/2015   Procedure: RIGHT HAND TRAPEZIECTOMY WITH THUMB METACARPEL SUSPENSION PLASTY;  Surgeon: Daryll Brod, MD;  Location: [facility];  Service: Orthopedics;  Laterality: Right;  . CATARACT EXTRACTION W/PHACO  12/23/2011   Procedure: CATARACT EXTRACTION PHACO AND INTRAOCULAR LENS PLACEMENT (IOC);  Surgeon: Williams Che, MD;  Location: [facility];  Service:  Ophthalmology;  Laterality: Right;  CDE=5.16  . CHOLECYSTECTOMY    . EYE SURGERY     cataract extraction of left eye-[facility]-2002  . KNEE ARTHROSCOPY     right  . NISSEN FUNDOPLICATION     [facility]  . TRIGGER FINGER RELEASE Right 10/26/2015   Procedure: RELEASE A-1 PULLEY RIGHT RING FINGER;  Surgeon: Daryll Brod, MD;  Location: [facility];  Service: Orthopedics;  Laterality: Right;    There were no vitals filed for this visit.   Subjective Assessment - 12/02/19 1330    Subjective Pt arrived late for apt today without cane.  Stated she was watching grandkids and lost track of time.  No reports of pain or recent fall.    Pertinent History parkinson's    Patient Stated Goals to have less falls and more stamina    Currently in Pain? No/denies                             [facility] Adult PT Treatment/Exercise - 12/02/19 0001      Knee/Hip Exercises: Standing   Forward Lunges Both;15 reps    Forward Lunges Limitations alternating no HHA    Side Lunges Both    Side Lunges Limitations alternating no HHA    Functional Squat 10 reps    Functional  Squat Limitations proper lifting red weighted ball    Other Standing Knee Exercises Reaching forward then large step forward for recovery    Other Standing Knee Exercises narrow BOS on black foam with forward reaching to sticky note targets on wall 4x5 B - 2 notes - focus on full upright extension after each rep- CGA and over a table for safety.                    PT Short Term Goals - 11/02/19 1332      PT SHORT TERM GOAL #1   Title Patient will be independent in self management strategies to improve quality of life and functional outcomes.    Baseline past few days she has not been doing but prior too she was independent    Time 3    Period Weeks    Status On-going    Target Date 10/12/19      PT SHORT TERM GOAL #2   Title Patient will report at least 25% improvement in overall symptoms and/or functional  ability.    Baseline 50% better in some ways (stronger) but in falls 0% better    Time 3    Period Weeks    Status On-going    Target Date 10/12/19             PT Long Term Goals - 11/02/19 1333      PT LONG TERM GOAL #1   Title Patient will report at least 50% improvement in overall symptoms and/or functional ability.    Baseline see above    Time 6    Period Weeks    Status On-going      PT LONG TERM GOAL #2   Title Patient will report no falls in the last 4 weeks to demonstrate improved balance at home and awareness of fall risk.    Baseline 2 falls in the last 4 weeks, 1 fall about every 2 weeks    Time 6    Period Weeks    Status On-going      PT LONG TERM GOAL #3   Title Patient will score with at least 46 points on BERG to decrease risk of falls from significant to moderate.    Baseline initial 42; current 46    Time 6    Period Weeks    Status Achieved                 Plan - 12/02/19 1349    Clinical Impression Statement Pt late for apt, forgot AD.  Pt educated on importance of having cane with her at all time for fall prevention.  Session focus with forward reaching activities with LE recovery with min A required x3 LOB episodes during exercise.  No reoprts of pain through session, was limited by fatigue.    Personal Factors and Comorbidities Comorbidity 1;Comorbidity 2;Comorbidity 3+;Fitness    Comorbidities parkingsons, history of falls, low back pain    Examination-Activity Limitations Squat;Stairs;Stand;Transfers;Lift;Locomotion Level;Carry;Bend    Examination-Participation Restrictions Cleaning;Meal Prep    Stability/Clinical Decision Making Evolving/Moderate complexity    Clinical Decision Making Moderate    Rehab Potential Good    PT Frequency 2x / week    PT Duration --   5 weeks   PT Treatment/Interventions ADLs/Self Care Home Management;Aquatic Therapy;Cryotherapy;Electrical Stimulation;Moist Heat;Traction;Balance training;Therapeutic  exercise;Therapeutic activities;Functional mobility training;Stair training;Gait training;DME Instruction;Neuromuscular re-education;Patient/family education;Manual techniques;Passive range of motion    PT Next Visit Plan Reassess 12/07/19.  f/u with cane  use at all times,  forward reaching with full return to upright, perturbations with patient correcting for LOB.    PT Home Exercise Plan 09/28/19:  Use cane at all times, slow down gait and improve sequence.  Improve postural awareness 5times day.  Seated rows with RTB. heel/toe raises.  Standing NBOS by counter/sink.           Patient will benefit from skilled therapeutic intervention in order to improve the following deficits and impairments:  Pain, Decreased range of motion, Decreased balance, Decreased coordination, Decreased activity tolerance, Abnormal gait, Decreased endurance, Decreased knowledge of precautions, Decreased strength, Difficulty walking  Visit Diagnosis: Muscle weakness (generalized)  Difficulty in walking, not elsewhere classified  Unsteadiness on feet  Abnormal posture     Problem List Patient Active Problem List   Diagnosis Date Noted  . GERD (gastroesophageal reflux disease)   . History of Nissen fundoplication   . IBS (irritable bowel syndrome) 02/05/2018  . Diarrhea 06/16/2017  . N&V (nausea and vomiting) 06/16/2017  . Numbness 08/18/2015  . Polyneuropathy 08/18/2015  . Menopausal syndrome (hot flushes) 06/15/2015  . Tremor, essential 06/15/2015  . Gastroesophageal reflux disease with esophagitis 06/15/2015  . Bilateral carpal tunnel syndrome 06/15/2015  . Resting tremor 06/15/2015  . Scoliosis of thoracic spine 11/05/2013  . Spinal stenosis, unspecified region other than cervical 12/03/2012  . Spondylosis of unspecified site without mention of myelopathy 12/03/2012  . Parkinson disease ([place])   . Back pain   . Seasonal allergies    Ihor Austin, LPTA/CLT; CBIS [phone]  Aldona Lento 12/02/2019, 2:15 PM  [facility] [place], [place], [address] Phone: [phone]   Fax:  [phone]  Name: LUCYLE ALUMBAUGH MRN: [id number] Date of Birth: 07-17-1933

## 2019-12-07 ENCOUNTER — Ambulatory Visit (HOSPITAL_COMMUNITY): Payer: Medicare Other | Admitting: Physical Therapy

## 2019-12-07 ENCOUNTER — Other Ambulatory Visit: Payer: Self-pay

## 2019-12-07 ENCOUNTER — Encounter (HOSPITAL_COMMUNITY): Payer: Self-pay | Admitting: Physical Therapy

## 2019-12-07 DIAGNOSIS — R2681 Unsteadiness on feet: Secondary | ICD-10-CM | POA: Diagnosis not present

## 2019-12-07 DIAGNOSIS — R293 Abnormal posture: Secondary | ICD-10-CM

## 2019-12-07 DIAGNOSIS — R262 Difficulty in walking, not elsewhere classified: Secondary | ICD-10-CM

## 2019-12-07 DIAGNOSIS — M6281 Muscle weakness (generalized): Secondary | ICD-10-CM

## 2019-12-07 NOTE — Therapy (Signed)
[place], [place], [address] Phone: [phone]   Fax:  [phone]  Physical Therapy Treatment, Progress Note and Discharge Note  Patient Details  Name: [name] MRN: [id number] Date of Birth: 1933/07/11 Referring Provider (PT):  Ward Givens NP  PHYSICAL THERAPY DISCHARGE SUMMARY  Visits from Start of Care:18  Current functional level related to goals / functional outcomes: All goals met   Remaining deficits: balance   Education / Equipment: See below Plan: Patient agrees to discharge.  Patient goals were met. Patient is being discharged due to meeting the stated rehab goals.  ?????     .Progress Note Reporting Period 11/02/19 to 12/07/19  See note below for Objective Data and Assessment of Progress/Goals.       Encounter Date: 12/07/2019   PT End of Session - 12/07/19 1329    Visit Number 18    Number of Visits 20    Date for PT Re-Evaluation 12/07/19   PN 11/02/19   Authorization Type UHC Medicare, no co-ins, Copay $35, No auth, PT includes AQ coverage, No VL    Progress Note Due on Visit 20    PT Start Time 1330   15 minutes late   PT Stop Time 1355    PT Time Calculation (min) 25 min    Equipment Utilized During Treatment Gait belt    Activity Tolerance Patient tolerated treatment well    Behavior During Therapy WFL for tasks assessed/performed           Past Medical History:  Diagnosis Date  . Ankle swelling    takes torsemide prn  . Anxiety   . Arthritis   . Asthma    allergy induced  . Back pain   . Complication of anesthesia   . Depression   . GERD (gastroesophageal reflux disease)   . Hypercholesterolemia   . Hypothyroid   . Parkinson disease ([place])   . PONV (postoperative nausea and vomiting)   . Seasonal allergies     Past Surgical History:  Procedure Laterality Date  . ABDOMINAL HYSTERECTOMY    . CARPAL TUNNEL RELEASE Right 10/26/2015   Procedure: CARPAL  TUNNEL RELEASE;  Surgeon: Daryll Brod, MD;  Location: [facility];  Service: Orthopedics;  Laterality: Right;  . CARPOMETACARPEL SUSPENSION PLASTY Right 10/26/2015   Procedure: RIGHT HAND TRAPEZIECTOMY WITH THUMB METACARPEL SUSPENSION PLASTY;  Surgeon: Daryll Brod, MD;  Location: [facility];  Service: Orthopedics;  Laterality: Right;  . CATARACT EXTRACTION W/PHACO  12/23/2011   Procedure: CATARACT EXTRACTION PHACO AND INTRAOCULAR LENS PLACEMENT (IOC);  Surgeon: Williams Che, MD;  Location: [facility];  Service: Ophthalmology;  Laterality: Right;  CDE=5.16  . CHOLECYSTECTOMY    . EYE SURGERY     cataract extraction of left eye-[facility]-2002  . KNEE ARTHROSCOPY     right  . NISSEN FUNDOPLICATION     [facility]  . TRIGGER FINGER RELEASE Right 10/26/2015   Procedure: RELEASE A-1 PULLEY RIGHT RING FINGER;  Surgeon: Daryll Brod, MD;  Location: [facility];  Service: Orthopedics;  Laterality: Right;    There were no vitals filed for this visit.   Subjective Assessment - 12/07/19 1333    Subjective States she is feeling ok. States she has been busy and she needs to stop her therapy for a while because it is just too much getting in. States she has not fallen in the last two weeks.    Pertinent History  parkinson's    Patient Stated Goals to have less falls and more stamina              [facility] PT Assessment - 12/07/19 0001      Assessment   Medical Diagnosis balance deficits    Referring Provider (PT)  Ward Givens NP      Dynamic Gait Index   Level Surface Normal    Change in Gait Speed Normal    Gait with Horizontal Head Turns Normal    Gait with Vertical Head Turns Normal    Gait and Pivot Turn Normal    Step Over Obstacle Mild Impairment    Step Around Obstacles Normal    Steps Mild Impairment    Total Score 22    DGI comment: no cane required, requires railing for stairs, loss of balance with step over                                   PT Education - 12/07/19 1343    Education Details reviewed HEP, answered all questiosn, reviewed DGI score with patient    Person(s) Educated Patient    Methods Explanation    Comprehension Verbalized understanding            PT Short Term Goals - 12/07/19 1334      PT SHORT TERM GOAL #1   Title Patient will be independent in self management strategies to improve quality of life and functional outcomes.    Baseline states she is doing her exercises every other day.    Time 3    Period Weeks    Status Achieved    Target Date 10/12/19      PT SHORT TERM GOAL #2   Title Patient will report at least 25% improvement in overall symptoms and/or functional ability.    Baseline 75% better    Time 3    Period Weeks    Status Achieved    Target Date 10/12/19             PT Long Term Goals - 12/07/19 1336      PT LONG TERM GOAL #1   Title Patient will report at least 50% improvement in overall symptoms and/or functional ability.    Baseline 75% better    Time 6    Period Weeks    Status Achieved      PT LONG TERM GOAL #2   Title Patient will report no falls in the last 4 weeks to demonstrate improved balance at home and awareness of fall risk.    Baseline last fall was on 5/19    Time 6    Period Weeks    Status Achieved      PT LONG TERM GOAL #3   Title Patient will score with at least 46 points on BERG to decrease risk of falls from significant to moderate.    Baseline initial 42; current 46    Time 6    Period Weeks    Status Achieved                 Plan - 12/07/19 1343    Clinical Impression Statement Patient more confident in self and she reports she has learned better habits and feels her home exercises are very helpful. Patient has met all goals at this time. Reviewed home program and answered all questions. Patient to discharge from PT to HEP  at this time.    Personal Factors and Comorbidities Comorbidity  1;Comorbidity 2;Comorbidity 3+;Fitness    Comorbidities parkingsons, history of falls, low back pain    Examination-Activity Limitations Squat;Stairs;Stand;Transfers;Lift;Locomotion Level;Carry;Bend    Examination-Participation Restrictions Cleaning;Meal Prep    Stability/Clinical Decision Making Evolving/Moderate complexity    Rehab Potential Good    PT Frequency 2x / week    PT Duration --   5 weeks   PT Treatment/Interventions ADLs/Self Care Home Management;Aquatic Therapy;Cryotherapy;Electrical Stimulation;Moist Heat;Traction;Balance training;Therapeutic exercise;Therapeutic activities;Functional mobility training;Stair training;Gait training;DME Instruction;Neuromuscular re-education;Patient/family education;Manual techniques;Passive range of motion    PT Next Visit Plan patient to discharge from PT to HEP at this time.    PT Home Exercise Plan 09/28/19:  Use cane at all times, slow down gait and improve sequence.  Improve postural awareness 5times day.  Seated rows with RTB. heel/toe raises.  Standing NBOS by counter/sink.           Patient will benefit from skilled therapeutic intervention in order to improve the following deficits and impairments:  Pain, Decreased range of motion, Decreased balance, Decreased coordination, Decreased activity tolerance, Abnormal gait, Decreased endurance, Decreased knowledge of precautions, Decreased strength, Difficulty walking  Visit Diagnosis: Muscle weakness (generalized)  Difficulty in walking, not elsewhere classified  Unsteadiness on feet  Abnormal posture     Problem List Patient Active Problem List   Diagnosis Date Noted  . GERD (gastroesophageal reflux disease)   . History of Nissen fundoplication   . IBS (irritable bowel syndrome) 02/05/2018  . Diarrhea 06/16/2017  . N&V (nausea and vomiting) 06/16/2017  . Numbness 08/18/2015  . Polyneuropathy 08/18/2015  . Menopausal syndrome (hot flushes) 06/15/2015  . Tremor, essential  06/15/2015  . Gastroesophageal reflux disease with esophagitis 06/15/2015  . Bilateral carpal tunnel syndrome 06/15/2015  . Resting tremor 06/15/2015  . Scoliosis of thoracic spine 11/05/2013  . Spinal stenosis, unspecified region other than cervical 12/03/2012  . Spondylosis of unspecified site without mention of myelopathy 12/03/2012  . Parkinson disease ([place])   . Back pain   . Seasonal allergies    1:55 PM, 12/07/19 Jerene Pitch, DPT Physical Therapy with [facility]  [phone] office  [facility] [address] [place], [place], [address] Phone: [phone]   Fax:  [phone]  Name: [name] MRN: [id number] Date of Birth: Apr 27, 1934

## 2019-12-31 ENCOUNTER — Ambulatory Visit
Admission: EM | Admit: 2019-12-31 | Discharge: 2019-12-31 | Disposition: A | Discharge status: Home / Self Care | Payer: Medicare Other | Attending: Emergency Medicine | Admitting: Emergency Medicine

## 2019-12-31 ENCOUNTER — Other Ambulatory Visit: Payer: Self-pay

## 2019-12-31 ENCOUNTER — Encounter: Payer: Self-pay | Admitting: Emergency Medicine

## 2019-12-31 DIAGNOSIS — Z5189 Encounter for other specified aftercare: Secondary | ICD-10-CM

## 2019-12-31 DIAGNOSIS — L97921 Non-pressure chronic ulcer of unspecified part of left lower leg limited to breakdown of skin: Secondary | ICD-10-CM

## 2019-12-31 MED ORDER — DOXYCYCLINE HYCLATE 100 MG PO CAPS: 100.0000 mg | ORAL_CAPSULE | Freq: Two times a day (BID) | ORAL | 0 refills | Status: DC

## 2019-12-31 MED ORDER — MUPIROCIN 2 % EX OINT: 1.0000 "application " | TOPICAL_OINTMENT | Freq: Two times a day (BID) | CUTANEOUS | 0 refills | Status: DC

## 2019-12-31 NOTE — ED Triage Notes (Signed)
Pt hit RT lower leg on a car door x 25month ago. There are two wounds, the one closer to the knee is still open, sore and draining brown, the one closer to the foot has scabbed over but has redness around area. Pt has been cleaning wounds with betadine, peroxide and applying silvadene cream daily.

## 2019-12-31 NOTE — Discharge Instructions (Signed)
Wash with warm water and mild soap Apply bactroban after dressing changes Doxycycline prescribed for infection Take OTC ibuprofen or tylenol as needed for pain relief Follow up with wound management for further evaluation and management Return or go to the ED if you have any new or worsening symptoms such as increased pain, redness, swelling, drainage, discharge, decreased range of motion of extremity, etc..

## 2019-12-31 NOTE — ED Provider Notes (Signed)
[place]   [id number] 12/31/19 Arrival Time: 1740  CC: Wound check  SUBJECTIVE:  [name] is a 86 y.o. female who presents with RLE multiple wound check.  Hit RLE on car door 2 weeks ago, and fell in yard 2 months ago. Present with two wounds she would like evaluated.  Wound from fall 2 months ago has recently become painful with surrounding redness.  Sore to the touch.  Has been using silvadene cream without relief.  Reports hx of poor wound healing in the past.  Denies fever, chills, nausea, vomiting, purulent drainage.   ROS: As per HPI.  All other pertinent ROS negative.     Past Medical History:  Diagnosis Date  . Ankle swelling    takes torsemide prn  . Anxiety   . Arthritis   . Asthma    allergy induced  . Back pain   . Complication of anesthesia   . Depression   . GERD (gastroesophageal reflux disease)   . Hypercholesterolemia   . Hypothyroid   . Parkinson disease ([place])   . PONV (postoperative nausea and vomiting)   . Seasonal allergies    Past Surgical History:  Procedure Laterality Date  . ABDOMINAL HYSTERECTOMY    . CARPAL TUNNEL RELEASE Right 10/26/2015   Procedure: CARPAL TUNNEL RELEASE;  Surgeon: Daryll Brod, MD;  Location: [place];  Service: Orthopedics;  Laterality: Right;  . CARPOMETACARPEL SUSPENSION PLASTY Right 10/26/2015   Procedure: RIGHT HAND TRAPEZIECTOMY WITH THUMB METACARPEL SUSPENSION PLASTY;  Surgeon: Daryll Brod, MD;  Location: [place];  Service: Orthopedics;  Laterality: Right;  . CATARACT EXTRACTION W/PHACO  12/23/2011   Procedure: CATARACT EXTRACTION PHACO AND INTRAOCULAR LENS PLACEMENT (IOC);  Surgeon: Williams Che, MD;  Location: [facility];  Service: Ophthalmology;  Laterality: Right;  CDE=5.16  . CHOLECYSTECTOMY    . EYE SURGERY     cataract extraction of left eye-[facility]-2002  . KNEE ARTHROSCOPY     right  . NISSEN FUNDOPLICATION     [facility]  . TRIGGER FINGER RELEASE Right 10/26/2015    Procedure: RELEASE A-1 PULLEY RIGHT RING FINGER;  Surgeon: Daryll Brod, MD;  Location: [place];  Service: Orthopedics;  Laterality: Right;   Allergies  Allergen Reactions  . Cephalosporins Rash  . Latex Rash    Breaks out then gets infected  . Lactose Intolerance (Gi) Nausea And Vomiting  . Lactulose Nausea And Vomiting  . Cephalexin Rash  . Levofloxacin Anxiety and Rash  . Meperidine And Related Nausea And Vomiting    Needed Phenergan to combat nausea.  . Omeprazole Rash   No current facility-administered medications on file prior to encounter.   Current Outpatient Medications on File Prior to Encounter  Medication Sig Dispense Refill  . albuterol (PROVENTIL) (2.5 MG/3ML) 0.083% nebulizer solution Take 2.5 mg by nebulization every 6 (six) hours as needed for wheezing or shortness of breath.    . Biotin 10 MG CAPS Take 1 capsule by mouth daily.     Marland Kitchen bismuth subsalicylate (PEPTO BISMOL) 262 MG/15ML suspension Take 30 mLs by mouth as needed.    . carbidopa-levodopa (SINEMET IR) 25-100 MG tablet TAKE 1 TABLET BY MOUTH 4  TIMES DAILY 360 tablet 3  . cetirizine-pseudoephedrine (ZYRTEC-D) 5-120 MG per tablet Take 1 tablet by mouth daily. For sinus     . Cholecalciferol (D3-1000 PO) Take by mouth daily.    Marland Kitchen escitalopram (LEXAPRO) 5 MG tablet Take 10 mg by mouth daily.    Marland Kitchen  levothyroxine (SYNTHROID, LEVOTHROID) 25 MCG tablet Take 25 mcg by mouth daily.    Marland Kitchen loperamide (IMODIUM) 2 MG capsule Take 2-4 mg by mouth daily.     . mometasone (NASONEX) 50 MCG/ACT nasal spray Place 2 sprays into the nose 2 (two) times daily as needed. For allergies    . montelukast (SINGULAIR) 10 MG tablet Take 10 mg by mouth at bedtime.      . Multiple Vitamins-Minerals (CAL-DAY 1000 PO) Take by mouth daily.    . naproxen (NAPROSYN) 500 MG tablet Take 500 mg by mouth 2 (two) times daily as needed.     . potassium chloride (MICRO-K) 10 MEQ CR capsule Take 20 mEq by mouth daily.     Marland Kitchen QVAR REDIHALER  80 MCG/ACT AERB INHALATION TAKE 2 PUFFS DAILY TO PREVENT BREATHING PROBLEMS.  7  . RABEprazole (ACIPHEX) 20 MG tablet TAKE 1 TABLET (20 MG TOTAL) BY MOUTH DAILY BEFORE BREAKFAST. 90 tablet 3  . torsemide (DEMADEX) 10 MG tablet Take 10 mg by mouth daily. 1/2 to 1 tab daily    . zolpidem (AMBIEN) 5 MG tablet Take 5 mg by mouth at bedtime.     Social History   Socioeconomic History  . Marital status: Married    Spouse name: Not on file  . Number of children: 2  . Years of education: Not on file  . Highest education level: Not on file  Occupational History    Comment: retired  Tobacco Use  . Smoking status: Never Smoker  . Smokeless tobacco: Never Used  Substance and Sexual Activity  . Alcohol use: Yes    Alcohol/week: 1.0 standard drink    Types: 1 Glasses of wine per week    Comment: occ  . Drug use: No  . Sexual activity: Not on file  Other Topics Concern  . Not on file  Social History Narrative   Patient lives at home alone and she is widowed.   Retired.    Education one year business course.   Right handed.   Caffeine two cup of coffee daily and one sweet tea.   Social Determinants of Health   Financial Resource Strain:   . Difficulty of Paying Living Expenses:   Food Insecurity:   . Worried About Charity fundraiser in the Last Year:   . Arboriculturist in the Last Year:   Transportation Needs:   . Film/video editor (Medical):   Marland Kitchen Lack of Transportation (Non-Medical):   Physical Activity:   . Days of Exercise per Week:   . Minutes of Exercise per Session:   Stress:   . Feeling of Stress :   Social Connections:   . Frequency of Communication with Friends and Family:   . Frequency of Social Gatherings with Friends and Family:   . Attends Religious Services:   . Active Member of Clubs or Organizations:   . Attends Archivist Meetings:   Marland Kitchen Marital Status:   Intimate Partner Violence:   . Fear of Current or Ex-Partner:   . Emotionally Abused:   Marland Kitchen  Physically Abused:   . Sexually Abused:    Family History  Problem Relation Age of Onset  . Stroke Mother   . COPD Father   . Stroke Father   . Colon cancer Neg Hx      OBJECTIVE:  Vitals:   12/31/19 1504 12/31/19 1506  BP:  124/81  Pulse:  78  Resp:  18  Temp:  97.7  F (36.5 C)  TempSrc:  Oral  SpO2:  97%  Weight: 165 lb (74.8 kg)   Height: 5' 2"  (1.575 m)     General appearance: alert; no distress Lungs: Normal respiratory effort CV: Dorsalis pedis pulse 2+ Skin: Two ulcers to anterior aspect of RLE, apx 1 cm in diameter, distal ulcer with 4 cm of surrounding erythema, TTP; proximal ulcer without surrounding erythema; no obvious purulent drainage or bleeding Psychological: alert and cooperative; normal mood and affect  ASSESSMENT & PLAN:  1. Skin ulcer of left lower leg, limited to breakdown of skin ([place])   2. Visit for wound check    Meds ordered this encounter  Medications  . doxycycline (VIBRAMYCIN) 100 MG capsule    Sig: Take 1 capsule (100 mg total) by mouth 2 (two) times daily.    Dispense:  20 capsule    Refill:  0    Order Specific Question:   Supervising Provider    Answer:   Raylene Everts [[id number]]   Wash with warm water and mild soap Apply bactroban after dressing changes Doxycycline prescribed for infection Take OTC ibuprofen or tylenol as needed for pain relief Follow up with wound management for further evaluation and management Return or go to the ED if you have any new or worsening symptoms such as increased pain, redness, swelling, drainage, discharge, decreased range of motion of extremity, etc..   Reviewed expectations re: course of current medical issues. Questions answered. Outlined signs and symptoms indicating need for more acute intervention. Patient verbalized understanding. After Visit Summary given.   Lestine Box, PA-C 12/31/19 1533

## 2020-01-21 ENCOUNTER — Ambulatory Visit (INDEPENDENT_AMBULATORY_CARE_PROVIDER_SITE_OTHER): Payer: Medicare Other

## 2020-01-21 ENCOUNTER — Encounter: Payer: Self-pay | Admitting: Emergency Medicine

## 2020-01-21 ENCOUNTER — Ambulatory Visit
Admission: EM | Admit: 2020-01-21 | Discharge: 2020-01-21 | Disposition: A | Discharge status: Home / Self Care | Payer: Medicare Other | Attending: Emergency Medicine | Admitting: Emergency Medicine

## 2020-01-21 ENCOUNTER — Other Ambulatory Visit: Payer: Self-pay

## 2020-01-21 DIAGNOSIS — M25532 Pain in left wrist: Secondary | ICD-10-CM | POA: Diagnosis not present

## 2020-01-21 DIAGNOSIS — W19XXXA Unspecified fall, initial encounter: Secondary | ICD-10-CM

## 2020-01-21 DIAGNOSIS — L0390 Cellulitis, unspecified: Secondary | ICD-10-CM

## 2020-01-21 MED ORDER — SULFAMETHOXAZOLE-TRIMETHOPRIM 800-160 MG PO TABS
1.0000 | ORAL_TABLET | Freq: Two times a day (BID) | ORAL | 0 refills | Status: AC
Start: 2020-01-21 — End: 2020-01-28

## 2020-01-21 MED ORDER — SULFAMETHOXAZOLE-TRIMETHOPRIM 800-160 MG PO TABS
1.0000 | ORAL_TABLET | Freq: Two times a day (BID) | ORAL | 0 refills | Status: DC
Start: 2020-01-21 — End: 2020-01-21

## 2020-01-21 NOTE — ED Triage Notes (Signed)
Pt tripped and fell last night, now is pain and swelling to lateral side of LT wrist

## 2020-01-21 NOTE — Discharge Instructions (Addendum)
Take OTC Tylenol as needed for osteoarthritis pain Follow RICE instruction that is  Attached Follow-up with PCP Take antibiotic for cellulitis/take as directed

## 2020-01-21 NOTE — ED Provider Notes (Addendum)
[place]   [id number] 01/21/20 Arrival Time: 4098   Chief Complaint  Patient presents with  . Wrist Pain     SUBJECTIVE: History from: patient.  [name] is a 86 y.o. female who presented to the urgent care with a complaint of left wrist/hand pain after fall last night.  Denies any precipitating event.  She localizes the pain to the left wrist/hand.  She describes the pain as constant and achy.  She has tried OTC medications without relief.  Her symptoms are made worse with ROM.  He denies similar symptoms in the past.    She is also complaining of right lower extremities warm that has not been healing.  Was seen in urgent care and was prescribed doxycycline with no improvement.  Denies alleviating or worsening factors.  Report history of poor wound healing in the past.  Denies chills, fever, nausea, vomiting, diarrhea.   ROS: As per HPI.  All other pertinent ROS negative.     Past Medical History:  Diagnosis Date  . Ankle swelling    takes torsemide prn  . Anxiety   . Arthritis   . Asthma    allergy induced  . Back pain   . Complication of anesthesia   . Depression   . GERD (gastroesophageal reflux disease)   . Hypercholesterolemia   . Hypothyroid   . Parkinson disease ([place])   . PONV (postoperative nausea and vomiting)   . Seasonal allergies    Past Surgical History:  Procedure Laterality Date  . ABDOMINAL HYSTERECTOMY    . CARPAL TUNNEL RELEASE Right 10/26/2015   Procedure: CARPAL TUNNEL RELEASE;  Surgeon: Daryll Brod, MD;  Location: [place];  Service: Orthopedics;  Laterality: Right;  . CARPOMETACARPEL SUSPENSION PLASTY Right 10/26/2015   Procedure: RIGHT HAND TRAPEZIECTOMY WITH THUMB METACARPEL SUSPENSION PLASTY;  Surgeon: Daryll Brod, MD;  Location: [place];  Service: Orthopedics;  Laterality: Right;  . CATARACT EXTRACTION W/PHACO  12/23/2011   Procedure: CATARACT EXTRACTION PHACO AND INTRAOCULAR LENS PLACEMENT  (IOC);  Surgeon: Williams Che, MD;  Location: [facility];  Service: Ophthalmology;  Laterality: Right;  CDE=5.16  . CHOLECYSTECTOMY    . EYE SURGERY     cataract extraction of left eye-[facility]-2002  . KNEE ARTHROSCOPY     right  . NISSEN FUNDOPLICATION     [facility]  . TRIGGER FINGER RELEASE Right 10/26/2015   Procedure: RELEASE A-1 PULLEY RIGHT RING FINGER;  Surgeon: Daryll Brod, MD;  Location: [place];  Service: Orthopedics;  Laterality: Right;   Allergies  Allergen Reactions  . Cephalosporins Rash  . Latex Rash    Breaks out then gets infected  . Lactose Intolerance (Gi) Nausea And Vomiting  . Lactulose Nausea And Vomiting  . Cephalexin Rash  . Levofloxacin Anxiety and Rash  . Meperidine And Related Nausea And Vomiting    Needed Phenergan to combat nausea.  . Omeprazole Rash   No current facility-administered medications on file prior to encounter.   Current Outpatient Medications on File Prior to Encounter  Medication Sig Dispense Refill  . albuterol (PROVENTIL) (2.5 MG/3ML) 0.083% nebulizer solution Take 2.5 mg by nebulization every 6 (six) hours as needed for wheezing or shortness of breath.    . Biotin 10 MG CAPS Take 1 capsule by mouth daily.     Marland Kitchen bismuth subsalicylate (PEPTO BISMOL) 262 MG/15ML suspension Take 30 mLs by mouth as needed.    . carbidopa-levodopa (SINEMET IR) 25-100 MG tablet  TAKE 1 TABLET BY MOUTH 4  TIMES DAILY 360 tablet 3  . cetirizine-pseudoephedrine (ZYRTEC-D) 5-120 MG per tablet Take 1 tablet by mouth daily. For sinus     . Cholecalciferol (D3-1000 PO) Take by mouth daily.    Marland Kitchen doxycycline (VIBRAMYCIN) 100 MG capsule Take 1 capsule (100 mg total) by mouth 2 (two) times daily. 20 capsule 0  . escitalopram (LEXAPRO) 5 MG tablet Take 10 mg by mouth daily.    Marland Kitchen levothyroxine (SYNTHROID, LEVOTHROID) 25 MCG tablet Take 25 mcg by mouth daily.    Marland Kitchen loperamide (IMODIUM) 2 MG capsule Take 2-4 mg by mouth daily.     . mometasone (NASONEX) 50 MCG/ACT  nasal spray Place 2 sprays into the nose 2 (two) times daily as needed. For allergies    . montelukast (SINGULAIR) 10 MG tablet Take 10 mg by mouth at bedtime.      . Multiple Vitamins-Minerals (CAL-DAY 1000 PO) Take by mouth daily.    . mupirocin ointment (BACTROBAN) 2 % Apply 1 application topically 2 (two) times daily. 90 g 0  . naproxen (NAPROSYN) 500 MG tablet Take 500 mg by mouth 2 (two) times daily as needed.     . potassium chloride (MICRO-K) 10 MEQ CR capsule Take 20 mEq by mouth daily.     Marland Kitchen QVAR REDIHALER 80 MCG/ACT AERB INHALATION TAKE 2 PUFFS DAILY TO PREVENT BREATHING PROBLEMS.  7  . RABEprazole (ACIPHEX) 20 MG tablet TAKE 1 TABLET (20 MG TOTAL) BY MOUTH DAILY BEFORE BREAKFAST. 90 tablet 3  . torsemide (DEMADEX) 10 MG tablet Take 10 mg by mouth daily. 1/2 to 1 tab daily    . zolpidem (AMBIEN) 5 MG tablet Take 5 mg by mouth at bedtime.     Social History   Socioeconomic History  . Marital status: Married    Spouse name: Not on file  . Number of children: 2  . Years of education: Not on file  . Highest education level: Not on file  Occupational History    Comment: retired  Tobacco Use  . Smoking status: Never Smoker  . Smokeless tobacco: Never Used  Substance and Sexual Activity  . Alcohol use: Yes    Alcohol/week: 1.0 standard drink    Types: 1 Glasses of wine per week    Comment: occ  . Drug use: No  . Sexual activity: Not on file  Other Topics Concern  . Not on file  Social History Narrative   Patient lives at home alone and she is widowed.   Retired.    Education one year business course.   Right handed.   Caffeine two cup of coffee daily and one sweet tea.   Social Determinants of Health   Financial Resource Strain:   . Difficulty of Paying Living Expenses:   Food Insecurity:   . Worried About Charity fundraiser in the Last Year:   . Arboriculturist in the Last Year:   Transportation Needs:   . Film/video editor (Medical):   Marland Kitchen Lack of  Transportation (Non-Medical):   Physical Activity:   . Days of Exercise per Week:   . Minutes of Exercise per Session:   Stress:   . Feeling of Stress :   Social Connections:   . Frequency of Communication with Friends and Family:   . Frequency of Social Gatherings with Friends and Family:   . Attends Religious Services:   . Active Member of Clubs or Organizations:   . Attends  Club or Organization Meetings:   Marland Kitchen Marital Status:   Intimate Partner Violence:   . Fear of Current or Ex-Partner:   . Emotionally Abused:   Marland Kitchen Physically Abused:   . Sexually Abused:    Family History  Problem Relation Age of Onset  . Stroke Mother   . COPD Father   . Stroke Father   . Colon cancer Neg Hx     OBJECTIVE:  Vitals:   01/21/20 1540  BP: 139/79  Pulse: 85  Resp: 16  Temp: 98.5 F (36.9 C)  TempSrc: Oral  SpO2: 95%     Physical Exam Vitals and nursing note reviewed.  Constitutional:      General: She is not in acute distress.    Appearance: Normal appearance. She is normal weight. She is not ill-appearing, toxic-appearing or diaphoretic.  HENT:     Head: Normocephalic.  Cardiovascular:     Rate and Rhythm: Normal rate and regular rhythm.     Pulses: Normal pulses.     Heart sounds: Normal heart sounds. No murmur heard.  No friction rub. No gallop.   Pulmonary:     Effort: Pulmonary effort is normal. No respiratory distress.     Breath sounds: Normal breath sounds. No stridor. No wheezing, rhonchi or rales.  Chest:     Chest wall: No tenderness.  Musculoskeletal:        General: Tenderness present.     Right wrist: Normal.     Left wrist: Tenderness present.     Comments: The left wrist/hand is with obvious deformity compared to the right wrist/hand.  No surface trauma, ecchymosis, warmth present.  Tenderness to palpation over first metacarpal joint.  Limited range of motion.  Neurovascular status intact.  Neurological:     Mental Status: She is alert and oriented to  person, place, and time.     LABS:  RADIOLOGY .DG Wrist Complete Left  Result Date: 01/21/2020 CLINICAL DATA:  [place] last night injuring the left wrist. EXAM: LEFT WRIST - COMPLETE 3+ VIEW COMPARISON:  Left hand radiographs, 02/17/2010. FINDINGS: No acute fracture. Old fracture of the ulnar styloid, unchanged from the prior study. Joints are normally aligned. There is joint space narrowing, subchondral sclerosis and marginal osteophytes at the trapezium first metacarpal articulation, consistent with osteoarthritis, mildly advanced from the prior study. Soft tissues are unremarkable. IMPRESSION: No acute fracture. No dislocation. First carpal metacarpal articulation osteoarthritis. Electronically Signed   By: Lajean Manes M.D.   On: 01/21/2020 15:56    No results found for this or any previous visit (from the past 24 hour(s)).   ASSESSMENT & PLAN:  1. Fall, initial encounter   2. Wound cellulitis   3. Left wrist pain     Meds ordered this encounter  Medications  . sulfamethoxazole-trimethoprim (BACTRIM DS) 800-160 MG tablet    Sig: Take 1 tablet by mouth 2 (two) times daily for 7 days.    Dispense:  14 tablet    Refill:  0   Right wrist X-ray is negative for bony abnormality including fracture or dislocation.  I have reviewed the x-ray myself and the radiologist interpretation.  I am in agreement with the radiologist interpretation.  Discharge Instructions..  Take OTC Tylenol as needed for osteoarthritis pain Follow RICE instruction that is  Attached Follow-up with PCP Take antibiotic for cellulitis/take as directed Return or go to ED for worsening of symptoms   Reviewed expectations re: course of current medical issues. Questions answered. Outlined signs and  symptoms indicating need for more acute intervention. Patient verbalized understanding. After Visit Summary given.    Note: This document was prepared using Dragon voice recognition software and may include  unintentional dictation errors.      Emerson Monte, FNP 01/21/20 1623    Emerson Monte, FNP 01/21/20 1626    Emerson Monte, FNP 01/21/20 1627

## 2020-02-09 ENCOUNTER — Other Ambulatory Visit: Payer: Self-pay | Admitting: Adult Health

## 2020-05-04 ENCOUNTER — Other Ambulatory Visit: Payer: Self-pay | Admitting: Adult Health

## 2020-06-08 ENCOUNTER — Telehealth: Payer: Medicare Other | Admitting: Physician Assistant

## 2020-06-08 DIAGNOSIS — J069 Acute upper respiratory infection, unspecified: Secondary | ICD-10-CM

## 2020-06-08 MED ORDER — AZITHROMYCIN 250 MG PO TABS: ORAL_TABLET | ORAL | 0 refills | Status: DC

## 2020-06-08 NOTE — Progress Notes (Signed)
We are sorry that you are not feeling well.  Here is how we plan to help!  Based on what you have shared with me it looks like you have sinusitis.  Sinusitis is inflammation and infection in the sinus cavities of the head.  Based on your presentation I believe you most likely have Acute Viral Sinusitis.  Due to the increasing COVID in our community, I also recommend that you get screened for COVID and consider getting tested for this.  This is an infection most likely caused by a virus. There is not specific treatment for viral sinusitis other than to help you with the symptoms until the infection runs its course.  You may use an oral decongestant such as Mucinex D or if you have glaucoma or high blood pressure use plain Mucinex. Saline nasal spray help and can safely be used as often as needed for congestion, please also continue your current nasal spray.  Sinus infections are not as easily transmitted as other respiratory infection, however we still recommend that you avoid close contact with loved ones, especially the very young and elderly.  Remember to wash your hands thoroughly throughout the day as this is the number one way to prevent the spread of infection!  Home Care:  Only take medications as instructed by your medical team.  Do not take these medications with alcohol.  A steam or ultrasonic humidifier can help congestion.  You can place a towel over your head and breathe in the steam from hot water coming from a faucet.  Avoid close contacts especially the very young and the elderly.  Cover your mouth when you cough or sneeze.  Always remember to wash your hands.  Get Help Right Away If:  You develop worsening fever or sinus pain.  You develop a severe head ache or visual changes.  Your symptoms persist after you have completed your treatment plan.  Make sure you  Understand these instructions.  Will watch your condition.  Will get help right away if you are not  doing well or get worse.  Your e-visit answers were reviewed by a board certified advanced clinical practitioner to complete your personal care plan.  Depending on the condition, your plan could have included both over the counter or prescription medications.  If there is a problem please reply  once you have received a response from your provider.  Your safety is important to [place].  If you have drug allergies check your prescription carefully.    You can use MyChart to ask questions about todays visit, request a non-urgent call back, or ask for a work or school excuse for 24 hours related to this e-Visit. If it has been greater than 24 hours you will need to follow up with your provider, or enter a new e-Visit to address those concerns.  You will get an e-mail in the next two days asking about your experience.  I hope that your e-visit has been valuable and will speed your recovery. Thank you for using e-visits.  Greater than 5 minutes, yet less than 10 minutes of time have been spent researching, coordinating, and implementing care for this patient today.  Inda Coke PA-C

## 2020-06-08 NOTE — Addendum Note (Signed)
Addended by: Erlene Quan on: 06/08/2020 03:53 PM   Modules accepted: Orders

## 2020-07-23 ENCOUNTER — Other Ambulatory Visit: Payer: Self-pay | Admitting: Adult Health

## 2020-10-09 ENCOUNTER — Other Ambulatory Visit: Payer: Self-pay | Admitting: Gastroenterology

## 2020-10-10 ENCOUNTER — Encounter: Payer: Self-pay | Admitting: Internal Medicine

## 2020-10-10 NOTE — Telephone Encounter (Signed)
Needs office visit.

## 2020-10-18 ENCOUNTER — Other Ambulatory Visit: Payer: Self-pay | Admitting: Adult Health

## 2021-02-14 ENCOUNTER — Other Ambulatory Visit: Payer: Self-pay

## 2021-02-14 ENCOUNTER — Ambulatory Visit
Admission: EM | Admit: 2021-02-14 | Discharge: 2021-02-14 | Disposition: A | Discharge status: Home / Self Care | Payer: Medicare Other | Attending: Family Medicine | Admitting: Family Medicine

## 2021-02-14 ENCOUNTER — Encounter: Payer: Self-pay | Admitting: Emergency Medicine

## 2021-02-14 DIAGNOSIS — R5383 Other fatigue: Secondary | ICD-10-CM | POA: Diagnosis not present

## 2021-02-14 DIAGNOSIS — N3090 Cystitis, unspecified without hematuria: Secondary | ICD-10-CM | POA: Diagnosis not present

## 2021-02-14 LAB — POCT URINALYSIS DIP (MANUAL ENTRY)
Bilirubin, UA: NEGATIVE
Glucose, UA: NEGATIVE mg/dL
Ketones, POC UA: NEGATIVE mg/dL
Nitrite, UA: POSITIVE — AB
Protein Ur, POC: NEGATIVE mg/dL
Spec Grav, UA: <=1.005 — AB (ref 1.010–1.025)
Urobilinogen, UA: 0.2 E.U./dL
pH, UA: 5.5 (ref 5.0–8.0)

## 2021-02-14 MED ORDER — SULFAMETHOXAZOLE-TRIMETHOPRIM 800-160 MG PO TABS
1.0000 | ORAL_TABLET | Freq: Two times a day (BID) | ORAL | 0 refills | Status: AC
Start: 2021-02-14 — End: 2021-02-19

## 2021-02-14 NOTE — Discharge Instructions (Addendum)
You have had labs (urine culture) sent today. We will call you with any significant abnormalities or if there is need to begin or change treatment or pursue further follow up.  You may also review your test results online through [facility]. If you do not have a MyChart account, instructions to sign up should be on your discharge paperwork.

## 2021-02-14 NOTE — ED Triage Notes (Addendum)
Headache, fatigue, burning with urination , shortness of breath and pain to top of legs x 1 week.

## 2021-02-17 LAB — URINE CULTURE: Culture: >=100000 — AB

## 2021-02-17 NOTE — ED Provider Notes (Signed)
[employer]    ASSESSMENT & PLAN:  1. Fatigue, unspecified type   2. Cystitis    Begin: Meds ordered this encounter  Medications   sulfamethoxazole-trimethoprim (BACTRIM DS) 800-160 MG tablet    Sig: Take 1 tablet by mouth 2 (two) times daily for 5 days.    Dispense:  10 tablet    Refill:  0   No signs of pyelonephritis Urine culture sent. Will follow up with her PCP or here if not showing improvement over the next 48 hours, sooner if needed.  Outlined signs and symptoms indicating need for more acute intervention. Patient verbalized understanding. After Visit Summary given.  SUBJECTIVE:  [name] is a 87 y.o. female who complains of urinary frequency, urgency and dysuria for the past week; with fatigue. Without associated flank pain, fever, chills, vaginal discharge or bleeding. Gross hematuria: not present. No specific aggravating or alleviating factors reported. No LE edema. Normal PO intake without n/v/d. Without specific abdominal pain. Ambulatory without difficulty. OTC treatment: none. H/O UTI: infreq.  LMP: No LMP recorded. Patient has had a hysterectomy.   OBJECTIVE:  Vitals:   02/14/21 1608  BP: 139/74  Pulse: 97  Resp: 19  Temp: 98.9 F (37.2 C)  TempSrc: Oral  SpO2: 92%   General appearance: alert; no distress HENT: oropharynx: moist Lungs: unlabored respirations Back: no CVA tenderness Extremities: no edema; symmetrical with no gross deformities Skin: warm and dry Neurologic: normal gait Psychological: alert and cooperative; normal mood and affect  Labs Reviewed  URINE CULTURE - Abnormal; Notable for the following components:      Result Value   Culture   (*)    Value: >=100,000 COLONIES/mL ESCHERICHIA COLI SUSCEPTIBILITIES TO FOLLOW Performed at [facility] [address] [place], [place] [address]    All other components within normal limits  POCT URINALYSIS DIP (MANUAL ENTRY) - Abnormal; Notable for the  following components:   Clarity, UA cloudy (*)    Spec Grav, UA <=1.005 (*)    Blood, UA trace-intact (*)    Nitrite, UA Positive (*)    Leukocytes, UA Large (3+) (*)    All other components within normal limits    Allergies  Allergen Reactions   Cephalosporins Rash   Latex Rash    Breaks out then gets infected   Lactose Intolerance (Gi) Nausea And Vomiting   Lactulose Nausea And Vomiting   Cephalexin Rash   Levofloxacin Anxiety and Rash   Meperidine And Related Nausea And Vomiting    Needed Phenergan to combat nausea.   Omeprazole Rash    Past Medical History:  Diagnosis Date   Ankle swelling    takes torsemide prn   Anxiety    Arthritis    Asthma    allergy induced   Back pain    Complication of anesthesia    Depression    GERD (gastroesophageal reflux disease)    Hypercholesterolemia    Hypothyroid    Parkinson disease (HCC)    PONV (postoperative nausea and vomiting)    Seasonal allergies    Social History   Socioeconomic History   Marital status: Married    Spouse name: Not on file   Number of children: 2   Years of education: Not on file   Highest education level: Not on file  Occupational History    Comment: retired  Tobacco Use   Smoking status: Never   Smokeless tobacco: Never  Substance and Sexual Activity   Alcohol  use: Yes    Alcohol/week: 1.0 standard drink    Types: 1 Glasses of wine per week    Comment: occ   Drug use: No   Sexual activity: Not on file  Other Topics Concern   Not on file  Social History Narrative   Patient lives at home alone and she is widowed.   Retired.    Education one year business course.   Right handed.   Caffeine two cup of coffee daily and one sweet tea.   Social Determinants of Health   Financial Resource Strain: Not on file  Food Insecurity: Not on file  Transportation Needs: Not on file  Physical Activity: Not on file  Stress: Not on file  Social Connections: Not on file  Intimate Partner  Violence: Not on file   Family History  Problem Relation Age of Onset   Stroke Mother    COPD Father    Stroke Father    Colon cancer Neg Hx         Vanessa Kick, MD 02/17/21 [phone]

## 2021-02-19 ENCOUNTER — Ambulatory Visit: Payer: Medicare Other | Admitting: Gastroenterology

## 2021-02-20 ENCOUNTER — Other Ambulatory Visit: Payer: Self-pay | Admitting: Adult Health

## 2021-04-10 ENCOUNTER — Ambulatory Visit
Admission: EM | Admit: 2021-04-10 | Discharge: 2021-04-10 | Disposition: A | Discharge status: Home / Self Care | Payer: Medicare Other | Attending: Family Medicine | Admitting: Family Medicine

## 2021-04-10 DIAGNOSIS — N3090 Cystitis, unspecified without hematuria: Secondary | ICD-10-CM | POA: Diagnosis not present

## 2021-04-10 DIAGNOSIS — R110 Nausea: Secondary | ICD-10-CM | POA: Diagnosis present

## 2021-04-10 LAB — POCT URINALYSIS DIP (MANUAL ENTRY)
Bilirubin, UA: NEGATIVE
Blood, UA: NEGATIVE
Glucose, UA: NEGATIVE mg/dL
Ketones, POC UA: NEGATIVE mg/dL
Nitrite, UA: POSITIVE — AB
Protein Ur, POC: NEGATIVE mg/dL
Spec Grav, UA: <=1.005 — AB (ref 1.010–1.025)
Urobilinogen, UA: 0.2 E.U./dL
pH, UA: 5 (ref 5.0–8.0)

## 2021-04-10 MED ORDER — ONDANSETRON 4 MG PO TBDP
4.0000 mg | ORAL_TABLET | Freq: Three times a day (TID) | ORAL | 0 refills | Status: DC | PRN
Start: 2021-04-10 — End: 2021-07-10

## 2021-04-10 MED ORDER — NITROFURANTOIN MONOHYD MACRO 100 MG PO CAPS
100.0000 mg | ORAL_CAPSULE | Freq: Two times a day (BID) | ORAL | 0 refills | Status: DC
Start: 2021-04-10 — End: 2021-07-10

## 2021-04-10 NOTE — ED Triage Notes (Signed)
Patient presents to Urgent Care with complaints of urinary urgency, lower abdominal pain. Self treated with AZO. Has a hx of UTI.   Denies fever, hematuria.

## 2021-04-11 LAB — URINE CULTURE: Culture: <10000 — AB

## 2021-04-11 NOTE — ED Provider Notes (Signed)
[facility]    ASSESSMENT & PLAN:  1. Cystitis   2. Nausea without vomiting    Declines Rx Bactrim.  Reports making her itch.  Begin:: Meds ordered this encounter  Medications   nitrofurantoin, macrocrystal-monohydrate, (MACROBID) 100 MG capsule    Sig: Take 1 capsule (100 mg total) by mouth 2 (two) times daily.    Dispense:  14 capsule    Refill:  0   ondansetron (ZOFRAN-ODT) 4 MG disintegrating tablet    Sig: Take 1 tablet (4 mg total) by mouth every 8 (eight) hours as needed for nausea or vomiting.    Dispense:  15 tablet    Refill:  0   No signs of pyelonephritis. Discussed. Urine culture sent.  Will follow up with her PCP or here if not showing improvement over the next 48 hours, sooner if needed.  Outlined signs and symptoms indicating need for more acute intervention. Patient verbalized understanding. After Visit Summary given.  SUBJECTIVE:  [name] is a 87 y.o. female who complains of urinary frequency, urgency and dysuria for the past few d. Without associated flank pain, fever, chills, vaginal discharge or bleeding. Gross hematuria: not present. No specific aggravating or alleviating factors reported. No LE edema. Normal PO intake without n/v/d. Without specific abdominal pain. Ambulatory without difficulty. OTC treatment: AZO with mild relief. H/O UTI: "occasionally".  LMP: No LMP recorded. Patient has had a hysterectomy.   OBJECTIVE:  Vitals:   04/10/21 1654  BP: (!) 149/90  Pulse: 72  Resp: 16  Temp: 97.8 F (36.6 C)  TempSrc: Temporal  SpO2: 94%   General appearance: alert; no distress HENT: oropharynx: moist Lungs: unlabored respirations Abdomen: soft, non-tender Back: no CVA tenderness Extremities: no edema; symmetrical with no gross deformities Skin: warm and dry Neurologic: normal gait Psychological: alert and cooperative; normal mood and affect  Labs Reviewed  POCT URINALYSIS DIP (MANUAL ENTRY) - Abnormal; Notable  for the following components:      Result Value   Color, UA straw (*)    Spec Grav, UA <=1.005 (*)    Nitrite, UA Positive (*)    Leukocytes, UA Trace (*)    All other components within normal limits  URINE CULTURE    Allergies  Allergen Reactions   Cephalosporins Rash   Latex Rash    Breaks out then gets infected   Lactose Intolerance (Gi) Nausea And Vomiting   Lactulose Nausea And Vomiting   Cephalexin Rash   Levofloxacin Anxiety and Rash   Meperidine And Related Nausea And Vomiting    Needed Phenergan to combat nausea.   Omeprazole Rash    Past Medical History:  Diagnosis Date   Ankle swelling    takes torsemide prn   Anxiety    Arthritis    Asthma    allergy induced   Back pain    Complication of anesthesia    Depression    GERD (gastroesophageal reflux disease)    Hypercholesterolemia    Hypothyroid    Parkinson disease (HCC)    PONV (postoperative nausea and vomiting)    Seasonal allergies    Social History   Socioeconomic History   Marital status: Married    Spouse name: Not on file   Number of children: 2   Years of education: Not on file   Highest education level: Not on file  Occupational History    Comment: retired  Tobacco Use   Smoking status: Never   Smokeless tobacco: Never  Substance  and Sexual Activity   Alcohol use: Yes    Alcohol/week: 1.0 standard drink    Types: 1 Glasses of wine per week    Comment: occ   Drug use: No   Sexual activity: Not on file  Other Topics Concern   Not on file  Social History Narrative   Patient lives at home alone and she is widowed.   Retired.    Education one year business course.   Right handed.   Caffeine two cup of coffee daily and one sweet tea.   Social Determinants of Health   Financial Resource Strain: Not on file  Food Insecurity: Not on file  Transportation Needs: Not on file  Physical Activity: Not on file  Stress: Not on file  Social Connections: Not on file  Intimate Partner  Violence: Not on file   Family History  Problem Relation Age of Onset   Stroke Mother    COPD Father    Stroke Father    Colon cancer Neg Hx         Vanessa Kick, MD 04/11/21 [phone]

## 2021-05-08 ENCOUNTER — Ambulatory Visit
Admission: EM | Admit: 2021-05-08 | Discharge: 2021-05-08 | Disposition: A | Discharge status: Home / Self Care | Payer: Medicare Other

## 2021-05-08 ENCOUNTER — Ambulatory Visit (INDEPENDENT_AMBULATORY_CARE_PROVIDER_SITE_OTHER): Payer: Medicare Other

## 2021-05-08 ENCOUNTER — Encounter: Payer: Self-pay | Admitting: Emergency Medicine

## 2021-05-08 ENCOUNTER — Other Ambulatory Visit: Payer: Self-pay

## 2021-05-08 DIAGNOSIS — R059 Cough, unspecified: Secondary | ICD-10-CM

## 2021-05-08 DIAGNOSIS — J208 Acute bronchitis due to other specified organisms: Secondary | ICD-10-CM | POA: Diagnosis not present

## 2021-05-08 MED ORDER — PREDNISONE 20 MG PO TABS: 40.0000 mg | ORAL_TABLET | Freq: Every day | ORAL | 0 refills | Status: AC

## 2021-05-08 MED ORDER — ALBUTEROL SULFATE HFA 108 (90 BASE) MCG/ACT IN AERS: 1.0000 | INHALATION_SPRAY | Freq: Four times a day (QID) | RESPIRATORY_TRACT | 0 refills | Status: DC | PRN

## 2021-05-08 NOTE — ED Provider Notes (Signed)
[facility]    CSN: [id number] Arrival date & time: 05/08/21  1353      History   Chief Complaint Chief Complaint  Patient presents with   Cough    HPI [name] is a 87 y.o. female presenting with viral syndrome/cough.  Medical history asthma, arthritis.  Also notes that she is currently being treated for a dental infection, has 2 days of amoxicillin left.  Describes cough for 1 month, worse over the last week productive of green sputum.  Denies shortness of breath, wheezing, chest pain, dizziness.  States she is worried as the cough is getting worse.  She does not have an albuterol inhaler at home.  Denies known sick contacts.   HPI  Past Medical History:  Diagnosis Date   Ankle swelling    takes torsemide prn   Anxiety    Arthritis    Asthma    allergy induced   Back pain    Complication of anesthesia    Depression    GERD (gastroesophageal reflux disease)    Hypercholesterolemia    Hypothyroid    Parkinson disease (HCC)    PONV (postoperative nausea and vomiting)    Seasonal allergies     Patient Active Problem List   Diagnosis Date Noted   GERD (gastroesophageal reflux disease)    History of Nissen fundoplication    IBS (irritable bowel syndrome) 02/05/2018   Diarrhea 06/16/2017   N&V (nausea and vomiting) 06/16/2017   Numbness 08/18/2015   Polyneuropathy 08/18/2015   Menopausal syndrome (hot flushes) 06/15/2015   Tremor, essential 06/15/2015   Gastroesophageal reflux disease with esophagitis 06/15/2015   Bilateral carpal tunnel syndrome 06/15/2015   Resting tremor 06/15/2015   Scoliosis of thoracic spine 11/05/2013   Spinal stenosis, unspecified region other than cervical 12/03/2012   Spondylosis of unspecified site without mention of myelopathy 12/03/2012   Parkinson disease ([place])    Back pain    Seasonal allergies     Past Surgical History:  Procedure Laterality Date   ABDOMINAL HYSTERECTOMY     CARPAL TUNNEL RELEASE Right  10/26/2015   Procedure: CARPAL TUNNEL RELEASE;  Surgeon: Daryll Brod, MD;  Location: [facility];  Service: Orthopedics;  Laterality: Right;   CARPOMETACARPEL SUSPENSION PLASTY Right 10/26/2015   Procedure: RIGHT HAND TRAPEZIECTOMY WITH THUMB METACARPEL SUSPENSION PLASTY;  Surgeon: Daryll Brod, MD;  Location: [facility];  Service: Orthopedics;  Laterality: Right;   CATARACT EXTRACTION W/PHACO  12/23/2011   Procedure: CATARACT EXTRACTION PHACO AND INTRAOCULAR LENS PLACEMENT (IOC);  Surgeon: Williams Che, MD;  Location: [facility];  Service: Ophthalmology;  Laterality: Right;  CDE=5.16   CHOLECYSTECTOMY     EYE SURGERY     cataract extraction of left eye-[facility]-2002   KNEE ARTHROSCOPY     right   NISSEN FUNDOPLICATION     [facility]   TRIGGER FINGER RELEASE Right 10/26/2015   Procedure: RELEASE A-1 PULLEY RIGHT RING FINGER;  Surgeon: Daryll Brod, MD;  Location: [facility];  Service: Orthopedics;  Laterality: Right;    OB History   No obstetric history on file.      Home Medications    Prior to Admission medications   Medication Sig Start Date End Date Taking? Authorizing Provider  albuterol (VENTOLIN HFA) 108 (90 Base) MCG/ACT inhaler Inhale 1-2 puffs into the lungs every 6 (six) hours as needed for wheezing or shortness of breath. 05/08/21  Yes Hazel Sams, PA-C  cetirizine (ZYRTEC) 10 MG  tablet Take 10 mg by mouth daily.   Yes [provider]  predniSONE (DELTASONE) 20 MG tablet Take 2 tablets (40 mg total) by mouth daily for 5 days. Take with breakfast or lunch. Avoid NSAIDs (ibuprofen, etc) while taking this medication. 05/08/21 05/13/21 Yes Hazel Sams, PA-C  Biotin 10 MG CAPS Take 1 capsule by mouth daily.     [provider]  bismuth subsalicylate (PEPTO BISMOL) 262 MG/15ML suspension Take 30 mLs by mouth as needed.    [provider]  carbidopa-levodopa (SINEMET IR) 25-100 MG tablet Take 1 tablet by mouth 4 (four)  times daily. Must be seen for further refills. Please call [phone] to schedule. 07/24/20   Ward Givens, NP  cetirizine-pseudoephedrine (ZYRTEC-D) 5-120 MG per tablet Take 1 tablet by mouth daily. For sinus     [provider]  Cholecalciferol (D3-1000 PO) Take by mouth daily.    [provider]  escitalopram (LEXAPRO) 5 MG tablet Take 10 mg by mouth daily.    [provider]  levothyroxine (SYNTHROID, LEVOTHROID) 25 MCG tablet Take 25 mcg by mouth daily. 03/09/14   [provider]  loperamide (IMODIUM) 2 MG capsule Take 2-4 mg by mouth daily.     [provider]  mometasone (NASONEX) 50 MCG/ACT nasal spray Place 2 sprays into the nose 2 (two) times daily as needed. For allergies    [provider]  montelukast (SINGULAIR) 10 MG tablet Take 10 mg by mouth at bedtime.      [provider]  Multiple Vitamins-Minerals (CAL-DAY 1000 PO) Take by mouth daily.    [provider]  mupirocin ointment (BACTROBAN) 2 % Apply 1 application topically 2 (two) times daily. 12/31/19   [place], [place], PA-C  naproxen (NAPROSYN) 500 MG tablet Take 500 mg by mouth 2 (two) times daily as needed.     [provider]  nitrofurantoin, macrocrystal-monohydrate, (MACROBID) 100 MG capsule Take 1 capsule (100 mg total) by mouth 2 (two) times daily. Patient not taking: Reported on 05/08/2021 04/10/21   Vanessa Kick, MD  ondansetron (ZOFRAN-ODT) 4 MG disintegrating tablet Take 1 tablet (4 mg total) by mouth every 8 (eight) hours as needed for nausea or vomiting. 04/10/21   Vanessa Kick, MD  penicillin v potassium (VEETID) 500 MG tablet Take 500 mg by mouth 3 (three) times daily. 04/26/21   [provider]  potassium chloride (MICRO-K) 10 MEQ CR capsule Take 20 mEq by mouth daily.  08/13/13   [provider]  QVAR REDIHALER 80 MCG/ACT AERB INHALATION TAKE 2 PUFFS DAILY TO PREVENT BREATHING PROBLEMS. 09/28/16   [provider]  RABEprazole (ACIPHEX) 20 MG tablet TAKE 1 TABLET (20 MG TOTAL) BY MOUTH DAILY BEFORE BREAKFAST. 10/10/20   Annitta Needs, NP  torsemide (DEMADEX) 10 MG tablet Take 10 mg by mouth daily. 1/2 to 1 tab daily    [provider]  zolpidem (AMBIEN) 5 MG tablet Take 5 mg by mouth at bedtime. 05/19/19   [provider]    Family History Family History  Problem Relation Age of Onset   Stroke Mother    COPD Father    Stroke Father    Colon cancer Neg Hx     Social History Social History   Tobacco Use   Smoking status: Never   Smokeless tobacco: Never  Vaping Use   Vaping Use: Never used  Substance Use Topics   Alcohol use: Yes    Alcohol/week: 1.0 standard drink  Types: 1 Glasses of wine per week    Comment: occ   Drug use: No     Allergies   Cephalosporins, Latex, Lactose intolerance (gi), Lactulose, Cephalexin, Levofloxacin, Meperidine and related, and Omeprazole   Review of Systems Review of Systems  Constitutional:  Negative for appetite change, chills and fever.  HENT:  Positive for congestion. Negative for ear pain, rhinorrhea, sinus pressure, sinus pain and sore throat.   Eyes:  Negative for redness and visual disturbance.  Respiratory:  Positive for cough. Negative for chest tightness, shortness of breath and wheezing.   Cardiovascular:  Negative for chest pain and palpitations.  Gastrointestinal:  Negative for abdominal pain, constipation, diarrhea, nausea and vomiting.  Genitourinary:  Negative for dysuria, frequency and urgency.  Musculoskeletal:  Negative for myalgias.  Neurological:  Negative for dizziness, weakness and headaches.  Psychiatric/Behavioral:  Negative for confusion.   All other systems reviewed and are negative.   Physical Exam Triage Vital Signs ED Triage Vitals  Enc Vitals Group     BP 05/08/21 1855 135/78     Pulse Rate 05/08/21 1855 75     Resp 05/08/21 1855 (!) 22     Temp 05/08/21 1855 98.1 F (36.7 C)     Temp  Source 05/08/21 1855 Oral     SpO2 05/08/21 1855 94 %     Weight --      Height --      Head Circumference --      Peak Flow --      Pain Score 05/08/21 1850 5     Pain Loc --      Pain Edu? --      Excl. in [place]? --    No data found.  Updated Vital Signs BP 135/78 (BP Location: Right Arm)   Pulse 75   Temp 98.1 F (36.7 C) (Oral)   Resp (!) 22   SpO2 94%   Visual Acuity Right Eye Distance:   Left Eye Distance:   Bilateral Distance:    Right Eye Near:   Left Eye Near:    Bilateral Near:     Physical Exam Vitals reviewed.  Constitutional:      General: She is not in acute distress.    Appearance: Normal appearance. She is not ill-appearing.  HENT:     Head: Normocephalic and atraumatic.     Right Ear: Tympanic membrane, ear canal and external ear normal. No tenderness. No middle ear effusion. There is no impacted cerumen. Tympanic membrane is not perforated, erythematous, retracted or bulging.     Left Ear: Tympanic membrane, ear canal and external ear normal. No tenderness.  No middle ear effusion. There is no impacted cerumen. Tympanic membrane is not perforated, erythematous, retracted or bulging.     Nose: Nose normal. No congestion.     Mouth/Throat:     Mouth: Mucous membranes are moist.     Pharynx: Uvula midline. No oropharyngeal exudate or posterior oropharyngeal erythema.  Eyes:     Extraocular Movements: Extraocular movements intact.     Pupils: Pupils are equal, round, and reactive to light.  Cardiovascular:     Rate and Rhythm: Normal rate and regular rhythm.     Heart sounds: Normal heart sounds.  Pulmonary:     Effort: Pulmonary effort is normal.     Breath sounds: Wheezing present. No decreased breath sounds, rhonchi or rales.     Comments: Faint expiratory wheezes lower lung fields Abdominal:     Palpations: Abdomen  is soft.     Tenderness: There is no abdominal tenderness. There is no guarding or rebound.  Lymphadenopathy:     Cervical: No  cervical adenopathy.     Right cervical: No superficial cervical adenopathy.    Left cervical: No superficial cervical adenopathy.  Neurological:     General: No focal deficit present.     Mental Status: She is alert and oriented to person, place, and time.  Psychiatric:        Mood and Affect: Mood normal.        Behavior: Behavior normal.        Thought Content: Thought content normal.        Judgment: Judgment normal.     UC Treatments / Results  Labs (all labs ordered are listed, but only abnormal results are displayed) Labs Reviewed - No data to display  EKG   Radiology DG Chest 2 View  Result Date: 05/08/2021 CLINICAL DATA:  Cough EXAM: CHEST - 2 VIEW COMPARISON:  08/14/2018 FINDINGS: Diffuse bilateral interstitial coarsening is unchanged. No focal airspace consolidation. No pleural effusion or pneumothorax. Normal cardiomediastinal contours. IMPRESSION: No acute airspace disease. Electronically Signed   By: Ulyses Jarred M.D.   On: 05/08/2021 19:32    Procedures Procedures (including critical care time)  Medications Ordered in UC Medications - No data to display  Initial Impression / Assessment and Plan / UC Course  I have reviewed the triage vital signs and the nursing notes.  Pertinent labs & imaging results that were available during my care of the patient were reviewed by me and considered in my medical decision making (see chart for details).     This patient is a very pleasant 87 y.o. year old female presenting with viral bronchitis. Today this pt is afebrile nontachycardic nontachypneic, oxygenating well on room air, decreased breath sounds and wheezes but no rhonchi or rales.   CXR- negative  Prednisone, albuterol. Complete amoxicilin as prescribed for dental infection  ED return precautions discussed. Patient verbalizes understanding and agreement.   Final Clinical Impressions(s) / UC Diagnoses   Final diagnoses:  Viral bronchitis     Discharge  Instructions      -You have bronchitis. Bronchitis is an inflammation of the lining of your bronchial tubes, which carry air to and from your lungs. This typically occurs after a virus, like a cold virus. People who have bronchitis often cough up thickened mucus, which can be discolored. This isn't a bacterial infection, so you don't need antibiotics. We treat it with medications to help reduce inflammation and open up your lungs.  -Your chest xray looks good today- there is no pneumonia -Prednisone, 2 pills taken at the same time for 5 days in a row.  Try taking this earlier in the day as it can give you energy.  -Albuterol inhaler as needed for cough, wheezing, shortness of breath, 1 to 2 puffs every 6 hours as needed. -Follow-up if symptoms getting worse instead of better      ED Prescriptions     Medication Sig Dispense Auth. Provider   predniSONE (DELTASONE) 20 MG tablet Take 2 tablets (40 mg total) by mouth daily for 5 days. Take with breakfast or lunch. Avoid NSAIDs (ibuprofen, etc) while taking this medication. 10 tablet Hazel Sams, PA-C   albuterol (VENTOLIN HFA) 108 (90 Base) MCG/ACT inhaler Inhale 1-2 puffs into the lungs every 6 (six) hours as needed for wheezing or shortness of breath. 1 each Hazel Sams, PA-C  PDMP not reviewed this encounter.   Hazel Sams, PA-C 05/08/21 1959

## 2021-05-08 NOTE — ED Triage Notes (Signed)
Patient has been taking penicillin for dental abscess.  Patient has 2 more days of antibiotic.   Patient has had a cough for a month, and cough has worsened.  Reports coughing up green phlegm.

## 2021-05-08 NOTE — Discharge Instructions (Addendum)
-  You have bronchitis. Bronchitis is an inflammation of the lining of your bronchial tubes, which carry air to and from your lungs. This typically occurs after a virus, like a cold virus. People who have bronchitis often cough up thickened mucus, which can be discolored. This isn't a bacterial infection, so you don't need antibiotics. We treat it with medications to help reduce inflammation and open up your lungs.  -Your chest xray looks good today- there is no pneumonia -Prednisone, 2 pills taken at the same time for 5 days in a row.  Try taking this earlier in the day as it can give you energy.  -Albuterol inhaler as needed for cough, wheezing, shortness of breath, 1 to 2 puffs every 6 hours as needed. -Follow-up if symptoms getting worse instead of better

## 2021-06-10 DIAGNOSIS — U071 COVID-19: Secondary | ICD-10-CM

## 2021-06-10 HISTORY — DX: COVID-19: U07.1

## 2021-06-11 ENCOUNTER — Ambulatory Visit (INDEPENDENT_AMBULATORY_CARE_PROVIDER_SITE_OTHER): Payer: Medicare Other

## 2021-06-11 ENCOUNTER — Other Ambulatory Visit: Payer: Self-pay

## 2021-06-11 ENCOUNTER — Ambulatory Visit
Admission: EM | Admit: 2021-06-11 | Discharge: 2021-06-11 | Disposition: A | Discharge status: Home / Self Care | Payer: Medicare Other | Attending: Urgent Care | Admitting: Urgent Care

## 2021-06-11 ENCOUNTER — Telehealth: Payer: Self-pay

## 2021-06-11 DIAGNOSIS — W109XXA Fall (on) (from) unspecified stairs and steps, initial encounter: Secondary | ICD-10-CM

## 2021-06-11 DIAGNOSIS — L03116 Cellulitis of left lower limb: Secondary | ICD-10-CM

## 2021-06-11 DIAGNOSIS — S81812A Laceration without foreign body, left lower leg, initial encounter: Secondary | ICD-10-CM | POA: Diagnosis not present

## 2021-06-11 DIAGNOSIS — W19XXXA Unspecified fall, initial encounter: Secondary | ICD-10-CM

## 2021-06-11 DIAGNOSIS — S81819A Laceration without foreign body, unspecified lower leg, initial encounter: Secondary | ICD-10-CM

## 2021-06-11 DIAGNOSIS — M25562 Pain in left knee: Secondary | ICD-10-CM | POA: Diagnosis not present

## 2021-06-11 DIAGNOSIS — B354 Tinea corporis: Secondary | ICD-10-CM

## 2021-06-11 MED ORDER — KETOCONAZOLE 2 % EX CREA: 1.0000 "application " | TOPICAL_CREAM | Freq: Every day | CUTANEOUS | 0 refills | Status: DC

## 2021-06-11 MED ORDER — ACETAMINOPHEN 325 MG PO TABS: 650.0000 mg | ORAL_TABLET | Freq: Four times a day (QID) | ORAL | 0 refills | Status: AC | PRN

## 2021-06-11 MED ORDER — MUPIROCIN 2 % EX OINT: 1.0000 "application " | TOPICAL_OINTMENT | Freq: Three times a day (TID) | CUTANEOUS | 0 refills | Status: DC

## 2021-06-11 MED ORDER — TETANUS-DIPHTH-ACELL PERTUSSIS 5-2.5-18.5 LF-MCG/0.5 IM SUSY: 0.5000 mL | PREFILLED_SYRINGE | Freq: Once | INTRAMUSCULAR | Status: DC

## 2021-06-11 MED ORDER — DOXYCYCLINE HYCLATE 100 MG PO CAPS: 100.0000 mg | ORAL_CAPSULE | Freq: Two times a day (BID) | ORAL | 0 refills | Status: DC

## 2021-06-11 NOTE — ED Provider Notes (Addendum)
[place]   MRN: [id number] DOB: 04-08-1934  Subjective:   [name] is a 88 y.o. female presenting for 4-day history of persistent left lower leg pain.  Patient was going up the stairs and accidentally hit her foot against one of the steps causing her to land directly onto her shin.  She has noticed some progressive and severe pain over the left upper knee.  She is also had a skin tear that she has been nursing, has kept it covered and clean.  Wanted this evaluated.  Cannot recall her last Tdap.  No fever, nausea, vomiting, drainage of pus or bleeding.  She is able to walk but very gingerly as she has significant pain over the knee.  No head injury, loss of consciousness.  No current facility-administered medications for this encounter.  Current Outpatient Medications:    albuterol (VENTOLIN HFA) 108 (90 Base) MCG/ACT inhaler, Inhale 1-2 puffs into the lungs every 6 (six) hours as needed for wheezing or shortness of breath., Disp: 1 each, Rfl: 0   Biotin 10 MG CAPS, Take 1 capsule by mouth daily. , Disp: , Rfl:    bismuth subsalicylate (PEPTO BISMOL) 262 MG/15ML suspension, Take 30 mLs by mouth as needed., Disp: , Rfl:    carbidopa-levodopa (SINEMET IR) 25-100 MG tablet, Take 1 tablet by mouth 4 (four) times daily. Must be seen for further refills. Please call [phone] to schedule., Disp: 360 tablet, Rfl: 0   cetirizine (ZYRTEC) 10 MG tablet, Take 10 mg by mouth daily., Disp: , Rfl:    cetirizine-pseudoephedrine (ZYRTEC-D) 5-120 MG per tablet, Take 1 tablet by mouth daily. For sinus , Disp: , Rfl:    Cholecalciferol (D3-1000 PO), Take by mouth daily., Disp: , Rfl:    escitalopram (LEXAPRO) 5 MG tablet, Take 10 mg by mouth daily., Disp: , Rfl:    levothyroxine (SYNTHROID, LEVOTHROID) 25 MCG tablet, Take 25 mcg by mouth daily., Disp: , Rfl:    loperamide (IMODIUM) 2 MG capsule, Take 2-4 mg by mouth daily. , Disp: , Rfl:    mometasone (NASONEX) 50 MCG/ACT nasal spray,  Place 2 sprays into the nose 2 (two) times daily as needed. For allergies, Disp: , Rfl:    montelukast (SINGULAIR) 10 MG tablet, Take 10 mg by mouth at bedtime.  , Disp: , Rfl:    Multiple Vitamins-Minerals (CAL-DAY 1000 PO), Take by mouth daily., Disp: , Rfl:    mupirocin ointment (BACTROBAN) 2 %, Apply 1 application topically 2 (two) times daily., Disp: 90 g, Rfl: 0   naproxen (NAPROSYN) 500 MG tablet, Take 500 mg by mouth 2 (two) times daily as needed. , Disp: , Rfl:    nitrofurantoin, macrocrystal-monohydrate, (MACROBID) 100 MG capsule, Take 1 capsule (100 mg total) by mouth 2 (two) times daily. (Patient not taking: Reported on 05/08/2021), Disp: 14 capsule, Rfl: 0   ondansetron (ZOFRAN-ODT) 4 MG disintegrating tablet, Take 1 tablet (4 mg total) by mouth every 8 (eight) hours as needed for nausea or vomiting., Disp: 15 tablet, Rfl: 0   penicillin v potassium (VEETID) 500 MG tablet, Take 500 mg by mouth 3 (three) times daily., Disp: , Rfl:    potassium chloride (MICRO-K) 10 MEQ CR capsule, Take 20 mEq by mouth daily. , Disp: , Rfl:    QVAR REDIHALER 80 MCG/ACT AERB, INHALATION TAKE 2 PUFFS DAILY TO PREVENT BREATHING PROBLEMS., Disp: , Rfl: 7   RABEprazole (ACIPHEX) 20 MG tablet, TAKE 1 TABLET (20 MG TOTAL) BY MOUTH DAILY BEFORE BREAKFAST.,  Disp: 90 tablet, Rfl: 1   torsemide (DEMADEX) 10 MG tablet, Take 10 mg by mouth daily. 1/2 to 1 tab daily, Disp: , Rfl:    zolpidem (AMBIEN) 5 MG tablet, Take 5 mg by mouth at bedtime., Disp: , Rfl:    Allergies  Allergen Reactions   Cephalosporins Rash   Latex Rash    Breaks out then gets infected   Lactose Intolerance (Gi) Nausea And Vomiting   Lactulose Nausea And Vomiting   Cephalexin Rash   Levofloxacin Anxiety and Rash   Meperidine And Related Nausea And Vomiting    Needed Phenergan to combat nausea.   Omeprazole Rash    Past Medical History:  Diagnosis Date   Ankle swelling    takes torsemide prn   Anxiety    Arthritis    Asthma     allergy induced   Back pain    Complication of anesthesia    Depression    GERD (gastroesophageal reflux disease)    Hypercholesterolemia    Hypothyroid    Parkinson disease ([place])    PONV (postoperative nausea and vomiting)    Seasonal allergies      Past Surgical History:  Procedure Laterality Date   ABDOMINAL HYSTERECTOMY     CARPAL TUNNEL RELEASE Right 10/26/2015   Procedure: CARPAL TUNNEL RELEASE;  Surgeon: Daryll Brod, MD;  Location: [facility];  Service: Orthopedics;  Laterality: Right;   CARPOMETACARPEL SUSPENSION PLASTY Right 10/26/2015   Procedure: RIGHT HAND TRAPEZIECTOMY WITH THUMB METACARPEL SUSPENSION PLASTY;  Surgeon: Daryll Brod, MD;  Location: [place];  Service: Orthopedics;  Laterality: Right;   CATARACT EXTRACTION W/PHACO  12/23/2011   Procedure: CATARACT EXTRACTION PHACO AND INTRAOCULAR LENS PLACEMENT (IOC);  Surgeon: Williams Che, MD;  Location: [facility];  Service: Ophthalmology;  Laterality: Right;  CDE=5.16   CHOLECYSTECTOMY     EYE SURGERY     cataract extraction of left eye-[facility]-2002   KNEE ARTHROSCOPY     right   NISSEN FUNDOPLICATION     [facility]   TRIGGER FINGER RELEASE Right 10/26/2015   Procedure: RELEASE A-1 PULLEY RIGHT RING FINGER;  Surgeon: Daryll Brod, MD;  Location: [place];  Service: Orthopedics;  Laterality: Right;    Family History  Problem Relation Age of Onset   Stroke Mother    COPD Father    Stroke Father    Colon cancer Neg Hx     Social History   Tobacco Use   Smoking status: Never   Smokeless tobacco: Never  Vaping Use   Vaping Use: Never used  Substance Use Topics   Alcohol use: Yes    Alcohol/week: 1.0 standard drink    Types: 1 Glasses of wine per week    Comment: occ   Drug use: No    ROS   Objective:   Vitals: BP (!) 152/71 (BP Location: Right Arm)    Pulse 82    Temp 98.4 F (36.9 C) (Oral)    Resp 18    SpO2 94%   Physical Exam Constitutional:      General:  She is not in acute distress.    Appearance: Normal appearance. She is well-developed. She is not ill-appearing, toxic-appearing or diaphoretic.  HENT:     Head: Normocephalic and atraumatic.     Nose: Nose normal.     Mouth/Throat:     Mouth: Mucous membranes are moist.     Pharynx: Oropharynx is clear.  Eyes:  General: No scleral icterus.    Extraocular Movements: Extraocular movements intact.     Pupils: Pupils are equal, round, and reactive to light.  Cardiovascular:     Rate and Rhythm: Normal rate.  Pulmonary:     Effort: Pulmonary effort is normal.  Musculoskeletal:       Legs:  Skin:    General: Skin is warm and dry.  Neurological:     General: No focal deficit present.     Mental Status: She is alert and oriented to person, place, and time.  Psychiatric:        Mood and Affect: Mood normal.        Behavior: Behavior normal.        Thought Content: Thought content normal.        Judgment: Judgment normal.    DG Knee Complete 4 Views Left  Result Date: 06/11/2021 CLINICAL DATA:  Patient fell down stairs. Left knee pain and swelling. EXAM: LEFT KNEE - COMPLETE 4+ VIEW COMPARISON:  None. FINDINGS: No fracture or bone lesion. Mild narrowing of the medial joint space compartment with minimal marginal osteophytes. Remaining joint spaces are well preserved. No joint effusion. Soft tissues are unremarkable. IMPRESSION: 1. No fracture or acute finding. 2. Mild medial joint space compartment osteoarthritis. Electronically Signed   By: Lajean Manes M.D.   On: 06/11/2021 14:06          Tdap updated in clinic today.  Assessment and Plan :   PDMP not reviewed this encounter.  1. Cellulitis of left lower extremity   2. Skin tear of lower leg without complication, initial encounter   3. Accidental fall, initial encounter   4. Tinea corporis    Suspect cellulitis as she has had this before and physical exam findings are consistent with this.  Start doxycycline.  She also  has an incidental finding of tinea corporis and therefore we will have her use ketoconazole for this.  Skin tear is not infected, discussed wound care.  A dressing was applied by CMA Mariela. Counseled patient on potential for adverse effects with medications prescribed/recommended today, ER and return-to-clinic precautions discussed, patient verbalized understanding.    Jaynee Eagles, PA-C 06/11/21 1434   Patient left prior to the Tdap being administered.  We called her and she would like to have this done on follow-up.   Jaynee Eagles, [place] 06/11/21 1610

## 2021-06-11 NOTE — ED Triage Notes (Signed)
Pt reports pain, swelling and redness in the left leg x 4 days. States she fell going down the stairs.

## 2021-06-11 NOTE — ED Notes (Signed)
Called pt and pt contacts no answered. I called as pt need Tdap and I discharged pt without the Tdap. Pt will return on Wednesday 06/13/2021.

## 2021-06-11 NOTE — Discharge Instructions (Signed)
Use ketoconazole cream to the left leg where you have ring worm. Use Bactroban antibiotic ointment to the skin tear. Clean the wound daily 2-3 times a day. Keep it covered with non-stick gauze and Coban. Take doxycycline for the cellulitis of the upper part of your left lower leg.

## 2021-06-11 NOTE — Telephone Encounter (Signed)
I contacted pt as I forgot  to give the Tdap. Pt will return on 06/13/2021.

## 2021-06-13 ENCOUNTER — Ambulatory Visit
Admission: EM | Admit: 2021-06-13 | Discharge: 2021-06-13 | Disposition: A | Discharge status: Home / Self Care | Payer: Medicare Other | Attending: Family Medicine | Admitting: Family Medicine

## 2021-06-13 ENCOUNTER — Other Ambulatory Visit: Payer: Self-pay

## 2021-06-13 DIAGNOSIS — S81812D Laceration without foreign body, left lower leg, subsequent encounter: Secondary | ICD-10-CM | POA: Diagnosis not present

## 2021-06-13 DIAGNOSIS — L03116 Cellulitis of left lower limb: Secondary | ICD-10-CM | POA: Diagnosis not present

## 2021-06-13 DIAGNOSIS — Z23 Encounter for immunization: Secondary | ICD-10-CM | POA: Diagnosis not present

## 2021-06-13 DIAGNOSIS — T148XXA Other injury of unspecified body region, initial encounter: Secondary | ICD-10-CM

## 2021-06-13 MED ORDER — TETANUS-DIPHTH-ACELL PERTUSSIS 5-2.5-18.5 LF-MCG/0.5 IM SUSY
0.5000 mL | PREFILLED_SYRINGE | Freq: Once | INTRAMUSCULAR | Status: AC
  Administered 2021-06-13: 0.5 mL via INTRAMUSCULAR

## 2021-06-13 NOTE — ED Provider Notes (Signed)
[facility]    CSN: [id number] Arrival date & time: 06/13/21  0956      History   Chief Complaint Chief Complaint  Patient presents with   Wound Check    Wound recheck from Monday    HPI [name] is a 88 y.o. female.   Patient presenting today following up on left lower leg cellulitis for which she was seen 2 days ago and placed on doxycycline.  She has been taking her medication consistently and states the area is somewhat better but the area of redness and swelling just below her left knee is still significantly painful, swollen, warm, red.  She has been keeping the skin tear that is lower on the leg covered and clean, trying to prop her leg up at rest.  Denies fever, chills, generalized body aches, sweats.   Past Medical History:  Diagnosis Date   Ankle swelling    takes torsemide prn   Anxiety    Arthritis    Asthma    allergy induced   Back pain    Complication of anesthesia    Depression    GERD (gastroesophageal reflux disease)    Hypercholesterolemia    Hypothyroid    Parkinson disease ([facility])    PONV (postoperative nausea and vomiting)    Seasonal allergies     Patient Active Problem List   Diagnosis Date Noted   GERD (gastroesophageal reflux disease)    History of Nissen fundoplication    IBS (irritable bowel syndrome) 02/05/2018   Diarrhea 06/16/2017   N&V (nausea and vomiting) 06/16/2017   Numbness 08/18/2015   Polyneuropathy 08/18/2015   Menopausal syndrome (hot flushes) 06/15/2015   Tremor, essential 06/15/2015   Gastroesophageal reflux disease with esophagitis 06/15/2015   Bilateral carpal tunnel syndrome 06/15/2015   Resting tremor 06/15/2015   Scoliosis of thoracic spine 11/05/2013   Spinal stenosis, unspecified region other than cervical 12/03/2012   Spondylosis of unspecified site without mention of myelopathy 12/03/2012   Parkinson disease ([place])    Back pain    Seasonal allergies     Past Surgical History:  Procedure  Laterality Date   ABDOMINAL HYSTERECTOMY     CARPAL TUNNEL RELEASE Right 10/26/2015   Procedure: CARPAL TUNNEL RELEASE;  Surgeon: Daryll Brod, MD;  Location: [place];  Service: Orthopedics;  Laterality: Right;   CARPOMETACARPEL SUSPENSION PLASTY Right 10/26/2015   Procedure: RIGHT HAND TRAPEZIECTOMY WITH THUMB METACARPEL SUSPENSION PLASTY;  Surgeon: Daryll Brod, MD;  Location: [place];  Service: Orthopedics;  Laterality: Right;   CATARACT EXTRACTION W/PHACO  12/23/2011   Procedure: CATARACT EXTRACTION PHACO AND INTRAOCULAR LENS PLACEMENT (IOC);  Surgeon: Williams Che, MD;  Location: [facility];  Service: Ophthalmology;  Laterality: Right;  CDE=5.16   CHOLECYSTECTOMY     EYE SURGERY     cataract extraction of left eye-[facility]-2002   KNEE ARTHROSCOPY     right   NISSEN FUNDOPLICATION     [facility]   TRIGGER FINGER RELEASE Right 10/26/2015   Procedure: RELEASE A-1 PULLEY RIGHT RING FINGER;  Surgeon: Daryll Brod, MD;  Location: [place];  Service: Orthopedics;  Laterality: Right;    OB History   No obstetric history on file.      Home Medications    Prior to Admission medications   Medication Sig Start Date End Date Taking? Authorizing Provider  acetaminophen (TYLENOL) 325 MG tablet Take 2 tablets (650 mg total) by mouth every 6 (six) hours as needed  for moderate pain. 06/11/21   Jaynee Eagles, PA-C  albuterol (VENTOLIN HFA) 108 (90 Base) MCG/ACT inhaler Inhale 1-2 puffs into the lungs every 6 (six) hours as needed for wheezing or shortness of breath. 05/08/21   Hazel Sams, PA-C  Biotin 10 MG CAPS Take 1 capsule by mouth daily.     [provider]  bismuth subsalicylate (PEPTO BISMOL) 262 MG/15ML suspension Take 30 mLs by mouth as needed.    [provider]  carbidopa-levodopa (SINEMET IR) 25-100 MG tablet Take 1 tablet by mouth 4 (four) times daily. Must be seen for further refills. Please call [phone] to schedule. 07/24/20    Ward Givens, NP  cetirizine (ZYRTEC) 10 MG tablet Take 10 mg by mouth daily.    [provider]  cetirizine-pseudoephedrine (ZYRTEC-D) 5-120 MG per tablet Take 1 tablet by mouth daily. For sinus     [provider]  Cholecalciferol (D3-1000 PO) Take by mouth daily.    [provider]  doxycycline (VIBRAMYCIN) 100 MG capsule Take 1 capsule (100 mg total) by mouth 2 (two) times daily. 06/11/21   Jaynee Eagles, PA-C  escitalopram (LEXAPRO) 5 MG tablet Take 10 mg by mouth daily.    [provider]  ketoconazole (NIZORAL) 2 % cream Apply 1 application topically daily. 06/11/21   Jaynee Eagles, PA-C  levothyroxine (SYNTHROID, LEVOTHROID) 25 MCG tablet Take 25 mcg by mouth daily. 03/09/14   [provider]  loperamide (IMODIUM) 2 MG capsule Take 2-4 mg by mouth daily.     [provider]  mometasone (NASONEX) 50 MCG/ACT nasal spray Place 2 sprays into the nose 2 (two) times daily as needed. For allergies    [provider]  montelukast (SINGULAIR) 10 MG tablet Take 10 mg by mouth at bedtime.      [provider]  Multiple Vitamins-Minerals (CAL-DAY 1000 PO) Take by mouth daily.    [provider]  mupirocin ointment (BACTROBAN) 2 % Apply 1 application topically 3 (three) times daily. 06/11/21   Jaynee Eagles, PA-C  naproxen (NAPROSYN) 500 MG tablet Take 500 mg by mouth 2 (two) times daily as needed.     [provider]  nitrofurantoin, macrocrystal-monohydrate, (MACROBID) 100 MG capsule Take 1 capsule (100 mg total) by mouth 2 (two) times daily. Patient not taking: Reported on 05/08/2021 04/10/21   Vanessa Kick, MD  ondansetron (ZOFRAN-ODT) 4 MG disintegrating tablet Take 1 tablet (4 mg total) by mouth every 8 (eight) hours as needed for nausea or vomiting. 04/10/21   Vanessa Kick, MD  penicillin v potassium (VEETID) 500 MG tablet Take 500 mg by mouth 3 (three) times daily. 04/26/21   [provider]  potassium  chloride (MICRO-K) 10 MEQ CR capsule Take 20 mEq by mouth daily.  08/13/13   [provider]  QVAR REDIHALER 80 MCG/ACT AERB INHALATION TAKE 2 PUFFS DAILY TO PREVENT BREATHING PROBLEMS. 09/28/16   [provider]  RABEprazole (ACIPHEX) 20 MG tablet TAKE 1 TABLET (20 MG TOTAL) BY MOUTH DAILY BEFORE BREAKFAST. 10/10/20   Annitta Needs, NP  torsemide (DEMADEX) 10 MG tablet Take 10 mg by mouth daily. 1/2 to 1 tab daily    [provider]  zolpidem (AMBIEN) 5 MG tablet Take 5 mg by mouth at bedtime. 05/19/19   [provider]    Family History Family History  Problem Relation Age of Onset   Stroke Mother    COPD Father    Stroke Father    Colon  cancer Neg Hx     Social History Social History   Tobacco Use   Smoking status: Never   Smokeless tobacco: Never  Vaping Use   Vaping Use: Never used  Substance Use Topics   Alcohol use: Yes    Alcohol/week: 1.0 standard drink    Types: 1 Glasses of wine per week    Comment: occ   Drug use: No     Allergies   Cephalosporins, Latex, Lactose intolerance (gi), Lactulose, Cephalexin, Levofloxacin, Meperidine and related, and Omeprazole   Review of Systems Review of Systems Per HPI  Physical Exam Triage Vital Signs ED Triage Vitals  Enc Vitals Group     BP 06/13/21 1220 (!) 149/81     Pulse Rate 06/13/21 1220 93     Resp 06/13/21 1220 14     Temp 06/13/21 1220 98.1 F (36.7 C)     Temp Source 06/13/21 1220 Oral     SpO2 06/13/21 1220 92 %     Weight --      Height --      Head Circumference --      Peak Flow --      Pain Score 06/13/21 1221 7     Pain Loc --      Pain Edu? --      Excl. in [place]? --    No data found.  Updated Vital Signs BP (!) 149/81 (BP Location: Right Arm)    Pulse 93    Temp 98.1 F (36.7 C) (Oral)    Resp 14    SpO2 92%   Visual Acuity Right Eye Distance:   Left Eye Distance:   Bilateral Distance:    Right Eye Near:   Left Eye Near:    Bilateral Near:      Physical Exam Vitals and nursing note reviewed.  Constitutional:      Appearance: Normal appearance. She is not ill-appearing.  HENT:     Head: Atraumatic.  Eyes:     Extraocular Movements: Extraocular movements intact.     Conjunctiva/sclera: Conjunctivae normal.  Cardiovascular:     Rate and Rhythm: Normal rate and regular rhythm.     Heart sounds: Normal heart sounds.     Comments: Moderate varicosities bilateral lower extremities Pulmonary:     Effort: Pulmonary effort is normal.     Breath sounds: Normal breath sounds.  Musculoskeletal:        General: Normal range of motion.     Cervical back: Normal range of motion and neck supple.  Skin:    General: Skin is warm.     Findings: Erythema present.     Comments: 1 cm skin tear left anterior lower leg, mild diffuse surrounding erythema, tenderness to palpation with second area of contusion, erythema, edema just below the left knee.  This area is significantly tender to palpation without fluctuance, drainage, skin abrasion  Neurological:     Mental Status: She is alert and oriented to person, place, and time.     Comments: Left lower extremity neurovascularly intact  Psychiatric:        Mood and Affect: Mood normal.        Thought Content: Thought content normal.        Judgment: Judgment normal.     UC Treatments / Results  Labs (all labs ordered are listed, but only abnormal results are displayed) Labs Reviewed - No data to display  EKG   Radiology No results found.  Procedures Procedures (including  critical care time)  Medications Ordered in UC Medications  Tdap (BOOSTRIX) injection 0.5 mL (0.5 mLs Intramuscular Given 06/13/21 1224)    Initial Impression / Assessment and Plan / UC Course  I have reviewed the triage vital signs and the nursing notes.  Pertinent labs & imaging results that were available during my care of the patient were reviewed by me and considered in my medical decision making (see  chart for details).     Per patient, slight improvement since starting doxycycline.  We will give the medication several more days she reports improvement but if worsening at any point or not continuing to improve follow-up for reevaluation or go to the emergency department.  Elevation, compression as tolerated.  Final Clinical Impressions(s) / UC Diagnoses   Final diagnoses:  Cellulitis of left lower extremity  Abrasion     Discharge Instructions      Continue wound care as reviewed in previous visit, follow-up with your primary care provider by end of week to recheck the area and if it is significantly worsening at any time go to the emergency department for further evaluation and management.    ED Prescriptions   None    PDMP not reviewed this encounter.   Volney American, [place] 06/13/21 1943

## 2021-06-13 NOTE — ED Triage Notes (Signed)
Patient states she needs her wound on her left leg rechecked.   Patient states she is having some swelling in that left leg with cellulitis.  She states she has a bad headache

## 2021-06-13 NOTE — Discharge Instructions (Signed)
Continue wound care as reviewed in previous visit, follow-up with your primary care provider by end of week to recheck the area and if it is significantly worsening at any time go to the emergency department for further evaluation and management.

## 2021-07-10 ENCOUNTER — Other Ambulatory Visit: Payer: Self-pay

## 2021-07-10 ENCOUNTER — Ambulatory Visit (HOSPITAL_COMMUNITY)
Admission: RE | Admit: 2021-07-10 | Discharge: 2021-07-10 | Disposition: A | Discharge status: Home / Self Care | Payer: Medicare Other | Source: Ambulatory Visit | Attending: Gastroenterology | Admitting: Gastroenterology

## 2021-07-10 ENCOUNTER — Encounter: Payer: Self-pay | Admitting: Gastroenterology

## 2021-07-10 ENCOUNTER — Ambulatory Visit: Payer: Medicare Other | Admitting: Gastroenterology

## 2021-07-10 VITALS — BP 142/81 | HR 84 | Temp 97.2°F | Ht 62.0 in | Wt 168.8 lb

## 2021-07-10 DIAGNOSIS — A09 Infectious gastroenteritis and colitis, unspecified: Secondary | ICD-10-CM | POA: Diagnosis not present

## 2021-07-10 DIAGNOSIS — K580 Irritable bowel syndrome with diarrhea: Secondary | ICD-10-CM

## 2021-07-10 DIAGNOSIS — R600 Localized edema: Secondary | ICD-10-CM | POA: Insufficient documentation

## 2021-07-10 DIAGNOSIS — K219 Gastro-esophageal reflux disease without esophagitis: Secondary | ICD-10-CM

## 2021-07-10 MED ORDER — FAMOTIDINE 20 MG PO TABS: ORAL_TABLET | ORAL | 5 refills | Status: DC

## 2021-07-10 NOTE — Progress Notes (Signed)
Primary Care Physician: Lemmie Evens, MD  Primary Gastroenterologist:  Garfield Cornea, MD   Chief Complaint  Patient presents with   Irritable Bowel Syndrome    Still bouts of diarrhea and nausea that comes/goes    HPI: [name] is a 88 y.o. female here for follow-up.  Office visit requested for refills.  Patient last seen in March 2021.  She has a history of IBS, GERD, previous Nissen fundoplication at [facility] in 2831.  Last colonoscopy 2001 with no future screenings recommended due to age.  Upper GI series 2020 showing diffuse age-related esophageal dysmotility with poor clearance for primary peristaltic waves, typical extrinsic compression of the GE junction and distal esophagus from an intact fundoplication wrap not identified during exam but no hiatal hernia or GERD noted.  Previously did not tolerate pantoprazole, she felt like it caused increased difficulty urinating.  Was switched to Aciphex at last visit. States she didn't take Aciphex very long. States it "ruined me". Caused urgency/fecal incontinence. Stool was so much softer and couldn't control it. She stopped medication and symptoms went away.   Chronic intermittent episodes, patient states that she has any lower abdominal pain, she knows she better hurry to the bathroom.  Knows the stool is coming soon and sometimes urgent.  May start off hard but quickly changes.  Sometimes associated with nausea and vomiting, had an episode yesterday.  After these episodes she feels drained and wiped out for a few hours.  Often will not eat or drink until later in the day when this occurs.  She does have lactose intolerance, tends to avoid dairy products.  Uses Lactaid milk in her cereal in the morning.    States she has been on several rounds of antibiotics for bronchitis and cellulitis, just completing a course about a week ago.  Has been using Imodium plus or minus Pepto-Bismol when she has episodes of diarrhea.  This occurs about  every 2 or 3 days.  Often has normal bowel movements in between.  With initial loose stool she will take 2 Imodium, may add Pepto-Bismol if ongoing diarrhea.  Stools will firm up after Imodium and sometimes become large after that.  On her best day she may have 1 stool.  On her bad days she has up to 5 stools per day.  Denies any nocturnal symptoms.  She did use Culturelle probiotics while on antibiotics.  Denies any melena or bright red blood per rectum.  She is concerned because of upcoming dental procedure, will have to take Augmentin.  She only has occasional heartburn, watching what she eats.   Concerned about left lower extremity swelling.  She had an injury, fell on a concrete/brick step, scraping her anterior lower extremity.  Skin lesions improving.  Was treated for cellulitis.  Notes persistent swelling and discomfort.     Current Outpatient Medications  Medication Sig Dispense Refill   acetaminophen (TYLENOL) 325 MG tablet Take 2 tablets (650 mg total) by mouth every 6 (six) hours as needed for moderate pain. 30 tablet 0   albuterol (VENTOLIN HFA) 108 (90 Base) MCG/ACT inhaler Inhale 1-2 puffs into the lungs every 6 (six) hours as needed for wheezing or shortness of breath. 1 each 0   Biotin 10 MG CAPS Take 1 capsule by mouth daily.      bismuth subsalicylate (PEPTO BISMOL) 262 MG/15ML suspension Take 30 mLs by mouth as needed.     carbidopa-levodopa (SINEMET IR) 25-100 MG tablet Take 1  tablet by mouth 4 (four) times daily. Must be seen for further refills. Please call [phone] to schedule. 360 tablet 0   cetirizine (ZYRTEC) 10 MG tablet Take 10 mg by mouth daily.     cetirizine-pseudoephedrine (ZYRTEC-D) 5-120 MG per tablet Take 1 tablet by mouth daily. For sinus as needed     Cholecalciferol (D3-1000 PO) Take by mouth daily.     diphenhydrAMINE (BENADRYL) 25 MG tablet Take 25 mg by mouth every 6 (six) hours as needed.     escitalopram (LEXAPRO) 5 MG tablet Take 10 mg by mouth daily.      ketoconazole (NIZORAL) 2 % cream Apply 1 application topically daily. 30 g 0   Lactase (LACTAID PO) Take by mouth. As needed     levothyroxine (SYNTHROID, LEVOTHROID) 25 MCG tablet Take 25 mcg by mouth daily.     loperamide (IMODIUM) 2 MG capsule Take 2-4 mg by mouth daily.     mometasone (NASONEX) 50 MCG/ACT nasal spray Place 2 sprays into the nose 2 (two) times daily as needed. For allergies     montelukast (SINGULAIR) 10 MG tablet Take 10 mg by mouth at bedtime.       Multiple Vitamins-Minerals (ZINC PO) Take by mouth daily.     mupirocin ointment (BACTROBAN) 2 % Apply 1 application topically 3 (three) times daily. (Patient taking differently: Apply 1 application topically 3 (three) times daily. As needed) 30 g 0   naproxen (NAPROSYN) 500 MG tablet Take 500 mg by mouth 2 (two) times daily as needed.      potassium chloride (MICRO-K) 10 MEQ CR capsule Take 20 mEq by mouth daily.      QVAR REDIHALER 80 MCG/ACT AERB INHALATION TAKE 2 PUFFS DAILY TO PREVENT BREATHING PROBLEMS.  7   Simethicone (GAS-X PO) Take by mouth. As needed     torsemide (DEMADEX) 10 MG tablet Take 10 mg by mouth daily. 1/2 to 1 tab daily     No current facility-administered medications for this visit.    Allergies as of 07/10/2021 - Review Complete 07/10/2021  Allergen Reaction Noted   Cephalosporins Rash 10/19/2010   Latex Rash 01/17/2011   Lactose intolerance (gi) Nausea And Vomiting 12/09/2011   Lactulose Nausea And Vomiting 06/15/2015   Cephalexin Rash 06/15/2015   Levofloxacin Anxiety and Rash 10/08/2018   Meperidine and related Nausea And Vomiting 10/19/2010   Omeprazole Rash 06/15/2015    ROS:  General: Negative for anorexia, weight loss, fever, chills, fatigue, weakness. ENT: Negative for hoarseness, difficulty swallowing , nasal congestion. CV: Negative for chest pain, angina, palpitations, dyspnea on exertion, peripheral edema.  Respiratory: Negative for dyspnea at rest, dyspnea on exertion,  cough, sputum, wheezing.  GI: See history of present illness. GU:  Negative for dysuria, hematuria, urinary incontinence, urinary frequency, nocturnal urination.  Endo: Negative for unusual weight change.    Physical Examination:   BP (!) 142/81    Pulse 84    Temp (!) 97.2 F (36.2 C)    Ht 5' 2"  (1.575 m)    Wt 168 lb 12.8 oz (76.6 kg)    BMI 30.87 kg/m   General: Well-nourished, well-developed in no acute distress.  Eyes: No icterus. Mouth:masked  Abdomen: Bowel sounds are normal, nontender, nondistended, no hepatosplenomegaly or masses, no abdominal bruits or hernia , no rebound or guarding.   Extremities: Left lower extremity 2+ pitting edema, RLE no edema. No cellulitis. No clubbing or deformities. Neuro: Alert and oriented x 4   Skin: Warm and  dry, no jaundice.   Psych: Alert and cooperative, normal mood and affect.   Imaging Studies: DG Knee Complete 4 Views Left  Result Date: 06/11/2021 CLINICAL DATA:  Patient fell down stairs. Left knee pain and swelling. EXAM: LEFT KNEE - COMPLETE 4+ VIEW COMPARISON:  None. FINDINGS: No fracture or bone lesion. Mild narrowing of the medial joint space compartment with minimal marginal osteophytes. Remaining joint spaces are well preserved. No joint effusion. Soft tissues are unremarkable. IMPRESSION: 1. No fracture or acute finding. 2. Mild medial joint space compartment osteoarthritis. Electronically Signed   By: Lajean Manes M.D.   On: 06/11/2021 14:06     Assessment:  Chronic GERD: Doing well on Aciphex 20 mg daily.  Reinforced antireflux measures.  Acute on chronic intermittent diarrhea: baseline IBS. Historically has had issues off and on in the past, he typically controls with low-dose Imodium.  She does have intermittent episodes of lower abdominal pain associated with fecal urgency, vomiting.  Sounds like she is getting some constipation, likely brought on by Imodium and Pepto-Bismol use.  She has been on multiple rounds of  antibiotics recently.  She notes diarrhea has become more frequent although still has been manageable.  She has additional antibiotics planned due to dental work.  We discussed at length, less likely she has C. difficile given intermittent constipation, normal stools.  She is at increased risk however.  If she has persistent diarrhea, or increased needs for antidiarrheals, would consider stool studies.  Lower extremity edema: Suspect edema related to right leg injury however, nearly 1 month out.  Need to rule out DVT.  Plan: Take famotidine 20 mg daily before evening meal for acid reflux.  Can take up to twice daily.   When she has diarrhea, suggest after the second loose stool she can take 1 Imodium instead of 2.  May take second Imodium if she has further loose stools.  Try to find the smallest effective dose to avoid constipation. Encouraged her to utilize probiotics now given upcoming need for additional antibiotics.  She will continue at least 2 to 3 weeks after completing antibiotics.  Collect stool for C. difficile and GI pathogen panel if she has ongoing diarrhea.  We will send her for Doppler ultrasound to rule out left lower extremity DVT.

## 2021-07-10 NOTE — Patient Instructions (Signed)
Take famotidine 40m daily before evening meal for acid reflux. You can take up to twice daily. For diarrhea, take one imodium if you have more than 2 loose stools in a day. May take second imodium if you have further loose stools. Try to find smallest effective dose so that you can avoid constipation. Start back on probiotic given upcoming need for additional antibiotics. Continue probiotic at least 2-3 weeks after you finish the antibiotics. Collect stool specimen and return to [facility]. Doppler test today to rule out blood clot in your left leg.

## 2021-07-12 ENCOUNTER — Encounter: Payer: Self-pay | Admitting: Gastroenterology

## 2021-07-28 ENCOUNTER — Other Ambulatory Visit: Payer: Self-pay

## 2021-07-28 ENCOUNTER — Ambulatory Visit
Admission: EM | Admit: 2021-07-28 | Discharge: 2021-07-28 | Disposition: A | Discharge status: Home / Self Care | Payer: Medicare Other | Attending: Urgent Care | Admitting: Urgent Care

## 2021-07-28 DIAGNOSIS — J3089 Other allergic rhinitis: Secondary | ICD-10-CM

## 2021-07-28 DIAGNOSIS — H938X2 Other specified disorders of left ear: Secondary | ICD-10-CM | POA: Diagnosis not present

## 2021-07-28 DIAGNOSIS — R052 Subacute cough: Secondary | ICD-10-CM

## 2021-07-28 DIAGNOSIS — J3489 Other specified disorders of nose and nasal sinuses: Secondary | ICD-10-CM

## 2021-07-28 DIAGNOSIS — B349 Viral infection, unspecified: Secondary | ICD-10-CM

## 2021-07-28 DIAGNOSIS — J4530 Mild persistent asthma, uncomplicated: Secondary | ICD-10-CM

## 2021-07-28 MED ORDER — PROMETHAZINE-DM 6.25-15 MG/5ML PO SYRP: 5.0000 mL | ORAL_SOLUTION | Freq: Every evening | ORAL | 0 refills | Status: DC | PRN

## 2021-07-28 MED ORDER — PREDNISONE 20 MG PO TABS: ORAL_TABLET | ORAL | 0 refills | Status: DC

## 2021-07-28 MED ORDER — BENZONATATE 100 MG PO CAPS: 100.0000 mg | ORAL_CAPSULE | Freq: Three times a day (TID) | ORAL | 0 refills | Status: DC | PRN

## 2021-07-28 MED ORDER — ALBUTEROL SULFATE HFA 108 (90 BASE) MCG/ACT IN AERS: 1.0000 | INHALATION_SPRAY | Freq: Four times a day (QID) | RESPIRATORY_TRACT | 0 refills | Status: DC | PRN

## 2021-07-28 NOTE — ED Provider Notes (Signed)
[place]   MRN: [id number] DOB: [birth year]  Subjective:   [name] is a 88 y.o. female with pmh of allergic rhinitis and asthma presenting for 3 day history of acute onset left ear fullness, sinus congestion, post-nasal drainage, coughing, chest rattling.  Patient is not a smoker. Does take Zyrtec daily. Needs a refill on her albuterol inhaler. She is not a smoker. No chest pain, shob, wheezing.   No current facility-administered medications for this encounter.  Current Outpatient Medications:    acetaminophen (TYLENOL) 325 MG tablet, Take 2 tablets (650 mg total) by mouth every 6 (six) hours as needed for moderate pain., Disp: 30 tablet, Rfl: 0   albuterol (VENTOLIN HFA) 108 (90 Base) MCG/ACT inhaler, Inhale 1-2 puffs into the lungs every 6 (six) hours as needed for wheezing or shortness of breath., Disp: 1 each, Rfl: 0   Biotin 10 MG CAPS, Take 1 capsule by mouth daily. , Disp: , Rfl:    bismuth subsalicylate (PEPTO BISMOL) 262 MG/15ML suspension, Take 30 mLs by mouth as needed., Disp: , Rfl:    carbidopa-levodopa (SINEMET IR) 25-100 MG tablet, Take 1 tablet by mouth 4 (four) times daily. Must be seen for further refills. Please call [phone] to schedule., Disp: 360 tablet, Rfl: 0   cetirizine (ZYRTEC) 10 MG tablet, Take 10 mg by mouth daily., Disp: , Rfl:    cetirizine-pseudoephedrine (ZYRTEC-D) 5-120 MG per tablet, Take 1 tablet by mouth daily. For sinus as needed, Disp: , Rfl:    Cholecalciferol (D3-1000 PO), Take by mouth daily., Disp: , Rfl:    diphenhydrAMINE (BENADRYL) 25 MG tablet, Take 25 mg by mouth every 6 (six) hours as needed., Disp: , Rfl:    escitalopram (LEXAPRO) 5 MG tablet, Take 10 mg by mouth daily., Disp: , Rfl:    famotidine (PEPCID) 20 MG tablet, Take one dose before evening meal each day, may take second dose if needed. No more than two per day., Disp: 60 tablet, Rfl: 5   ketoconazole (NIZORAL) 2 % cream, Apply 1 application topically  daily., Disp: 30 g, Rfl: 0   Lactase (LACTAID PO), Take by mouth. As needed, Disp: , Rfl:    levothyroxine (SYNTHROID, LEVOTHROID) 25 MCG tablet, Take 25 mcg by mouth daily., Disp: , Rfl:    loperamide (IMODIUM) 2 MG capsule, Take 2-4 mg by mouth daily., Disp: , Rfl:    mometasone (NASONEX) 50 MCG/ACT nasal spray, Place 2 sprays into the nose 2 (two) times daily as needed. For allergies, Disp: , Rfl:    montelukast (SINGULAIR) 10 MG tablet, Take 10 mg by mouth at bedtime.  , Disp: , Rfl:    Multiple Vitamins-Minerals (ZINC PO), Take by mouth daily., Disp: , Rfl:    mupirocin ointment (BACTROBAN) 2 %, Apply 1 application topically 3 (three) times daily. (Patient taking differently: Apply 1 application topically 3 (three) times daily. As needed), Disp: 30 g, Rfl: 0   naproxen (NAPROSYN) 500 MG tablet, Take 500 mg by mouth 2 (two) times daily as needed. , Disp: , Rfl:    potassium chloride (MICRO-K) 10 MEQ CR capsule, Take 20 mEq by mouth daily. , Disp: , Rfl:    QVAR REDIHALER 80 MCG/ACT AERB, INHALATION TAKE 2 PUFFS DAILY TO PREVENT BREATHING PROBLEMS., Disp: , Rfl: 7   Simethicone (GAS-X PO), Take by mouth. As needed, Disp: , Rfl:    torsemide (DEMADEX) 10 MG tablet, Take 10 mg by mouth daily. 1/2 to 1 tab daily, Disp: , Rfl:  Allergies  Allergen Reactions   Cephalosporins Rash   Latex Rash    Breaks out then gets infected   Lactose Intolerance (Gi) Nausea And Vomiting   Lactulose Nausea And Vomiting   Cephalexin Rash   Levofloxacin Anxiety and Rash   Meperidine And Related Nausea And Vomiting    Needed Phenergan to combat nausea.   Omeprazole Rash    Past Medical History:  Diagnosis Date   Ankle swelling    takes torsemide prn   Anxiety    Arthritis    Asthma    allergy induced   Back pain    Complication of anesthesia    Depression    GERD (gastroesophageal reflux disease)    Hypercholesterolemia    Hypothyroid    Parkinson disease ([place])    PONV (postoperative nausea  and vomiting)    Seasonal allergies      Past Surgical History:  Procedure Laterality Date   ABDOMINAL HYSTERECTOMY     CARPAL TUNNEL RELEASE Right 10/26/2015   Procedure: CARPAL TUNNEL RELEASE;  Surgeon: Daryll Brod, MD;  Location: [place];  Service: Orthopedics;  Laterality: Right;   CARPOMETACARPEL SUSPENSION PLASTY Right 10/26/2015   Procedure: RIGHT HAND TRAPEZIECTOMY WITH THUMB METACARPEL SUSPENSION PLASTY;  Surgeon: Daryll Brod, MD;  Location: [place];  Service: Orthopedics;  Laterality: Right;   CATARACT EXTRACTION W/PHACO  12/23/2011   Procedure: CATARACT EXTRACTION PHACO AND INTRAOCULAR LENS PLACEMENT (IOC);  Surgeon: Williams Che, MD;  Location: [facility];  Service: Ophthalmology;  Laterality: Right;  CDE=5.16   CHOLECYSTECTOMY     EYE SURGERY     cataract extraction of left eye-[facility]-2002   KNEE ARTHROSCOPY     right   NISSEN FUNDOPLICATION     [facility]   TRIGGER FINGER RELEASE Right 10/26/2015   Procedure: RELEASE A-1 PULLEY RIGHT RING FINGER;  Surgeon: Daryll Brod, MD;  Location: [place];  Service: Orthopedics;  Laterality: Right;    Family History  Problem Relation Age of Onset   Stroke Mother    COPD Father    Stroke Father    Colon cancer Neg Hx     Social History   Tobacco Use   Smoking status: Never   Smokeless tobacco: Never  Vaping Use   Vaping Use: Never used  Substance Use Topics   Alcohol use: Yes    Alcohol/week: 1.0 standard drink    Types: 1 Glasses of wine per week    Comment: occ   Drug use: No    ROS   Objective:   Vitals: BP (!) 145/70 (BP Location: Right Arm)    Pulse 89    Temp 97.8 F (36.6 C) (Oral)    Resp 20    SpO2 94%   Physical Exam Constitutional:      General: She is not in acute distress.    Appearance: Normal appearance. She is well-developed and normal weight. She is not ill-appearing, toxic-appearing or diaphoretic.  HENT:     Head: Normocephalic and atraumatic.      Right Ear: Tympanic membrane, ear canal and external ear normal. No drainage or tenderness. No middle ear effusion. There is no impacted cerumen. Tympanic membrane is not erythematous.     Left Ear: Tympanic membrane, ear canal and external ear normal. No drainage or tenderness.  No middle ear effusion. There is no impacted cerumen. Tympanic membrane is not erythematous.     Nose: Congestion and rhinorrhea present.     Mouth/Throat:  Mouth: Mucous membranes are moist. No oral lesions.     Pharynx: No pharyngeal swelling, oropharyngeal exudate, posterior oropharyngeal erythema or uvula swelling.     Tonsils: No tonsillar exudate or tonsillar abscesses.  Eyes:     General: No scleral icterus.       Right eye: No discharge.        Left eye: No discharge.     Extraocular Movements: Extraocular movements intact.     Right eye: Normal extraocular motion.     Left eye: Normal extraocular motion.     Conjunctiva/sclera: Conjunctivae normal.  Cardiovascular:     Rate and Rhythm: Normal rate.     Heart sounds: No murmur heard.   No friction rub. No gallop.  Pulmonary:     Effort: Pulmonary effort is normal. No respiratory distress.     Breath sounds: No stridor. No wheezing, rhonchi or rales.  Chest:     Chest wall: No tenderness.  Musculoskeletal:     Cervical back: Normal range of motion and neck supple.  Lymphadenopathy:     Cervical: No cervical adenopathy.  Skin:    General: Skin is warm and dry.  Neurological:     General: No focal deficit present.     Mental Status: She is alert and oriented to person, place, and time.  Psychiatric:        Mood and Affect: Mood normal.        Behavior: Behavior normal.    Assessment and Plan :   PDMP not reviewed this encounter.  1. Acute viral syndrome   2. Stuffy and runny nose   3. Ear fullness, left   4. Subacute cough   5. Allergic rhinitis due to other allergic trigger, unspecified seasonality   6. Mild persistent asthma without  complication    Patient declined COVID and flu testing. Deferred imaging given clear cardiopulmonary exam, hemodynamically stable vital signs. In the context of her asthma, allergic rhinitis and respiratory and sinus symptoms, will be using an oral prednisone course. Otherwise, patient has an acute viral syndrome and recommended supportive care. Counseled patient on potential for adverse effects with medications prescribed/recommended today, ER and return-to-clinic precautions discussed, patient verbalized understanding.    Jaynee Eagles, [place] 07/28/21 [id number]

## 2021-07-28 NOTE — ED Triage Notes (Signed)
Pt reports cough, runny nose, back of throat feels scratchy, left ear is clogged x 3  days.

## 2021-08-06 ENCOUNTER — Other Ambulatory Visit: Payer: Self-pay

## 2021-08-06 ENCOUNTER — Ambulatory Visit (INDEPENDENT_AMBULATORY_CARE_PROVIDER_SITE_OTHER): Payer: Medicare Other

## 2021-08-06 ENCOUNTER — Ambulatory Visit
Admission: EM | Admit: 2021-08-06 | Discharge: 2021-08-06 | Disposition: A | Discharge status: Home / Self Care | Payer: Medicare Other | Attending: Urgent Care | Admitting: Urgent Care

## 2021-08-06 DIAGNOSIS — J4540 Moderate persistent asthma, uncomplicated: Secondary | ICD-10-CM | POA: Diagnosis not present

## 2021-08-06 DIAGNOSIS — R0989 Other specified symptoms and signs involving the circulatory and respiratory systems: Secondary | ICD-10-CM

## 2021-08-06 DIAGNOSIS — U071 COVID-19: Secondary | ICD-10-CM | POA: Diagnosis not present

## 2021-08-06 DIAGNOSIS — R9389 Abnormal findings on diagnostic imaging of other specified body structures: Secondary | ICD-10-CM

## 2021-08-06 DIAGNOSIS — R059 Cough, unspecified: Secondary | ICD-10-CM

## 2021-08-06 DIAGNOSIS — J9811 Atelectasis: Secondary | ICD-10-CM

## 2021-08-06 MED ORDER — PROMETHAZINE-DM 6.25-15 MG/5ML PO SYRP
5.0000 mL | ORAL_SOLUTION | Freq: Every evening | ORAL | 0 refills | Status: DC | PRN
Start: 2021-08-06 — End: 2022-12-03

## 2021-08-06 MED ORDER — BENZONATATE 100 MG PO CAPS
100.0000 mg | ORAL_CAPSULE | Freq: Three times a day (TID) | ORAL | 0 refills | Status: DC | PRN
Start: 2021-08-06 — End: 2022-12-03

## 2021-08-06 MED ORDER — PREDNISONE 50 MG PO TABS: 50.0000 mg | ORAL_TABLET | Freq: Every day | ORAL | 0 refills | Status: DC

## 2021-08-06 MED ORDER — DOXYCYCLINE HYCLATE 100 MG PO CAPS: 100.0000 mg | ORAL_CAPSULE | Freq: Two times a day (BID) | ORAL | 0 refills | Status: DC

## 2021-08-06 NOTE — ED Provider Notes (Signed)
[place]   MRN: [id number] DOB: May 21, 1934  Subjective:   [name] is a 88 y.o. female presenting for 3 to 4-week history of persistent coughing, intermittent chest congestion.  Patient was seen on 07/23/2021, underwent a steroid course in the setting of allergic rhinitis and asthma.  She had some improvement but ended up going to see her regular doctor.  Tested for COVID 19 and was positive on 08/01/2021.  No chest pain, fevers, shortness of breath or wheezing.  No current facility-administered medications for this encounter.  Current Outpatient Medications:    acetaminophen (TYLENOL) 325 MG tablet, Take 2 tablets (650 mg total) by mouth every 6 (six) hours as needed for moderate pain., Disp: 30 tablet, Rfl: 0   albuterol (VENTOLIN HFA) 108 (90 Base) MCG/ACT inhaler, Inhale 1-2 puffs into the lungs every 6 (six) hours as needed for wheezing or shortness of breath., Disp: 18 g, Rfl: 0   benzonatate (TESSALON) 100 MG capsule, Take 1-2 capsules (100-200 mg total) by mouth 3 (three) times daily as needed for cough., Disp: 60 capsule, Rfl: 0   Biotin 10 MG CAPS, Take 1 capsule by mouth daily. , Disp: , Rfl:    bismuth subsalicylate (PEPTO BISMOL) 262 MG/15ML suspension, Take 30 mLs by mouth as needed., Disp: , Rfl:    carbidopa-levodopa (SINEMET IR) 25-100 MG tablet, Take 1 tablet by mouth 4 (four) times daily. Must be seen for further refills. Please call [phone] to schedule., Disp: 360 tablet, Rfl: 0   cetirizine (ZYRTEC) 10 MG tablet, Take 10 mg by mouth daily., Disp: , Rfl:    cetirizine-pseudoephedrine (ZYRTEC-D) 5-120 MG per tablet, Take 1 tablet by mouth daily. For sinus as needed, Disp: , Rfl:    Cholecalciferol (D3-1000 PO), Take by mouth daily., Disp: , Rfl:    diphenhydrAMINE (BENADRYL) 25 MG tablet, Take 25 mg by mouth every 6 (six) hours as needed., Disp: , Rfl:    escitalopram (LEXAPRO) 5 MG tablet, Take 10 mg by mouth daily., Disp: , Rfl:    famotidine  (PEPCID) 20 MG tablet, Take one dose before evening meal each day, may take second dose if needed. No more than two per day., Disp: 60 tablet, Rfl: 5   ketoconazole (NIZORAL) 2 % cream, Apply 1 application topically daily., Disp: 30 g, Rfl: 0   Lactase (LACTAID PO), Take by mouth. As needed, Disp: , Rfl:    levothyroxine (SYNTHROID, LEVOTHROID) 25 MCG tablet, Take 25 mcg by mouth daily., Disp: , Rfl:    loperamide (IMODIUM) 2 MG capsule, Take 2-4 mg by mouth daily., Disp: , Rfl:    mometasone (NASONEX) 50 MCG/ACT nasal spray, Place 2 sprays into the nose 2 (two) times daily as needed. For allergies, Disp: , Rfl:    montelukast (SINGULAIR) 10 MG tablet, Take 10 mg by mouth at bedtime.  , Disp: , Rfl:    Multiple Vitamins-Minerals (ZINC PO), Take by mouth daily., Disp: , Rfl:    mupirocin ointment (BACTROBAN) 2 %, Apply 1 application topically 3 (three) times daily. (Patient taking differently: Apply 1 application topically 3 (three) times daily. As needed), Disp: 30 g, Rfl: 0   naproxen (NAPROSYN) 500 MG tablet, Take 500 mg by mouth 2 (two) times daily as needed. , Disp: , Rfl:    potassium chloride (MICRO-K) 10 MEQ CR capsule, Take 20 mEq by mouth daily. , Disp: , Rfl:    predniSONE (DELTASONE) 20 MG tablet, Take 2 tablets daily with breakfast., Disp: 10 tablet,  Rfl: 0   promethazine-dextromethorphan (PROMETHAZINE-DM) 6.25-15 MG/5ML syrup, Take 5 mLs by mouth at bedtime as needed for cough., Disp: 100 mL, Rfl: 0   QVAR REDIHALER 80 MCG/ACT AERB, INHALATION TAKE 2 PUFFS DAILY TO PREVENT BREATHING PROBLEMS., Disp: , Rfl: 7   Simethicone (GAS-X PO), Take by mouth. As needed, Disp: , Rfl:    torsemide (DEMADEX) 10 MG tablet, Take 10 mg by mouth daily. 1/2 to 1 tab daily, Disp: , Rfl:    Allergies  Allergen Reactions   Cephalosporins Rash   Latex Rash    Breaks out then gets infected   Lactose Intolerance (Gi) Nausea And Vomiting   Lactulose Nausea And Vomiting   Cephalexin Rash   Levofloxacin  Anxiety and Rash   Meperidine And Related Nausea And Vomiting    Needed Phenergan to combat nausea.   Omeprazole Rash    Past Medical History:  Diagnosis Date   Ankle swelling    takes torsemide prn   Anxiety    Arthritis    Asthma    allergy induced   Back pain    Complication of anesthesia    Depression    GERD (gastroesophageal reflux disease)    Hypercholesterolemia    Hypothyroid    Parkinson disease ([place])    PONV (postoperative nausea and vomiting)    Seasonal allergies      Past Surgical History:  Procedure Laterality Date   ABDOMINAL HYSTERECTOMY     CARPAL TUNNEL RELEASE Right 10/26/2015   Procedure: CARPAL TUNNEL RELEASE;  Surgeon: Daryll Brod, MD;  Location: [place];  Service: Orthopedics;  Laterality: Right;   CARPOMETACARPEL SUSPENSION PLASTY Right 10/26/2015   Procedure: RIGHT HAND TRAPEZIECTOMY WITH THUMB METACARPEL SUSPENSION PLASTY;  Surgeon: Daryll Brod, MD;  Location: [place];  Service: Orthopedics;  Laterality: Right;   CATARACT EXTRACTION W/PHACO  12/23/2011   Procedure: CATARACT EXTRACTION PHACO AND INTRAOCULAR LENS PLACEMENT (IOC);  Surgeon: Williams Che, MD;  Location: [facility];  Service: Ophthalmology;  Laterality: Right;  CDE=5.16   CHOLECYSTECTOMY     EYE SURGERY     cataract extraction of left eye-[facility]-2002   KNEE ARTHROSCOPY     right   NISSEN FUNDOPLICATION     [facility]   TRIGGER FINGER RELEASE Right 10/26/2015   Procedure: RELEASE A-1 PULLEY RIGHT RING FINGER;  Surgeon: Daryll Brod, MD;  Location: [place];  Service: Orthopedics;  Laterality: Right;    Family History  Problem Relation Age of Onset   Stroke Mother    COPD Father    Stroke Father    Colon cancer Neg Hx     Social History   Tobacco Use   Smoking status: Never   Smokeless tobacco: Never  Vaping Use   Vaping Use: Never used  Substance Use Topics   Alcohol use: Yes    Alcohol/week: 1.0 standard drink    Types: 1 Glasses  of wine per week    Comment: occ   Drug use: No    ROS   Objective:   Vitals: BP 136/72    Pulse 82    Temp 98.4 F (36.9 C)    Resp 18    SpO2 95%   Physical Exam Constitutional:      General: She is not in acute distress.    Appearance: Normal appearance. She is well-developed. She is not ill-appearing, toxic-appearing or diaphoretic.  HENT:     Head: Normocephalic and atraumatic.     Nose:  Nose normal.     Mouth/Throat:     Mouth: Mucous membranes are moist.  Eyes:     General: No scleral icterus.       Right eye: No discharge.        Left eye: No discharge.     Extraocular Movements: Extraocular movements intact.  Cardiovascular:     Rate and Rhythm: Normal rate.     Heart sounds: No murmur heard.   No friction rub. No gallop.  Pulmonary:     Effort: Pulmonary effort is normal. No respiratory distress.     Breath sounds: No stridor. No wheezing, rhonchi or rales.  Chest:     Chest wall: No tenderness.  Skin:    General: Skin is warm and dry.  Neurological:     General: No focal deficit present.     Mental Status: She is alert and oriented to person, place, and time.  Psychiatric:        Mood and Affect: Mood normal.        Behavior: Behavior normal.    CLINICAL DATA: Cough and congestion for 1 week.  EXAM: CHEST - 2 VIEW  COMPARISON: 05/08/2021  FINDINGS: Chronic bronchitic type changes and streaky right upper lobe scarring changes. Streaky right basilar atelectasis medially. No definite infiltrates or effusions. No definite infiltrates, edema or effusions. No pulmonary lesions. The bony thorax is intact.  IMPRESSION: Chronic underlying bronchitic type lung changes.  Streaky right basilar atelectasis.   Electronically Signed By: Marijo Sanes M.D. On: 08/06/2021 13:39    Assessment and Plan :   PDMP not reviewed this encounter.  1. Clinical diagnosis of COVID-19   2. Abnormal chest x-ray   3. Atelectasis of right lung   4. Moderate  persistent asthma without complication    As the patient had streaky right basilar atelectasis recommended an antibiotic to address infectious process.  We will otherwise use 1 more round of steroids, incentive spirometry.  Recommend supportive care otherwise.  Patient has hemodynamically stable vital signs and therefore I do believe that she is still appropriate for outpatient management with close follow-up with her PCP or [place]. Counseled patient on potential for adverse effects with medications prescribed/recommended today, ER and return-to-clinic precautions discussed, patient verbalized understanding.    Jaynee Eagles, PA-C 08/06/21 1430

## 2021-08-06 NOTE — ED Triage Notes (Signed)
Pt presents with worsening cough since last visit, pt tested positive for covid last wednesday

## 2021-08-16 ENCOUNTER — Other Ambulatory Visit: Payer: Self-pay

## 2021-08-16 ENCOUNTER — Ambulatory Visit (HOSPITAL_COMMUNITY): Payer: Self-pay

## 2021-08-16 ENCOUNTER — Other Ambulatory Visit (HOSPITAL_COMMUNITY): Payer: Self-pay | Admitting: Family Medicine

## 2021-08-16 ENCOUNTER — Ambulatory Visit (HOSPITAL_COMMUNITY)
Admission: RE | Admit: 2021-08-16 | Discharge: 2021-08-16 | Disposition: A | Discharge status: Home / Self Care | Payer: Medicare Other | Source: Ambulatory Visit | Attending: Family Medicine | Admitting: Family Medicine

## 2021-08-16 DIAGNOSIS — R053 Chronic cough: Secondary | ICD-10-CM

## 2021-08-29 IMAGING — RF DG UGI W/ HIGH DENSITY W/O KUB
12 of 16 series · 14 of 24 positions shown · non-contrast
Comparison: CT chest 09/19/2014

CLINICAL DATA: Post Nissen fundoplication in 5111, refractory
nausea vomiting question slipped fundoplication wrap

EXAM:
UPPER GI SERIES WITH KUB
TECHNIQUE: After obtaining a scout radiograph a routine upper GI series was
performed using thin and high density barium.
FLUOROSCOPY TIME:  Fluoroscopy Time:  3 minutes 0 seconds
Radiation Exposure Index (if provided by the fluoroscopic device):
88.1 mGy
Number of Acquired Spot Images: 1 plus multiple fluoroscopic screen
captures

[Series 1: t ap supine · 0.15mm/px · 1 of 1 slices shown]
[im 1/1]
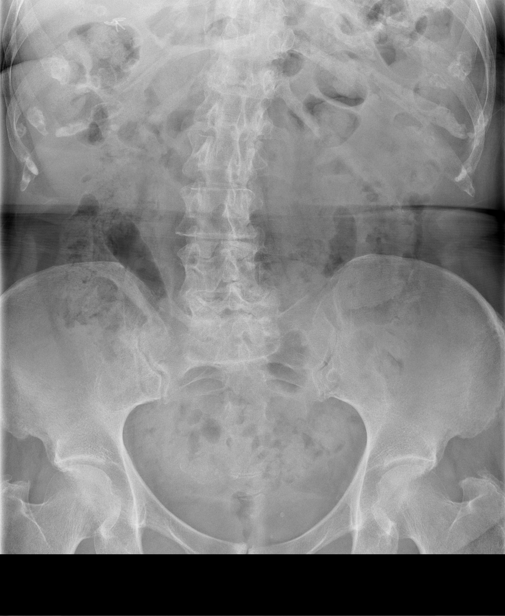

[Series 3: cp_standard · 0.18mm/px · 1 of 72 frames shown (1 of 11)]
[frame 11/72]
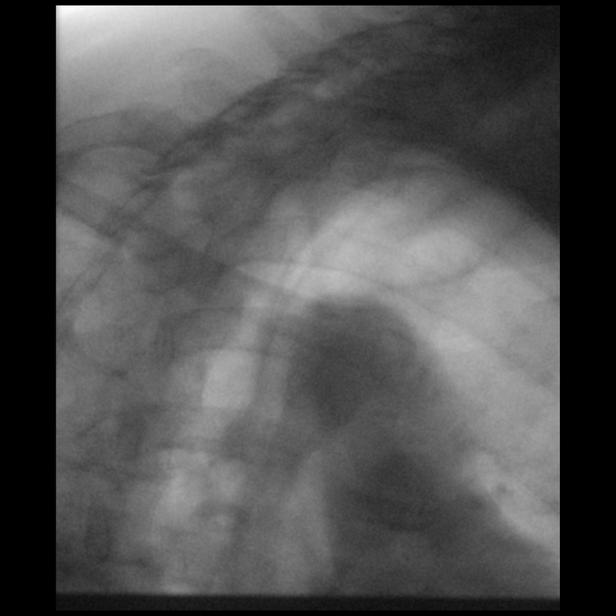

[Series 4: cp_standard · 0.18mm/px · 1 of 90 frames shown (2 of 11)]
[frame 20/90]
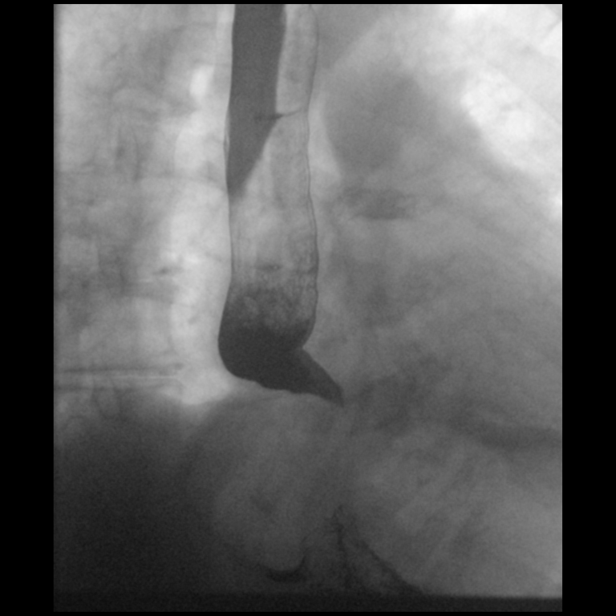

[Series 7: cp_standard · 0.18mm/px · 2 of 84 frames shown (3 of 11)]
[frame 43/84]
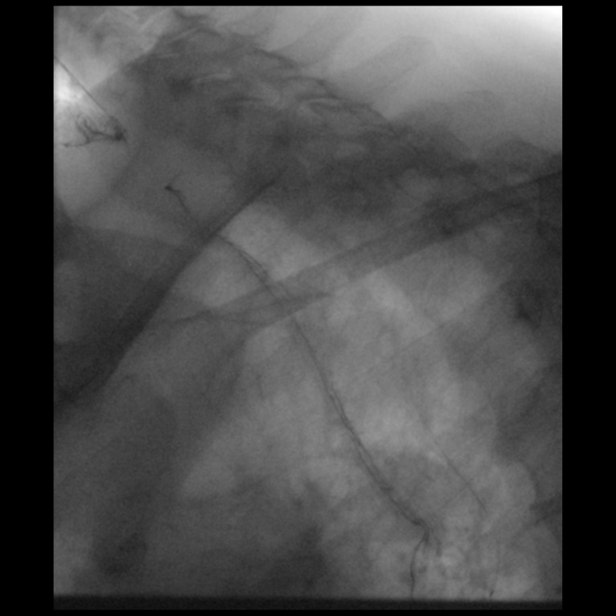
[frame 72/84]
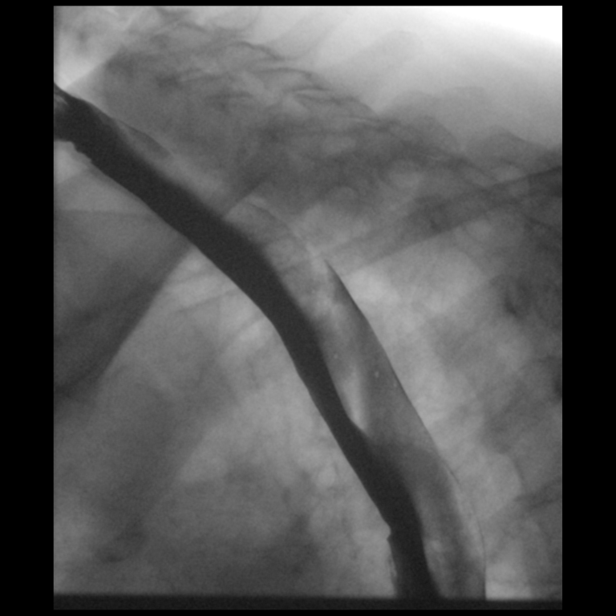

[Series 9: cp_standard · 0.18mm/px · 1 of 80 frames shown (4 of 11)]
[frame 1/80]
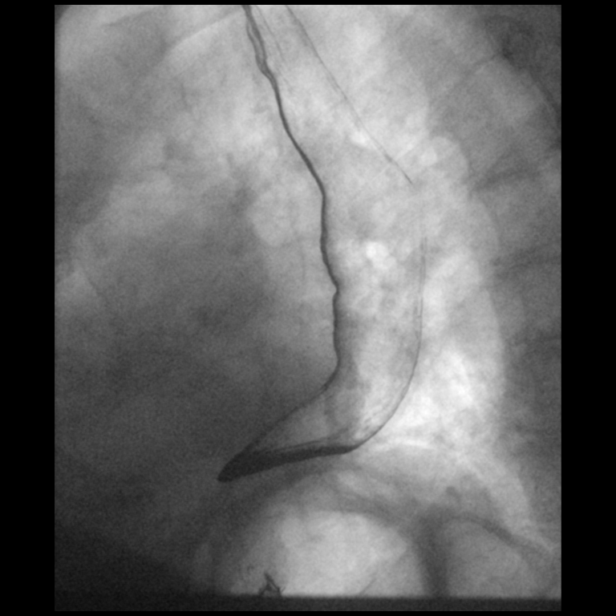

[Series 10: cp_standard · 0.18mm/px · 2 of 82 frames shown (5 of 11)]
[frame 13/82]
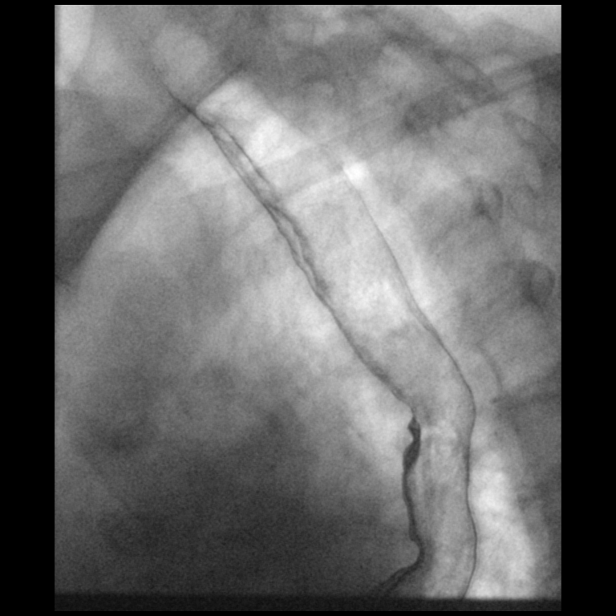
[frame 70/82]
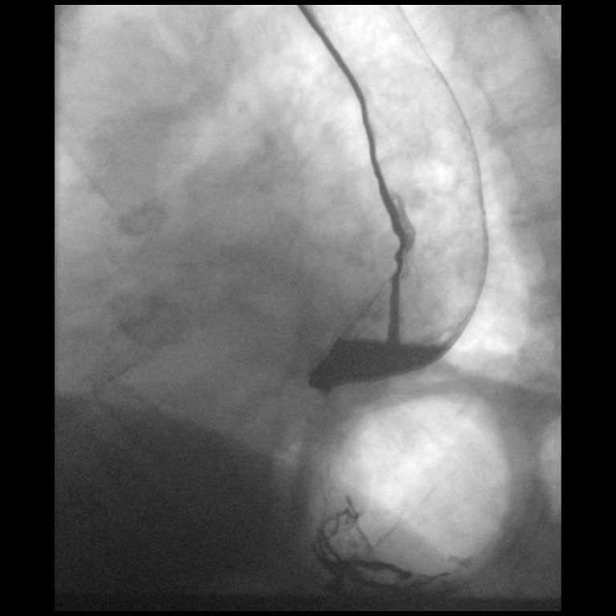

[Series 15: cp_standard · 0.18mm/px · 1 of 1 slices shown (6 of 11)]
[im 1/1]
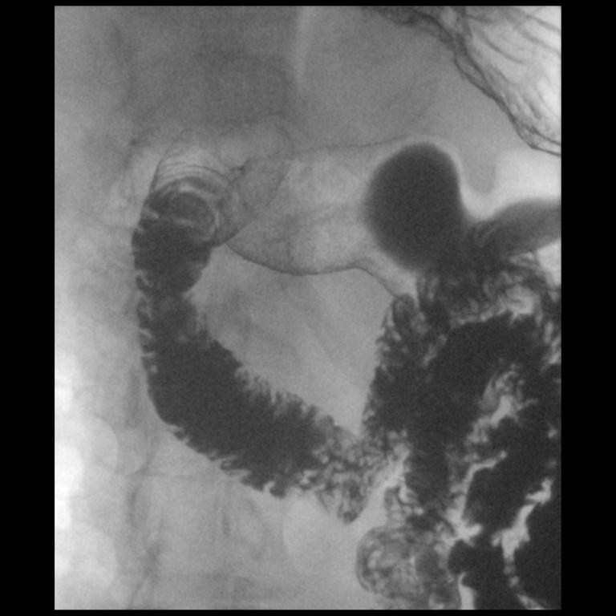

[Series 20: cp_standard · 0.18mm/px · 1 of 1 slices shown (7 of 11)]
[im 1/1]
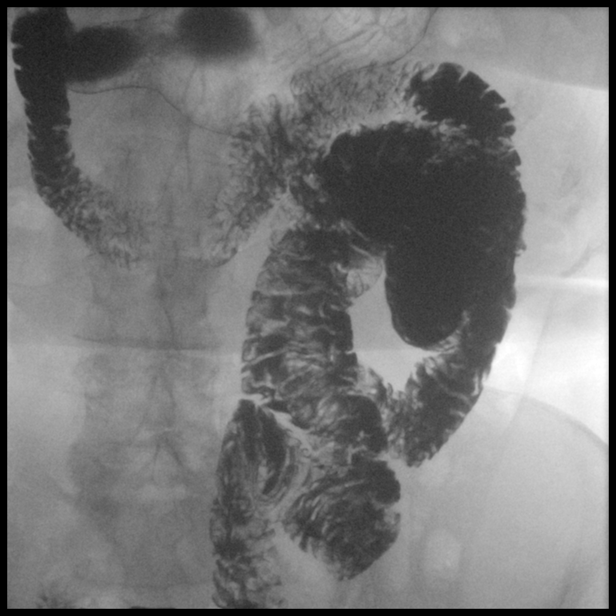

[Series 22: cp_standard · 0.17mm/px · 1 of 18 frames shown (8 of 11)]
[frame 18/18]
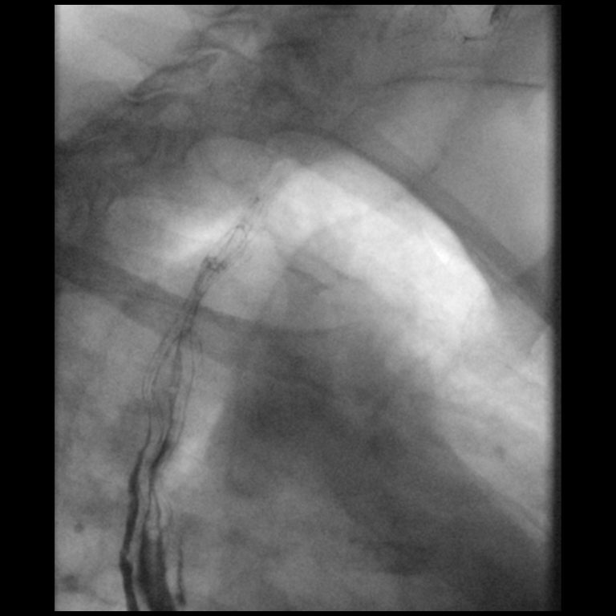

[Series 22: cp_standard · 0.17mm/px · 1 of 34 frames shown (9 of 11)]
[frame 18/34]
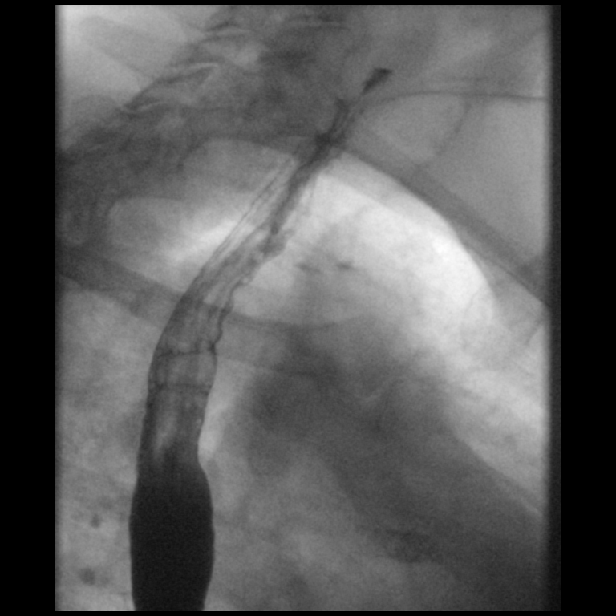

[Series 24: cp_standard · 0.17mm/px · 1 of 16 frames shown (10 of 11)]
[frame 9/16]
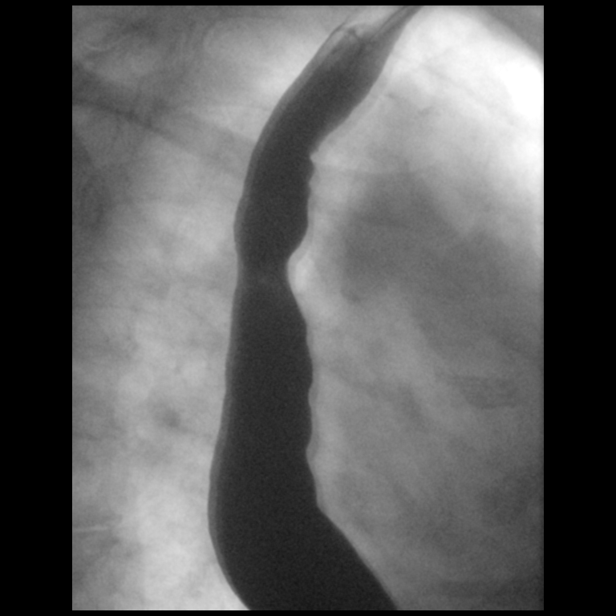

[Series 25: cp_standard · 0.18mm/px · 1 of 90 frames shown (11 of 11)]
[frame 90/90]
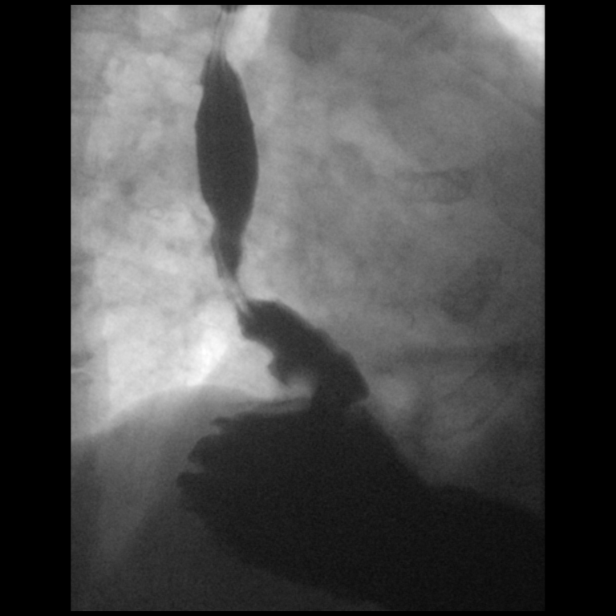

[14 of 24 positions shown; findings below may reference images not displayed]

FINDINGS: Normal bowel gas pattern on scout image.

No bowel dilatation or bowel wall thickening.

Bones demineralized with degenerative changes and dextroconvex
scoliosis lumbar spine.

Mild osteitis pubis.

Normal esophageal distention without mass or stricture.

12.5 mm diameter barium tablet passed from oral cavity to stomach
without obstruction.

Diffuse age-related esophageal dysmotility with poor clearance by
primary peristaltic waves, which appear weak and infrequent;
clearance of contrast is significantly assisted by gravity/upright
positioning.

Smooth esophageal mucosa.

Stomach distends normally with normal rugal fold pattern.

No discrete mass effect upon the GE junction or distal esophagus is
seen as is typically noted within intact fundoplication wrap.

However no hiatal hernia or gastroesophageal reflux were identified
during the study.

No gastric outlet obstruction.

Duodenal bulb and sweep normal appearance.

Visualize jejunal loops unremarkable.
IMPRESSION: Diffuse esophageal dysmotility.

No evidence of esophageal mass or stricture.

Typical extrinsic compression of the GE junction and distal
esophagus from an intact fundoplication wrap is not identified
during the exam but no hiatal hernia or gastroesophageal reflux were
seen during the exam.

Otherwise normal upper GI exam.

## 2021-12-10 ENCOUNTER — Emergency Department (HOSPITAL_COMMUNITY): Payer: Medicare Other

## 2021-12-10 ENCOUNTER — Observation Stay (HOSPITAL_COMMUNITY)
Admission: EM | Admit: 2021-12-10 | Discharge: 2021-12-11 | Disposition: A | Discharge status: Home / Self Care | Payer: Medicare Other | Attending: Internal Medicine | Admitting: Internal Medicine

## 2021-12-10 ENCOUNTER — Other Ambulatory Visit: Payer: Self-pay

## 2021-12-10 ENCOUNTER — Encounter (HOSPITAL_COMMUNITY): Payer: Self-pay | Admitting: *Deleted

## 2021-12-10 DIAGNOSIS — Z91040 Latex allergy status: Secondary | ICD-10-CM | POA: Insufficient documentation

## 2021-12-10 DIAGNOSIS — D649 Anemia, unspecified: Secondary | ICD-10-CM

## 2021-12-10 DIAGNOSIS — G20 Parkinson's disease: Secondary | ICD-10-CM | POA: Diagnosis not present

## 2021-12-10 DIAGNOSIS — Z79899 Other long term (current) drug therapy: Secondary | ICD-10-CM | POA: Diagnosis not present

## 2021-12-10 DIAGNOSIS — E039 Hypothyroidism, unspecified: Secondary | ICD-10-CM

## 2021-12-10 DIAGNOSIS — F32A Depression, unspecified: Secondary | ICD-10-CM

## 2021-12-10 DIAGNOSIS — J45909 Unspecified asthma, uncomplicated: Secondary | ICD-10-CM | POA: Insufficient documentation

## 2021-12-10 DIAGNOSIS — S60561A Insect bite (nonvenomous) of right hand, initial encounter: Secondary | ICD-10-CM

## 2021-12-10 DIAGNOSIS — W57XXXA Bitten or stung by nonvenomous insect and other nonvenomous arthropods, initial encounter: Secondary | ICD-10-CM | POA: Insufficient documentation

## 2021-12-10 DIAGNOSIS — L0390 Cellulitis, unspecified: Secondary | ICD-10-CM | POA: Diagnosis present

## 2021-12-10 DIAGNOSIS — L03113 Cellulitis of right upper limb: Secondary | ICD-10-CM | POA: Diagnosis not present

## 2021-12-10 DIAGNOSIS — G20A1 Parkinson's disease without dyskinesia, without mention of fluctuations: Secondary | ICD-10-CM | POA: Diagnosis present

## 2021-12-10 HISTORY — DX: Unspecified hearing loss, unspecified ear: H91.90

## 2021-12-10 LAB — COMPREHENSIVE METABOLIC PANEL
ALT: 9 U/L (ref 0–44)
AST: 27 U/L (ref 15–41)
Albumin: 3.5 g/dL (ref 3.5–5.0)
Alkaline Phosphatase: 84 U/L (ref 38–126)
Anion gap: 10 (ref 5–15)
BUN: 14 mg/dL (ref 8–23)
CO2: 25 mmol/L (ref 22–32)
Calcium: 8.4 mg/dL — ABNORMAL LOW (ref 8.9–10.3)
Chloride: 105 mmol/L (ref 98–111)
Creatinine, Ser: 0.95 mg/dL (ref 0.44–1.00)
GFR, Estimated: 58 mL/min — ABNORMAL LOW (ref >60)
Glucose, Bld: 102 mg/dL — ABNORMAL HIGH (ref 70–99)
Potassium: 3.6 mmol/L (ref 3.5–5.1)
Sodium: 140 mmol/L (ref 135–145)
Total Bilirubin: 0.3 mg/dL (ref 0.3–1.2)
Total Protein: 6.6 g/dL (ref 6.5–8.1)

## 2021-12-10 LAB — CBC WITH DIFFERENTIAL/PLATELET
Abs Immature Granulocytes: 0.21 10*3/uL — ABNORMAL HIGH (ref 0.00–0.07)
Basophils Absolute: 0 10*3/uL (ref 0.0–0.1)
Basophils Relative: 0 %
Eosinophils Absolute: 0.1 10*3/uL (ref 0.0–0.5)
Eosinophils Relative: 1 %
HCT: 36 % (ref 36.0–46.0)
Hemoglobin: 11.5 g/dL — ABNORMAL LOW (ref 12.0–15.0)
Immature Granulocytes: 3 %
Lymphocytes Relative: 21 %
Lymphs Abs: 1.6 10*3/uL (ref 0.7–4.0)
MCH: 30.2 pg (ref 26.0–34.0)
MCHC: 31.9 g/dL (ref 30.0–36.0)
MCV: 94.5 fL (ref 80.0–100.0)
Monocytes Absolute: 1.3 10*3/uL — ABNORMAL HIGH (ref 0.1–1.0)
Monocytes Relative: 17 %
Neutro Abs: 4.4 10*3/uL (ref 1.7–7.7)
Neutrophils Relative %: 58 %
Platelets: 231 10*3/uL (ref 150–400)
RBC: 3.81 MIL/uL — ABNORMAL LOW (ref 3.87–5.11)
RDW: 13.7 % (ref 11.5–15.5)
WBC: 7.7 10*3/uL (ref 4.0–10.5)
nRBC: 0 % (ref 0.0–0.2)

## 2021-12-10 LAB — SEDIMENTATION RATE: Sed Rate: 52 mm/hr — ABNORMAL HIGH (ref 0–22)

## 2021-12-10 LAB — C-REACTIVE PROTEIN: CRP: 14.8 mg/dL — ABNORMAL HIGH (ref <1.0)

## 2021-12-10 LAB — LACTIC ACID, PLASMA: Lactic Acid, Venous: 1.2 mmol/L (ref 0.5–1.9)

## 2021-12-10 MED ORDER — VANCOMYCIN HCL IN DEXTROSE 1-5 GM/200ML-% IV SOLN: 1000.0000 mg | INTRAVENOUS | Status: DC

## 2021-12-10 MED ORDER — ONDANSETRON HCL 4 MG/2ML IJ SOLN: 4.0000 mg | Freq: Four times a day (QID) | INTRAMUSCULAR | Status: DC | PRN

## 2021-12-10 MED ORDER — CARBIDOPA-LEVODOPA 25-100 MG PO TABS
1.0000 | ORAL_TABLET | Freq: Four times a day (QID) | ORAL | Status: DC
  Filled 2021-12-10 (×6): qty 1

## 2021-12-10 MED ORDER — LEVOTHYROXINE SODIUM 25 MCG PO TABS
25.0000 ug | ORAL_TABLET | Freq: Every day | ORAL | Status: DC
  Administered 2021-12-11: 25 ug via ORAL
  Filled 2021-12-10: qty 1

## 2021-12-10 MED ORDER — ENOXAPARIN SODIUM 40 MG/0.4ML IJ SOSY
40.0000 mg | PREFILLED_SYRINGE | INTRAMUSCULAR | Status: DC
  Administered 2021-12-10: 40 mg via SUBCUTANEOUS
  Filled 2021-12-10: qty 0.4

## 2021-12-10 MED ORDER — CALCIUM GLUCONATE-NACL 1-0.675 GM/50ML-% IV SOLN
1.0000 g | Freq: Once | INTRAVENOUS | Status: AC
Start: 2021-12-10 — End: 2021-12-11
  Administered 2021-12-10: 1000 mg via INTRAVENOUS
  Filled 2021-12-10: qty 50

## 2021-12-10 MED ORDER — VANCOMYCIN HCL 1750 MG/350ML IV SOLN
1750.0000 mg | Freq: Once | INTRAVENOUS | Status: AC
  Administered 2021-12-10: 1750 mg via INTRAVENOUS
  Filled 2021-12-10: qty 350

## 2021-12-10 MED ORDER — SODIUM CHLORIDE 0.9% FLUSH
3.0000 mL | Freq: Two times a day (BID) | INTRAVENOUS | Status: DC
  Administered 2021-12-10 – 2021-12-11 (×3): 3 mL via INTRAVENOUS

## 2021-12-10 MED ORDER — ONDANSETRON HCL 4 MG PO TABS: 4.0000 mg | ORAL_TABLET | Freq: Four times a day (QID) | ORAL | Status: DC | PRN

## 2021-12-10 MED ORDER — MONTELUKAST SODIUM 10 MG PO TABS
10.0000 mg | ORAL_TABLET | Freq: Every day | ORAL | Status: DC
  Administered 2021-12-10: 10 mg via ORAL
  Filled 2021-12-10: qty 1

## 2021-12-10 MED ORDER — TORSEMIDE 20 MG PO TABS
10.0000 mg | ORAL_TABLET | Freq: Every day | ORAL | Status: DC
  Administered 2021-12-11: 10 mg via ORAL
  Filled 2021-12-10: qty 1

## 2021-12-10 MED ORDER — MORPHINE SULFATE (PF) 2 MG/ML IV SOLN
2.0000 mg | Freq: Once | INTRAVENOUS | Status: AC
  Administered 2021-12-10: 2 mg via INTRAVENOUS
  Filled 2021-12-10: qty 1

## 2021-12-10 MED ORDER — ALBUTEROL SULFATE (2.5 MG/3ML) 0.083% IN NEBU: 2.5000 mg | INHALATION_SOLUTION | Freq: Four times a day (QID) | RESPIRATORY_TRACT | Status: DC | PRN

## 2021-12-10 MED ORDER — HYDROCODONE-ACETAMINOPHEN 5-325 MG PO TABS
1.0000 | ORAL_TABLET | ORAL | Status: DC | PRN
  Administered 2021-12-10 (×2): 1 via ORAL
  Administered 2021-12-11 (×2): 2 via ORAL
  Filled 2021-12-10: qty 2
  Filled 2021-12-10 (×3): qty 1
  Filled 2021-12-10: qty 2

## 2021-12-10 MED ORDER — CITALOPRAM HYDROBROMIDE 20 MG PO TABS
20.0000 mg | ORAL_TABLET | Freq: Every day | ORAL | Status: DC
  Administered 2021-12-11: 20 mg via ORAL
  Filled 2021-12-10: qty 1

## 2021-12-10 NOTE — ED Notes (Signed)
Pt and family member asking questions about plan of care. Increased swelling in hand and arm that is traveling further up her arm. Will notify provider

## 2021-12-10 NOTE — H&P (Signed)
History and Physical    Patient: [name] DOB: 1933/07/26 DOA: 12/10/2021 DOS: the patient was seen and examined on 12/10/2021 PCP: Leslie Andrea, MD  Patient coming from: Home  Chief Complaint:  Chief Complaint  Patient presents with   Insect Bite   HPI: [name] is a 88 y.o. female with medical history significant of hyperlipidemia, asthma hypothyroidism, anxiety, depression, and Parkinson's disease who presents with complaints of redness and swelling of right hand after insect bite 5 days ago.  [place] and had pulled down one of the lesions when she was bit by something on the dorsal aspect of her right hand.  She was not sure what exactly bit her at that, but noticed some blood coming from that area.   Any kind movement of her hand cause significant pain.  She had seen her primary care provider 3 days ago and was recommended to take some over-the-counter Benadryl, but it provided no relief. Since that time she has had progressively worsening swelling and redness tracking up her arm. She had some drainage coming from the area which was then this morning.  Patient had taken some hydrocodone this morning with temporary relief in symptoms.  Upon admission into the emergency department patient was noted to be afebrile with relatively stable vital signs.  Labs noted WBC 7.7, hemoglobin 11.5, calcium 8.4, and lactic acid 1.2. X-rays of the right hand noted moderate to severe osteoarthritis of IP joints, and diffuse soft tissue swelling of the distal right forearm and hand.  A single blood culture was obtained.  She was given morphine 2 mg IV for pain and started on empiric antibiotics of vancomycin.  Review of Systems: As mentioned in the history of present illness. All other systems reviewed and are negative. Past Medical History:  Diagnosis Date   Ankle swelling    takes torsemide prn   Anxiety    Arthritis    Asthma     allergy induced   Back pain    Complication of anesthesia    Depression    GERD (gastroesophageal reflux disease)    HOH (hard of hearing)    Hypercholesterolemia    Hypothyroid    Parkinson disease ([place])    PONV (postoperative nausea and vomiting)    Seasonal allergies    Past Surgical History:  Procedure Laterality Date   ABDOMINAL HYSTERECTOMY     CARPAL TUNNEL RELEASE Right 10/26/2015   Procedure: CARPAL TUNNEL RELEASE;  Surgeon: Daryll Brod, MD;  Location: [place];  Service: Orthopedics;  Laterality: Right;   CARPOMETACARPEL SUSPENSION PLASTY Right 10/26/2015   Procedure: RIGHT HAND TRAPEZIECTOMY WITH THUMB METACARPEL SUSPENSION PLASTY;  Surgeon: Daryll Brod, MD;  Location: [place];  Service: Orthopedics;  Laterality: Right;   CATARACT EXTRACTION W/PHACO  12/23/2011   Procedure: CATARACT EXTRACTION PHACO AND INTRAOCULAR LENS PLACEMENT (IOC);  Surgeon: Williams Che, MD;  Location: [facility];  Service: Ophthalmology;  Laterality: Right;  CDE=5.16   CHOLECYSTECTOMY     EYE SURGERY     cataract extraction of left eye-[facility]-2002   KNEE ARTHROSCOPY     right   NISSEN FUNDOPLICATION     [facility]   TRIGGER FINGER RELEASE Right 10/26/2015   Procedure: RELEASE A-1 PULLEY RIGHT RING FINGER;  Surgeon: Daryll Brod, MD;  Location: [place];  Service: Orthopedics;  Laterality: Right;   Social History:  reports that she has never smoked.  She has never used smokeless tobacco. She reports current alcohol use of about 1.0 standard drink of alcohol per week. She reports that she does not use drugs.  Allergies  Allergen Reactions   Cephalosporins Rash   Latex Rash    Breaks out then gets infected   Lactose Intolerance (Gi) Nausea And Vomiting   Lactulose Nausea And Vomiting   Cephalexin Rash   Levofloxacin Anxiety and Rash   Meperidine And Related Nausea And Vomiting    Needed Phenergan to combat nausea.   Omeprazole Rash    Family History   Problem Relation Age of Onset   Stroke Mother    COPD Father    Stroke Father    Colon cancer Neg Hx     Prior to Admission medications   Medication Sig Start Date End Date Taking? Authorizing Provider  acetaminophen (TYLENOL) 325 MG tablet Take 2 tablets (650 mg total) by mouth every 6 (six) hours as needed for moderate pain. 06/11/21   Jaynee Eagles, PA-C  albuterol (VENTOLIN HFA) 108 (90 Base) MCG/ACT inhaler Inhale 1-2 puffs into the lungs every 6 (six) hours as needed for wheezing or shortness of breath. 07/28/21   Jaynee Eagles, PA-C  benzonatate (TESSALON) 100 MG capsule Take 1-2 capsules (100-200 mg total) by mouth 3 (three) times daily as needed for cough. 08/06/21   Jaynee Eagles, PA-C  Biotin 10 MG CAPS Take 1 capsule by mouth daily.     [provider]  bismuth subsalicylate (PEPTO BISMOL) 262 MG/15ML suspension Take 30 mLs by mouth as needed.    [provider]  carbidopa-levodopa (SINEMET IR) 25-100 MG tablet Take 1 tablet by mouth 4 (four) times daily. Must be seen for further refills. Please call [phone] to schedule. 07/24/20   Ward Givens, NP  cetirizine (ZYRTEC) 10 MG tablet Take 10 mg by mouth daily.    [provider]  cetirizine-pseudoephedrine (ZYRTEC-D) 5-120 MG per tablet Take 1 tablet by mouth daily. For sinus as needed    [provider]  Cholecalciferol (D3-1000 PO) Take by mouth daily.    [provider]  diphenhydrAMINE (BENADRYL) 25 MG tablet Take 25 mg by mouth every 6 (six) hours as needed.    [provider]  doxycycline (VIBRAMYCIN) 100 MG capsule Take 1 capsule (100 mg total) by mouth 2 (two) times daily. 08/06/21   Jaynee Eagles, PA-C  escitalopram (LEXAPRO) 5 MG tablet Take 10 mg by mouth daily.    [provider]  famotidine (PEPCID) 20 MG tablet Take one dose before evening meal each day, may take second dose if needed. No more than two per day. 07/10/21   Mahala Menghini, PA-C  ketoconazole  (NIZORAL) 2 % cream Apply 1 application topically daily. 06/11/21   Jaynee Eagles, PA-C  Lactase (LACTAID PO) Take by mouth. As needed    [provider]  levothyroxine (SYNTHROID, LEVOTHROID) 25 MCG tablet Take 25 mcg by mouth daily. 03/09/14   [provider]  loperamide (IMODIUM) 2 MG capsule Take 2-4 mg by mouth daily.    [provider]  mometasone (NASONEX) 50 MCG/ACT nasal spray Place 2 sprays into the nose 2 (two) times daily as needed. For allergies    [provider]  montelukast (SINGULAIR) 10 MG tablet Take 10 mg by mouth at bedtime.      [provider]  Multiple Vitamins-Minerals (ZINC PO) Take by mouth daily.    [provider]  mupirocin ointment (BACTROBAN) 2 % Apply  1 application topically 3 (three) times daily. Patient taking differently: Apply 1 application topically 3 (three) times daily. As needed 06/11/21   Jaynee Eagles, PA-C  naproxen (NAPROSYN) 500 MG tablet Take 500 mg by mouth 2 (two) times daily as needed.     [provider]  potassium chloride (MICRO-K) 10 MEQ CR capsule Take 20 mEq by mouth daily.  08/13/13   [provider]  predniSONE (DELTASONE) 50 MG tablet Take 1 tablet (50 mg total) by mouth daily with breakfast. 08/06/21   Jaynee Eagles, PA-C  promethazine-dextromethorphan (PROMETHAZINE-DM) 6.25-15 MG/5ML syrup Take 5 mLs by mouth at bedtime as needed for cough. 08/06/21   Jaynee Eagles, PA-C  QVAR REDIHALER 80 MCG/ACT AERB INHALATION TAKE 2 PUFFS DAILY TO PREVENT BREATHING PROBLEMS. 09/28/16   [provider]  Simethicone (GAS-X PO) Take by mouth. As needed    [provider]  torsemide (DEMADEX) 10 MG tablet Take 10 mg by mouth daily. 1/2 to 1 tab daily    [provider]    Physical Exam: Vitals:   12/10/21 1223 12/10/21 1224 12/10/21 1516 12/10/21 1517  BP: (!) 146/88   111/63  Pulse: 92   68  Resp: 16   16  Temp: 97.9 F (36.6 C)  97.9 F (36.6 C)   TempSrc: Oral   Oral   SpO2: 99%   94%  Weight:  74.4 kg    Height:  5' 2"  (1.575 m)      Constitutional: NAD, calm, comfortable Eyes: PERRL, lids and conjunctivae normal ENMT: Mucous membranes are moist. Posterior pharynx clear of any exudate or lesions.Normal dentition.  Neck: normal, supple, no masses, no thyromegaly Respiratory: clear to auscultation bilaterally, no wheezing, no crackles. Normal respiratory effort. No accessory muscle use.  Cardiovascular: Regular rate and rhythm, no murmurs / rubs / gallops. No extremity edema. 2+ pedal pulses. No carotid bruits.  Abdomen: no tenderness, no masses palpated. No hepatosplenomegaly. Bowel sounds positive.  Musculoskeletal: no clubbing / cyanosis. No joint deformity upper and lower extremities. Good ROM, no contractures. Normal muscle tone.  Skin: no rashes, lesions, ulcers. No induration Neurologic: CN 2-12 grossly intact. Sensation intact, DTR normal. Strength 5/5 in all 4.  Psychiatric: Normal judgment and insight. Alert and oriented x 3. Normal mood.   Data Reviewed:  Reviewed labs and imaging as noted above in HPI  Assessment and Plan:  Cellulitis of the right hand secondary to insect bite Acute.  Patient presents with complaints progressively worsening redness and swelling of the right hand to her forearm after insect bite 5 days ago.  X-ray imaging noted diffuse subcutaneous swelling.  Single blood culture was obtained and patient was started on empiric antibiotics of vancomycin.  Records note patient with previous episode of cellulitis earlier this year treated with doxycycline -Admit to MedSurg bed -Follow-up blood culture -Hydrocodone/morphine IV as needed for moderate to severe pain respectively -Continue vancomycin and de-escalate antibiotics when medically appropriate  Hypocalcemia Acute.  On admission calcium noted to be 8.4 with albumin within normal limits. -Give 1 g of calcium gluconate IV -Continue to monitor and replace as  needed  Normocytic normochromic anemia Acute.  Hemoglobin 11.5 g/dL on admission.  Hemoglobin previously had been within normal limits.  No reports of bleeding. -Continue to monitor H&H  Depression Home medication regimen includes Lexapro 10 mg daily. -Continue pharmacy substitution of citalopram  Parkinson's disease -Resume home regimen once verified  Hypothyroidism -Continue levothyroxine  DVT prophylaxis: Lovenox Advance Care Planning:  Code Status: Full Code   Consults: None  Family Communication: Message left over the phone for patient son  Severity of Illness: The appropriate patient status for this patient is INPATIENT. Inpatient status is judged to be reasonable and necessary in order to provide the required intensity of service to ensure the patient's safety. The patient's presenting symptoms, physical exam findings, and initial radiographic and laboratory data in the context of their chronic comorbidities is felt to place them at high risk for further clinical deterioration. Furthermore, it is not anticipated that the patient will be medically stable for discharge from the hospital within 2 midnights of admission.   * I certify that at the point of admission it is my clinical judgment that the patient will require inpatient hospital care spanning beyond 2 midnights from the point of admission due to high intensity of service, high risk for further deterioration and high frequency of surveillance required.*  Author: Norval Morton, MD 12/10/2021 3:23 PM  For on call review [email or web address].

## 2021-12-10 NOTE — ED Provider Notes (Signed)
[facility] EMERGENCY DEPARTMENT Provider Note   CSN: [id number] Arrival date & time: 12/10/21  1153     History  Chief Complaint  Patient presents with   Insect Bite    [name] is a 88 y.o. female.  Patient with history of asthma, allergic rhinitis, GERD presents today with complaints of insect bite.  She states that last Wednesday she was picking blueberries and felt something bite her on her right hand. States that same was red and swollen almost immediately afterwards and has been progressively worsening since. She states that she went to her PCP last Friday and was just told to take benadryl which she has been regularly without any improvement. No antibiotics were ordered at that time. Patient states that since then her pain and redness have continued to worsen and around 3 am this morning the area on the back of her hand where she was bitten ruptured and drained purulence which concerned her and prompted her visit today. She states that she has not had any known fevers but has not been checking her temperature. Took her prescribed home Norco at 11 am this morning with some relief. Denies any history of diabetes.  The history is provided by the patient. No language interpreter was used.       Home Medications Prior to Admission medications   Medication Sig Start Date End Date Taking? Authorizing Provider  acetaminophen (TYLENOL) 325 MG tablet Take 2 tablets (650 mg total) by mouth every 6 (six) hours as needed for moderate pain. 06/11/21   Jaynee Eagles, PA-C  albuterol (VENTOLIN HFA) 108 (90 Base) MCG/ACT inhaler Inhale 1-2 puffs into the lungs every 6 (six) hours as needed for wheezing or shortness of breath. 07/28/21   Jaynee Eagles, PA-C  benzonatate (TESSALON) 100 MG capsule Take 1-2 capsules (100-200 mg total) by mouth 3 (three) times daily as needed for cough. 08/06/21   Jaynee Eagles, PA-C  Biotin 10 MG CAPS Take 1 capsule by mouth daily.     [provider]  bismuth  subsalicylate (PEPTO BISMOL) 262 MG/15ML suspension Take 30 mLs by mouth as needed.    [provider]  carbidopa-levodopa (SINEMET IR) 25-100 MG tablet Take 1 tablet by mouth 4 (four) times daily. Must be seen for further refills. Please call [phone] to schedule. 07/24/20   Ward Givens, NP  cetirizine (ZYRTEC) 10 MG tablet Take 10 mg by mouth daily.    [provider]  cetirizine-pseudoephedrine (ZYRTEC-D) 5-120 MG per tablet Take 1 tablet by mouth daily. For sinus as needed    [provider]  Cholecalciferol (D3-1000 PO) Take by mouth daily.    [provider]  diphenhydrAMINE (BENADRYL) 25 MG tablet Take 25 mg by mouth every 6 (six) hours as needed.    [provider]  doxycycline (VIBRAMYCIN) 100 MG capsule Take 1 capsule (100 mg total) by mouth 2 (two) times daily. 08/06/21   Jaynee Eagles, PA-C  escitalopram (LEXAPRO) 5 MG tablet Take 10 mg by mouth daily.    [provider]  famotidine (PEPCID) 20 MG tablet Take one dose before evening meal each day, may take second dose if needed. No more than two per day. 07/10/21   Mahala Menghini, PA-C  ketoconazole (NIZORAL) 2 % cream Apply 1 application topically daily. 06/11/21   Jaynee Eagles, PA-C  Lactase (LACTAID PO) Take by mouth. As needed    [provider]  levothyroxine (SYNTHROID, LEVOTHROID) 25 MCG tablet Take 25 mcg by  mouth daily. 03/09/14   [provider]  loperamide (IMODIUM) 2 MG capsule Take 2-4 mg by mouth daily.    [provider]  mometasone (NASONEX) 50 MCG/ACT nasal spray Place 2 sprays into the nose 2 (two) times daily as needed. For allergies    [provider]  montelukast (SINGULAIR) 10 MG tablet Take 10 mg by mouth at bedtime.      [provider]  Multiple Vitamins-Minerals (ZINC PO) Take by mouth daily.    [provider]  mupirocin ointment (BACTROBAN) 2 % Apply 1 application topically 3 (three) times daily. Patient  taking differently: Apply 1 application topically 3 (three) times daily. As needed 06/11/21   Jaynee Eagles, PA-C  naproxen (NAPROSYN) 500 MG tablet Take 500 mg by mouth 2 (two) times daily as needed.     [provider]  potassium chloride (MICRO-K) 10 MEQ CR capsule Take 20 mEq by mouth daily.  08/13/13   [provider]  predniSONE (DELTASONE) 50 MG tablet Take 1 tablet (50 mg total) by mouth daily with breakfast. 08/06/21   Jaynee Eagles, PA-C  promethazine-dextromethorphan (PROMETHAZINE-DM) 6.25-15 MG/5ML syrup Take 5 mLs by mouth at bedtime as needed for cough. 08/06/21   Jaynee Eagles, PA-C  QVAR REDIHALER 80 MCG/ACT AERB INHALATION TAKE 2 PUFFS DAILY TO PREVENT BREATHING PROBLEMS. 09/28/16   [provider]  Simethicone (GAS-X PO) Take by mouth. As needed    [provider]  torsemide (DEMADEX) 10 MG tablet Take 10 mg by mouth daily. 1/2 to 1 tab daily    [provider]      Allergies    Cephalosporins, Latex, Lactose intolerance (gi), Lactulose, Cephalexin, Levofloxacin, Meperidine and related, and Omeprazole    Review of Systems   Review of Systems  Skin:  Positive for wound.  All other systems reviewed and are negative.   Physical Exam Updated Vital Signs BP (!) 146/88 (BP Location: Left Arm)   Pulse 92   Temp 97.9 F (36.6 C) (Oral)   Resp 16   Ht 5' 2"  (1.575 m)   Wt 74.4 kg   SpO2 99%   BMI 30.00 kg/m  Physical Exam Vitals and nursing note reviewed.  Constitutional:      General: She is not in acute distress.    Appearance: Normal appearance. She is normal weight. She is not ill-appearing, toxic-appearing or diaphoretic.  HENT:     Head: Normocephalic and atraumatic.  Cardiovascular:     Rate and Rhythm: Normal rate.  Pulmonary:     Effort: Pulmonary effort is normal. No respiratory distress.  Musculoskeletal:        General: Normal range of motion.     Cervical back: Normal range of motion.  Skin:    Comments: Erythema,  warmth, and swelling noted throughout the right hand, wrist and tracking up the right forearm. Area of mild fluctuance without induration noted to the dorsal aspect of the right hand. No active drainage noted. ROM noted with pain. Distal sensation intact. Capillary refill less than 2 seconds. See images below for further.  Neurological:     General: No focal deficit present.     Mental Status: She is alert.  Psychiatric:        Mood and Affect: Mood normal.        Behavior: Behavior normal.        ED Results / Procedures / Treatments   Labs (all labs ordered are listed, but only abnormal results are displayed)  Labs Reviewed  COMPREHENSIVE METABOLIC PANEL - Abnormal; Notable for the following components:      Result Value   Glucose, Bld 102 (*)    Calcium 8.4 (*)    GFR, Estimated 58 (*)    All other components within normal limits  CBC WITH DIFFERENTIAL/PLATELET - Abnormal; Notable for the following components:   RBC 3.81 (*)    Hemoglobin 11.5 (*)    Monocytes Absolute 1.3 (*)    Abs Immature Granulocytes 0.21 (*)    All other components within normal limits  CULTURE, BLOOD (SINGLE)  LACTIC ACID, PLASMA    EKG None  Radiology DG Hand Complete Right  Result Date: 12/10/2021 CLINICAL DATA:  Insect bite to right hand on Wednesday with redness, pain and swelling of the entire right hand since the time. EXAM: RIGHT HAND - COMPLETE 3+ VIEW COMPARISON:  Right hand radiograph 02/17/2010. FINDINGS: There is no evidence of fracture or dislocation. Postoperative changes of trapeziectomy. 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 Diffuse soft tissue swelling throughout the visualized distal right forearm and right hand. No subcutaneous emphysema. IMPRESSION: 1. No acute osseous abnormality. 2. 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 3. Diffuse soft tissue swelling of the visualized distal right forearm and right hand. No subcutaneous emphysema.  Electronically Signed   By: Ileana Roup M.D.   On: 12/10/2021 12:53    Procedures Procedures    Medications Ordered in ED Medications  vancomycin (VANCOREADY) IVPB 1750 mg/350 mL (has no administration in time range)  vancomycin (VANCOCIN) IVPB 1000 mg/200 mL premix (has no administration in time range)  morphine (PF) 2 MG/ML injection 2 mg (2 mg Intravenous Given 12/10/21 1510)    ED Course/ Medical Decision Making/ A&P                           Medical Decision Making Amount and/or Complexity of Data Reviewed Labs: ordered. Radiology: ordered.   This patient presents to the ED for concern of right hand wound, this involves an extensive number of treatment options, and is a complaint that carries with it a high risk of complications and morbidity.  The differential diagnosis includes sepsis, cellulitis. This is not an exhaustive differential   Co morbidities that complicate the patient evaluation  none   Lab Tests:  I Ordered, and personally interpreted labs.  The pertinent results include: No leukocytosis, hemoglobin 11.5. Lactic acid normal. No other acute laboratory abnormalities   Imaging Studies ordered:  I ordered imaging studies including DG right hand  I independently visualized and interpreted imaging which showed  1. No acute osseous abnormality. 2. 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 3. Diffuse soft tissue swelling of the visualized distal right forearm and right hand. No subcutaneous emphysema. I agree with the radiologist interpretation   Problem List / ED Course / Critical interventions / Medication management  I ordered medication including morphine for pain and vancomycin for cellulitis Reevaluation of the patient after these medicines showed that the patient improved I have reviewed the patients home medicines and have made adjustments as needed  Test / Admission - Considered:  Patient presents today with complaints of insect  bite to the right hand 5 days ago.  She is afebrile, nontoxic-appearing, and in no acute distress with reassuring vital signs.  Laboratory evaluation did not reveal any leukocytosis or lactic acidosis.  X-ray imaging showed diffuse soft tissue swelling, but no concerns for osteomyelitis or necrotizing fasciitis. However, given patients  age and her erythema that is tracking up her arm and worsening since arrival earlier today, patient will need to be admitted for IV antibiotics. Started on vancomycin in the ER today. Patient is understanding and amenable with plan.  Discussed patient with hospitalist who agrees to admit.  Findings and plan of care discussed with supervising physician Dr. Doren Custard who is in agreement.    Final Clinical Impression(s) / ED Diagnoses Final diagnoses:  Cellulitis of right upper extremity  Insect bite of right hand, initial encounter    Rx / DC Orders ED Discharge Orders     None         Nestor Lewandowsky 12/10/21 1530    Godfrey Pick, MD 12/12/21 [phone]

## 2021-12-10 NOTE — ED Notes (Signed)
Requesting pain meds, will notify provider

## 2021-12-10 NOTE — ED Triage Notes (Addendum)
Pt states she was bit by something on Wednesday, right hand is swollen and with redness, redness started to move up the forearm. One spot started to drain.

## 2021-12-10 NOTE — Progress Notes (Signed)
Pharmacy Antibiotic Note  [name] is a 88 y.o. female admitted on 12/10/2021 with cellulitis.  Pharmacy has been consulted for vancomycin dosing.  Plan: Vancomycin 1750 mg IV x 1 dose. Vancomycin 1000 mg IV every 24 hours. Monitor labs, c/s, and vanco level as indicated.  Height: 5' 2"  (157.5 cm) Weight: 74.4 kg (164 lb) IBW/kg (Calculated) : 50.1  Temp (24hrs), Avg:97.9 F (36.6 C), Min:97.9 F (36.6 C), Max:97.9 F (36.6 C)  Recent Labs  Lab 12/10/21 1359  WBC 7.7  CREATININE 0.95  LATICACIDVEN 1.2    Estimated Creatinine Clearance: 40.1 mL/min (by C-G formula based on SCr of 0.95 mg/dL).    Allergies  Allergen Reactions   Cephalosporins Rash   Latex Rash    Breaks out then gets infected   Lactose Intolerance (Gi) Nausea And Vomiting   Lactulose Nausea And Vomiting   Cephalexin Rash   Levofloxacin Anxiety and Rash   Meperidine And Related Nausea And Vomiting    Needed Phenergan to combat nausea.   Omeprazole Rash    Antimicrobials this admission: Vanco 7/3 >>  Microbiology results: 7/3 BCx: pending   Thank you for allowing pharmacy to be a part of this patient's care.  Margot Ables, PharmD Clinical Pharmacist 12/10/2021 3:13 PM

## 2021-12-11 DIAGNOSIS — L0390 Cellulitis, unspecified: Secondary | ICD-10-CM | POA: Diagnosis present

## 2021-12-11 DIAGNOSIS — L03113 Cellulitis of right upper limb: Secondary | ICD-10-CM | POA: Diagnosis not present

## 2021-12-11 LAB — CBC
HCT: 32.9 % — ABNORMAL LOW (ref 36.0–46.0)
Hemoglobin: 10.7 g/dL — ABNORMAL LOW (ref 12.0–15.0)
MCH: 31 pg (ref 26.0–34.0)
MCHC: 32.5 g/dL (ref 30.0–36.0)
MCV: 95.4 fL (ref 80.0–100.0)
Platelets: 227 10*3/uL (ref 150–400)
RBC: 3.45 MIL/uL — ABNORMAL LOW (ref 3.87–5.11)
RDW: 13.9 % (ref 11.5–15.5)
WBC: 8.4 10*3/uL (ref 4.0–10.5)
nRBC: 0 % (ref 0.0–0.2)

## 2021-12-11 LAB — BASIC METABOLIC PANEL
Anion gap: 7 (ref 5–15)
BUN: 13 mg/dL (ref 8–23)
CO2: 27 mmol/L (ref 22–32)
Calcium: 8.4 mg/dL — ABNORMAL LOW (ref 8.9–10.3)
Chloride: 108 mmol/L (ref 98–111)
Creatinine, Ser: 0.9 mg/dL (ref 0.44–1.00)
GFR, Estimated: >60 mL/min (ref >60)
Glucose, Bld: 103 mg/dL — ABNORMAL HIGH (ref 70–99)
Potassium: 3.9 mmol/L (ref 3.5–5.1)
Sodium: 142 mmol/L (ref 135–145)

## 2021-12-11 MED ORDER — DOXYCYCLINE HYCLATE 100 MG PO CAPS: 100.0000 mg | ORAL_CAPSULE | Freq: Two times a day (BID) | ORAL | 0 refills | Status: AC

## 2021-12-11 NOTE — Care Management Obs Status (Signed)
[place] NOTIFICATION   Patient Details  Name: DENEENE TARVER MRN: [id number] Date of Birth: [birth year]   Medicare Observation Status Notification Given:  Yes    Boneta Lucks, RN 12/11/2021, 11:17 AM

## 2021-12-11 NOTE — TOC Progression Note (Signed)
  Transition of Care [facility]) Screening Note   Patient Details  Name: [name] Date of Birth: 1934/04/25   Transition of Care [facility]) CM/SW Contact:    Boneta Lucks, RN Phone Number: 12/11/2021, 8:22 AM    Transition of Care Department [facility]) has reviewed patient and no TOC needs have been identified at this time. We will continue to monitor patient advancement through interdisciplinary progression rounds. If new patient transition needs arise, please place a TOC consult.    Expected Discharge Plan: Home/Self Care Barriers to Discharge: Continued Medical Work up  Expected Discharge Plan and Services Expected Discharge Plan: Home/Self Care       Living arrangements for the past 2 months: [place]

## 2021-12-11 NOTE — Discharge Summary (Signed)
Physician Discharge Summary  [name] HYW:[id number] DOB: 03-Apr-1934 DOA: 12/10/2021  PCP: Leslie Andrea, MD  Admit date: 12/10/2021  Discharge date: 12/11/2021  Admitted From: Home  Disposition: Home  Recommendations for Outpatient Follow-up:  Follow up with PCP in 1-2 weeks Continue other home medications as prior Continue doxycycline for right arm cellulitis for the next 7 days Keep arm elevated at home and patient was counseled on this  Home Health: None  Equipment/Devices: None  Discharge Condition:Stable  CODE STATUS: Full  Diet recommendation: Heart Healthy  Brief/Interim Summary: [name] is a 88 y.o. female with medical history significant of hyperlipidemia, asthma hypothyroidism, anxiety, depression, and Parkinson's disease who presents with complaints of redness and swelling of right hand after insect bite 5 days ago.  She was admitted for cellulitis of the right hand in the setting of an insect bite.  She was started on IV vancomycin and has had less swelling and erythema to her right arm today.  She continues to have minimal pain, but is stable for discharge on oral antibiotics with doxycycline.  No other acute events noted throughout the course of the stay and she is in stable condition for discharge.  Discharge Diagnoses:  Principal Problem:   Cellulitis of right hand Active Problems:   Hypocalcemia   Normocytic anemia   Depression   Parkinson disease ([place])   Hypothyroidism  Principal discharge diagnosis: Cellulitis of right arm in the setting of insect bite.  Discharge Instructions  Discharge Instructions     Diet - low sodium heart healthy   Complete by: As directed    Increase activity slowly   Complete by: As directed       Allergies as of 12/11/2021       Reactions   Cephalosporins Rash   Latex Rash   Breaks out then gets infected   Lactose Intolerance (gi) Nausea And Vomiting   Lactulose Nausea And Vomiting   Cephalexin Rash    Levofloxacin Anxiety, Rash   Meperidine And Related Nausea And Vomiting   Needed Phenergan to combat nausea.   Omeprazole Rash        Medication List     TAKE these medications    acetaminophen 325 MG tablet Commonly known as: Tylenol Take 2 tablets (650 mg total) by mouth every 6 (six) hours as needed for moderate pain.   albuterol 108 (90 Base) MCG/ACT inhaler Commonly known as: VENTOLIN HFA Inhale 1-2 puffs into the lungs every 6 (six) hours as needed for wheezing or shortness of breath.   benzonatate 100 MG capsule Commonly known as: TESSALON Take 1-2 capsules (100-200 mg total) by mouth 3 (three) times daily as needed for cough.   Biotin 10 MG Caps Take 1 capsule by mouth daily.   bismuth subsalicylate 485 IO/27OJ suspension Commonly known as: PEPTO BISMOL Take 30 mLs by mouth as needed.   carbidopa-levodopa 25-100 MG tablet Commonly known as: SINEMET IR Take 1 tablet by mouth 4 (four) times daily. Must be seen for further refills. Please call [phone] to schedule.   cetirizine 10 MG tablet Commonly known as: ZYRTEC Take 10 mg by mouth daily.   cetirizine-pseudoephedrine 5-120 MG tablet Commonly known as: ZYRTEC-D Take 1 tablet by mouth daily. For sinus as needed   D3-1000 PO Take by mouth daily.   diphenhydrAMINE 25 MG tablet Commonly known as: BENADRYL Take 25 mg by mouth every 6 (six) hours as needed.   doxycycline 100 MG capsule Commonly known as: VIBRAMYCIN Take  1 capsule (100 mg total) by mouth 2 (two) times daily for 7 days.   escitalopram 10 MG tablet Commonly known as: LEXAPRO Take 10 mg by mouth daily.   famotidine 20 MG tablet Commonly known as: PEPCID Take one dose before evening meal each day, may take second dose if needed. No more than two per day.   GAS-X PO Take by mouth. As needed   ketoconazole 2 % cream Commonly known as: NIZORAL Apply 1 application topically daily.   LACTAID PO Take by mouth. As needed    levothyroxine 25 MCG tablet Commonly known as: SYNTHROID Take 25 mcg by mouth daily.   loperamide 2 MG capsule Commonly known as: IMODIUM Take 2-4 mg by mouth daily.   mometasone 50 MCG/ACT nasal spray Commonly known as: NASONEX Place 2 sprays into the nose 2 (two) times daily as needed. For allergies   montelukast 10 MG tablet Commonly known as: SINGULAIR Take 10 mg by mouth at bedtime.   mupirocin ointment 2 % Commonly known as: BACTROBAN Apply 1 application topically 3 (three) times daily. What changed: additional instructions   naproxen 500 MG tablet Commonly known as: NAPROSYN Take 500 mg by mouth 2 (two) times daily as needed.   potassium chloride 10 MEQ CR capsule Commonly known as: MICRO-K Take 20 mEq by mouth daily.   promethazine-dextromethorphan 6.25-15 MG/5ML syrup Commonly known as: PROMETHAZINE-DM Take 5 mLs by mouth at bedtime as needed for cough.   torsemide 10 MG tablet Commonly known as: DEMADEX Take 10 mg by mouth daily. 1/2 to 1 tab daily   ZINC PO Take by mouth daily.        Follow-up Information     Leslie Andrea, MD. Schedule an appointment as soon as possible for a visit in 1 week(s).   Specialty: Family Medicine Contact information: [place] [address] [place] [address] [phone]                Allergies  Allergen Reactions   Cephalosporins Rash   Latex Rash    Breaks out then gets infected   Lactose Intolerance (Gi) Nausea And Vomiting   Lactulose Nausea And Vomiting   Cephalexin Rash   Levofloxacin Anxiety and Rash   Meperidine And Related Nausea And Vomiting    Needed Phenergan to combat nausea.   Omeprazole Rash    Consultations: None   Procedures/Studies: DG Hand Complete Right  Result Date: 12/10/2021 CLINICAL DATA:  Insect bite to right hand on Wednesday with redness, pain and swelling of the entire right hand since the time. EXAM: RIGHT HAND - COMPLETE 3+ VIEW COMPARISON:  Right hand radiograph  02/17/2010. FINDINGS: There is no evidence of fracture or dislocation. Postoperative changes of trapeziectomy. 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 Diffuse soft tissue swelling throughout the visualized distal right forearm and right hand. No subcutaneous emphysema. IMPRESSION: 1. No acute osseous abnormality. 2. 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 3. Diffuse soft tissue swelling of the visualized distal right forearm and right hand. No subcutaneous emphysema. Electronically Signed   By: Ileana Roup M.D.   On: 12/10/2021 12:53     Discharge Exam: Vitals:   12/11/21 0134 12/11/21 0534  BP: (!) 148/64 137/64  Pulse: 74 71  Resp: 16 16  Temp: 98.7 F (37.1 C) 98.3 F (36.8 C)  SpO2: 98% 95%   Vitals:   12/10/21 1726 12/10/21 2122 12/11/21 0134 12/11/21 0534  BP: 139/75 131/74 (!) 148/64 137/64  Pulse: 69 73 74 71  Resp: 17 14 16 16   Temp: 98.2 F (36.8 C) 99 F (37.2 C) 98.7 F (37.1 C) 98.3 F (36.8 C)  TempSrc: Oral Oral Oral Oral  SpO2: 97% 98% 98% 95%  Weight:      Height:        General: Pt is alert, awake, not in acute distress Cardiovascular: RRR, S1/S2 +, no rubs, no gallops Respiratory: CTA bilaterally, no wheezing, no rhonchi Abdominal: Soft, NT, ND, bowel sounds + Extremities: no edema, no cyanosis    The results of significant diagnostics from this hospitalization (including imaging, microbiology, ancillary and laboratory) are listed below for reference.     Microbiology: Recent Results (from the past 240 hour(s))  Culture, blood (single)     Status: None (Preliminary result)   Collection Time: 12/10/21  1:59 PM   Specimen: BLOOD RIGHT ARM  Result Value Ref Range Status   Specimen Description BLOOD RIGHT ARM  Final   Special Requests   Final    BOTTLES DRAWN AEROBIC AND ANAEROBIC Blood Culture adequate volume   Culture   Final    NO GROWTH < 24 HOURS Performed at [facility], [address], [place], [place]  [address]    Report Status PENDING  Incomplete     Labs: BNP (last 3 results) No results for input(s): "BNP" in the last 8760 hours. Basic Metabolic Panel: Recent Labs  Lab 12/10/21 1359 12/11/21 0443  NA 140 142  K 3.6 3.9  CL 105 108  CO2 25 27  GLUCOSE 102* 103*  BUN 14 13  CREATININE 0.95 0.90  CALCIUM 8.4* 8.4*   Liver Function Tests: Recent Labs  Lab 12/10/21 1359  AST 27  ALT 9  ALKPHOS 84  BILITOT 0.3  PROT 6.6  ALBUMIN 3.5   No results for input(s): "LIPASE", "AMYLASE" in the last 168 hours. No results for input(s): "AMMONIA" in the last 168 hours. CBC: Recent Labs  Lab 12/10/21 1359 12/11/21 0443  WBC 7.7 8.4  NEUTROABS 4.4  --   HGB 11.5* 10.7*  HCT 36.0 32.9*  MCV 94.5 95.4  PLT 231 227   Cardiac Enzymes: No results for input(s): "CKTOTAL", "CKMB", "CKMBINDEX", "TROPONINI" in the last 168 hours. BNP: Invalid input(s): "POCBNP" CBG: No results for input(s): "GLUCAP" in the last 168 hours. D-Dimer No results for input(s): "DDIMER" in the last 72 hours. Hgb A1c No results for input(s): "HGBA1C" in the last 72 hours. Lipid Profile No results for input(s): "CHOL", "HDL", "LDLCALC", "TRIG", "CHOLHDL", "LDLDIRECT" in the last 72 hours. Thyroid function studies No results for input(s): "TSH", "T4TOTAL", "T3FREE", "THYROIDAB" in the last 72 hours.  Invalid input(s): "FREET3" Anemia work up No results for input(s): "VITAMINB12", "FOLATE", "FERRITIN", "TIBC", "IRON", "RETICCTPCT" in the last 72 hours. Urinalysis    Component Value Date/Time   COLORURINE YELLOW 12/01/2009 1830   APPEARANCEUR CLEAR 12/01/2009 1830   LABSPEC 1.015 12/01/2009 1830   PHURINE 8.0 12/01/2009 1830   GLUCOSEU NEGATIVE 12/01/2009 1830   HGBUR NEGATIVE 12/01/2009 1830   BILIRUBINUR negative 04/10/2021 1708   KETONESUR negative 04/10/2021 1708   KETONESUR NEGATIVE 12/01/2009 1830   PROTEINUR negative 04/10/2021 1708   PROTEINUR NEGATIVE 12/01/2009 1830   UROBILINOGEN  0.2 04/10/2021 1708   UROBILINOGEN 0.2 12/01/2009 1830   NITRITE Positive (A) 04/10/2021 1708   NITRITE NEGATIVE 12/01/2009 1830   LEUKOCYTESUR Trace (A) 04/10/2021 1708   Sepsis Labs Recent Labs  Lab 12/10/21 1359 12/11/21 0443  WBC 7.7 8.4   Microbiology Recent Results (  from the past 240 hour(s))  Culture, blood (single)     Status: None (Preliminary result)   Collection Time: 12/10/21  1:59 PM   Specimen: BLOOD RIGHT ARM  Result Value Ref Range Status   Specimen Description BLOOD RIGHT ARM  Final   Special Requests   Final    BOTTLES DRAWN AEROBIC AND ANAEROBIC Blood Culture adequate volume   Culture   Final    NO GROWTH < 24 HOURS Performed at [facility], [address], [place], [place] [address]    Report Status PENDING  Incomplete     Time coordinating discharge: 35 minutes  SIGNED:   Rodena Goldmann, DO [facility] 12/11/2021, 9:27 AM  If 7PM-7AM, please contact night-coverage [email or web address]

## 2021-12-11 NOTE — Progress Notes (Signed)
Nursing Progress Note   Patient sitting up in chair. Vitals stable. She is able to make needs known. Pain in her Right arm is evident by grimacing and voiced complaints. PRN pain med provided as ordered. Family is in room at this time.   Awaiting pending D/C. Code 44.   Vitals:   12/11/21 0134 12/11/21 0534  BP: (!) 148/64 137/64  Pulse: 74 71  Resp: 16 16  Temp: 98.7 F (37.1 C) 98.3 F (36.8 C)  SpO2: 98% 95%    Alfonse Alpers RN, BSN 12/11/2021 11:05 AM

## 2021-12-11 NOTE — Care Management CC44 (Signed)
Condition Code 44 Documentation Completed  Patient Details  Name: SOMAYA GRASSI MRN: [id number] Date of Birth: [birth year]   Condition Code 44 given:  Yes Patient signature on Condition Code 44 notice:  Yes Documentation of 2 MD's agreement:  Yes Code 44 added to claim:  Yes    Boneta Lucks, RN 12/11/2021, 11:17 AM

## 2021-12-13 LAB — CALCIUM, IONIZED: Calcium, Ionized, Serum: 4.9 mg/dL (ref 4.5–5.6)

## 2021-12-15 LAB — CULTURE, BLOOD (SINGLE)
Culture: NO GROWTH
Special Requests: ADEQUATE

## 2022-01-01 ENCOUNTER — Other Ambulatory Visit: Payer: Self-pay | Admitting: Gastroenterology

## 2022-01-01 ENCOUNTER — Telehealth: Payer: Self-pay

## 2022-01-01 DIAGNOSIS — K219 Gastro-esophageal reflux disease without esophagitis: Secondary | ICD-10-CM

## 2022-01-01 MED ORDER — FAMOTIDINE 20 MG PO TABS: ORAL_TABLET | ORAL | 5 refills | Status: AC

## 2022-01-01 NOTE — Telephone Encounter (Signed)
Refill request received for famotidine 20 mg tablets qty: 60 to be sent to [facility] Dr. Starleen Blue was 07/10/21 with Magda Paganini.

## 2022-01-01 NOTE — Telephone Encounter (Signed)
Rx sent 

## 2022-03-11 ENCOUNTER — Ambulatory Visit: Payer: Medicare Other | Admitting: Adult Health

## 2022-03-11 ENCOUNTER — Encounter: Payer: Self-pay | Admitting: Adult Health

## 2022-03-11 VITALS — BP 150/72 | HR 72 | Ht 60.0 in | Wt 164.0 lb

## 2022-03-11 DIAGNOSIS — M542 Cervicalgia: Secondary | ICD-10-CM | POA: Diagnosis not present

## 2022-03-11 DIAGNOSIS — G20A2 Parkinson's disease without dyskinesia, with fluctuations: Secondary | ICD-10-CM

## 2022-03-11 NOTE — Progress Notes (Signed)
PATIENT: [name] DOB: 1934-01-13  REASON FOR VISIT: follow up HISTORY FROM: patient  Chief Complaint  Patient presents with   Rm 19    Here alone for follow-up. She had a fall going up a step right after Christmas. She didn't have any broken bones but it took her awhile to get over it. She fell since then as well. She believes she is unsteady and balance is worse but doesn't feel any other symptoms have worsened. She lives alone and does everything for herself. She uses her cane frequently. She's had bronchitis twice this spring, COVID in March, insect bite in R hand which resulted in cellulitis. She has back trouble and it is painful.     HISTORY OF PRESENT ILLNESS: Today 03/11/22 :  [name] is an 88 year old year female with a parkinson disease. She returns today for follow-up.  She has not been seen since 2021. Has had several falls, fortunately has not had significant injuries. Uses a cane or walking stick when ambulating.  Does get chocked with food and liquids. Very sporadic.  Does not occur every time she eats. Tremor is mild in the upper extremities. Doesn't sleep well. Has trouble falling asleep but once a asleep will sleep all night.  Continues on Sinemet 4 times a day  09/06/19: [name] is an 88 year old female with a history of Parkinson's disease.  She returns today for follow-up.  She states that she was in physical therapy up until December.  She states that she was doing well. She states that she stopped therapy around Christmas due to increase in COVID-19 cases in [place].  She would like to restart therapy now.  She reports that she did have a recent fall on her back deck.  Reports that her tremor is stable.  Reports on occasion she may get choked when trying to swallow food particularly cornbread.  But this is not consistent.  She returns today for an evaluation.   HISTORY [name] is an 88 year old female with Parkinson's disease.  She returns  today for follow-up.  She states that she noticed over the summer that the heat affects her Parkinson's.  Therefore she tries to stay indoors.  She states that her biggest issue is staying asleep.  She states that some nights she does not go to bed till after midnight.  And even then she may wake up several times.  She states that she tried taking Xanax before bed but it makes her sleep a lot.  She states in the past she is also tried melatonin but took it right before bedtime.  She continues on Sinemet but reports that she typically takes it 3 times a day instead of 4.  Reports that there are times that her balance has been off.  She has reported some falls.  She states that she typically falls forward.  Fortunately she is not suffered any significant injuries.  She states that she has been trying to use her cane more.  Denies any trouble eating.  Reports that she does eat slower than before.  She returns today for evaluation.    REVIEW OF SYSTEMS: Out of a complete 14 system review of symptoms, the patient complains only of the following symptoms, and all other reviewed systems are negative.  See HPI  ALLERGIES: Allergies  Allergen Reactions   Cephalosporins Rash   Latex Rash    Breaks out then gets infected   Lactose Intolerance (Gi)  Nausea And Vomiting   Lactulose Nausea And Vomiting   Cephalexin Rash   Levofloxacin Anxiety and Rash   Meperidine And Related Nausea And Vomiting    Needed Phenergan to combat nausea.   Omeprazole Rash    HOME MEDICATIONS: Outpatient Medications Prior to Visit  Medication Sig Dispense Refill   acetaminophen (TYLENOL) 325 MG tablet Take 2 tablets (650 mg total) by mouth every 6 (six) hours as needed for moderate pain. 30 tablet 0   albuterol (VENTOLIN HFA) 108 (90 Base) MCG/ACT inhaler Inhale 1-2 puffs into the lungs every 6 (six) hours as needed for wheezing or shortness of breath. 18 g 0   Biotin 10 MG CAPS Take 1 capsule by mouth daily.       carbidopa-levodopa (SINEMET IR) 25-100 MG tablet Take 1 tablet by mouth 4 (four) times daily. Must be seen for further refills. Please call [phone] to schedule. 360 tablet 0   cetirizine (ZYRTEC) 10 MG tablet Take 10 mg by mouth daily.     diphenhydrAMINE (BENADRYL) 25 MG tablet Take 25 mg by mouth every 6 (six) hours as needed.     escitalopram (LEXAPRO) 10 MG tablet Take 10 mg by mouth daily.     famotidine (PEPCID) 20 MG tablet Take one dose before evening meal each day, may take second dose if needed. No more than two per day. 60 tablet 5   levothyroxine (SYNTHROID, LEVOTHROID) 25 MCG tablet Take 25 mcg by mouth daily.     montelukast (SINGULAIR) 10 MG tablet Take 10 mg by mouth at bedtime.       Multiple Vitamins-Minerals (ZINC PO) Take by mouth daily.     naproxen (NAPROSYN) 500 MG tablet Take 500 mg by mouth 2 (two) times daily as needed for mild pain.     potassium chloride (MICRO-K) 10 MEQ CR capsule Take 20 mEq by mouth daily.      torsemide (DEMADEX) 10 MG tablet Take 10 mg by mouth daily. 1/2 to 1 tab daily     benzonatate (TESSALON) 100 MG capsule Take 1-2 capsules (100-200 mg total) by mouth 3 (three) times daily as needed for cough. (Patient not taking: Reported on 12/11/2021) 60 capsule 0   Cholecalciferol (D3-1000 PO) Take 1 tablet by mouth daily. (Patient not taking: Reported on 03/11/2022)     ketoconazole (NIZORAL) 2 % cream Apply 1 application topically daily. (Patient not taking: Reported on 12/11/2021) 30 g 0   mupirocin ointment (BACTROBAN) 2 % Apply 1 application topically 3 (three) times daily. (Patient not taking: Reported on 12/11/2021) 30 g 0   promethazine-dextromethorphan (PROMETHAZINE-DM) 6.25-15 MG/5ML syrup Take 5 mLs by mouth at bedtime as needed for cough. (Patient not taking: Reported on 12/11/2021) 100 mL 0   No facility-administered medications prior to visit.    PAST MEDICAL HISTORY: Past Medical History:  Diagnosis Date   Ankle swelling    takes torsemide  prn   Anxiety    Arthritis    Asthma    allergy induced   Back pain    Complication of anesthesia    COVID 2023   Depression    GERD (gastroesophageal reflux disease)    HOH (hard of hearing)    Hypercholesterolemia    Hypothyroid    Parkinson disease    PONV (postoperative nausea and vomiting)    Seasonal allergies     PAST SURGICAL HISTORY: Past Surgical History:  Procedure Laterality Date   ABDOMINAL HYSTERECTOMY     CARPAL TUNNEL RELEASE Right  10/26/2015   Procedure: CARPAL TUNNEL RELEASE;  Surgeon: Daryll Brod, MD;  Location: [facility];  Service: Orthopedics;  Laterality: Right;   CARPOMETACARPEL SUSPENSION PLASTY Right 10/26/2015   Procedure: RIGHT HAND TRAPEZIECTOMY WITH THUMB METACARPEL SUSPENSION PLASTY;  Surgeon: Daryll Brod, MD;  Location: [facility];  Service: Orthopedics;  Laterality: Right;   CATARACT EXTRACTION W/PHACO  12/23/2011   Procedure: CATARACT EXTRACTION PHACO AND INTRAOCULAR LENS PLACEMENT (IOC);  Surgeon: Williams Che, MD;  Location: [facility];  Service: Ophthalmology;  Laterality: Right;  CDE=5.16   CHOLECYSTECTOMY     EYE SURGERY     cataract extraction of left eye-[facility]-2002   KNEE ARTHROSCOPY     right   NISSEN FUNDOPLICATION     [facility]   TRIGGER FINGER RELEASE Right 10/26/2015   Procedure: RELEASE A-1 PULLEY RIGHT RING FINGER;  Surgeon: Daryll Brod, MD;  Location: [facility];  Service: Orthopedics;  Laterality: Right;    FAMILY HISTORY: Family History  Problem Relation Age of Onset   Stroke Mother    COPD Father    Stroke Father    Colon cancer Neg Hx     SOCIAL HISTORY: Social History   Socioeconomic History   Marital status: Widowed    Spouse name: Not on file   Number of children: 2   Years of education: Not on file   Highest education level: Not on file  Occupational History    Comment: retired  Tobacco Use   Smoking status: Never   Smokeless tobacco: Never  Vaping Use   Vaping Use:  Never used  Substance and Sexual Activity   Alcohol use: Yes    Alcohol/week: 1.0 standard drink of alcohol    Types: 1 Glasses of wine per week    Comment: occ   Drug use: No   Sexual activity: Not Currently    Birth control/protection: Surgical  Other Topics Concern   Not on file  Social History Narrative   ** Merged History Encounter ** Patient lives at home alone and she is widowed.   Retired.    Education one year business course.   Right handed.   Caffeine two cup of coffee daily and 4 cups of sweet tea   Social Determinants of Health   Financial Resource Strain: Not on file  Food Insecurity: Not on file  Transportation Needs: Not on file  Physical Activity: Not on file  Stress: Not on file  Social Connections: Not on file  Intimate Partner Violence: Not on file      PHYSICAL EXAM  Vitals:   03/11/22 1333  BP: (!) 150/72  Pulse: 72  Weight: 164 lb (74.4 kg)  Height: 5' (1.524 m)   Body mass index is 32.03 kg/m.  Generalized: Well developed, in no acute distress   Neurological examination  Mentation: Alert oriented to time, place, history taking. Follows all commands speech and language fluent Cranial nerve II-XII: Pupils were equal round reactive to light. Extraocular movements were full, visual field were full on confrontational test.  Head turning and shoulder shrug  were normal and symmetric. Motor: The motor testing reveals 5 over 5 strength of all 4 extremities. Good symmetric motor tone is noted throughout.  Mild resting tremor in the upper extremities.  Right greater than left Sensory: Sensory testing is intact to soft touch on all 4 extremities. No evidence of extinction is noted.  Coordination: Cerebellar testing reveals good finger-nose-finger and heel-to-shin bilaterally.  Gait and station: Patient  uses a cane when ambulating.  3 steps with turns. good stride and arm swing.  Tandem gait not attempted. Reflexes: Deep tendon reflexes are symmetric  and normal bilaterally.   DIAGNOSTIC DATA (LABS, IMAGING, TESTING) - I reviewed patient records, labs, notes, testing and imaging myself where available.  Lab Results  Component Value Date   WBC 8.4 12/11/2021   HGB 10.7 (L) 12/11/2021   HCT 32.9 (L) 12/11/2021   MCV 95.4 12/11/2021   PLT 227 12/11/2021      Component Value Date/Time   NA 142 12/11/2021 0443   K 3.9 12/11/2021 0443   CL 108 12/11/2021 0443   CO2 27 12/11/2021 0443   GLUCOSE 103 (H) 12/11/2021 0443   BUN 13 12/11/2021 0443   CREATININE 0.90 12/11/2021 0443   CALCIUM 8.4 (L) 12/11/2021 0443   PROT 6.6 12/10/2021 1359   ALBUMIN 3.5 12/10/2021 1359   AST 27 12/10/2021 1359   ALT 9 12/10/2021 1359   ALKPHOS 84 12/10/2021 1359   BILITOT 0.3 12/10/2021 1359   GFRNONAA >60 12/11/2021 0443   GFRAA >60 10/24/2015 1038      ASSESSMENT AND PLAN 88 y.o. year old female  has a past medical history of Ankle swelling, Anxiety, Arthritis, Asthma, Back pain, Complication of anesthesia, COVID (2023), Depression, GERD (gastroesophageal reflux disease), HOH (hard of hearing), Hypercholesterolemia, Hypothyroid, Parkinson disease, PONV (postoperative nausea and vomiting), and Seasonal allergies. here with :  1.  Parkinson's disease  -Continue Sinemet 25-100 mg 4 times a day -Referral set for physical therapy for gait and balance training -Advised if symptoms worsen or she develops new symptoms she should let us know. -Follow-up in 6 months or sooner if needed    Ward Givens, MSN, NP-C 03/11/2022, 1:55 PM [facility] [address], [place], [place] [address] [phone]

## 2022-03-11 NOTE — Patient Instructions (Signed)
Your Plan:  Continue sinemet Referral to PT If your symptoms worsen or you develop new symptoms please let us know.    Thank you for coming to see [place] at [facility]. I hope we have been able to provide you high quality care today.  You may receive a patient satisfaction survey over the next few weeks. We would appreciate your feedback and comments so that we may continue to improve ourselves and the health of our patients.

## 2022-04-15 ENCOUNTER — Ambulatory Visit (HOSPITAL_COMMUNITY): Payer: Medicare Other | Attending: Family Medicine | Admitting: Physical Therapy

## 2022-04-15 ENCOUNTER — Encounter (HOSPITAL_COMMUNITY): Payer: Self-pay | Admitting: Physical Therapy

## 2022-04-15 DIAGNOSIS — R262 Difficulty in walking, not elsewhere classified: Secondary | ICD-10-CM | POA: Diagnosis present

## 2022-04-15 DIAGNOSIS — R2689 Other abnormalities of gait and mobility: Secondary | ICD-10-CM | POA: Diagnosis present

## 2022-04-15 DIAGNOSIS — M6281 Muscle weakness (generalized): Secondary | ICD-10-CM | POA: Insufficient documentation

## 2022-04-15 DIAGNOSIS — M542 Cervicalgia: Secondary | ICD-10-CM | POA: Diagnosis not present

## 2022-04-15 DIAGNOSIS — G20A2 Parkinson's disease without dyskinesia, with fluctuations: Secondary | ICD-10-CM | POA: Diagnosis not present

## 2022-04-15 DIAGNOSIS — R296 Repeated falls: Secondary | ICD-10-CM | POA: Diagnosis present

## 2022-04-15 NOTE — Therapy (Signed)
OUTPATIENT PHYSICAL THERAPY CERVICAL EVALUATION   Patient Name: BRENDY FICEK MRN: [id number] DOB:23-Oct-1933, 88 y.o., female Today's Date: 04/15/2022   PT End of Session - 04/15/22 1052     Visit Number 1    Number of Visits 12    Date for PT Re-Evaluation 07/22/22    Authorization Type [employer] Medicare    Authorization Time Period "no Auth, no VL"    Progress Note Due on Visit 10    PT Start Time 1116    PT Stop Time 1201    PT Time Calculation (min) 45 min    Activity Tolerance Patient tolerated treatment well    Behavior During Therapy WFL for tasks assessed/performed             Past Medical History:  Diagnosis Date   Ankle swelling    takes torsemide prn   Anxiety    Arthritis    Asthma    allergy induced   Back pain    Complication of anesthesia    COVID 2023   Depression    GERD (gastroesophageal reflux disease)    HOH (hard of hearing)    Hypercholesterolemia    Hypothyroid    Parkinson disease    PONV (postoperative nausea and vomiting)    Seasonal allergies    Past Surgical History:  Procedure Laterality Date   ABDOMINAL HYSTERECTOMY     CARPAL TUNNEL RELEASE Right 10/26/2015   Procedure: CARPAL TUNNEL RELEASE;  Surgeon: Daryll Brod, MD;  Location: [place];  Service: Orthopedics;  Laterality: Right;   CARPOMETACARPEL SUSPENSION PLASTY Right 10/26/2015   Procedure: RIGHT HAND TRAPEZIECTOMY WITH THUMB METACARPEL SUSPENSION PLASTY;  Surgeon: Daryll Brod, MD;  Location: [place] [facility];  Service: Orthopedics;  Laterality: Right;   CATARACT EXTRACTION W/PHACO  12/23/2011   Procedure: CATARACT EXTRACTION PHACO AND INTRAOCULAR LENS PLACEMENT (IOC);  Surgeon: Williams Che, MD;  Location: [facility];  Service: Ophthalmology;  Laterality: Right;  CDE=5.16   CHOLECYSTECTOMY     EYE SURGERY     cataract extraction of left eye-[facility]-2002   KNEE ARTHROSCOPY     right   NISSEN FUNDOPLICATION     [facility]   TRIGGER FINGER RELEASE Right 10/26/2015    Procedure: RELEASE A-1 PULLEY RIGHT RING FINGER;  Surgeon: Daryll Brod, MD;  Location: [place];  Service: Orthopedics;  Laterality: Right;   Patient Active Problem List   Diagnosis Date Noted   Cellulitis 12/11/2021   Cellulitis of right hand 12/10/2021   Hypocalcemia 12/10/2021   Normocytic anemia 12/10/2021   Depression 12/10/2021   Hypothyroidism 12/10/2021   Edema of left lower extremity 07/10/2021   GERD (gastroesophageal reflux disease)    History of Nissen fundoplication    IBS (irritable bowel syndrome) 02/05/2018   Diarrhea 06/16/2017   N&V (nausea and vomiting) 06/16/2017   Numbness 08/18/2015   Polyneuropathy 08/18/2015   Menopausal syndrome (hot flushes) 06/15/2015   Tremor, essential 06/15/2015   Gastroesophageal reflux disease with esophagitis 06/15/2015   Bilateral carpal tunnel syndrome 06/15/2015   Resting tremor 06/15/2015   Scoliosis of thoracic spine 11/05/2013   Spinal stenosis, unspecified region other than cervical 12/03/2012   Spondylosis of unspecified site without mention of myelopathy 12/03/2012   Parkinson disease ([place])    Back pain    Seasonal allergies     PCP: Leslie Andrea, MD  REFERRING PROVIDER: Ward Givens, NP  REFERRING DIAG: G20.A2 (ICD-10-CM) - Parkinson's disease without dyskinesia, with fluctuating manifestations M54.2 (ICD-10-CM) -  Neck pain  THERAPY DIAG:  Cervicalgia  Difficulty in walking, not elsewhere classified  Muscle weakness (generalized)  Other abnormalities of gait and mobility  Repeated falls  Rationale for Evaluation and Treatment: Rehabilitation  ONSET DATE: Neck pain several months ago. Falls have been on going, 2-3x per month. SUBJECTIVE:                                                                                                                                                                                                         SUBJECTIVE STATEMENT: Patient reports hx of  falls and difficult walking for years. Developed neck pain several months ago after waking up. Symptoms are not constant but fluctuate day to day. Uses SPC intermittently for walking, not in certain parts of the house. Pain radiates into Left shoulder from neck. Denies falls that injured neck. Denies constitutional symptoms but hx of incontinence.  PERTINENT HISTORY:  Parkinson's disease - 1970  PAIN:  Are you having pain? Yes: NPRS scale: 6/10 Pain location: neck, Lt shoulder Pain description: sharp Aggravating factors: unknown Relieving factors: cream, tylenol, heat  PRECAUTIONS: Fall  WEIGHT BEARING RESTRICTIONS: No  FALLS:  Has patient fallen in last 6 months? Yes. Number of falls 2-3 per month  LIVING ENVIRONMENT: Lives with: lives alone Lives in: House/apartment Stairs: Yes: External: 2 steps; on right going up and on left going up Has following equipment at home: Single point cane  OCCUPATION: retired  PLOF: Independent with community mobility with device  PATIENT GOALS: Improve neck ROM, improve strength, reduce pain, improve balance, reduced falls.  NEXT MD VISIT: approx 2 mo  OBJECTIVE:   DIAGNOSTIC FINDINGS:  None recently  PATIENT SURVEYS:  FOTO 39  COGNITION: Overall cognitive status: Within functional limits for tasks assessed  SENSATION: WFL  POSTURE: rounded shoulders, forward head, and increased thoracic kyphosis  PALPATION: TTP Lt upper trap and cervical paraspinals   CERVICAL ROM:   Active ROM A/PROM (deg) eval  Flexion 50  Extension 26 P!  Right lateral flexion 12 P!  Left lateral flexion 12  Right rotation 32  Left rotation 33   (Blank rows = not tested)  UPPER EXTREMITY ROM:  Active ROM Right eval Left eval  Shoulder flexion    Shoulder extension    Shoulder abduction    Shoulder adduction    Shoulder extension    Shoulder internal rotation    Shoulder external rotation    Elbow flexion    Elbow extension    Wrist  flexion    Wrist extension  Wrist ulnar deviation    Wrist radial deviation    Wrist pronation    Wrist supination     (Blank rows = not tested)  UPPER EXTREMITY MMT:  MMT Right eval Left eval  Shoulder flexion 5 5  Shoulder extension 5 5  4+ 4+ 5  Shoulder adduction    Shoulder extension    Shoulder internal rotation 5 5  Shoulder external rotation 4+ 5  Middle trapezius    Lower trapezius    Elbow flexion 5 5  Elbow extension 5 5  Wrist flexion 5 5  Wrist extension 5 4  Wrist ulnar deviation    Wrist radial deviation    Wrist pronation    Wrist supination    Grip strength     (Blank rows = not tested)  CERVICAL SPECIAL TESTS:  Spurling's test: Positive and Distraction test: Positive  FUNCTIONAL TESTS:  5 times sit to stand: 13.5 Berg Balance Scale: 43  BERG  1. SITTING TO STANDING  4 able to stand without using hands and stabilize independently  2. STANDING UNSUPPORTED  4 able to stand safely for 2 minutes  If a subject is able to stand 2 minutes unsupported, score full points for sitting unsupported. Proceed to item #4  3. SITTING WITH BACK UNSUPPORTED BUT FEET SUPPORTED ON FLOOR OR ON A STOOL 4 able to sit safely and securely for 2 minutes  4. STANDING TO SITTING 4 sits safely with minimal use of hands  5. TRANSFERS 3 able to transfer safely definite need of hands  6. STANDING UNSUPPORTED WITH EYES CLOSED  3 able to stand 10 seconds with supervision  7. STANDING UNSUPPORTED WITH FEET TOGETHER  3 able to place feet together independently and stand 1 minute with supervision  8. REACHING FORWARD WITH OUTSTRETCHED ARM WHILE STANDING  3 can reach forward 12 cm (5 inches)  9. PICK UP OBJECT FROM THE FLOOR FROM A STANDING POSITION  3 able to pick up slipper but needs supervision  10. TURNING TO LOOK BEHIND OVER LEFT AND RIGHT SHOULDERS WHILE STANDING  3 looks behind one side only other side shows less weight shift  11. TURN 360 DEGREES 4 able  to turn 360 degrees safely in 4 seconds or less  12. PLACE ALTERNATE FOOT ON STEP OR STOOL WHILE STANDING UNSUPPORTED  4 able to stand independently and safely and complete 8 steps in 20 seconds  13. STANDING UNSUPPORTED ONE FOOT IN FRONT 0 loses balance while stepping or standing  14. STANDING ON ONE LEG 1 tries to lift leg unable to hold 3 seconds but remains standing independently  TOTAL SCORE  43/56 History of falls and BBS < 51, or no history of falls and BBS < 42 is predictive of falls (Shumway-Cook, 1997         TODAY'S TREATMENT:  DATE: 04/15/22 Eval, 5x sit to stand, MMT, ROM HEP   PATIENT EDUCATION:  Education details: findings, modalities, HEP, symptom awareness, safety, POC, PT role Person educated: Patient Education method: Explanation and Handouts Education comprehension: verbalized understanding  HOME EXERCISE PROGRAM: Access Code: [email or web address] URL: [email or web address] Date: 04/15/2022 Prepared by: Candie Mile  Exercises - Seated Cervical Retraction  - 3 x daily - 7 x weekly - 2 sets - 10 reps - Seated Scapular Retraction  - 3 x daily - 7 x weekly - 2 sets - 10 reps - Seated Upper Trapezius Stretch  - 3 x daily - 7 x weekly - 2 sets - 30 sec hold  ASSESSMENT:  CLINICAL IMPRESSION: Patient is a 88 y.o. female who was seen today for physical therapy evaluation and treatment for neck pain, and falls. Patient presents with deficits including reduced strength, limited range of motion, decreased endurance, limited activity tolerance, abnormalities of gait, impaired balance, and pain, contributing to impaired functional mobility with ADLs and IADLs. Patient is currently restricted in ADLs as indicated by objective and subjective functional outcome measures, as well as reported history and objective measures taken during this  exam. Patient will benefit from skilled physical therapy intervention in order to improve function and reduce the impairments listed above.   OBJECTIVE IMPAIRMENTS: Abnormal gait, decreased activity tolerance, decreased balance, decreased knowledge of condition, decreased knowledge of use of DME, decreased mobility, difficulty walking, decreased ROM, decreased strength, hypomobility, increased fascial restrictions, impaired flexibility, postural dysfunction, and pain.   ACTIVITY LIMITATIONS: carrying, lifting, squatting, stairs, reach over head, and locomotion level  PARTICIPATION LIMITATIONS: cleaning, laundry, driving, shopping, and community activity  PERSONAL FACTORS: Age and Time since onset of injury/illness/exacerbation are also affecting patient's functional outcome.   REHAB POTENTIAL: Excellent  CLINICAL DECISION MAKING: Stable/uncomplicated  EVALUATION COMPLEXITY: Low   GOALS: Goals reviewed with patient? Yes  SHORT TERM GOALS: Target date: 05/06/22  Patient will be independent with initial HEP and self-management strategies to improve functional outcomes Baseline: Initiated Goal status: INITIAL    LONG TERM GOALS: Target date: 05/27/22  Patient will be independent with advanced HEP and self-management strategies to improve functional outcomes Baseline:  Goal status: INITIAL  2.  Patient will improve FOTO score to predicted value of 51 to indicate improvement in functional outcomes Baseline: 39 Goal status: INITIAL  3.  Patient will demo improved combined cervical rotation by 10 degrees total  in order to improve ability to scan environment for safety and while driving. Baseline: see above Goal status: INITIAL  4. Patient will report a decrease in neck pain to no more than 3/10 for improved quality of life and ability to perform UE ADLs  Baseline: 6/10 Goal status: INITIAL     5. Patient will improve BERG balance score >to 51 points or greater to show low fall  risk based on population that has hx of falls.    Baseline 43/56    Goal status: INITIAL  PLAN:  PT FREQUENCY: 2x/week  PT DURATION: 6 weeks  PLANNED INTERVENTIONS: Therapeutic exercises, Therapeutic activity, Neuromuscular re-education, Balance training, Gait training, Patient/Family education, Self Care, Joint mobilization, Joint manipulation, Stair training, DME instructions, Dry Needling, Electrical stimulation, Spinal manipulation, Spinal mobilization, Cryotherapy, Moist heat, Taping, Traction, Ultrasound, Biofeedback, Ionotophoresis 59m/ml Dexamethasone, Manual therapy, and Re-evaluation  PLAN FOR NEXT SESSION: Cervical ROM, upper quarter strengthening and postural training. Balance training, gait training, fall risk reduction.  LCandie Mile PT, DPT Physical Therapist [facility] [place]  04/15/2022, 1:01 PM

## 2022-04-20 ENCOUNTER — Ambulatory Visit
Admission: EM | Admit: 2022-04-20 | Discharge: 2022-04-20 | Disposition: A | Discharge status: Home / Self Care | Payer: Medicare Other | Attending: Nurse Practitioner | Admitting: Nurse Practitioner

## 2022-04-20 ENCOUNTER — Ambulatory Visit (INDEPENDENT_AMBULATORY_CARE_PROVIDER_SITE_OTHER): Payer: Medicare Other

## 2022-04-20 DIAGNOSIS — R059 Cough, unspecified: Secondary | ICD-10-CM | POA: Diagnosis not present

## 2022-04-20 DIAGNOSIS — J069 Acute upper respiratory infection, unspecified: Secondary | ICD-10-CM | POA: Diagnosis not present

## 2022-04-20 DIAGNOSIS — R062 Wheezing: Secondary | ICD-10-CM | POA: Diagnosis not present

## 2022-04-20 MED ORDER — AZITHROMYCIN 250 MG PO TABS: 250.0000 mg | ORAL_TABLET | Freq: Every day | ORAL | 0 refills | Status: DC

## 2022-04-20 MED ORDER — PREDNISONE 20 MG PO TABS: 40.0000 mg | ORAL_TABLET | Freq: Every day | ORAL | 0 refills | Status: AC

## 2022-04-20 NOTE — Discharge Instructions (Addendum)
The chest x-ray does not show any signs of pneumonia. Take medication as prescribed.  I am treating you with an antibiotic for your persistent symptoms. Increase fluids and allow for plenty of rest. Continue use of your albuterol inhaler and nebulizer as needed for cough, wheezing, or shortness of breath. If your symptoms do not improve after this treatment, please follow-up with your primary care physician for further evaluation. Go to the emergency department immediately if you experience worsening shortness of breath, difficulty breathing, or other concerns. Follow-up as needed.

## 2022-04-20 NOTE — ED Triage Notes (Signed)
Pt states cough and congestion for the past two weeks states she was using her inhaler and nebulizer and was feeling better but today she felt worse. Reports green sputum when she coughs.

## 2022-04-20 NOTE — ED Provider Notes (Signed)
[facility]    CSN: [id number] Arrival date & time: 04/20/22  1229      History   Chief Complaint Chief Complaint  Patient presents with   Cough    HPI [name] is a 88 y.o. female.   The history is provided by the patient.   Patient presents for complaints of a 2-week history of cough with wheezing and shortness of breath.  She states that she has an underlying history of seasonal allergies and asthma.  She states that her symptoms started when her allergies flared.  She states since that time, she has developed a productive cough.  She also complains of bilateral ear fullness and pressure and nasal congestion.  She states that she has been using her allergy medications to include Zyrtec and Flonase with minimal relief.  She also states that she has been using her albuterol nebulizer treatments and using her albuterol inhaler.  She denies any known sick contacts.  Past Medical History:  Diagnosis Date   Ankle swelling    takes torsemide prn   Anxiety    Arthritis    Asthma    allergy induced   Back pain    Complication of anesthesia    COVID 2023   Depression    GERD (gastroesophageal reflux disease)    HOH (hard of hearing)    Hypercholesterolemia    Hypothyroid    Parkinson disease    PONV (postoperative nausea and vomiting)    Seasonal allergies     Patient Active Problem List   Diagnosis Date Noted   Cellulitis 12/11/2021   Cellulitis of right hand 12/10/2021   Hypocalcemia 12/10/2021   Normocytic anemia 12/10/2021   Depression 12/10/2021   Hypothyroidism 12/10/2021   Edema of left lower extremity 07/10/2021   GERD (gastroesophageal reflux disease)    History of Nissen fundoplication    IBS (irritable bowel syndrome) 02/05/2018   Diarrhea 06/16/2017   N&V (nausea and vomiting) 06/16/2017   Numbness 08/18/2015   Polyneuropathy 08/18/2015   Menopausal syndrome (hot flushes) 06/15/2015   Tremor, essential 06/15/2015   Gastroesophageal  reflux disease with esophagitis 06/15/2015   Bilateral carpal tunnel syndrome 06/15/2015   Resting tremor 06/15/2015   Scoliosis of thoracic spine 11/05/2013   Spinal stenosis, unspecified region other than cervical 12/03/2012   Spondylosis of unspecified site without mention of myelopathy 12/03/2012   Parkinson disease ([place])    Back pain    Seasonal allergies     Past Surgical History:  Procedure Laterality Date   ABDOMINAL HYSTERECTOMY     CARPAL TUNNEL RELEASE Right 10/26/2015   Procedure: CARPAL TUNNEL RELEASE;  Surgeon: Daryll Brod, MD;  Location: [place];  Service: Orthopedics;  Laterality: Right;   CARPOMETACARPEL SUSPENSION PLASTY Right 10/26/2015   Procedure: RIGHT HAND TRAPEZIECTOMY WITH THUMB METACARPEL SUSPENSION PLASTY;  Surgeon: Daryll Brod, MD;  Location: [place];  Service: Orthopedics;  Laterality: Right;   CATARACT EXTRACTION W/PHACO  12/23/2011   Procedure: CATARACT EXTRACTION PHACO AND INTRAOCULAR LENS PLACEMENT (IOC);  Surgeon: Williams Che, MD;  Location: [facility];  Service: Ophthalmology;  Laterality: Right;  CDE=5.16   CHOLECYSTECTOMY     EYE SURGERY     cataract extraction of left eye-[facility]-2002   KNEE ARTHROSCOPY     right   NISSEN FUNDOPLICATION     [facility]   TRIGGER FINGER RELEASE Right 10/26/2015   Procedure: RELEASE A-1 PULLEY RIGHT RING FINGER;  Surgeon: Daryll Brod, MD;  Location:  [place];  Service: Orthopedics;  Laterality: Right;    OB History   No obstetric history on file.      Home Medications    Prior to Admission medications   Medication Sig Start Date End Date Taking? Authorizing Provider  azithromycin (ZITHROMAX) 250 MG tablet Take 1 tablet (250 mg total) by mouth daily. Take first 2 tablets together, then 1 every day until finished. 04/20/22  Yes Taaliyah Delpriore-Warren, Alda Lea, NP  predniSONE (DELTASONE) 20 MG tablet Take 2 tablets (40 mg total) by mouth daily with breakfast for 5 days. 04/20/22  04/25/22 Yes Shaunita Seney-Warren, Alda Lea, NP  acetaminophen (TYLENOL) 325 MG tablet Take 2 tablets (650 mg total) by mouth every 6 (six) hours as needed for moderate pain. 06/11/21   Jaynee Eagles, PA-C  albuterol (VENTOLIN HFA) 108 (90 Base) MCG/ACT inhaler Inhale 1-2 puffs into the lungs every 6 (six) hours as needed for wheezing or shortness of breath. 07/28/21   Jaynee Eagles, PA-C  benzonatate (TESSALON) 100 MG capsule Take 1-2 capsules (100-200 mg total) by mouth 3 (three) times daily as needed for cough. Patient not taking: Reported on 12/11/2021 08/06/21   Jaynee Eagles, PA-C  Biotin 10 MG CAPS Take 1 capsule by mouth daily.     [provider]  carbidopa-levodopa (SINEMET IR) 25-100 MG tablet Take 1 tablet by mouth 4 (four) times daily. Must be seen for further refills. Please call [phone] to schedule. 07/24/20   Ward Givens, NP  cetirizine (ZYRTEC) 10 MG tablet Take 10 mg by mouth daily.    [provider]  Cholecalciferol (D3-1000 PO) Take 1 tablet by mouth daily. Patient not taking: Reported on 03/11/2022    [provider]  diphenhydrAMINE (BENADRYL) 25 MG tablet Take 25 mg by mouth every 6 (six) hours as needed.    [provider]  escitalopram (LEXAPRO) 10 MG tablet Take 10 mg by mouth daily. 11/26/21   [provider]  famotidine (PEPCID) 20 MG tablet Take one dose before evening meal each day, may take second dose if needed. No more than two per day. 01/01/22   Erenest Rasher, PA-C  ketoconazole (NIZORAL) 2 % cream Apply 1 application topically daily. Patient not taking: Reported on 12/11/2021 06/11/21   Jaynee Eagles, PA-C  levothyroxine (SYNTHROID, LEVOTHROID) 25 MCG tablet Take 25 mcg by mouth daily. 03/09/14   [provider]  montelukast (SINGULAIR) 10 MG tablet Take 10 mg by mouth at bedtime.      [provider]  Multiple Vitamins-Minerals (ZINC PO) Take by mouth daily.    [provider]  mupirocin ointment  (BACTROBAN) 2 % Apply 1 application topically 3 (three) times daily. Patient not taking: Reported on 12/11/2021 06/11/21   Jaynee Eagles, PA-C  naproxen (NAPROSYN) 500 MG tablet Take 500 mg by mouth 2 (two) times daily as needed for mild pain.    [provider]  potassium chloride (MICRO-K) 10 MEQ CR capsule Take 20 mEq by mouth daily.  08/13/13   [provider]  promethazine-dextromethorphan (PROMETHAZINE-DM) 6.25-15 MG/5ML syrup Take 5 mLs by mouth at bedtime as needed for cough. Patient not taking: Reported on 12/11/2021 08/06/21   Jaynee Eagles, PA-C  torsemide (DEMADEX) 10 MG tablet Take 10 mg by mouth daily. 1/2 to 1 tab daily    [provider]    Family History Family History  Problem Relation Age of Onset   Stroke Mother    COPD Father    Stroke Father  Colon cancer Neg Hx     Social History Social History   Tobacco Use   Smoking status: Never   Smokeless tobacco: Never  Vaping Use   Vaping Use: Never used  Substance Use Topics   Alcohol use: Yes    Alcohol/week: 1.0 standard drink of alcohol    Types: 1 Glasses of wine per week    Comment: occ   Drug use: No     Allergies   Cephalosporins, Latex, Lactose intolerance (gi), Lactulose, Cephalexin, Levofloxacin, Meperidine and related, and Omeprazole   Review of Systems Review of Systems Per HPI  Physical Exam Triage Vital Signs ED Triage Vitals  Enc Vitals Group     BP 04/20/22 1417 (!) 150/80     Pulse Rate 04/20/22 1417 78     Resp 04/20/22 1417 16     Temp 04/20/22 1417 97.7 F (36.5 C)     Temp Source 04/20/22 1417 Oral     SpO2 04/20/22 1417 95 %     Weight --      Height --      Head Circumference --      Peak Flow --      Pain Score 04/20/22 1418 0     Pain Loc --      Pain Edu? --      Excl. in [place]? --    No data found.  Updated Vital Signs BP (!) 150/80 (BP Location: Right Arm)   Pulse 78   Temp 98.2 F (36.8 C) (Oral)   Resp 16   SpO2 95%   Visual Acuity Right  Eye Distance:   Left Eye Distance:   Bilateral Distance:    Right Eye Near:   Left Eye Near:    Bilateral Near:     Physical Exam Vitals and nursing note reviewed.  Constitutional:      General: She is not in acute distress.    Appearance: Normal appearance.  HENT:     Head: Normocephalic.     Right Ear: Tympanic membrane, ear canal and external ear normal.     Left Ear: Tympanic membrane, ear canal and external ear normal.     Nose: Congestion present. No rhinorrhea.     Right Turbinates: Enlarged and swollen.     Left Turbinates: Enlarged and swollen.     Right Sinus: No maxillary sinus tenderness or frontal sinus tenderness.     Left Sinus: No maxillary sinus tenderness or frontal sinus tenderness.     Mouth/Throat:     Lips: Pink.     Mouth: Mucous membranes are moist.     Pharynx: Uvula midline. Posterior oropharyngeal erythema present. No pharyngeal swelling.     Tonsils: No tonsillar exudate.  Eyes:     Extraocular Movements: Extraocular movements intact.     Conjunctiva/sclera: Conjunctivae normal.     Pupils: Pupils are equal, round, and reactive to light.  Cardiovascular:     Rate and Rhythm: Normal rate and regular rhythm.     Pulses: Normal pulses.     Heart sounds: Normal heart sounds.  Pulmonary:     Effort: Pulmonary effort is normal. No respiratory distress.     Breath sounds: Normal breath sounds. No wheezing or rales.  Abdominal:     General: Bowel sounds are normal.     Palpations: Abdomen is soft.     Tenderness: There is no abdominal tenderness.  Musculoskeletal:     Cervical back: Normal range of motion.  Lymphadenopathy:  Cervical: No cervical adenopathy.  Skin:    General: Skin is warm and dry.  Neurological:     General: No focal deficit present.     Mental Status: She is alert and oriented to person, place, and time.  Psychiatric:        Mood and Affect: Mood normal.        Behavior: Behavior normal.      UC Treatments / Results   Labs (all labs ordered are listed, but only abnormal results are displayed) Labs Reviewed - No data to display  EKG   Radiology DG Chest 2 View  Result Date: 04/20/2022 CLINICAL DATA:  Cough and wheezing for 2 weeks EXAM: CHEST - 2 VIEW COMPARISON:  08/16/2021 FINDINGS: Frontal and lateral views of the chest demonstrate a stable cardiac silhouette. The lungs are hyperinflated with mild diffuse interstitial prominence compatible with emphysema. No airspace disease, effusion, or pneumothorax. No acute bony abnormalities. IMPRESSION: 1. Stable emphysema.  No acute process. Electronically Signed   By: Randa Ngo M.D.   On: 04/20/2022 15:09    Procedures Procedures (including critical care time)  Medications Ordered in UC Medications - No data to display  Initial Impression / Assessment and Plan / UC Course  I have reviewed the triage vital signs and the nursing notes.  Pertinent labs & imaging results that were available during my care of the patient were reviewed by me and considered in my medical decision making (see chart for details).  Patient presents for upper respiratory symptoms that have been present for the past 2 weeks.  On exam, her vital signs are stable, although she is hypertensive, she is in no acute distress.  Lung sounds are clear on exam.  Chest x-ray also confirmed no pneumonia, but does show stable emphysema.  Given the patient's duration of symptoms and her age, will start patient on azithromycin 250 mg and prednisone 40 mg daily for control of her emphysema.  Patient advised to continue use of her albuterol nebulizer treatment and albuterol inhaler.  Supportive care recommendations were provided to the patient along with strict indications of when to go to the emergency department.  Patient verbalizes understanding.  All questions were answered.  Patient is stable for discharge. Final Clinical Impressions(s) / UC Diagnoses   Final diagnoses:  Acute upper  respiratory infection     Discharge Instructions      The chest x-ray does not show any signs of pneumonia. Take medication as prescribed.  I am treating you with an antibiotic for your persistent symptoms. Increase fluids and allow for plenty of rest. Continue use of your albuterol inhaler and nebulizer as needed for cough, wheezing, or shortness of breath. If your symptoms do not improve after this treatment, please follow-up with your primary care physician for further evaluation. Go to the emergency department immediately if you experience worsening shortness of breath, difficulty breathing, or other concerns. Follow-up as needed.     ED Prescriptions     Medication Sig Dispense Auth. Provider   predniSONE (DELTASONE) 20 MG tablet Take 2 tablets (40 mg total) by mouth daily with breakfast for 5 days. 10 tablet Davida Falconi-Warren, Alda Lea, NP   azithromycin (ZITHROMAX) 250 MG tablet Take 1 tablet (250 mg total) by mouth daily. Take first 2 tablets together, then 1 every day until finished. 6 tablet Adlai Sinning-Warren, Alda Lea, NP      PDMP not reviewed this encounter.   Tish Men, NP 04/20/22 [phone]

## 2022-04-22 ENCOUNTER — Telehealth: Payer: Self-pay | Admitting: Adult Health

## 2022-04-22 MED ORDER — CARBIDOPA-LEVODOPA 25-100 MG PO TABS: 1.0000 | ORAL_TABLET | Freq: Four times a day (QID) | ORAL | 1 refills | Status: DC

## 2022-04-22 NOTE — Telephone Encounter (Signed)
Pt called needing to discuss her carbidopa-levodopa (SINEMET IR) 25-100 MG tablet with the RN. Please advise.

## 2022-04-22 NOTE — Telephone Encounter (Signed)
Spoke with patient. She states [facility] mail order pharmacy stated she needs to request a new Rx for Sinemet. Pt confirmed she is taking sinemet 25-200 mg 1 tablet QID. Rx sent to pharmacy. Received a receipt of confirmation.

## 2022-04-24 ENCOUNTER — Encounter (HOSPITAL_COMMUNITY): Payer: Medicare Other

## 2022-04-26 ENCOUNTER — Ambulatory Visit (HOSPITAL_COMMUNITY): Payer: Medicare Other | Admitting: Physical Therapy

## 2022-04-26 ENCOUNTER — Encounter (HOSPITAL_COMMUNITY): Payer: Self-pay | Admitting: Physical Therapy

## 2022-04-26 DIAGNOSIS — M542 Cervicalgia: Secondary | ICD-10-CM | POA: Diagnosis not present

## 2022-04-26 DIAGNOSIS — R262 Difficulty in walking, not elsewhere classified: Secondary | ICD-10-CM

## 2022-04-26 DIAGNOSIS — M6281 Muscle weakness (generalized): Secondary | ICD-10-CM

## 2022-04-26 DIAGNOSIS — R2689 Other abnormalities of gait and mobility: Secondary | ICD-10-CM

## 2022-04-26 NOTE — Therapy (Signed)
OUTPATIENT PHYSICAL THERAPY CERVICAL EVALUATION   Patient Name: [name] MRN: [id number] DOB:[birth year], 88 y.o., female Today's Date: 04/26/2022   PT End of Session - 04/26/22 1605     Visit Number 2    Number of Visits 12    Date for PT Re-Evaluation 07/22/22    Authorization Type [employer] Medicare    Authorization Time Period "no Auth, no VL"    Progress Note Due on Visit 10    PT Start Time 1603    PT Stop Time 1645    PT Time Calculation (min) 42 min    Activity Tolerance Patient tolerated treatment well    Behavior During Therapy WFL for tasks assessed/performed             Past Medical History:  Diagnosis Date   Ankle swelling    takes torsemide prn   Anxiety    Arthritis    Asthma    allergy induced   Back pain    Complication of anesthesia    COVID 2023   Depression    GERD (gastroesophageal reflux disease)    HOH (hard of hearing)    Hypercholesterolemia    Hypothyroid    Parkinson disease    PONV (postoperative nausea and vomiting)    Seasonal allergies    Past Surgical History:  Procedure Laterality Date   ABDOMINAL HYSTERECTOMY     CARPAL TUNNEL RELEASE Right 10/26/2015   Procedure: CARPAL TUNNEL RELEASE;  Surgeon: Daryll Brod, MD;  Location: [facility];  Service: Orthopedics;  Laterality: Right;   CARPOMETACARPEL SUSPENSION PLASTY Right 10/26/2015   Procedure: RIGHT HAND TRAPEZIECTOMY WITH THUMB METACARPEL SUSPENSION PLASTY;  Surgeon: Daryll Brod, MD;  Location: [place] [facility];  Service: Orthopedics;  Laterality: Right;   CATARACT EXTRACTION W/PHACO  12/23/2011   Procedure: CATARACT EXTRACTION PHACO AND INTRAOCULAR LENS PLACEMENT (IOC);  Surgeon: Williams Che, MD;  Location: [facility];  Service: Ophthalmology;  Laterality: Right;  CDE=5.16   CHOLECYSTECTOMY     EYE SURGERY     cataract extraction of left eye-[facility]-2002   KNEE ARTHROSCOPY     right   NISSEN FUNDOPLICATION     [facility]   TRIGGER FINGER RELEASE Right 10/26/2015    Procedure: RELEASE A-1 PULLEY RIGHT RING FINGER;  Surgeon: Daryll Brod, MD;  Location: [place];  Service: Orthopedics;  Laterality: Right;   Patient Active Problem List   Diagnosis Date Noted   Cellulitis 12/11/2021   Cellulitis of right hand 12/10/2021   Hypocalcemia 12/10/2021   Normocytic anemia 12/10/2021   Depression 12/10/2021   Hypothyroidism 12/10/2021   Edema of left lower extremity 07/10/2021   GERD (gastroesophageal reflux disease)    History of Nissen fundoplication    IBS (irritable bowel syndrome) 02/05/2018   Diarrhea 06/16/2017   N&V (nausea and vomiting) 06/16/2017   Numbness 08/18/2015   Polyneuropathy 08/18/2015   Menopausal syndrome (hot flushes) 06/15/2015   Tremor, essential 06/15/2015   Gastroesophageal reflux disease with esophagitis 06/15/2015   Bilateral carpal tunnel syndrome 06/15/2015   Resting tremor 06/15/2015   Scoliosis of thoracic spine 11/05/2013   Spinal stenosis, unspecified region other than cervical 12/03/2012   Spondylosis of unspecified site without mention of myelopathy 12/03/2012   Parkinson disease ([place])    Back pain    Seasonal allergies     PCP: Leslie Andrea, MD  REFERRING PROVIDER: Ward Givens, NP  REFERRING DIAG: G20.A2 (ICD-10-CM) - Parkinson's disease without dyskinesia, with fluctuating manifestations M54.2 (ICD-10-CM) -  Neck pain  THERAPY DIAG:  Cervicalgia  Difficulty in walking, not elsewhere classified  Muscle weakness (generalized)  Other abnormalities of gait and mobility  Rationale for Evaluation and Treatment: Rehabilitation  ONSET DATE: Neck pain several months ago. Falls have been on going, 2-3x per month. SUBJECTIVE:                                                                                                                                                                                                         SUBJECTIVE STATEMENT: Denies any falls, feels comfortable with  HEP so far but has a few questions, reports HEP was helpful for symptoms.  PERTINENT HISTORY:  Parkinson's disease - 1970  PAIN:  Are you having pain? Yes: NPRS scale: 4/10 Pain location: neck, Lt shoulder Pain description: sharp Aggravating factors: unknown Relieving factors: cream, tylenol, heat  PRECAUTIONS: Fall  WEIGHT BEARING RESTRICTIONS: No  FALLS:  Has patient fallen in last 6 months? Yes. Number of falls 2-3 per month  LIVING ENVIRONMENT: Lives with: lives alone Lives in: House/apartment Stairs: Yes: External: 2 steps; on right going up and on left going up Has following equipment at home: Single point cane  OCCUPATION: retired  PLOF: Independent with community mobility with device  PATIENT GOALS: Improve neck ROM, improve strength, reduce pain, improve balance, reduced falls.  NEXT MD VISIT: approx 2 mo  OBJECTIVE:   DIAGNOSTIC FINDINGS:  None recently  PATIENT SURVEYS:  FOTO 39  COGNITION: Overall cognitive status: Within functional limits for tasks assessed  SENSATION: WFL  POSTURE: rounded shoulders, forward head, and increased thoracic kyphosis  PALPATION: TTP Lt upper trap and cervical paraspinals   CERVICAL ROM:   Active ROM A/PROM (deg) eval  Flexion 50  Extension 26 P!  Right lateral flexion 12 P!  Left lateral flexion 12  Right rotation 32  Left rotation 33   (Blank rows = not tested)  UPPER EXTREMITY ROM:  Active ROM Right eval Left eval  Shoulder flexion    Shoulder extension    Shoulder abduction    Shoulder adduction    Shoulder extension    Shoulder internal rotation    Shoulder external rotation    Elbow flexion    Elbow extension    Wrist flexion    Wrist extension    Wrist ulnar deviation    Wrist radial deviation    Wrist pronation    Wrist supination     (Blank rows = not tested)  UPPER EXTREMITY MMT:  MMT Right eval Left eval  Shoulder flexion 5 5  Shoulder extension 5 5  4+ 4+ 5  Shoulder  adduction    Shoulder extension    Shoulder internal rotation 5 5  Shoulder external rotation 4+ 5  Middle trapezius    Lower trapezius    Elbow flexion 5 5  Elbow extension 5 5  Wrist flexion 5 5  Wrist extension 5 4  Wrist ulnar deviation    Wrist radial deviation    Wrist pronation    Wrist supination    Grip strength     (Blank rows = not tested)  CERVICAL SPECIAL TESTS:  Spurling's test: Positive and Distraction test: Positive  FUNCTIONAL TESTS:  5 times sit to stand: 13.5 Berg Balance Scale: 43  BERG  1. SITTING TO STANDING  4 able to stand without using hands and stabilize independently  2. STANDING UNSUPPORTED  4 able to stand safely for 2 minutes  If a subject is able to stand 2 minutes unsupported, score full points for sitting unsupported. Proceed to item #4  3. SITTING WITH BACK UNSUPPORTED BUT FEET SUPPORTED ON FLOOR OR ON A STOOL 4 able to sit safely and securely for 2 minutes  4. STANDING TO SITTING 4 sits safely with minimal use of hands  5. TRANSFERS 3 able to transfer safely definite need of hands  6. STANDING UNSUPPORTED WITH EYES CLOSED  3 able to stand 10 seconds with supervision  7. STANDING UNSUPPORTED WITH FEET TOGETHER  3 able to place feet together independently and stand 1 minute with supervision  8. REACHING FORWARD WITH OUTSTRETCHED ARM WHILE STANDING  3 can reach forward 12 cm (5 inches)  9. PICK UP OBJECT FROM THE FLOOR FROM A STANDING POSITION  3 able to pick up slipper but needs supervision  10. TURNING TO LOOK BEHIND OVER LEFT AND RIGHT SHOULDERS WHILE STANDING  3 looks behind one side only other side shows less weight shift  11. TURN 360 DEGREES 4 able to turn 360 degrees safely in 4 seconds or less  12. PLACE ALTERNATE FOOT ON STEP OR STOOL WHILE STANDING UNSUPPORTED  4 able to stand independently and safely and complete 8 steps in 20 seconds  13. STANDING UNSUPPORTED ONE FOOT IN FRONT 0 loses balance while stepping  or standing  14. STANDING ON ONE LEG 1 tries to lift leg unable to hold 3 seconds but remains standing independently  TOTAL SCORE  43/56 History of falls and BBS < 51, or no history of falls and BBS < 42 is predictive of falls ([place]umway-Cook, 1997         TODAY'S TREATMENT:                                                                                                                              DATE:  04/26/22 Reviewed HEP and goals HEP updated with thoracic extension x10 Chin tuck + lateral flexion x10 Chin tuck + rotation x10 RTB bil shoulder ER seated 2x10 RTB seated  row 2x10 RTB seated shoulder extension 2x10  Standing Heel/toe rock with back to wall for safety x10 Standing 3 way hip vectors x5 ea   04/15/22 Eval, 5x sit to stand, MMT, ROM HEP   PATIENT EDUCATION:  Education details: findings, modalities, HEP, symptom awareness, safety, POC, PT role Person educated: Patient Education method: Explanation and Handouts Education comprehension: verbalized understanding  HOME EXERCISE PROGRAM: Access Code: [email or web address] URL: [email or web address] Date: 04/15/2022 Prepared by: Candie Mile  Exercises - Seated Cervical Retraction  - 3 x daily - 7 x weekly - 2 sets - 10 reps - Seated Scapular Retraction  - 3 x daily - 7 x weekly - 2 sets - 10 reps - Seated Upper Trapezius Stretch  - 3 x daily - 7 x weekly - 2 sets - 30 sec hold  ASSESSMENT:  CLINICAL IMPRESSION: Good tolerance with progression of exercises focusing on UQ strength, posture, deep cervical flexors, ROM and balance. Cues for alignment, intensity and range below painful areas. Min guard for balance exercises, utilizing wall intermittently for support. Patient will continue to benefit from skilled physical therapy services to further improve independence with functional mobility.    OBJECTIVE IMPAIRMENTS: Abnormal gait, decreased activity tolerance, decreased balance, decreased knowledge of  condition, decreased knowledge of use of DME, decreased mobility, difficulty walking, decreased ROM, decreased strength, hypomobility, increased fascial restrictions, impaired flexibility, postural dysfunction, and pain.   ACTIVITY LIMITATIONS: carrying, lifting, squatting, stairs, reach over head, and locomotion level  PARTICIPATION LIMITATIONS: cleaning, laundry, driving, shopping, and community activity  PERSONAL FACTORS: Age and Time since onset of injury/illness/exacerbation are also affecting patient's functional outcome.   REHAB POTENTIAL: Excellent  CLINICAL DECISION MAKING: Stable/uncomplicated  EVALUATION COMPLEXITY: Low   GOALS: Goals reviewed with patient? Yes  SHORT TERM GOALS: Target date: 05/06/22  Patient will be independent with initial HEP and self-management strategies to improve functional outcomes Baseline: Initiated Goal status: INITIAL    LONG TERM GOALS: Target date: 05/27/22  Patient will be independent with advanced HEP and self-management strategies to improve functional outcomes Baseline:  Goal status: INITIAL  2.  Patient will improve FOTO score to predicted value of 51 to indicate improvement in functional outcomes Baseline: 39 Goal status: INITIAL  3.  Patient will demo improved combined cervical rotation by 10 degrees total  in order to improve ability to scan environment for safety and while driving. Baseline: see above Goal status: INITIAL  4. Patient will report a decrease in neck pain to no more than 3/10 for improved quality of life and ability to perform UE ADLs  Baseline: 6/10 Goal status: INITIAL     5. Patient will improve BERG balance score >to 51 points or greater to show low fall risk based on population that has hx of falls.    Baseline 43/56    Goal status: INITIAL  PLAN:  PT FREQUENCY: 2x/week  PT DURATION: 6 weeks  PLANNED INTERVENTIONS: Therapeutic exercises, Therapeutic activity, Neuromuscular re-education, Balance  training, Gait training, Patient/Family education, Self Care, Joint mobilization, Joint manipulation, Stair training, DME instructions, Dry Needling, Electrical stimulation, Spinal manipulation, Spinal mobilization, Cryotherapy, Moist heat, Taping, Traction, Ultrasound, Biofeedback, Ionotophoresis 78m/ml Dexamethasone, Manual therapy, and Re-evaluation  PLAN FOR NEXT SESSION: Cervical ROM, upper quarter strengthening and postural training. Balance training, gait training, fall risk reduction.  LCandie Mile PT, DPT Physical Therapist [facility] 04/26/2022, 5:11 PM

## 2022-04-30 ENCOUNTER — Ambulatory Visit (HOSPITAL_COMMUNITY): Payer: Medicare Other | Admitting: Physical Therapy

## 2022-04-30 DIAGNOSIS — M542 Cervicalgia: Secondary | ICD-10-CM | POA: Diagnosis not present

## 2022-04-30 DIAGNOSIS — R262 Difficulty in walking, not elsewhere classified: Secondary | ICD-10-CM

## 2022-04-30 DIAGNOSIS — M6281 Muscle weakness (generalized): Secondary | ICD-10-CM

## 2022-04-30 DIAGNOSIS — R2689 Other abnormalities of gait and mobility: Secondary | ICD-10-CM

## 2022-04-30 DIAGNOSIS — R296 Repeated falls: Secondary | ICD-10-CM

## 2022-04-30 NOTE — Therapy (Signed)
OUTPATIENT PHYSICAL THERAPY TREATMENT   Patient Name: [name] MRN: [id number] DOB:1933-10-13, 88 y.o., female Today's Date: 04/30/2022   PT End of Session - 04/30/22 1046     Visit Number 3    Number of Visits 12    Date for PT Re-Evaluation 07/22/22    Authorization Type [employer] Medicare    Authorization Time Period "no Auth, no VL"    Progress Note Due on Visit 10    PT Start Time 1045    PT Stop Time 1115    PT Time Calculation (min) 30 min    Activity Tolerance Patient tolerated treatment well    Behavior During Therapy WFL for tasks assessed/performed             Past Medical History:  Diagnosis Date   Ankle swelling    takes torsemide prn   Anxiety    Arthritis    Asthma    allergy induced   Back pain    Complication of anesthesia    COVID 2023   Depression    GERD (gastroesophageal reflux disease)    HOH (hard of hearing)    Hypercholesterolemia    Hypothyroid    Parkinson disease    PONV (postoperative nausea and vomiting)    Seasonal allergies    Past Surgical History:  Procedure Laterality Date   ABDOMINAL HYSTERECTOMY     CARPAL TUNNEL RELEASE Right 10/26/2015   Procedure: CARPAL TUNNEL RELEASE;  Surgeon: Daryll Brod, MD;  Location: [place];  Service: Orthopedics;  Laterality: Right;   CARPOMETACARPEL SUSPENSION PLASTY Right 10/26/2015   Procedure: RIGHT HAND TRAPEZIECTOMY WITH THUMB METACARPEL SUSPENSION PLASTY;  Surgeon: Daryll Brod, MD;  Location: [place];  Service: Orthopedics;  Laterality: Right;   CATARACT EXTRACTION W/PHACO  12/23/2011   Procedure: CATARACT EXTRACTION PHACO AND INTRAOCULAR LENS PLACEMENT (IOC);  Surgeon: Williams Che, MD;  Location: [facility];  Service: Ophthalmology;  Laterality: Right;  CDE=5.16   CHOLECYSTECTOMY     EYE SURGERY     cataract extraction of left eye-[facility]-2002   KNEE ARTHROSCOPY     right   NISSEN FUNDOPLICATION     [facility]   TRIGGER FINGER RELEASE Right 10/26/2015    Procedure: RELEASE A-1 PULLEY RIGHT RING FINGER;  Surgeon: Daryll Brod, MD;  Location: [place];  Service: Orthopedics;  Laterality: Right;   Patient Active Problem List   Diagnosis Date Noted   Cellulitis 12/11/2021   Cellulitis of right hand 12/10/2021   Hypocalcemia 12/10/2021   Normocytic anemia 12/10/2021   Depression 12/10/2021   Hypothyroidism 12/10/2021   Edema of left lower extremity 07/10/2021   GERD (gastroesophageal reflux disease)    History of Nissen fundoplication    IBS (irritable bowel syndrome) 02/05/2018   Diarrhea 06/16/2017   N&V (nausea and vomiting) 06/16/2017   Numbness 08/18/2015   Polyneuropathy 08/18/2015   Menopausal syndrome (hot flushes) 06/15/2015   Tremor, essential 06/15/2015   Gastroesophageal reflux disease with esophagitis 06/15/2015   Bilateral carpal tunnel syndrome 06/15/2015   Resting tremor 06/15/2015   Scoliosis of thoracic spine 11/05/2013   Spinal stenosis, unspecified region other than cervical 12/03/2012   Spondylosis of unspecified site without mention of myelopathy 12/03/2012   Parkinson disease ([place])    Back pain    Seasonal allergies     PCP: Leslie Andrea, MD  REFERRING PROVIDER: Ward Givens, NP  REFERRING DIAG: G20.A2 (ICD-10-CM) - Parkinson's disease without dyskinesia, with fluctuating manifestations M54.2 (ICD-10-CM) -  Neck pain  THERAPY DIAG:  Cervicalgia  Difficulty in walking, not elsewhere classified  Muscle weakness (generalized)  Other abnormalities of gait and mobility  Repeated falls  Rationale for Evaluation and Treatment: Rehabilitation  ONSET DATE: Neck pain several months ago. Falls have been on going, 2-3x per month. SUBJECTIVE:                                                                                                                                                                                                         SUBJECTIVE STATEMENT: Pt was late for appt.  States her neck is feeling a little better at 3/10.  No other pain or issues. States she is doing well with her initial HEP but not as well with the added exercises last session.  PERTINENT HISTORY:  Parkinson's disease - 1970  PAIN:  Are you having pain? Yes: NPRS scale: 3/10 Pain location: neck, Lt shoulder Pain description: sharp Aggravating factors: unknown Relieving factors: cream, tylenol, heat  PRECAUTIONS: Fall  WEIGHT BEARING RESTRICTIONS: No  FALLS:  Has patient fallen in last 6 months? Yes. Number of falls 2-3 per month  LIVING ENVIRONMENT: Lives with: lives alone Lives in: House/apartment Stairs: Yes: External: 2 steps; on right going up and on left going up Has following equipment at home: Single point cane  OCCUPATION: retired  PLOF: Independent with community mobility with device  PATIENT GOALS: Improve neck ROM, improve strength, reduce pain, improve balance, reduced falls.  NEXT MD VISIT: approx 2 mo  OBJECTIVE:   DIAGNOSTIC FINDINGS:  None recently  PATIENT SURVEYS:  FOTO 39  COGNITION: Overall cognitive status: Within functional limits for tasks assessed  SENSATION: WFL  POSTURE: rounded shoulders, forward head, and increased thoracic kyphosis  PALPATION: TTP Lt upper trap and cervical paraspinals   CERVICAL ROM:   Active ROM A/PROM (deg) eval  Flexion 50  Extension 26 P!  Right lateral flexion 12 P!  Left lateral flexion 12  Right rotation 32  Left rotation 33   (Blank rows = not tested)  UPPER EXTREMITY ROM: N/T  UPPER EXTREMITY MMT:  MMT Right eval Left eval  Shoulder flexion 5 5  Shoulder extension 5 5  4+ 4+ 5  Shoulder adduction    Shoulder extension    Shoulder internal rotation 5 5  Shoulder external rotation 4+ 5  Middle trapezius    Lower trapezius    Elbow flexion 5 5  Elbow extension 5 5  Wrist flexion 5 5  Wrist extension 5 4  Wrist ulnar deviation    Wrist  radial deviation    Wrist pronation     Wrist supination    Grip strength     (Blank rows = not tested)  CERVICAL SPECIAL TESTS:  Spurling's test: Positive and Distraction test: Positive  FUNCTIONAL TESTS:  5 times sit to stand: 13.5 Berg Balance Scale: 43  BERG  1. SITTING TO STANDING  4 able to stand without using hands and stabilize independently  2. STANDING UNSUPPORTED  4 able to stand safely for 2 minutes  If a subject is able to stand 2 minutes unsupported, score full points for sitting unsupported. Proceed to item #4  3. SITTING WITH BACK UNSUPPORTED BUT FEET SUPPORTED ON FLOOR OR ON A STOOL 4 able to sit safely and securely for 2 minutes  4. STANDING TO SITTING 4 sits safely with minimal use of hands  5. TRANSFERS 3 able to transfer safely definite need of hands  6. STANDING UNSUPPORTED WITH EYES CLOSED  3 able to stand 10 seconds with supervision  7. STANDING UNSUPPORTED WITH FEET TOGETHER  3 able to place feet together independently and stand 1 minute with supervision  8. REACHING FORWARD WITH OUTSTRETCHED ARM WHILE STANDING  3 can reach forward 12 cm (5 inches)  9. PICK UP OBJECT FROM THE FLOOR FROM A STANDING POSITION  3 able to pick up slipper but needs supervision  10. TURNING TO LOOK BEHIND OVER LEFT AND RIGHT SHOULDERS WHILE STANDING  3 looks behind one side only other side shows less weight shift  11. TURN 360 DEGREES 4 able to turn 360 degrees safely in 4 seconds or less  12. PLACE ALTERNATE FOOT ON STEP OR STOOL WHILE STANDING UNSUPPORTED  4 able to stand independently and safely and complete 8 steps in 20 seconds  13. STANDING UNSUPPORTED ONE FOOT IN FRONT 0 loses balance while stepping or standing  14. STANDING ON ONE LEG 1 tries to lift leg unable to hold 3 seconds but remains standing independently  TOTAL SCORE  43/56 History of falls and BBS < 51, or no history of falls and BBS < 42 is predictive of falls ([place]-Cook, 1997         TODAY'S TREATMENT:                                                                                                                               DATE:  04/30/22 Seated:  3D cervical excursions 10X each  Thoracic excusions with UE movements 5X each  Scapular retraction sitting upright 10X5" holds Standing:  Heelraises 20X  Hip vectors 5X5" each LE with 1 HHA  04/26/22 Reviewed HEP and goals HEP updated with thoracic extension x10 Chin tuck + lateral flexion x10 Chin tuck + rotation x10 RTB bil shoulder ER seated 2x10 RTB seated row 2x10 RTB seated shoulder extension 2x10  Standing Heel/toe rock with back to wall for safety x10 Standing 3 way hip vectors x5 ea   04/15/22 Eval, 5x sit  to stand, MMT, ROM HEP   PATIENT EDUCATION:  Education details: findings, modalities, HEP, symptom awareness, safety, POC, PT role Person educated: Patient Education method: Explanation and Handouts Education comprehension: verbalized understanding  HOME EXERCISE PROGRAM: Access Code: [email or web address] URL: [email or web address] Date: 04/15/2022 Prepared by: Candie Mile Exercises - Seated Cervical Retraction  - 3 x daily - 7 x weekly - 2 sets - 10 reps - Seated Scapular Retraction  - 3 x daily - 7 x weekly - 2 sets - 10 reps - Seated Upper Trapezius Stretch  - 3 x daily - 7 x weekly - 2 sets - 30 sec hold  04/30/22:  vector stance 5X5" with 1 HHA  ASSESSMENT:  CLINICAL IMPRESSION: Continued with cervical and thoracic mobility with addition of vectors.  Pt required cues with form and posturing to complete correctly.  Vectors required more cues for alignment, hold times, and maintaining forward gaze.  Given handout to add these home.  Treatment time limited due to late arrival.  Min guard for balance exercises and rest needed following standing activities. Patient will continue to benefit from skilled physical therapy services to further improve independence with functional mobility.    OBJECTIVE IMPAIRMENTS:  Abnormal gait, decreased activity tolerance, decreased balance, decreased knowledge of condition, decreased knowledge of use of DME, decreased mobility, difficulty walking, decreased ROM, decreased strength, hypomobility, increased fascial restrictions, impaired flexibility, postural dysfunction, and pain.   ACTIVITY LIMITATIONS: carrying, lifting, squatting, stairs, reach over head, and locomotion level  PARTICIPATION LIMITATIONS: cleaning, laundry, driving, shopping, and community activity  PERSONAL FACTORS: Age and Time since onset of injury/illness/exacerbation are also affecting patient's functional outcome.   REHAB POTENTIAL: Excellent  CLINICAL DECISION MAKING: Stable/uncomplicated  EVALUATION COMPLEXITY: Low   GOALS: Goals reviewed with patient? Yes  SHORT TERM GOALS: Target date: 05/06/22  Patient will be independent with initial HEP and self-management strategies to improve functional outcomes Baseline: Initiated Goal status: IN PROGRESS    LONG TERM GOALS: Target date: 05/27/22  Patient will be independent with advanced HEP and self-management strategies to improve functional outcomes Baseline:  Goal status: IN PROGRESS  2.  Patient will improve FOTO score to predicted value of 51 to indicate improvement in functional outcomes Baseline: 39 Goal status: IN PROGRESS  3.  Patient will demo improved combined cervical rotation by 10 degrees total  in order to improve ability to scan environment for safety and while driving. Baseline: see above Goal status: IN PROGRESS  4. Patient will report a decrease in neck pain to no more than 3/10 for improved quality of life and ability to perform UE ADLs  Baseline: 6/10 Goal status: IN PROGRESS     5. Patient will improve BERG balance score >to 51 points or greater to show low fall risk based on population that has hx of falls.    Baseline 43/56    Goal status: IN PROGRESS  PLAN:  PT FREQUENCY: 2x/week  PT DURATION: 6  weeks  PLANNED INTERVENTIONS: Therapeutic exercises, Therapeutic activity, Neuromuscular re-education, Balance training, Gait training, Patient/Family education, Self Care, Joint mobilization, Joint manipulation, Stair training, DME instructions, Dry Needling, Electrical stimulation, Spinal manipulation, Spinal mobilization, Cryotherapy, Moist heat, Taping, Traction, Ultrasound, Biofeedback, Ionotophoresis 61m/ml Dexamethasone, Manual therapy, and Re-evaluation  PLAN FOR NEXT SESSION: Cervical ROM, upper quarter strengthening and postural training. Balance training, gait training, fall risk reduction.  Begin static balance challenges next visit including tandem stance, SLS, hip strengthening.  ATeena Irani PTA/CLT CSnydertownPh:  [phone]  04/30/2022, 10:47 AM

## 2022-05-07 ENCOUNTER — Ambulatory Visit (HOSPITAL_COMMUNITY): Payer: Medicare Other | Admitting: Physical Therapy

## 2022-05-07 DIAGNOSIS — M6281 Muscle weakness (generalized): Secondary | ICD-10-CM

## 2022-05-07 DIAGNOSIS — R262 Difficulty in walking, not elsewhere classified: Secondary | ICD-10-CM

## 2022-05-07 DIAGNOSIS — M542 Cervicalgia: Secondary | ICD-10-CM | POA: Diagnosis not present

## 2022-05-07 DIAGNOSIS — R2689 Other abnormalities of gait and mobility: Secondary | ICD-10-CM

## 2022-05-07 DIAGNOSIS — R296 Repeated falls: Secondary | ICD-10-CM

## 2022-05-07 NOTE — Therapy (Signed)
OUTPATIENT PHYSICAL THERAPY TREATMENT   Patient Name: [name] MRN: [id number] DOB:Aug 23, 1933, 88 y.o., female Today's Date: 05/07/2022   PT End of Session - 05/07/22 1046     Visit Number 4    Number of Visits 12    Date for PT Re-Evaluation 07/22/22    Authorization Type [employer] Medicare    Authorization Time Period "no Auth, no VL"    Progress Note Due on Visit 10    PT Start Time 1042    PT Stop Time 1117    PT Time Calculation (min) 35 min    Activity Tolerance Patient tolerated treatment well    Behavior During Therapy WFL for tasks assessed/performed             Past Medical History:  Diagnosis Date   Ankle swelling    takes torsemide prn   Anxiety    Arthritis    Asthma    allergy induced   Back pain    Complication of anesthesia    COVID 2023   Depression    GERD (gastroesophageal reflux disease)    HOH (hard of hearing)    Hypercholesterolemia    Hypothyroid    Parkinson disease    PONV (postoperative nausea and vomiting)    Seasonal allergies    Past Surgical History:  Procedure Laterality Date   ABDOMINAL HYSTERECTOMY     CARPAL TUNNEL RELEASE Right 10/26/2015   Procedure: CARPAL TUNNEL RELEASE;  Surgeon: Daryll Brod, MD;  Location: [place];  Service: Orthopedics;  Laterality: Right;   CARPOMETACARPEL SUSPENSION PLASTY Right 10/26/2015   Procedure: RIGHT HAND TRAPEZIECTOMY WITH THUMB METACARPEL SUSPENSION PLASTY;  Surgeon: Daryll Brod, MD;  Location: [place];  Service: Orthopedics;  Laterality: Right;   CATARACT EXTRACTION W/PHACO  12/23/2011   Procedure: CATARACT EXTRACTION PHACO AND INTRAOCULAR LENS PLACEMENT (IOC);  Surgeon: Williams Che, MD;  Location: [facility];  Service: Ophthalmology;  Laterality: Right;  CDE=5.16   CHOLECYSTECTOMY     EYE SURGERY     cataract extraction of left eye-[facility]-2002   KNEE ARTHROSCOPY     right   NISSEN FUNDOPLICATION     [facility]   TRIGGER FINGER RELEASE Right 10/26/2015    Procedure: RELEASE A-1 PULLEY RIGHT RING FINGER;  Surgeon: Daryll Brod, MD;  Location: [place];  Service: Orthopedics;  Laterality: Right;   Patient Active Problem List   Diagnosis Date Noted   Cellulitis 12/11/2021   Cellulitis of right hand 12/10/2021   Hypocalcemia 12/10/2021   Normocytic anemia 12/10/2021   Depression 12/10/2021   Hypothyroidism 12/10/2021   Edema of left lower extremity 07/10/2021   GERD (gastroesophageal reflux disease)    History of Nissen fundoplication    IBS (irritable bowel syndrome) 02/05/2018   Diarrhea 06/16/2017   N&V (nausea and vomiting) 06/16/2017   Numbness 08/18/2015   Polyneuropathy 08/18/2015   Menopausal syndrome (hot flushes) 06/15/2015   Tremor, essential 06/15/2015   Gastroesophageal reflux disease with esophagitis 06/15/2015   Bilateral carpal tunnel syndrome 06/15/2015   Resting tremor 06/15/2015   Scoliosis of thoracic spine 11/05/2013   Spinal stenosis, unspecified region other than cervical 12/03/2012   Spondylosis of unspecified site without mention of myelopathy 12/03/2012   Parkinson disease ([place])    Back pain    Seasonal allergies     PCP: Leslie Andrea, MD  REFERRING PROVIDER: Ward Givens, NP  REFERRING DIAG: G20.A2 (ICD-10-CM) - Parkinson's disease without dyskinesia, with fluctuating manifestations M54.2 (ICD-10-CM) -  Neck pain  THERAPY DIAG:  Cervicalgia  Difficulty in walking, not elsewhere classified  Muscle weakness (generalized)  Other abnormalities of gait and mobility  Repeated falls  Rationale for Evaluation and Treatment: Rehabilitation  ONSET DATE: Neck pain several months ago. Falls have been on going, 2-3x per month. SUBJECTIVE:                                                                                                                                                                                                         SUBJECTIVE STATEMENT: Pt was late for appt again  today.  Discussed importance of ontime arrival to get full treatment.  States she has been somewhat compliant with HEP but has misplaced the ones added for her LE's.  States her neck pain is up today 4/10 central neck and into Lt shoulder.  States she has burned a place on her neck as she fell asleep with the heating pad on her neck.  No other pain or issues. States she is doing well with her initial HEP but not as well with the added exercises last session.  PERTINENT HISTORY:  Parkinson's disease - 1970  PAIN:  Are you having pain? Yes: NPRS scale: 3/10 Pain location: neck, Lt shoulder Pain description: sharp Aggravating factors: unknown Relieving factors: cream, tylenol, heat  PRECAUTIONS: Fall  WEIGHT BEARING RESTRICTIONS: No  FALLS:  Has patient fallen in last 6 months? Yes. Number of falls 2-3 per month  LIVING ENVIRONMENT: Lives with: lives alone Lives in: House/apartment Stairs: Yes: External: 2 steps; on right going up and on left going up Has following equipment at home: Single point cane  OCCUPATION: retired  PLOF: Independent with community mobility with device  PATIENT GOALS: Improve neck ROM, improve strength, reduce pain, improve balance, reduced falls.  NEXT MD VISIT: approx 2 mo  OBJECTIVE:   DIAGNOSTIC FINDINGS:  None recently  PATIENT SURVEYS:  FOTO 39  COGNITION: Overall cognitive status: Within functional limits for tasks assessed  SENSATION: WFL  POSTURE: rounded shoulders, forward head, and increased thoracic kyphosis  PALPATION: TTP Lt upper trap and cervical paraspinals   CERVICAL ROM:   Active ROM A/PROM (deg) eval  Flexion 50  Extension 26 P!  Right lateral flexion 12 P!  Left lateral flexion 12  Right rotation 32  Left rotation 33   (Blank rows = not tested)  UPPER EXTREMITY ROM: N/T  UPPER EXTREMITY MMT:  MMT Right eval Left eval  Shoulder flexion 5 5  Shoulder extension 5 5  4+ 4+ 5  Shoulder adduction  Shoulder extension    Shoulder internal rotation 5 5  Shoulder external rotation 4+ 5  Middle trapezius    Lower trapezius    Elbow flexion 5 5  Elbow extension 5 5  Wrist flexion 5 5  Wrist extension 5 4  Wrist ulnar deviation    Wrist radial deviation    Wrist pronation    Wrist supination    Grip strength     (Blank rows = not tested)  CERVICAL SPECIAL TESTS:  Spurling's test: Positive and Distraction test: Positive  FUNCTIONAL TESTS:  5 times sit to stand: 13.5 Berg Balance Scale: 43  BERG  1. SITTING TO STANDING  4 able to stand without using hands and stabilize independently  2. STANDING UNSUPPORTED  4 able to stand safely for 2 minutes  If a subject is able to stand 2 minutes unsupported, score full points for sitting unsupported. Proceed to item #4  3. SITTING WITH BACK UNSUPPORTED BUT FEET SUPPORTED ON FLOOR OR ON A STOOL 4 able to sit safely and securely for 2 minutes  4. STANDING TO SITTING 4 sits safely with minimal use of hands  5. TRANSFERS 3 able to transfer safely definite need of hands  6. STANDING UNSUPPORTED WITH EYES CLOSED  3 able to stand 10 seconds with supervision  7. STANDING UNSUPPORTED WITH FEET TOGETHER  3 able to place feet together independently and stand 1 minute with supervision  8. REACHING FORWARD WITH OUTSTRETCHED ARM WHILE STANDING  3 can reach forward 12 cm (5 inches)  9. PICK UP OBJECT FROM THE FLOOR FROM A STANDING POSITION  3 able to pick up slipper but needs supervision  10. TURNING TO LOOK BEHIND OVER LEFT AND RIGHT SHOULDERS WHILE STANDING  3 looks behind one side only other side shows less weight shift  11. TURN 360 DEGREES 4 able to turn 360 degrees safely in 4 seconds or less  12. PLACE ALTERNATE FOOT ON STEP OR STOOL WHILE STANDING UNSUPPORTED  4 able to stand independently and safely and complete 8 steps in 20 seconds  13. STANDING UNSUPPORTED ONE FOOT IN FRONT 0 loses balance while stepping or  standing  14. STANDING ON ONE LEG 1 tries to lift leg unable to hold 3 seconds but remains standing independently  TOTAL SCORE  43/56 History of falls and BBS < 51, or no history of falls and BBS < 42 is predictive of falls ([place]-Cook, 1997         TODAY'S TREATMENT:                                                                                                                              DATE:  05/07/22 Seated: 3D cervical excursions 10X each  Thoracic excursions with UE movements 5X each  Scapular retractions STanding:  tandem stance 3X30"  SLS attempts X 5 each  Hip abduction 10X each  Hip extension 10X each  Vectors 5X5" each with 1  UE assist  04/30/22 Seated:  3D cervical excursions 10X each  Thoracic excusions with UE movements 5X each  Scapular retraction sitting upright 10X5" holds Standing:  Heelraises 20X  Hip vectors 5X5" each LE with 1 HHA  04/26/22 Reviewed HEP and goals HEP updated with thoracic extension x10 Chin tuck + lateral flexion x10 Chin tuck + rotation x10 RTB bil shoulder ER seated 2x10 RTB seated row 2x10 RTB seated shoulder extension 2x10  Standing Heel/toe rock with back to wall for safety x10 Standing 3 way hip vectors x5 ea   04/15/22 Eval, 5x sit to stand, MMT, ROM HEP   PATIENT EDUCATION:  Education details: findings, modalities, HEP, symptom awareness, safety, POC, PT role Person educated: Patient Education method: Explanation and Handouts Education comprehension: verbalized understanding  HOME EXERCISE PROGRAM: Access Code: [email or web address] URL: [email or web address] Date: 04/15/2022 Prepared by: Candie Mile Exercises - Seated Cervical Retraction  - 3 x daily - 7 x weekly - 2 sets - 10 reps - Seated Scapular Retraction  - 3 x daily - 7 x weekly - 2 sets - 10 reps - Seated Upper Trapezius Stretch  - 3 x daily - 7 x weekly - 2 sets - 30 sec hold  04/30/22:  vector stance 5X5" with 1 HHA  Access Code:  [email or web address] URL: [email or web address] Date: 05/07/2022 Prepared by: Roseanne Reno Exercises - Tandem Stance with Support  - 2 x daily - 7 x weekly - 1 sets - 3 reps - 20 secs hold - Standing Hip Abduction  - 2 x daily - 7 x weekly - 1 sets - 10 reps - Standing Hip Extension  - 2 x daily - 7 x weekly - 1 sets - 10 reps  ASSESSMENT:  CLINICAL IMPRESSION: Continued with cervical and thoracic mobility in painfree ROM.  Pt given another copy of vectors as she has misplace this.  Therapist inspected burn on neck revealing small area over C7 that is fully granulated and almost healed.  Instructed to only use heat in 20 minute intervals and insure she has adequate cushion between to prevent skin irritation or burns.  Pt verbalized understanding.  Progressed with static balance activities and hip strengthening this session. Tactile cues to isolate correct mm with abduction and extension.  These given to add to HEP.   Pt required cues with form and posturing to complete correctly.  Treatment time limited due to late arrival.  Min guard for balance exercises and rest needed following standing activities. Patient will continue to benefit from skilled physical therapy services to further improve independence with functional mobility.   OBJECTIVE IMPAIRMENTS: Abnormal gait, decreased activity tolerance, decreased balance, decreased knowledge of condition, decreased knowledge of use of DME, decreased mobility, difficulty walking, decreased ROM, decreased strength, hypomobility, increased fascial restrictions, impaired flexibility, postural dysfunction, and pain.   ACTIVITY LIMITATIONS: carrying, lifting, squatting, stairs, reach over head, and locomotion level  PARTICIPATION LIMITATIONS: cleaning, laundry, driving, shopping, and community activity  PERSONAL FACTORS: Age and Time since onset of injury/illness/exacerbation are also affecting patient's functional outcome.   REHAB POTENTIAL:  Excellent  CLINICAL DECISION MAKING: Stable/uncomplicated  EVALUATION COMPLEXITY: Low   GOALS: Goals reviewed with patient? Yes  SHORT TERM GOALS: Target date: 05/06/22  Patient will be independent with initial HEP and self-management strategies to improve functional outcomes Baseline: Initiated Goal status: IN PROGRESS    LONG TERM GOALS: Target date: 05/27/22  Patient will be independent with advanced HEP and self-management strategies to improve functional outcomes Baseline:  Goal status:  IN PROGRESS  2.  Patient will improve FOTO score to predicted value of 51 to indicate improvement in functional outcomes Baseline: 39 Goal status: IN PROGRESS  3.  Patient will demo improved combined cervical rotation by 10 degrees total  in order to improve ability to scan environment for safety and while driving. Baseline: see above Goal status: IN PROGRESS  4. Patient will report a decrease in neck pain to no more than 3/10 for improved quality of life and ability to perform UE ADLs  Baseline: 6/10 Goal status: IN PROGRESS     5. Patient will improve BERG balance score >to 51 points or greater to show low fall risk based on population that has hx of falls.    Baseline 43/56    Goal status: IN PROGRESS  PLAN:  PT FREQUENCY: 2x/week  PT DURATION: 6 weeks  PLANNED INTERVENTIONS: Therapeutic exercises, Therapeutic activity, Neuromuscular re-education, Balance training, Gait training, Patient/Family education, Self Care, Joint mobilization, Joint manipulation, Stair training, DME instructions, Dry Needling, Electrical stimulation, Spinal manipulation, Spinal mobilization, Cryotherapy, Moist heat, Taping, Traction, Ultrasound, Biofeedback, Ionotophoresis 73m/ml Dexamethasone, Manual therapy, and Re-evaluation  PLAN FOR NEXT SESSION: Cervical ROM, upper quarter strengthening and postural training. Balance training, gait training, fall risk reduction.    ATeena Irani PTA/CLT CWarrenPh: [phone] 05/07/2022, 12:08 PM

## 2022-05-09 ENCOUNTER — Telehealth (HOSPITAL_COMMUNITY): Payer: Self-pay | Admitting: Physical Therapy

## 2022-05-09 ENCOUNTER — Encounter (HOSPITAL_COMMUNITY): Payer: Medicare Other | Admitting: Physical Therapy

## 2022-05-09 NOTE — Telephone Encounter (Signed)
Pt did not show for appt.  Patient called after scheduled appt time and left message to cancel.  Per CX/NS policy, this is still considered a N0-show.  Teena Irani, PTA/CLT [place] Ph: [phone]

## 2022-05-14 ENCOUNTER — Ambulatory Visit (HOSPITAL_COMMUNITY): Payer: Medicare Other | Attending: Adult Health | Admitting: Physical Therapy

## 2022-05-14 DIAGNOSIS — M6281 Muscle weakness (generalized): Secondary | ICD-10-CM | POA: Diagnosis present

## 2022-05-14 DIAGNOSIS — R296 Repeated falls: Secondary | ICD-10-CM | POA: Diagnosis present

## 2022-05-14 DIAGNOSIS — M542 Cervicalgia: Secondary | ICD-10-CM | POA: Insufficient documentation

## 2022-05-14 DIAGNOSIS — R262 Difficulty in walking, not elsewhere classified: Secondary | ICD-10-CM | POA: Insufficient documentation

## 2022-05-14 DIAGNOSIS — R2689 Other abnormalities of gait and mobility: Secondary | ICD-10-CM | POA: Insufficient documentation

## 2022-05-14 NOTE — Therapy (Signed)
OUTPATIENT PHYSICAL THERAPY TREATMENT   Patient Name: [name] MRN: [id number] DOB:Jul 29, 1933, 88 y.o., female Today's Date: 05/07/2022   PT End of Session - 05/07/22 1046     Visit Number 4    Number of Visits 12    Date for PT Re-Evaluation 07/22/22    Authorization Type [employer] Medicare    Authorization Time Period "no Auth, no VL"    Progress Note Due on Visit 10    PT Start Time 1042    PT Stop Time 1117    PT Time Calculation (min) 35 min    Activity Tolerance Patient tolerated treatment well    Behavior During Therapy WFL for tasks assessed/performed             Past Medical History:  Diagnosis Date   Ankle swelling    takes torsemide prn   Anxiety    Arthritis    Asthma    allergy induced   Back pain    Complication of anesthesia    COVID 2023   Depression    GERD (gastroesophageal reflux disease)    HOH (hard of hearing)    Hypercholesterolemia    Hypothyroid    Parkinson disease    PONV (postoperative nausea and vomiting)    Seasonal allergies    Past Surgical History:  Procedure Laterality Date   ABDOMINAL HYSTERECTOMY     CARPAL TUNNEL RELEASE Right 10/26/2015   Procedure: CARPAL TUNNEL RELEASE;  Surgeon: Daryll Brod, MD;  Location: [place];  Service: Orthopedics;  Laterality: Right;   CARPOMETACARPEL SUSPENSION PLASTY Right 10/26/2015   Procedure: RIGHT HAND TRAPEZIECTOMY WITH THUMB METACARPEL SUSPENSION PLASTY;  Surgeon: Daryll Brod, MD;  Location: [place];  Service: Orthopedics;  Laterality: Right;   CATARACT EXTRACTION W/PHACO  12/23/2011   Procedure: CATARACT EXTRACTION PHACO AND INTRAOCULAR LENS PLACEMENT (IOC);  Surgeon: Williams Che, MD;  Location: [facility];  Service: Ophthalmology;  Laterality: Right;  CDE=5.16   CHOLECYSTECTOMY     EYE SURGERY     cataract extraction of left eye-[facility]-2002   KNEE ARTHROSCOPY     right   NISSEN FUNDOPLICATION     [facility]   TRIGGER FINGER RELEASE Right 10/26/2015    Procedure: RELEASE A-1 PULLEY RIGHT RING FINGER;  Surgeon: Daryll Brod, MD;  Location: [place];  Service: Orthopedics;  Laterality: Right;   Patient Active Problem List   Diagnosis Date Noted   Cellulitis 12/11/2021   Cellulitis of right hand 12/10/2021   Hypocalcemia 12/10/2021   Normocytic anemia 12/10/2021   Depression 12/10/2021   Hypothyroidism 12/10/2021   Edema of left lower extremity 07/10/2021   GERD (gastroesophageal reflux disease)    History of Nissen fundoplication    IBS (irritable bowel syndrome) 02/05/2018   Diarrhea 06/16/2017   N&V (nausea and vomiting) 06/16/2017   Numbness 08/18/2015   Polyneuropathy 08/18/2015   Menopausal syndrome (hot flushes) 06/15/2015   Tremor, essential 06/15/2015   Gastroesophageal reflux disease with esophagitis 06/15/2015   Bilateral carpal tunnel syndrome 06/15/2015   Resting tremor 06/15/2015   Scoliosis of thoracic spine 11/05/2013   Spinal stenosis, unspecified region other than cervical 12/03/2012   Spondylosis of unspecified site without mention of myelopathy 12/03/2012   Parkinson disease ([place])    Back pain    Seasonal allergies     PCP: Leslie Andrea, MD  REFERRING PROVIDER: Ward Givens, NP  REFERRING DIAG: G20.A2 (ICD-10-CM) - Parkinson's disease without dyskinesia, with fluctuating manifestations M54.2 (ICD-10-CM) -  Neck pain  THERAPY DIAG:  Cervicalgia  Difficulty in walking, not elsewhere classified  Muscle weakness (generalized)  Other abnormalities of gait and mobility  Repeated falls  Rationale for Evaluation and Treatment: Rehabilitation  ONSET DATE: Neck pain several months ago. Falls have been on going, 2-3x per month. SUBJECTIVE:                                                                                                                                                                                                         SUBJECTIVE STATEMENT: Pt was late for appt again  today.  Pt walked up to session rather than using wheelchair.  PT states she had a near fall Saturday as her cat got in her way and she went down on her Rt knee.  No bruise or issues, she was able to get up.  Currenty having cervical pain and it is bother her still the most going in to LT shoulder.  Currently 3/10  PERTINENT HISTORY:  Parkinson's disease - 1970  PAIN:  Are you having pain? Yes: NPRS scale: 3/10 Pain location: neck, Lt shoulder Pain description: sharp Aggravating factors: unknown Relieving factors: cream, tylenol, heat  PRECAUTIONS: Fall  WEIGHT BEARING RESTRICTIONS: No  FALLS:  Has patient fallen in last 6 months? Yes. Number of falls 2-3 per month  LIVING ENVIRONMENT: Lives with: lives alone Lives in: House/apartment Stairs: Yes: External: 2 steps; on right going up and on left going up Has following equipment at home: Single point cane  OCCUPATION: retired  PLOF: Independent with community mobility with device  PATIENT GOALS: Improve neck ROM, improve strength, reduce pain, improve balance, reduced falls.  NEXT MD VISIT: approx 2 mo  OBJECTIVE:   DIAGNOSTIC FINDINGS:  None recently  PATIENT SURVEYS:  FOTO 39  COGNITION: Overall cognitive status: Within functional limits for tasks assessed  SENSATION: WFL  POSTURE: rounded shoulders, forward head, and increased thoracic kyphosis  PALPATION: TTP Lt upper trap and cervical paraspinals   CERVICAL ROM:   Active ROM A/PROM (deg) eval  Flexion 50  Extension 26 P!  Right lateral flexion 12 P!  Left lateral flexion 12  Right rotation 32  Left rotation 33   (Blank rows = not tested)  UPPER EXTREMITY ROM: N/T  UPPER EXTREMITY MMT:  MMT Right eval Left eval  Shoulder flexion 5 5  Shoulder extension 5 5  4+ 4+ 5  Shoulder adduction    Shoulder extension    Shoulder internal rotation 5 5  Shoulder external rotation 4+ 5  Middle trapezius  Lower trapezius    Elbow flexion 5 5   Elbow extension 5 5  Wrist flexion 5 5  Wrist extension 5 4  Wrist ulnar deviation    Wrist radial deviation    Wrist pronation    Wrist supination    Grip strength     (Blank rows = not tested)  CERVICAL SPECIAL TESTS:  Spurling's test: Positive and Distraction test: Positive  FUNCTIONAL TESTS:  5 times sit to stand: 13.5 Berg Balance Scale: 43  BERG  1. SITTING TO STANDING  4 able to stand without using hands and stabilize independently  2. STANDING UNSUPPORTED  4 able to stand safely for 2 minutes  If a subject is able to stand 2 minutes unsupported, score full points for sitting unsupported. Proceed to item #4  3. SITTING WITH BACK UNSUPPORTED BUT FEET SUPPORTED ON FLOOR OR ON A STOOL 4 able to sit safely and securely for 2 minutes  4. STANDING TO SITTING 4 sits safely with minimal use of hands  5. TRANSFERS 3 able to transfer safely definite need of hands  6. STANDING UNSUPPORTED WITH EYES CLOSED  3 able to stand 10 seconds with supervision  7. STANDING UNSUPPORTED WITH FEET TOGETHER  3 able to place feet together independently and stand 1 minute with supervision  8. REACHING FORWARD WITH OUTSTRETCHED ARM WHILE STANDING  3 can reach forward 12 cm (5 inches)  9. PICK UP OBJECT FROM THE FLOOR FROM A STANDING POSITION  3 able to pick up slipper but needs supervision  10. TURNING TO LOOK BEHIND OVER LEFT AND RIGHT SHOULDERS WHILE STANDING  3 looks behind one side only other side shows less weight shift  11. TURN 360 DEGREES 4 able to turn 360 degrees safely in 4 seconds or less  12. PLACE ALTERNATE FOOT ON STEP OR STOOL WHILE STANDING UNSUPPORTED  4 able to stand independently and safely and complete 8 steps in 20 seconds  13. STANDING UNSUPPORTED ONE FOOT IN FRONT 0 loses balance while stepping or standing  14. STANDING ON ONE LEG 1 tries to lift leg unable to hold 3 seconds but remains standing independently  TOTAL SCORE  43/56 History of falls and  BBS < 51, or no history of falls and BBS < 42 is predictive of falls ([place]-Cook, 1997         TODAY'S TREATMENT:                                                                                                                              DATE:  05/14/22 Seated:  3D cervical excursions 10X each Standing: GTB scap retractions 2X10  Rows GTB 2X10  Extensions GTB 2X10  UE flexion against wall 10X  UE flexion facing wall stretch 10X10" holds  05/07/22 Seated: 3D cervical excursions 10X each  Thoracic excursions with UE movements 5X each  Scapular retractions STanding:  tandem stance 3X30"  SLS attempts X 5 each  Hip abduction 10X each  Hip extension 10X each  Vectors 5X5" each with 1 UE assist  04/30/22 Seated:  3D cervical excursions 10X each  Thoracic excusions with UE movements 5X each  Scapular retraction sitting upright 10X5" holds Standing:  Heelraises 20X  Hip vectors 5X5" each LE with 1 HHA  04/26/22 Reviewed HEP and goals HEP updated with thoracic extension x10 Chin tuck + lateral flexion x10 Chin tuck + rotation x10 RTB bil shoulder ER seated 2x10 RTB seated row 2x10 RTB seated shoulder extension 2x10  Standing Heel/toe rock with back to wall for safety x10 Standing 3 way hip vectors x5 ea   04/15/22 Eval, 5x sit to stand, MMT, ROM HEP   PATIENT EDUCATION:  Education details: findings, modalities, HEP, symptom awareness, safety, POC, PT role Person educated: Patient Education method: Explanation and Handouts Education comprehension: verbalized understanding  HOME EXERCISE PROGRAM: Access Code: [email or web address] URL: [email or web address] Date: 04/15/2022 Prepared by: Candie Mile Exercises - Seated Cervical Retraction  - 3 x daily - 7 x weekly - 2 sets - 10 reps - Seated Scapular Retraction  - 3 x daily - 7 x weekly - 2 sets - 10 reps - Seated Upper Trapezius Stretch  - 3 x daily - 7 x weekly - 2 sets - 30 sec hold  04/30/22:  vector  stance 5X5" with 1 HHA  Access Code: [email or web address] URL: [email or web address] Date: 05/07/2022 Prepared by: Roseanne Reno Exercises - Tandem Stance with Support  - 2 x daily - 7 x weekly - 1 sets - 3 reps - 20 secs hold - Standing Hip Abduction  - 2 x daily - 7 x weekly - 1 sets - 10 reps - Standing Hip Extension  - 2 x daily - 7 x weekly - 1 sets - 10 reps  ASSESSMENT:  CLINICAL IMPRESSION: Focus today on postural strengthening and core stability with UE challenges. Tactile cues to remain upright during tband as tends to lean back into extension. Pt required several seated rest breaks during session today due to fatigue.  Unable to address all therex today as session was limited by late arrival. Patient will continue to benefit from skilled physical therapy services to further improve independence with functional mobility.   OBJECTIVE IMPAIRMENTS: Abnormal gait, decreased activity tolerance, decreased balance, decreased knowledge of condition, decreased knowledge of use of DME, decreased mobility, difficulty walking, decreased ROM, decreased strength, hypomobility, increased fascial restrictions, impaired flexibility, postural dysfunction, and pain.   ACTIVITY LIMITATIONS: carrying, lifting, squatting, stairs, reach over head, and locomotion level  PARTICIPATION LIMITATIONS: cleaning, laundry, driving, shopping, and community activity  PERSONAL FACTORS: Age and Time since onset of injury/illness/exacerbation are also affecting patient's functional outcome.   REHAB POTENTIAL: Excellent  CLINICAL DECISION MAKING: Stable/uncomplicated  EVALUATION COMPLEXITY: Low   GOALS: Goals reviewed with patient? Yes  SHORT TERM GOALS: Target date: 05/06/22  Patient will be independent with initial HEP and self-management strategies to improve functional outcomes Baseline: Initiated Goal status: IN PROGRESS    LONG TERM GOALS: Target date: 05/27/22  Patient will be independent with  advanced HEP and self-management strategies to improve functional outcomes Baseline:  Goal status: IN PROGRESS  2.  Patient will improve FOTO score to predicted value of 51 to indicate improvement in functional outcomes Baseline: 39 Goal status: IN PROGRESS  3.  Patient will demo improved combined cervical rotation by 10 degrees total  in order to improve ability to scan environment for safety and while driving. Baseline: see above Goal status:  IN PROGRESS  4. Patient will report a decrease in neck pain to no more than 3/10 for improved quality of life and ability to perform UE ADLs  Baseline: 6/10 Goal status: IN PROGRESS     5. Patient will improve BERG balance score >to 51 points or greater to show low fall risk based on population that has hx of falls.    Baseline 43/56    Goal status: IN PROGRESS  PLAN:  PT FREQUENCY: 2x/week  PT DURATION: 6 weeks  PLANNED INTERVENTIONS: Therapeutic exercises, Therapeutic activity, Neuromuscular re-education, Balance training, Gait training, Patient/Family education, Self Care, Joint mobilization, Joint manipulation, Stair training, DME instructions, Dry Needling, Electrical stimulation, Spinal manipulation, Spinal mobilization, Cryotherapy, Moist heat, Taping, Traction, Ultrasound, Biofeedback, Ionotophoresis 66m/ml Dexamethasone, Manual therapy, and Re-evaluation  PLAN FOR NEXT SESSION: Cervical ROM, upper quarter strengthening and postural training. Balance training, gait training, fall risk reduction.    ATeena Irani PTA/CLT [place]: [phone] 05/07/2022, 12:08 PM

## 2022-05-16 ENCOUNTER — Encounter (HOSPITAL_COMMUNITY): Payer: Medicare Other | Admitting: Physical Therapy

## 2022-05-16 ENCOUNTER — Telehealth (HOSPITAL_COMMUNITY): Payer: Self-pay | Admitting: Physical Therapy

## 2022-05-16 NOTE — Telephone Encounter (Signed)
Pt did not show for appt. Unable to reach by phone.  Teena Irani, PTA/CLT [place] Ph: [phone]

## 2022-05-21 ENCOUNTER — Encounter (HOSPITAL_COMMUNITY): Payer: Self-pay | Admitting: Physical Therapy

## 2022-05-21 ENCOUNTER — Ambulatory Visit (HOSPITAL_COMMUNITY): Payer: Medicare Other | Admitting: Physical Therapy

## 2022-05-21 DIAGNOSIS — R2689 Other abnormalities of gait and mobility: Secondary | ICD-10-CM

## 2022-05-21 DIAGNOSIS — R296 Repeated falls: Secondary | ICD-10-CM

## 2022-05-21 DIAGNOSIS — M542 Cervicalgia: Secondary | ICD-10-CM

## 2022-05-21 DIAGNOSIS — R262 Difficulty in walking, not elsewhere classified: Secondary | ICD-10-CM

## 2022-05-21 DIAGNOSIS — M6281 Muscle weakness (generalized): Secondary | ICD-10-CM

## 2022-05-21 NOTE — Therapy (Signed)
OUTPATIENT PHYSICAL THERAPY TREATMENT   Patient Name: [name] MRN: [id number] DOB:05/29/1934, 88 y.o., female Today's Date: 05/21/2022   PT End of Session - 05/21/22 1130     Visit Number 6    Number of Visits 12    Date for PT Re-Evaluation 07/22/22    Authorization Type [employer] Medicare    Authorization Time Period "no Auth, no VL"    Progress Note Due on Visit 10    PT Start Time 1130   arrives late   PT Stop Time 1202    PT Time Calculation (min) 32 min    Activity Tolerance Patient tolerated treatment well    Behavior During Therapy WFL for tasks assessed/performed             Past Medical History:  Diagnosis Date   Ankle swelling    takes torsemide prn   Anxiety    Arthritis    Asthma    allergy induced   Back pain    Complication of anesthesia    COVID 2023   Depression    GERD (gastroesophageal reflux disease)    HOH (hard of hearing)    Hypercholesterolemia    Hypothyroid    Parkinson disease    PONV (postoperative nausea and vomiting)    Seasonal allergies    Past Surgical History:  Procedure Laterality Date   ABDOMINAL HYSTERECTOMY     CARPAL TUNNEL RELEASE Right 10/26/2015   Procedure: CARPAL TUNNEL RELEASE;  Surgeon: Daryll Brod, MD;  Location: [facility];  Service: Orthopedics;  Laterality: Right;   CARPOMETACARPEL SUSPENSION PLASTY Right 10/26/2015   Procedure: RIGHT HAND TRAPEZIECTOMY WITH THUMB METACARPEL SUSPENSION PLASTY;  Surgeon: Daryll Brod, MD;  Location: [facility];  Service: Orthopedics;  Laterality: Right;   CATARACT EXTRACTION W/PHACO  12/23/2011   Procedure: CATARACT EXTRACTION PHACO AND INTRAOCULAR LENS PLACEMENT (IOC);  Surgeon: Williams Che, MD;  Location: [facility];  Service: Ophthalmology;  Laterality: Right;  CDE=5.16   CHOLECYSTECTOMY     EYE SURGERY     cataract extraction of left eye-[facility]-2002   KNEE ARTHROSCOPY     right   NISSEN FUNDOPLICATION     [facility]   TRIGGER FINGER RELEASE Right  10/26/2015   Procedure: RELEASE A-1 PULLEY RIGHT RING FINGER;  Surgeon: Daryll Brod, MD;  Location: [facility];  Service: Orthopedics;  Laterality: Right;   Patient Active Problem List   Diagnosis Date Noted   Cellulitis 12/11/2021   Cellulitis of right hand 12/10/2021   Hypocalcemia 12/10/2021   Normocytic anemia 12/10/2021   Depression 12/10/2021   Hypothyroidism 12/10/2021   Edema of left lower extremity 07/10/2021   GERD (gastroesophageal reflux disease)    History of Nissen fundoplication    IBS (irritable bowel syndrome) 02/05/2018   Diarrhea 06/16/2017   N&V (nausea and vomiting) 06/16/2017   Numbness 08/18/2015   Polyneuropathy 08/18/2015   Menopausal syndrome (hot flushes) 06/15/2015   Tremor, essential 06/15/2015   Gastroesophageal reflux disease with esophagitis 06/15/2015   Bilateral carpal tunnel syndrome 06/15/2015   Resting tremor 06/15/2015   Scoliosis of thoracic spine 11/05/2013   Spinal stenosis, unspecified region other than cervical 12/03/2012   Spondylosis of unspecified site without mention of myelopathy 12/03/2012   Parkinson disease ([place])    Back pain    Seasonal allergies     PCP: Leslie Andrea, MD  REFERRING PROVIDER: Ward Givens, NP  REFERRING DIAG: G20.A2 (ICD-10-CM) - Parkinson's disease without dyskinesia, with fluctuating manifestations  M54.2 (ICD-10-CM) - Neck pain  THERAPY DIAG:  Cervicalgia  Difficulty in walking, not elsewhere classified  Muscle weakness (generalized)  Other abnormalities of gait and mobility  Repeated falls  Rationale for Evaluation and Treatment: Rehabilitation  ONSET DATE: Neck pain several months ago. Falls have been on going, 2-3x per month. SUBJECTIVE:                                                                                                                                                                                                         SUBJECTIVE STATEMENT: Pt was late  for appt again today. Patient states neck is so sore. She has been using Voltaren on it. Neck pain goes into L UT some.   PERTINENT HISTORY:  Parkinson's disease - 1970  PAIN:  Are you having pain? Yes: NPRS scale: 3-4/10 Pain location: neck, Lt shoulder Pain description: sharp Aggravating factors: unknown Relieving factors: cream, tylenol, heat  PRECAUTIONS: Fall  WEIGHT BEARING RESTRICTIONS: No  FALLS:  Has patient fallen in last 6 months? Yes. Number of falls 2-3 per month  LIVING ENVIRONMENT: Lives with: lives alone Lives in: House/apartment Stairs: Yes: External: 2 steps; on right going up and on left going up Has following equipment at home: Single point cane  OCCUPATION: retired  PLOF: Independent with community mobility with device  PATIENT GOALS: Improve neck ROM, improve strength, reduce pain, improve balance, reduced falls.  NEXT MD VISIT: approx 2 mo  OBJECTIVE:   DIAGNOSTIC FINDINGS:  None recently  PATIENT SURVEYS:  FOTO 39  COGNITION: Overall cognitive status: Within functional limits for tasks assessed  SENSATION: WFL  POSTURE: rounded shoulders, forward head, and increased thoracic kyphosis  PALPATION: TTP Lt upper trap and cervical paraspinals   CERVICAL ROM:   Active ROM A/PROM (deg) eval  Flexion 50  Extension 26 P!  Right lateral flexion 12 P!  Left lateral flexion 12  Right rotation 32  Left rotation 33   (Blank rows = not tested)  UPPER EXTREMITY ROM: N/T  UPPER EXTREMITY MMT:  MMT Right eval Left eval  Shoulder flexion 5 5  Shoulder extension 5 5  4+ 4+ 5  Shoulder adduction    Shoulder extension    Shoulder internal rotation 5 5  Shoulder external rotation 4+ 5  Middle trapezius    Lower trapezius    Elbow flexion 5 5  Elbow extension 5 5  Wrist flexion 5 5  Wrist extension 5 4  Wrist ulnar deviation    Wrist radial deviation    Wrist pronation  Wrist supination    Grip strength     (Blank rows = not  tested)  CERVICAL SPECIAL TESTS:  Spurling's test: Positive and Distraction test: Positive  FUNCTIONAL TESTS:  5 times sit to stand: 13.5 Berg Balance Scale: 43  BERG  1. SITTING TO STANDING  4 able to stand without using hands and stabilize independently  2. STANDING UNSUPPORTED  4 able to stand safely for 2 minutes  If a subject is able to stand 2 minutes unsupported, score full points for sitting unsupported. Proceed to item #4  3. SITTING WITH BACK UNSUPPORTED BUT FEET SUPPORTED ON FLOOR OR ON A STOOL 4 able to sit safely and securely for 2 minutes  4. STANDING TO SITTING 4 sits safely with minimal use of hands  5. TRANSFERS 3 able to transfer safely definite need of hands  6. STANDING UNSUPPORTED WITH EYES CLOSED  3 able to stand 10 seconds with supervision  7. STANDING UNSUPPORTED WITH FEET TOGETHER  3 able to place feet together independently and stand 1 minute with supervision  8. REACHING FORWARD WITH OUTSTRETCHED ARM WHILE STANDING  3 can reach forward 12 cm (5 inches)  9. PICK UP OBJECT FROM THE FLOOR FROM A STANDING POSITION  3 able to pick up slipper but needs supervision  10. TURNING TO LOOK BEHIND OVER LEFT AND RIGHT SHOULDERS WHILE STANDING  3 looks behind one side only other side shows less weight shift  11. TURN 360 DEGREES 4 able to turn 360 degrees safely in 4 seconds or less  12. PLACE ALTERNATE FOOT ON STEP OR STOOL WHILE STANDING UNSUPPORTED  4 able to stand independently and safely and complete 8 steps in 20 seconds  13. STANDING UNSUPPORTED ONE FOOT IN FRONT 0 loses balance while stepping or standing  14. STANDING ON ONE LEG 1 tries to lift leg unable to hold 3 seconds but remains standing independently  TOTAL SCORE  43/56 History of falls and BBS < 51, or no history of falls and BBS < 42 is predictive of falls ([place]-Cook, 1997         TODAY'S TREATMENT:                                                                                                                               DATE:  05/21/22 Supine cervical retractions 3 x 10  Supine scapular retractions 2 x 10  Bridge 2 x 10  Supine shoulder flexion with GTB in hands 2x 10  STS with overhead reach 2 x 5  05/14/22 Seated:  3D cervical excursions 10X each Standing: GTB scap retractions 2X10  Rows GTB 2X10  Extensions GTB 2X10  UE flexion against wall 10X  UE flexion facing wall stretch 10X10" holds  05/07/22 Seated: 3D cervical excursions 10X each  Thoracic excursions with UE movements 5X each  Scapular retractions STanding:  tandem stance 3X30"  SLS attempts X 5 each  Hip abduction 10X each  Hip extension 10X each  Vectors 5X5" each with 1 UE assist  04/30/22 Seated:  3D cervical excursions 10X each  Thoracic excusions with UE movements 5X each  Scapular retraction sitting upright 10X5" holds Standing:  Heelraises 20X  Hip vectors 5X5" each LE with 1 HHA  04/26/22 Reviewed HEP and goals HEP updated with thoracic extension x10 Chin tuck + lateral flexion x10 Chin tuck + rotation x10 RTB bil shoulder ER seated 2x10 RTB seated row 2x10 RTB seated shoulder extension 2x10  Standing Heel/toe rock with back to wall for safety x10 Standing 3 way hip vectors x5 ea   04/15/22 Eval, 5x sit to stand, MMT, ROM HEP   PATIENT EDUCATION:  Education details: 12/12 HEP; EVAL findings, modalities, HEP, symptom awareness, safety, POC, PT role Person educated: Patient Education method: Explanation and Handouts Education comprehension: verbalized understanding  HOME EXERCISE PROGRAM: Access Code: [email or web address] URL: [email or web address] Date: 04/15/2022 Prepared by: Candie Mile Exercises - Seated Cervical Retraction  - 3 x daily - 7 x weekly - 2 sets - 10 reps - Seated Scapular Retraction  - 3 x daily - 7 x weekly - 2 sets - 10 reps - Seated Upper Trapezius Stretch  - 3 x daily - 7 x weekly - 2 sets - 30 sec hold  04/30/22:  vector  stance 5X5" with 1 HHA  Access Code: [email or web address] URL: [email or web address] Date: 05/07/2022 Prepared by: Roseanne Reno Exercises - Tandem Stance with Support  - 2 x daily - 7 x weekly - 1 sets - 3 reps - 20 secs hold - Standing Hip Abduction  - 2 x daily - 7 x weekly - 1 sets - 10 reps - Standing Hip Extension  - 2 x daily - 7 x weekly - 1 sets - 10 reps  05/21/22 - Sit to Stand Without Arm Support  - 1 x daily - 7 x weekly - 3 sets - 5 reps  ASSESSMENT:  CLINICAL IMPRESSION: Session limited by patient's late arrival. Began session with supine exercises as patient has been having increase in cervical symptoms. Patient requires multimodal cueing for cervical retraction mechanics with good/fair carry over. Added STS with overhead reach for strength, balance, large amplitude mobility, and posture. Patient will continue to benefit from physical therapy in order to improve function and reduce impairment.    OBJECTIVE IMPAIRMENTS: Abnormal gait, decreased activity tolerance, decreased balance, decreased knowledge of condition, decreased knowledge of use of DME, decreased mobility, difficulty walking, decreased ROM, decreased strength, hypomobility, increased fascial restrictions, impaired flexibility, postural dysfunction, and pain.   ACTIVITY LIMITATIONS: carrying, lifting, squatting, stairs, reach over head, and locomotion level  PARTICIPATION LIMITATIONS: cleaning, laundry, driving, shopping, and community activity  PERSONAL FACTORS: Age and Time since onset of injury/illness/exacerbation are also affecting patient's functional outcome.   REHAB POTENTIAL: Excellent  CLINICAL DECISION MAKING: Stable/uncomplicated  EVALUATION COMPLEXITY: Low   GOALS: Goals reviewed with patient? Yes  SHORT TERM GOALS: Target date: 05/06/22  Patient will be independent with initial HEP and self-management strategies to improve functional outcomes Baseline: Initiated Goal status: IN  PROGRESS    LONG TERM GOALS: Target date: 05/27/22  Patient will be independent with advanced HEP and self-management strategies to improve functional outcomes Baseline:  Goal status: IN PROGRESS  2.  Patient will improve FOTO score to predicted value of 51 to indicate improvement in functional outcomes Baseline: 39 Goal status: IN PROGRESS  3.  Patient will demo improved combined cervical rotation by 10 degrees total  in order  to improve ability to scan environment for safety and while driving. Baseline: see above Goal status: IN PROGRESS  4. Patient will report a decrease in neck pain to no more than 3/10 for improved quality of life and ability to perform UE ADLs  Baseline: 6/10 Goal status: IN PROGRESS     5. Patient will improve BERG balance score >to 51 points or greater to show low fall risk based on population that has hx of falls.    Baseline 43/56    Goal status: IN PROGRESS  PLAN:  PT FREQUENCY: 2x/week  PT DURATION: 6 weeks  PLANNED INTERVENTIONS: Therapeutic exercises, Therapeutic activity, Neuromuscular re-education, Balance training, Gait training, Patient/Family education, Self Care, Joint mobilization, Joint manipulation, Stair training, DME instructions, Dry Needling, Electrical stimulation, Spinal manipulation, Spinal mobilization, Cryotherapy, Moist heat, Taping, Traction, Ultrasound, Biofeedback, Ionotophoresis 9m/ml Dexamethasone, Manual therapy, and Re-evaluation  PLAN FOR NEXT SESSION: Cervical ROM, upper quarter strengthening and postural training. Balance training, gait training, fall risk reduction.    12:03 PM, 05/21/22 AMearl LatinPT, DPT Physical Therapist [facility]

## 2022-05-23 ENCOUNTER — Ambulatory Visit (HOSPITAL_COMMUNITY): Payer: Medicare Other

## 2022-05-23 DIAGNOSIS — M542 Cervicalgia: Secondary | ICD-10-CM

## 2022-05-23 DIAGNOSIS — M6281 Muscle weakness (generalized): Secondary | ICD-10-CM

## 2022-05-23 DIAGNOSIS — R296 Repeated falls: Secondary | ICD-10-CM

## 2022-05-23 DIAGNOSIS — R262 Difficulty in walking, not elsewhere classified: Secondary | ICD-10-CM

## 2022-05-23 DIAGNOSIS — R2689 Other abnormalities of gait and mobility: Secondary | ICD-10-CM

## 2022-05-23 NOTE — Therapy (Signed)
OUTPATIENT PHYSICAL THERAPY TREATMENT   Patient Name: [name] MRN: [id number] DOB:02-Sep-1933, 88 y.o., female Today's Date: 05/23/2022   PT End of Session - 05/23/22 1133     Visit Number 7    Number of Visits 12    Date for PT Re-Evaluation 07/22/22    Authorization Type [employer] Medicare    Authorization Time Period "no Auth, no VL"    Progress Note Due on Visit 10    PT Start Time 1133    PT Stop Time 1200    PT Time Calculation (min) 27 min    Activity Tolerance Patient tolerated treatment well    Behavior During Therapy WFL for tasks assessed/performed             Past Medical History:  Diagnosis Date   Ankle swelling    takes torsemide prn   Anxiety    Arthritis    Asthma    allergy induced   Back pain    Complication of anesthesia    COVID 2023   Depression    GERD (gastroesophageal reflux disease)    HOH (hard of hearing)    Hypercholesterolemia    Hypothyroid    Parkinson disease    PONV (postoperative nausea and vomiting)    Seasonal allergies    Past Surgical History:  Procedure Laterality Date   ABDOMINAL HYSTERECTOMY     CARPAL TUNNEL RELEASE Right 10/26/2015   Procedure: CARPAL TUNNEL RELEASE;  Surgeon: Daryll Brod, MD;  Location: [place];  Service: Orthopedics;  Laterality: Right;   CARPOMETACARPEL SUSPENSION PLASTY Right 10/26/2015   Procedure: RIGHT HAND TRAPEZIECTOMY WITH THUMB METACARPEL SUSPENSION PLASTY;  Surgeon: Daryll Brod, MD;  Location: [facility];  Service: Orthopedics;  Laterality: Right;   CATARACT EXTRACTION W/PHACO  12/23/2011   Procedure: CATARACT EXTRACTION PHACO AND INTRAOCULAR LENS PLACEMENT (IOC);  Surgeon: Williams Che, MD;  Location: [facility];  Service: Ophthalmology;  Laterality: Right;  CDE=5.16   CHOLECYSTECTOMY     EYE SURGERY     cataract extraction of left eye-[facility]-2002   KNEE ARTHROSCOPY     right   NISSEN FUNDOPLICATION     [facility]   TRIGGER FINGER RELEASE Right 10/26/2015    Procedure: RELEASE A-1 PULLEY RIGHT RING FINGER;  Surgeon: Daryll Brod, MD;  Location: [place] [facility];  Service: Orthopedics;  Laterality: Right;   Patient Active Problem List   Diagnosis Date Noted   Cellulitis 12/11/2021   Cellulitis of right hand 12/10/2021   Hypocalcemia 12/10/2021   Normocytic anemia 12/10/2021   Depression 12/10/2021   Hypothyroidism 12/10/2021   Edema of left lower extremity 07/10/2021   GERD (gastroesophageal reflux disease)    History of Nissen fundoplication    IBS (irritable bowel syndrome) 02/05/2018   Diarrhea 06/16/2017   N&V (nausea and vomiting) 06/16/2017   Numbness 08/18/2015   Polyneuropathy 08/18/2015   Menopausal syndrome (hot flushes) 06/15/2015   Tremor, essential 06/15/2015   Gastroesophageal reflux disease with esophagitis 06/15/2015   Bilateral carpal tunnel syndrome 06/15/2015   Resting tremor 06/15/2015   Scoliosis of thoracic spine 11/05/2013   Spinal stenosis, unspecified region other than cervical 12/03/2012   Spondylosis of unspecified site without mention of myelopathy 12/03/2012   Parkinson disease ([place])    Back pain    Seasonal allergies     PCP: Leslie Andrea, MD  REFERRING PROVIDER: Ward Givens, NP  REFERRING DIAG: G20.A2 (ICD-10-CM) - Parkinson's disease without dyskinesia, with fluctuating manifestations M54.2 (ICD-10-CM) -  Neck pain  THERAPY DIAG:  Cervicalgia  Difficulty in walking, not elsewhere classified  Muscle weakness (generalized)  Other abnormalities of gait and mobility  Repeated falls  Rationale for Evaluation and Treatment: Rehabilitation  ONSET DATE: Neck pain several months ago. Falls have been on going, 2-3x per month. SUBJECTIVE:                                                                                                                                                                                                         SUBJECTIVE STATEMENT: Pt was late for appt again  today. Neck is feeling better but sore from last visit.   PERTINENT HISTORY:  Parkinson's disease - 1970  PAIN:  Are you having pain? Yes: NPRS scale: 2/10 Pain location: neck, Lt shoulder Pain description: sharp Aggravating factors: unknown Relieving factors: cream, tylenol, heat  PRECAUTIONS: Fall  WEIGHT BEARING RESTRICTIONS: No  FALLS:  Has patient fallen in last 6 months? Yes. Number of falls 2-3 per month  LIVING ENVIRONMENT: Lives with: lives alone Lives in: House/apartment Stairs: Yes: External: 2 steps; on right going up and on left going up Has following equipment at home: Single point cane  OCCUPATION: retired  PLOF: Independent with community mobility with device  PATIENT GOALS: Improve neck ROM, improve strength, reduce pain, improve balance, reduced falls.  NEXT MD VISIT: approx 2 mo  OBJECTIVE:   DIAGNOSTIC FINDINGS:  None recently  PATIENT SURVEYS:  FOTO 39  COGNITION: Overall cognitive status: Within functional limits for tasks assessed  SENSATION: WFL  POSTURE: rounded shoulders, forward head, and increased thoracic kyphosis  PALPATION: TTP Lt upper trap and cervical paraspinals   CERVICAL ROM:   Active ROM A/PROM (deg) eval  Flexion 50  Extension 26 P!  Right lateral flexion 12 P!  Left lateral flexion 12  Right rotation 32  Left rotation 33   (Blank rows = not tested)  UPPER EXTREMITY ROM: N/T  UPPER EXTREMITY MMT:  MMT Right eval Left eval  Shoulder flexion 5 5  Shoulder extension 5 5  4+ 4+ 5  Shoulder adduction    Shoulder extension    Shoulder internal rotation 5 5  Shoulder external rotation 4+ 5  Middle trapezius    Lower trapezius    Elbow flexion 5 5  Elbow extension 5 5  Wrist flexion 5 5  Wrist extension 5 4  Wrist ulnar deviation    Wrist radial deviation    Wrist pronation    Wrist supination    Grip strength     (Blank rows =  not tested)  CERVICAL SPECIAL TESTS:  Spurling's test: Positive and  Distraction test: Positive  FUNCTIONAL TESTS:  5 times sit to stand: 13.5 Berg Balance Scale: 43  BERG  1. SITTING TO STANDING  4 able to stand without using hands and stabilize independently  2. STANDING UNSUPPORTED  4 able to stand safely for 2 minutes  If a subject is able to stand 2 minutes unsupported, score full points for sitting unsupported. Proceed to item #4  3. SITTING WITH BACK UNSUPPORTED BUT FEET SUPPORTED ON FLOOR OR ON A STOOL 4 able to sit safely and securely for 2 minutes  4. STANDING TO SITTING 4 sits safely with minimal use of hands  5. TRANSFERS 3 able to transfer safely definite need of hands  6. STANDING UNSUPPORTED WITH EYES CLOSED  3 able to stand 10 seconds with supervision  7. STANDING UNSUPPORTED WITH FEET TOGETHER  3 able to place feet together independently and stand 1 minute with supervision  8. REACHING FORWARD WITH OUTSTRETCHED ARM WHILE STANDING  3 can reach forward 12 cm (5 inches)  9. PICK UP OBJECT FROM THE FLOOR FROM A STANDING POSITION  3 able to pick up slipper but needs supervision  10. TURNING TO LOOK BEHIND OVER LEFT AND RIGHT SHOULDERS WHILE STANDING  3 looks behind one side only other side shows less weight shift  11. TURN 360 DEGREES 4 able to turn 360 degrees safely in 4 seconds or less  12. PLACE ALTERNATE FOOT ON STEP OR STOOL WHILE STANDING UNSUPPORTED  4 able to stand independently and safely and complete 8 steps in 20 seconds  13. STANDING UNSUPPORTED ONE FOOT IN FRONT 0 loses balance while stepping or standing  14. STANDING ON ONE LEG 1 tries to lift leg unable to hold 3 seconds but remains standing independently  TOTAL SCORE  43/56 History of falls and BBS < 51, or no history of falls and BBS < 42 is predictive of falls ([place], 1997         TODAY'S TREATMENT:                                                                                                                              DATE:   05/23/22 Late arrival Supine: Cervical retractions 2 x 10 Scapular retractions 2 x 10 Shoulder horizontal abduction 2 x 10 RTB Bridge 2 x 10 Bridge with hip adduction with ball 2 x 10    Sitting Scapular retraction 2 x 10  Sit to stand with OH reach 2 x 5 from mat table Standing wall push aways   05/21/22 Supine cervical retractions 3 x 10  Supine scapular retractions 2 x 10  Bridge 2 x 10  Supine shoulder flexion with GTB in hands 2x 10  STS with overhead reach 2 x 5  05/14/22 Seated:  3D cervical excursions 10X each Standing: GTB scap retractions 2X10  Rows GTB 2X10  Extensions GTB 2X10  UE flexion against wall 10X  UE flexion facing wall stretch 10X10" holds  05/07/22 Seated: 3D cervical excursions 10X each  Thoracic excursions with UE movements 5X each  Scapular retractions STanding:  tandem stance 3X30"  SLS attempts X 5 each  Hip abduction 10X each  Hip extension 10X each  Vectors 5X5" each with 1 UE assist  04/30/22 Seated:  3D cervical excursions 10X each  Thoracic excusions with UE movements 5X each  Scapular retraction sitting upright 10X5" holds Standing:  Heelraises 20X  Hip vectors 5X5" each LE with 1 HHA  04/26/22 Reviewed HEP and goals HEP updated with thoracic extension x10 Chin tuck + lateral flexion x10 Chin tuck + rotation x10 RTB bil shoulder ER seated 2x10 RTB seated row 2x10 RTB seated shoulder extension 2x10  Standing Heel/toe rock with back to wall for safety x10 Standing 3 way hip vectors x5 ea   04/15/22 Eval, 5x sit to stand, MMT, ROM HEP   PATIENT EDUCATION:  Education details: 12/12 HEP; EVAL findings, modalities, HEP, symptom awareness, safety, POC, PT role Person educated: Patient Education method: Explanation and Handouts Education comprehension: verbalized understanding  HOME EXERCISE PROGRAM: Access Code: [email or web address] URL: [email or web address] Date: 04/15/2022 Prepared by: Candie Mile Exercises - Seated Cervical Retraction  - 3 x daily - 7 x weekly - 2 sets - 10 reps - Seated Scapular Retraction  - 3 x daily - 7 x weekly - 2 sets - 10 reps - Seated Upper Trapezius Stretch  - 3 x daily - 7 x weekly - 2 sets - 30 sec hold  04/30/22:  vector stance 5X5" with 1 HHA  Access Code: [email or web address] URL: [email or web address] Date: 05/07/2022 Prepared by: Roseanne Reno Exercises - Tandem Stance with Support  - 2 x daily - 7 x weekly - 1 sets - 3 reps - 20 secs hold - Standing Hip Abduction  - 2 x daily - 7 x weekly - 1 sets - 10 reps - Standing Hip Extension  - 2 x daily - 7 x weekly - 1 sets - 10 reps  05/21/22 - Sit to Stand Without Arm Support  - 1 x daily - 7 x weekly - 3 sets - 5 reps  ASSESSMENT:  CLINICAL IMPRESSION: Session again limited by patient's late arrival. Began session with supine exercises and patient needs continued cues for proper form and technique with cervical retractions with good/fair carry over. Added wall push aways today without issue; patient still presents standing posture with cervical flexion; rounded shoulders; improved in supine and sitting positioning.  Patient will continue to benefit from physical therapy in order to improve function and reduce impairment.    OBJECTIVE IMPAIRMENTS: Abnormal gait, decreased activity tolerance, decreased balance, decreased knowledge of condition, decreased knowledge of use of DME, decreased mobility, difficulty walking, decreased ROM, decreased strength, hypomobility, increased fascial restrictions, impaired flexibility, postural dysfunction, and pain.   ACTIVITY LIMITATIONS: carrying, lifting, squatting, stairs, reach over head, and locomotion level  PARTICIPATION LIMITATIONS: cleaning, laundry, driving, shopping, and community activity  PERSONAL FACTORS: Age and Time since onset of injury/illness/exacerbation are also affecting patient's functional outcome.   REHAB POTENTIAL:  Excellent  CLINICAL DECISION MAKING: Stable/uncomplicated  EVALUATION COMPLEXITY: Low   GOALS: Goals reviewed with patient? Yes  SHORT TERM GOALS: Target date: 05/06/22  Patient will be independent with initial HEP and self-management strategies to improve functional outcomes Baseline: Initiated Goal status: IN PROGRESS    LONG TERM GOALS: Target date: 05/27/22  Patient will be independent with  advanced HEP and self-management strategies to improve functional outcomes Baseline:  Goal status: IN PROGRESS  2.  Patient will improve FOTO score to predicted value of 51 to indicate improvement in functional outcomes Baseline: 39 Goal status: IN PROGRESS  3.  Patient will demo improved combined cervical rotation by 10 degrees total  in order to improve ability to scan environment for safety and while driving. Baseline: see above Goal status: IN PROGRESS  4. Patient will report a decrease in neck pain to no more than 3/10 for improved quality of life and ability to perform UE ADLs  Baseline: 6/10 Goal status: IN PROGRESS     5. Patient will improve BERG balance score >to 51 points or greater to show low fall risk based on population that has hx of falls.    Baseline 43/56    Goal status: IN PROGRESS  PLAN:  PT FREQUENCY: 2x/week  PT DURATION: 6 weeks  PLANNED INTERVENTIONS: Therapeutic exercises, Therapeutic activity, Neuromuscular re-education, Balance training, Gait training, Patient/Family education, Self Care, Joint mobilization, Joint manipulation, Stair training, DME instructions, Dry Needling, Electrical stimulation, Spinal manipulation, Spinal mobilization, Cryotherapy, Moist heat, Taping, Traction, Ultrasound, Biofeedback, Ionotophoresis 64m/ml Dexamethasone, Manual therapy, and Re-evaluation  PLAN FOR NEXT SESSION: Cervical ROM, upper quarter strengthening and postural training. Balance training, gait training, fall risk reduction.    1:00 PM, 05/23/22 Lynea Rollison Small  Haydon Kalmar MPT [facility] [place] #[phone]

## 2022-05-27 ENCOUNTER — Encounter (HOSPITAL_COMMUNITY): Payer: Medicare Other

## 2022-05-29 ENCOUNTER — Encounter (HOSPITAL_COMMUNITY): Payer: Medicare Other | Admitting: Physical Therapy

## 2022-05-31 ENCOUNTER — Ambulatory Visit (HOSPITAL_COMMUNITY): Payer: Medicare Other

## 2022-05-31 DIAGNOSIS — R262 Difficulty in walking, not elsewhere classified: Secondary | ICD-10-CM

## 2022-05-31 DIAGNOSIS — M542 Cervicalgia: Secondary | ICD-10-CM

## 2022-05-31 DIAGNOSIS — M6281 Muscle weakness (generalized): Secondary | ICD-10-CM

## 2022-05-31 DIAGNOSIS — R2689 Other abnormalities of gait and mobility: Secondary | ICD-10-CM

## 2022-05-31 DIAGNOSIS — R296 Repeated falls: Secondary | ICD-10-CM

## 2022-05-31 NOTE — Therapy (Signed)
OUTPATIENT PHYSICAL THERAPY TREATMENT   Patient Name: [name] MRN: [id number] DOB:1934-04-15, 88 y.o., female Today's Date: 05/31/2022   PT End of Session - 05/31/22 1128     Visit Number 8    Number of Visits 12    Date for PT Re-Evaluation 07/22/22    Authorization Type [employer] Medicare    Authorization Time Period "no Auth, no VL"    Progress Note Due on Visit 10    PT Start Time 1126    PT Stop Time 1200    PT Time Calculation (min) 34 min    Activity Tolerance Patient tolerated treatment well    Behavior During Therapy WFL for tasks assessed/performed             Past Medical History:  Diagnosis Date   Ankle swelling    takes torsemide prn   Anxiety    Arthritis    Asthma    allergy induced   Back pain    Complication of anesthesia    COVID 2023   Depression    GERD (gastroesophageal reflux disease)    HOH (hard of hearing)    Hypercholesterolemia    Hypothyroid    Parkinson disease    PONV (postoperative nausea and vomiting)    Seasonal allergies    Past Surgical History:  Procedure Laterality Date   ABDOMINAL HYSTERECTOMY     CARPAL TUNNEL RELEASE Right 10/26/2015   Procedure: CARPAL TUNNEL RELEASE;  Surgeon: Daryll Brod, MD;  Location: [facility];  Service: Orthopedics;  Laterality: Right;   CARPOMETACARPEL SUSPENSION PLASTY Right 10/26/2015   Procedure: RIGHT HAND TRAPEZIECTOMY WITH THUMB METACARPEL SUSPENSION PLASTY;  Surgeon: Daryll Brod, MD;  Location: [facility];  Service: Orthopedics;  Laterality: Right;   CATARACT EXTRACTION W/PHACO  12/23/2011   Procedure: CATARACT EXTRACTION PHACO AND INTRAOCULAR LENS PLACEMENT (IOC);  Surgeon: Williams Che, MD;  Location: [facility];  Service: Ophthalmology;  Laterality: Right;  CDE=5.16   CHOLECYSTECTOMY     EYE SURGERY     cataract extraction of left eye-[facility]-2002   KNEE ARTHROSCOPY     right   NISSEN FUNDOPLICATION     [facility]   TRIGGER FINGER RELEASE Right 10/26/2015    Procedure: RELEASE A-1 PULLEY RIGHT RING FINGER;  Surgeon: Daryll Brod, MD;  Location: [facility];  Service: Orthopedics;  Laterality: Right;   Patient Active Problem List   Diagnosis Date Noted   Cellulitis 12/11/2021   Cellulitis of right hand 12/10/2021   Hypocalcemia 12/10/2021   Normocytic anemia 12/10/2021   Depression 12/10/2021   Hypothyroidism 12/10/2021   Edema of left lower extremity 07/10/2021   GERD (gastroesophageal reflux disease)    History of Nissen fundoplication    IBS (irritable bowel syndrome) 02/05/2018   Diarrhea 06/16/2017   N&V (nausea and vomiting) 06/16/2017   Numbness 08/18/2015   Polyneuropathy 08/18/2015   Menopausal syndrome (hot flushes) 06/15/2015   Tremor, essential 06/15/2015   Gastroesophageal reflux disease with esophagitis 06/15/2015   Bilateral carpal tunnel syndrome 06/15/2015   Resting tremor 06/15/2015   Scoliosis of thoracic spine 11/05/2013   Spinal stenosis, unspecified region other than cervical 12/03/2012   Spondylosis of unspecified site without mention of myelopathy 12/03/2012   Parkinson disease ([place])    Back pain    Seasonal allergies     PCP: Leslie Andrea, MD  REFERRING PROVIDER: Ward Givens, NP  REFERRING DIAG: G20.A2 (ICD-10-CM) - Parkinson's disease without dyskinesia, with fluctuating manifestations M54.2 (ICD-10-CM) -  Neck pain  THERAPY DIAG:  Cervicalgia  Difficulty in walking, not elsewhere classified  Muscle weakness (generalized)  Repeated falls  Other abnormalities of gait and mobility  Rationale for Evaluation and Treatment: Rehabilitation  ONSET DATE: Neck pain several months ago. Falls have been on going, 2-3x per month. SUBJECTIVE:                                                                                                                                                                                                         SUBJECTIVE STATEMENT: Pt was again late for appt   today. She is "barely making it" today; " I think I'm just so tired"; she states she has been so busy; she dropped a big bowl on her ceramic cook top at home and it shattered.  She's just been busy with trying to prepare for Christmas. Had to buy a hot plate to cook on.  Her back is hurting her today and her neck is still sore. Took some Naproxen before appointment today PERTINENT HISTORY:  Parkinson's disease - 1970  PAIN:  Are you having pain? Yes: NPRS scale: 3-6/10 Pain location: back, neck, Lt shoulder Pain description: sharp Aggravating factors: unknown Relieving factors: cream, tylenol, heat  PRECAUTIONS: Fall  WEIGHT BEARING RESTRICTIONS: No  FALLS:  Has patient fallen in last 6 months? Yes. Number of falls 2-3 per month  LIVING ENVIRONMENT: Lives with: lives alone Lives in: House/apartment Stairs: Yes: External: 2 steps; on right going up and on left going up Has following equipment at home: Single point cane  OCCUPATION: retired  PLOF: Independent with community mobility with device  PATIENT GOALS: Improve neck ROM, improve strength, reduce pain, improve balance, reduced falls.  NEXT MD VISIT: approx 2 mo  OBJECTIVE:   DIAGNOSTIC FINDINGS:  None recently  PATIENT SURVEYS:  FOTO 39  COGNITION: Overall cognitive status: Within functional limits for tasks assessed  SENSATION: WFL  POSTURE: rounded shoulders, forward head, and increased thoracic kyphosis  PALPATION: TTP Lt upper trap and cervical paraspinals   CERVICAL ROM:   Active ROM A/PROM (deg) eval  Flexion 50  Extension 26 P!  Right lateral flexion 12 P!  Left lateral flexion 12  Right rotation 32  Left rotation 33   (Blank rows = not tested)  UPPER EXTREMITY ROM: N/T  UPPER EXTREMITY MMT:  MMT Right eval Left eval  Shoulder flexion 5 5  Shoulder extension 5 5  4+ 4+ 5  Shoulder adduction    Shoulder extension    Shoulder internal rotation 5 5  Shoulder  external rotation 4+ 5   Middle trapezius    Lower trapezius    Elbow flexion 5 5  Elbow extension 5 5  Wrist flexion 5 5  Wrist extension 5 4  Wrist ulnar deviation    Wrist radial deviation    Wrist pronation    Wrist supination    Grip strength     (Blank rows = not tested)  CERVICAL SPECIAL TESTS:  Spurling's test: Positive and Distraction test: Positive  FUNCTIONAL TESTS:  5 times sit to stand: 13.5 Berg Balance Scale: 43  BERG  1. SITTING TO STANDING  4 able to stand without using hands and stabilize independently  2. STANDING UNSUPPORTED  4 able to stand safely for 2 minutes  If a subject is able to stand 2 minutes unsupported, score full points for sitting unsupported. Proceed to item #4  3. SITTING WITH BACK UNSUPPORTED BUT FEET SUPPORTED ON FLOOR OR ON A STOOL 4 able to sit safely and securely for 2 minutes  4. STANDING TO SITTING 4 sits safely with minimal use of hands  5. TRANSFERS 3 able to transfer safely definite need of hands  6. STANDING UNSUPPORTED WITH EYES CLOSED  3 able to stand 10 seconds with supervision  7. STANDING UNSUPPORTED WITH FEET TOGETHER  3 able to place feet together independently and stand 1 minute with supervision  8. REACHING FORWARD WITH OUTSTRETCHED ARM WHILE STANDING  3 can reach forward 12 cm (5 inches)  9. PICK UP OBJECT FROM THE FLOOR FROM A STANDING POSITION  3 able to pick up slipper but needs supervision  10. TURNING TO LOOK BEHIND OVER LEFT AND RIGHT SHOULDERS WHILE STANDING  3 looks behind one side only other side shows less weight shift  11. TURN 360 DEGREES 4 able to turn 360 degrees safely in 4 seconds or less  12. PLACE ALTERNATE FOOT ON STEP OR STOOL WHILE STANDING UNSUPPORTED  4 able to stand independently and safely and complete 8 steps in 20 seconds  13. STANDING UNSUPPORTED ONE FOOT IN FRONT 0 loses balance while stepping or standing  14. STANDING ON ONE LEG 1 tries to lift leg unable to hold 3 seconds but remains  standing independently  TOTAL SCORE  43/56 History of falls and BBS < 51, or no history of falls and BBS < 42 is predictive of falls ([place], 1997         TODAY'S TREATMENT:                                                                                                                              DATE:  05/31/22 Late arrival Supine: LTR x 10 Abdominal bracing 5" hold x 10 Active hamstring stretch 5 x 20" each Scapular retractions 5" hold x 10 Cervical retractions 5" hold x 10  Sitting: Thoracic extensions over chair x 15  Standing: Tandem stance 2 x 30" each Hip vectors 5" x 5 each  05/23/22 Late arrival Supine: Cervical retractions 2 x 10 Scapular retractions 2 x 10 Shoulder horizontal abduction 2 x 10 RTB Bridge 2 x 10 Bridge with hip adduction with ball 2 x 10    Sitting Scapular retraction 2 x 10  Sit to stand with OH reach 2 x 5 from mat table Standing wall push aways   05/21/22 Supine cervical retractions 3 x 10  Supine scapular retractions 2 x 10  Bridge 2 x 10  Supine shoulder flexion with GTB in hands 2x 10  STS with overhead reach 2 x 5  05/14/22 Seated:  3D cervical excursions 10X each Standing: GTB scap retractions 2X10  Rows GTB 2X10  Extensions GTB 2X10  UE flexion against wall 10X  UE flexion facing wall stretch 10X10" holds  05/07/22 Seated: 3D cervical excursions 10X each  Thoracic excursions with UE movements 5X each  Scapular retractions STanding:  tandem stance 3X30"  SLS attempts X 5 each  Hip abduction 10X each  Hip extension 10X each  Vectors 5X5" each with 1 UE assist  04/30/22 Seated:  3D cervical excursions 10X each  Thoracic excusions with UE movements 5X each  Scapular retraction sitting upright 10X5" holds Standing:  Heelraises 20X  Hip vectors 5X5" each LE with 1 HHA  04/26/22 Reviewed HEP and goals HEP updated with thoracic extension x10 Chin tuck + lateral flexion x10 Chin tuck + rotation  x10 RTB bil shoulder ER seated 2x10 RTB seated row 2x10 RTB seated shoulder extension 2x10  Standing Heel/toe rock with back to wall for safety x10 Standing 3 way hip vectors x5 ea   04/15/22 Eval, 5x sit to stand, MMT, ROM HEP   PATIENT EDUCATION:  Education details: 12/12 HEP; EVAL findings, modalities, HEP, symptom awareness, safety, POC, PT role Person educated: Patient Education method: Explanation and Handouts Education comprehension: verbalized understanding  HOME EXERCISE PROGRAM: Access Code: [email or web address] URL: [email or web address] Date: 05/31/2022 Prepared by: [facility]  Exercises - Seated Cervical Retraction  - 3 x daily - 7 x weekly - 2 sets - 10 reps - Seated Scapular Retraction  - 3 x daily - 7 x weekly - 2 sets - 10 reps - Seated Upper Trapezius Stretch  - 3 x daily - 7 x weekly - 2 sets - 30 sec hold - Seated Thoracic Extension with Hands Behind Neck  - 3 x daily - 7 x weekly - 1 sets - 10 reps - Tandem Stance with Support  - 2 x daily - 7 x weekly - 1 sets - 3 reps - 20 secs hold - Standing Hip Abduction  - 2 x daily - 7 x weekly - 1 sets - 10 reps - Standing Hip Extension  - 2 x daily - 7 x weekly - 1 sets - 10 reps - Sit to Stand Without Arm Support  - 1 x daily - 7 x weekly - 3 sets - 5 reps - Supine Lower Trunk Rotation  - 2 x daily - 7 x weekly - 1 sets - 10 reps - Supine Bridge  - 2 x daily - 7 x weekly - 1 sets - 10 reps - Supine Transversus Abdominis Bracing - Hands on Stomach  - 2 x daily - 7 x weekly - 1 sets - 10 reps - Supine Hamstring Stretch  - 2 x daily - 7 x weekly - 1 sets - 10 reps  Access Code: [email or web address] URL: [email or web address] Date: 04/15/2022 Prepared by: Candie Mile Exercises - Seated Cervical  Retraction  - 3 x daily - 7 x weekly - 2 sets - 10 reps - Seated Scapular Retraction  - 3 x daily - 7 x weekly - 2 sets - 10 reps - Seated Upper Trapezius Stretch  - 3 x daily - 7 x weekly - 2 sets - 30 sec  hold  04/30/22:  vector stance 5X5" with 1 HHA  Access Code: [email or web address] URL: [email or web address] Date: 05/07/2022 Prepared by: Roseanne Reno Exercises - Tandem Stance with Support  - 2 x daily - 7 x weekly - 1 sets - 3 reps - 20 secs hold - Standing Hip Abduction  - 2 x daily - 7 x weekly - 1 sets - 10 reps - Standing Hip Extension  - 2 x daily - 7 x weekly - 1 sets - 10 reps  05/21/22 - Sit to Stand Without Arm Support  - 1 x daily - 7 x weekly - 3 sets - 5 reps  ASSESSMENT:  CLINICAL IMPRESSION: Session again limited by patient's late arrival. Using a cane on arrival today.  States she uses one when she feels unsteady.  Started with some lumbar mobility in supine to try to decrease patients back pain. Decreased back pain after session; still challenged with tandem stance.  Updated HEP.   Continues with rounded shoulders and forward head.  Patient will continue to benefit from physical therapy in order to improve function and reduce impairment.    OBJECTIVE IMPAIRMENTS: Abnormal gait, decreased activity tolerance, decreased balance, decreased knowledge of condition, decreased knowledge of use of DME, decreased mobility, difficulty walking, decreased ROM, decreased strength, hypomobility, increased fascial restrictions, impaired flexibility, postural dysfunction, and pain.   ACTIVITY LIMITATIONS: carrying, lifting, squatting, stairs, reach over head, and locomotion level  PARTICIPATION LIMITATIONS: cleaning, laundry, driving, shopping, and community activity  PERSONAL FACTORS: Age and Time since onset of injury/illness/exacerbation are also affecting patient's functional outcome.   REHAB POTENTIAL: Excellent  CLINICAL DECISION MAKING: Stable/uncomplicated  EVALUATION COMPLEXITY: Low   GOALS: Goals reviewed with patient? Yes  SHORT TERM GOALS: Target date: 05/06/22  Patient will be independent with initial HEP and self-management strategies to improve functional  outcomes Baseline: Initiated Goal status: IN PROGRESS    LONG TERM GOALS: Target date: 05/27/22  Patient will be independent with advanced HEP and self-management strategies to improve functional outcomes Baseline:  Goal status: IN PROGRESS  2.  Patient will improve FOTO score to predicted value of 51 to indicate improvement in functional outcomes Baseline: 39 Goal status: IN PROGRESS  3.  Patient will demo improved combined cervical rotation by 10 degrees total  in order to improve ability to scan environment for safety and while driving. Baseline: see above Goal status: IN PROGRESS  4. Patient will report a decrease in neck pain to no more than 3/10 for improved quality of life and ability to perform UE ADLs  Baseline: 6/10 Goal status: IN PROGRESS     5. Patient will improve BERG balance score >to 51 points or greater to show low fall risk based on population that has hx of falls.    Baseline 43/56    Goal status: IN PROGRESS  PLAN:  PT FREQUENCY: 2x/week  PT DURATION: 6 weeks  PLANNED INTERVENTIONS: Therapeutic exercises, Therapeutic activity, Neuromuscular re-education, Balance training, Gait training, Patient/Family education, Self Care, Joint mobilization, Joint manipulation, Stair training, DME instructions, Dry Needling, Electrical stimulation, Spinal manipulation, Spinal mobilization, Cryotherapy, Moist heat, Taping, Traction, Ultrasound, Biofeedback, Ionotophoresis 49m/ml Dexamethasone, Manual therapy, and Re-evaluation  PLAN FOR NEXT SESSION: Cervical ROM, upper quarter strengthening and postural training. Balance training, gait training, fall risk reduction.    11:29 AM, 05/31/22 Aly Seidenberg Small Aniruddh Ciavarella MPT [facility] [place] [phone]

## 2022-06-05 ENCOUNTER — Encounter (HOSPITAL_COMMUNITY): Payer: Medicare Other

## 2022-06-18 ENCOUNTER — Encounter (HOSPITAL_COMMUNITY): Payer: Medicare Other

## 2022-06-21 ENCOUNTER — Ambulatory Visit (HOSPITAL_COMMUNITY): Payer: Medicare Other | Attending: Adult Health

## 2022-06-21 DIAGNOSIS — R262 Difficulty in walking, not elsewhere classified: Secondary | ICD-10-CM | POA: Insufficient documentation

## 2022-06-21 DIAGNOSIS — M542 Cervicalgia: Secondary | ICD-10-CM | POA: Diagnosis present

## 2022-06-21 DIAGNOSIS — M6281 Muscle weakness (generalized): Secondary | ICD-10-CM | POA: Insufficient documentation

## 2022-06-21 NOTE — Therapy (Signed)
OUTPATIENT PHYSICAL THERAPY DISCHARGE VISIT PHYSICAL THERAPY DISCHARGE SUMMARY  Visits from Start of Care: 9  Current functional level related to goals / functional outcomes: See below   Remaining deficits: See below   Education / Equipment: See below   Patient agrees to discharge. Patient goals were met. Patient is being discharged due to meeting the stated rehab goals.    Patient Name: [name] MRN: [id number] DOB:1934/01/24, 89 y.o., female Today's Date: 06/21/2022   PT End of Session - 06/21/22 1048     Visit Number 9    Number of Visits 12    Date for PT Re-Evaluation 07/22/22    Authorization Type [employer] Medicare    Authorization Time Period "no Auth, no VL"    Progress Note Due on Visit 10    PT Start Time 1044    PT Stop Time 1114    PT Time Calculation (min) 30 min    Activity Tolerance Patient tolerated treatment well    Behavior During Therapy WFL for tasks assessed/performed             Past Medical History:  Diagnosis Date   Ankle swelling    takes torsemide prn   Anxiety    Arthritis    Asthma    allergy induced   Back pain    Complication of anesthesia    COVID 2023   Depression    GERD (gastroesophageal reflux disease)    HOH (hard of hearing)    Hypercholesterolemia    Hypothyroid    Parkinson disease    PONV (postoperative nausea and vomiting)    Seasonal allergies    Past Surgical History:  Procedure Laterality Date   ABDOMINAL HYSTERECTOMY     CARPAL TUNNEL RELEASE Right 10/26/2015   Procedure: CARPAL TUNNEL RELEASE;  Surgeon: Daryll Brod, MD;  Location: [place];  Service: Orthopedics;  Laterality: Right;   CARPOMETACARPEL SUSPENSION PLASTY Right 10/26/2015   Procedure: RIGHT HAND TRAPEZIECTOMY WITH THUMB METACARPEL SUSPENSION PLASTY;  Surgeon: Daryll Brod, MD;  Location: [place];  Service: Orthopedics;  Laterality: Right;   CATARACT EXTRACTION W/PHACO  12/23/2011   Procedure: CATARACT  EXTRACTION PHACO AND INTRAOCULAR LENS PLACEMENT (IOC);  Surgeon: Williams Che, MD;  Location: [facility];  Service: Ophthalmology;  Laterality: Right;  CDE=5.16   CHOLECYSTECTOMY     EYE SURGERY     cataract extraction of left eye-[facility]-2002   KNEE ARTHROSCOPY     right   NISSEN FUNDOPLICATION     [facility]   TRIGGER FINGER RELEASE Right 10/26/2015   Procedure: RELEASE A-1 PULLEY RIGHT RING FINGER;  Surgeon: Daryll Brod, MD;  Location: [place];  Service: Orthopedics;  Laterality: Right;   Patient Active Problem List   Diagnosis Date Noted   Cellulitis 12/11/2021   Cellulitis of right hand 12/10/2021   Hypocalcemia 12/10/2021   Normocytic anemia 12/10/2021   Depression 12/10/2021   Hypothyroidism 12/10/2021   Edema of left lower extremity 07/10/2021   GERD (gastroesophageal reflux disease)    History of Nissen fundoplication    IBS (irritable bowel syndrome) 02/05/2018   Diarrhea 06/16/2017   N&V (nausea and vomiting) 06/16/2017   Numbness 08/18/2015   Polyneuropathy 08/18/2015   Menopausal syndrome (hot flushes) 06/15/2015   Tremor, essential 06/15/2015   Gastroesophageal reflux disease with esophagitis 06/15/2015   Bilateral carpal tunnel syndrome 06/15/2015   Resting tremor 06/15/2015   Scoliosis of thoracic spine 11/05/2013   Spinal stenosis, unspecified region other  than cervical 12/03/2012   Spondylosis of unspecified site without mention of myelopathy 12/03/2012   Parkinson disease (HCC)    Back pain    Seasonal allergies     PCP: John Giovanni, MD  REFERRING PROVIDER: Butch Penny, NP  REFERRING DIAG: G20.A2 (ICD-10-CM) - Parkinson's disease without dyskinesia, with fluctuating manifestations M54.2 (ICD-10-CM) - Neck pain  THERAPY DIAG:  Cervicalgia  Difficulty in walking, not elsewhere classified  Muscle weakness (generalized)  Rationale for Evaluation and Treatment: Rehabilitation  ONSET DATE: Neck pain several months ago. Falls have been  on going, 2-3x per month. SUBJECTIVE:                                                                                                                                                                                                         SUBJECTIVE STATEMENT: "About 60% better" overall; moving better PERTINENT HISTORY:  Parkinson's disease - 1970  PAIN:  Are you having pain? Yes: NPRS scale: 3/10 Pain location: back, neck, Lt shoulder Pain description: sharp Aggravating factors: unknown Relieving factors: cream, tylenol, heat  PRECAUTIONS: Fall  WEIGHT BEARING RESTRICTIONS: No  FALLS:  Has patient fallen in last 6 months? Yes. Number of falls 2-3 per month  LIVING ENVIRONMENT: Lives with: lives alone Lives in: House/apartment Stairs: Yes: External: 2 steps; on right going up and on left going up Has following equipment at home: Single point cane  OCCUPATION: retired  PLOF: Independent with community mobility with device  PATIENT GOALS: Improve neck ROM, improve strength, reduce pain, improve balance, reduced falls.  NEXT MD VISIT: approx 2 mo  OBJECTIVE:   DIAGNOSTIC FINDINGS:  None recently  PATIENT SURVEYS:  FOTO 39  COGNITION: Overall cognitive status: Within functional limits for tasks assessed  SENSATION: WFL  POSTURE: rounded shoulders, forward head, and increased thoracic kyphosis  PALPATION: TTP Lt upper trap and cervical paraspinals   CERVICAL ROM:   Active ROM A/PROM (deg) eval AROM 06/21/22  Flexion 50 full  Extension 26 P! 40  Right lateral flexion 12 P! 26  Left lateral flexion 12 28  Right rotation 32 48  Left rotation 33 61   (Blank rows = not tested)  UPPER EXTREMITY ROM: N/T  UPPER EXTREMITY MMT:  MMT Right eval Left eval  Shoulder flexion 5 5  Shoulder extension 5 5  4+ 4+ 5  Shoulder adduction    Shoulder extension    Shoulder internal rotation 5 5  Shoulder external rotation 4+ 5  Middle trapezius    Lower trapezius     Elbow flexion 5  5  Elbow extension 5 5  Wrist flexion 5 5  Wrist extension 5 4  Wrist ulnar deviation    Wrist radial deviation    Wrist pronation    Wrist supination    Grip strength     (Blank rows = not tested)  CERVICAL SPECIAL TESTS:  Spurling's test: Positive and Distraction test: Positive  FUNCTIONAL TESTS:  5 times sit to stand: 13.5 Berg Balance Scale: 43  BERG  1. SITTING TO STANDING  4 able to stand without using hands and stabilize independently  2. STANDING UNSUPPORTED  4 able to stand safely for 2 minutes  If a subject is able to stand 2 minutes unsupported, score full points for sitting unsupported. Proceed to item #4  3. SITTING WITH BACK UNSUPPORTED BUT FEET SUPPORTED ON FLOOR OR ON A STOOL 4 able to sit safely and securely for 2 minutes  4. STANDING TO SITTING 4 sits safely with minimal use of hands  5. TRANSFERS 3 able to transfer safely definite need of hands  6. STANDING UNSUPPORTED WITH EYES CLOSED  3 able to stand 10 seconds with supervision  7. STANDING UNSUPPORTED WITH FEET TOGETHER  3 able to place feet together independently and stand 1 minute with supervision  8. REACHING FORWARD WITH OUTSTRETCHED ARM WHILE STANDING  3 can reach forward 12 cm (5 inches)  9. PICK UP OBJECT FROM THE FLOOR FROM A STANDING POSITION  3 able to pick up slipper but needs supervision  10. TURNING TO LOOK BEHIND OVER LEFT AND RIGHT SHOULDERS WHILE STANDING  3 looks behind one side only other side shows less weight shift  11. TURN 360 DEGREES 4 able to turn 360 degrees safely in 4 seconds or less  12. PLACE ALTERNATE FOOT ON STEP OR STOOL WHILE STANDING UNSUPPORTED  4 able to stand independently and safely and complete 8 steps in 20 seconds  13. STANDING UNSUPPORTED ONE FOOT IN FRONT 0 loses balance while stepping or standing  14. STANDING ON ONE LEG 1 tries to lift leg unable to hold 3 seconds but remains standing independently  TOTAL SCORE   43/56 History of falls and BBS < 51, or no history of falls and BBS < 42 is predictive of falls ([place]-Cook, 1997         TODAY'S TREATMENT:                                                                                                                              DATE:  06/21/22 FOTO 55 5 x sit to stand 10.88 sec AROM of cervical spine see above BERG  1. SITTING TO STANDING  4 able to stand without using hands and stabilize independently  2. STANDING UNSUPPORTED  4 able to stand safely for 2 minutes  If a subject is able to stand 2 minutes unsupported, score full points for sitting unsupported. Proceed to item #4  3. SITTING WITH BACK UNSUPPORTED BUT FEET  SUPPORTED ON FLOOR OR ON A STOOL 4 able to sit safely and securely for 2 minutes  4. STANDING TO SITTING 4 sits safely with minimal use of hands  5. TRANSFERS 4 able to transfer safely with minor use of hands  6. STANDING UNSUPPORTED WITH EYES CLOSED 4 able to stand 10 seconds safely   7. STANDING UNSUPPORTED WITH FEET TOGETHER  3 able to place feet together independently and stand 1 minute with supervision  8. REACHING FORWARD WITH OUTSTRETCHED ARM WHILE STANDING  3 can reach forward 12 cm (5 inches)  9. PICK UP OBJECT FROM THE FLOOR FROM A STANDING POSITION  3 able to pick up slipper but needs supervision  10. TURNING TO LOOK BEHIND OVER LEFT AND RIGHT SHOULDERS WHILE STANDING  4 looks behind from both sides and weight shifts well  11. TURN 360 DEGREES 4 able to turn 360 degrees safely in 4 seconds or less  12. PLACE ALTERNATE FOOT ON STEP OR STOOL WHILE STANDING UNSUPPORTED  4 able to stand independently and safely and complete 8 steps in 20 seconds  13. STANDING UNSUPPORTED ONE FOOT IN FRONT 4 able to place foot tandem independently and hold 30 seconds  14. STANDING ON ONE LEG 2 able to lift leg independently and hold = 3 seconds  TOTAL SCORE  51/56 41-56 = independent            05/31/22 Late arrival Supine: LTR x 10 Abdominal bracing 5" hold x 10 Active hamstring stretch 5 x 20" each Scapular retractions 5" hold x 10 Cervical retractions 5" hold x 10  Sitting: Thoracic extensions over chair x 15  Standing: Tandem stance 2 x 30" each Hip vectors 5" x 5 each     05/23/22 Late arrival Supine: Cervical retractions 2 x 10 Scapular retractions 2 x 10 Shoulder horizontal abduction 2 x 10 RTB Bridge 2 x 10 Bridge with hip adduction with ball 2 x 10    Sitting Scapular retraction 2 x 10  Sit to stand with OH reach 2 x 5 from mat table Standing wall push aways   05/21/22 Supine cervical retractions 3 x 10  Supine scapular retractions 2 x 10  Bridge 2 x 10  Supine shoulder flexion with GTB in hands 2x 10  STS with overhead reach 2 x 5  05/14/22 Seated:  3D cervical excursions 10X each Standing: GTB scap retractions 2X10  Rows GTB 2X10  Extensions GTB 2X10  UE flexion against wall 10X  UE flexion facing wall stretch 10X10" holds  05/07/22 Seated: 3D cervical excursions 10X each  Thoracic excursions with UE movements 5X each  Scapular retractions STanding:  tandem stance 3X30"  SLS attempts X 5 each  Hip abduction 10X each  Hip extension 10X each  Vectors 5X5" each with 1 UE assist  04/30/22 Seated:  3D cervical excursions 10X each  Thoracic excusions with UE movements 5X each  Scapular retraction sitting upright 10X5" holds Standing:  Heelraises 20X  Hip vectors 5X5" each LE with 1 HHA  04/26/22 Reviewed HEP and goals HEP updated with thoracic extension x10 Chin tuck + lateral flexion x10 Chin tuck + rotation x10 RTB bil shoulder ER seated 2x10 RTB seated row 2x10 RTB seated shoulder extension 2x10  Standing Heel/toe rock with back to wall for safety x10 Standing 3 way hip vectors x5 ea   04/15/22 Eval, 5x sit to stand, MMT, ROM HEP   PATIENT EDUCATION:  Education details: 12/12 HEP; EVAL findings,  modalities,  HEP, symptom awareness, safety, POC, PT role Person educated: Patient Education method: Explanation and Handouts Education comprehension: verbalized understanding  HOME EXERCISE PROGRAM: Access Code: [email or web address] URL: [email or web address] Date: 05/31/2022 Prepared by: [facility]  Exercises - Seated Cervical Retraction  - 3 x daily - 7 x weekly - 2 sets - 10 reps - Seated Scapular Retraction  - 3 x daily - 7 x weekly - 2 sets - 10 reps - Seated Upper Trapezius Stretch  - 3 x daily - 7 x weekly - 2 sets - 30 sec hold - Seated Thoracic Extension with Hands Behind Neck  - 3 x daily - 7 x weekly - 1 sets - 10 reps - Tandem Stance with Support  - 2 x daily - 7 x weekly - 1 sets - 3 reps - 20 secs hold - Standing Hip Abduction  - 2 x daily - 7 x weekly - 1 sets - 10 reps - Standing Hip Extension  - 2 x daily - 7 x weekly - 1 sets - 10 reps - Sit to Stand Without Arm Support  - 1 x daily - 7 x weekly - 3 sets - 5 reps - Supine Lower Trunk Rotation  - 2 x daily - 7 x weekly - 1 sets - 10 reps - Supine Bridge  - 2 x daily - 7 x weekly - 1 sets - 10 reps - Supine Transversus Abdominis Bracing - Hands on Stomach  - 2 x daily - 7 x weekly - 1 sets - 10 reps - Supine Hamstring Stretch  - 2 x daily - 7 x weekly - 1 sets - 10 reps  Access Code: [email or web address] URL: [email or web address] Date: 04/15/2022 Prepared by: Candie Mile Exercises - Seated Cervical Retraction  - 3 x daily - 7 x weekly - 2 sets - 10 reps - Seated Scapular Retraction  - 3 x daily - 7 x weekly - 2 sets - 10 reps - Seated Upper Trapezius Stretch  - 3 x daily - 7 x weekly - 2 sets - 30 sec hold  04/30/22:  vector stance 5X5" with 1 HHA  Access Code: [email or web address] URL: [email or web address] Date: 05/07/2022 Prepared by: Roseanne Reno Exercises - Tandem Stance with Support  - 2 x daily - 7 x weekly - 1 sets - 3 reps - 20 secs hold - Standing Hip Abduction  - 2 x daily - 7 x weekly - 1  sets - 10 reps - Standing Hip Extension  - 2 x daily - 7 x weekly - 1 sets - 10 reps  05/21/22 - Sit to Stand Without Arm Support  - 1 x daily - 7 x weekly - 3 sets - 5 reps  ASSESSMENT:  CLINICAL IMPRESSION: Session again limited by patient's late arrival. Progress note today as patient has not been here in a couple weeks. Patient has met all set rehab goals.   Reviewed HEP with patient and she feels ready for discharge.    Patient will continue to benefit from physical therapy in order to improve function and reduce impairment.    OBJECTIVE IMPAIRMENTS: Abnormal gait, decreased activity tolerance, decreased balance, decreased knowledge of condition, decreased knowledge of use of DME, decreased mobility, difficulty walking, decreased ROM, decreased strength, hypomobility, increased fascial restrictions, impaired flexibility, postural dysfunction, and pain.   ACTIVITY LIMITATIONS: carrying, lifting, squatting, stairs, reach over head, and locomotion level  PARTICIPATION LIMITATIONS: cleaning, laundry, driving, shopping, and community activity  PERSONAL FACTORS: Age and Time since onset  of injury/illness/exacerbation are also affecting patient's functional outcome.   REHAB POTENTIAL: Excellent  CLINICAL DECISION MAKING: Stable/uncomplicated  EVALUATION COMPLEXITY: Low   GOALS: Goals reviewed with patient? Yes  SHORT TERM GOALS: Target date: 05/06/22  Patient will be independent with initial HEP and self-management strategies to improve functional outcomes Baseline: Initiated Goal status: MET    LONG TERM GOALS: Target date: 05/27/22  Patient will be independent with advanced HEP and self-management strategies to improve functional outcomes Baseline:  Goal status: MET  2.  Patient will improve FOTO score to predicted value of 51 to indicate improvement in functional outcomes Baseline: 39; 55 06/21/21 Goal status: MET  3.  Patient will demo improved combined cervical  rotation by 10 degrees total  in order to improve ability to scan environment for safety and while driving. Baseline: see above Goal status: MET  4. Patient will report a decrease in neck pain to no more than 3/10 for improved quality of life and ability to perform UE ADLs  Baseline: 6/10; 3/10 06/21/22 Goal status: MET     5. Patient will improve BERG balance score >to 51 points or greater to show low fall risk based on population that has hx of falls.    Baseline 43/56    Goal status: IN PROGRESS  PLAN:  PT FREQUENCY: 2x/week  PT DURATION: 6 weeks  PLANNED INTERVENTIONS: Therapeutic exercises, Therapeutic activity, Neuromuscular re-education, Balance training, Gait training, Patient/Family education, Self Care, Joint mobilization, Joint manipulation, Stair training, DME instructions, Dry Needling, Electrical stimulation, Spinal manipulation, Spinal mobilization, Cryotherapy, Moist heat, Taping, Traction, Ultrasound, Biofeedback, Ionotophoresis 4mg /ml Dexamethasone, Manual therapy, and Re-evaluation  PLAN FOR NEXT SESSION: discharge  10:49 AM, 06/21/22 Ivin Rosenbloom Small Sherae Santino MPT [facility] [place] [phone] Ph:[phone]

## 2022-08-31 ENCOUNTER — Encounter: Payer: Self-pay | Admitting: Emergency Medicine

## 2022-08-31 ENCOUNTER — Ambulatory Visit
Admission: EM | Admit: 2022-08-31 | Discharge: 2022-08-31 | Disposition: A | Discharge status: Home / Self Care | Payer: Medicare Other | Attending: Family Medicine | Admitting: Family Medicine

## 2022-08-31 ENCOUNTER — Other Ambulatory Visit: Payer: Self-pay

## 2022-08-31 DIAGNOSIS — J45909 Unspecified asthma, uncomplicated: Secondary | ICD-10-CM | POA: Diagnosis not present

## 2022-08-31 DIAGNOSIS — Z1152 Encounter for screening for COVID-19: Secondary | ICD-10-CM | POA: Insufficient documentation

## 2022-08-31 DIAGNOSIS — J069 Acute upper respiratory infection, unspecified: Secondary | ICD-10-CM

## 2022-08-31 DIAGNOSIS — R059 Cough, unspecified: Secondary | ICD-10-CM | POA: Diagnosis present

## 2022-08-31 LAB — POCT INFLUENZA A/B
Influenza A, POC: NEGATIVE
Influenza B, POC: NEGATIVE

## 2022-08-31 MED ORDER — BENZONATATE 100 MG PO CAPS: 100.0000 mg | ORAL_CAPSULE | Freq: Three times a day (TID) | ORAL | 0 refills | Status: DC | PRN

## 2022-08-31 MED ORDER — FLUTICASONE PROPIONATE 50 MCG/ACT NA SUSP: 1.0000 | Freq: Two times a day (BID) | NASAL | 2 refills | Status: DC

## 2022-08-31 NOTE — ED Provider Notes (Signed)
[facility]    CSN: [id number] Arrival date & time: 08/31/22  1207      History   Chief Complaint Chief Complaint  Patient presents with   Cough    HPI [name] is a 89 y.o. female.   Patient presenting today with 2-day history of hacking cough, congestion, hoarseness, scratchy throat, chills, fever.  Denies chest pain, shortness of breath, abdominal pain, nausea vomiting or diarrhea.  So far trying naproxen with minimal relief as well as her inhaler regimen for her asthma which includes daily inhaler as well as albuterol and Zyrtec for seasonal allergies.  No known sick contacts recently.  Home COVID test was negative.    Past Medical History:  Diagnosis Date   Ankle swelling    takes torsemide prn   Anxiety    Arthritis    Asthma    allergy induced   Back pain    Complication of anesthesia    COVID 2023   Depression    GERD (gastroesophageal reflux disease)    HOH (hard of hearing)    Hypercholesterolemia    Hypothyroid    Parkinson disease    PONV (postoperative nausea and vomiting)    Seasonal allergies     Patient Active Problem List   Diagnosis Date Noted   Cellulitis 12/11/2021   Cellulitis of right hand 12/10/2021   Hypocalcemia 12/10/2021   Normocytic anemia 12/10/2021   Depression 12/10/2021   Hypothyroidism 12/10/2021   Edema of left lower extremity 07/10/2021   GERD (gastroesophageal reflux disease)    History of Nissen fundoplication    IBS (irritable bowel syndrome) 02/05/2018   Diarrhea 06/16/2017   N&V (nausea and vomiting) 06/16/2017   Numbness 08/18/2015   Polyneuropathy 08/18/2015   Menopausal syndrome (hot flushes) 06/15/2015   Tremor, essential 06/15/2015   Gastroesophageal reflux disease with esophagitis 06/15/2015   Bilateral carpal tunnel syndrome 06/15/2015   Resting tremor 06/15/2015   Scoliosis of thoracic spine 11/05/2013   Spinal stenosis, unspecified region other than cervical 12/03/2012   Spondylosis  of unspecified site without mention of myelopathy 12/03/2012   Parkinson disease ([place])    Back pain    Seasonal allergies     Past Surgical History:  Procedure Laterality Date   ABDOMINAL HYSTERECTOMY     CARPAL TUNNEL RELEASE Right 10/26/2015   Procedure: CARPAL TUNNEL RELEASE;  Surgeon: Daryll Brod, MD;  Location: [place];  Service: Orthopedics;  Laterality: Right;   CARPOMETACARPEL SUSPENSION PLASTY Right 10/26/2015   Procedure: RIGHT HAND TRAPEZIECTOMY WITH THUMB METACARPEL SUSPENSION PLASTY;  Surgeon: Daryll Brod, MD;  Location: [place];  Service: Orthopedics;  Laterality: Right;   CATARACT EXTRACTION W/PHACO  12/23/2011   Procedure: CATARACT EXTRACTION PHACO AND INTRAOCULAR LENS PLACEMENT (IOC);  Surgeon: Williams Che, MD;  Location: [facility];  Service: Ophthalmology;  Laterality: Right;  CDE=5.16   CHOLECYSTECTOMY     EYE SURGERY     cataract extraction of left eye-[facility]-2002   KNEE ARTHROSCOPY     right   NISSEN FUNDOPLICATION     [facility]   TRIGGER FINGER RELEASE Right 10/26/2015   Procedure: RELEASE A-1 PULLEY RIGHT RING FINGER;  Surgeon: Daryll Brod, MD;  Location: [place];  Service: Orthopedics;  Laterality: Right;    OB History   No obstetric history on file.      Home Medications    Prior to Admission medications   Medication Sig Start Date End Date Taking? Authorizing Provider  benzonatate (TESSALON) 100 MG capsule Take 1 capsule (100 mg total) by mouth 3 (three) times daily as needed for cough. 08/31/22  Yes Volney American, PA-C  fluticasone [facility]) 50 MCG/ACT nasal spray Place 1 spray into both nostrils 2 (two) times daily. 08/31/22  Yes Volney American, PA-C  prednisoLONE acetate (PRED FORTE) 1 % ophthalmic suspension 1 drop daily.   Yes [provider]  acetaminophen (TYLENOL) 325 MG tablet Take 2 tablets (650 mg total) by mouth every 6 (six) hours as needed for moderate pain. 06/11/21   Jaynee Eagles, PA-C  albuterol (VENTOLIN HFA) 108 (90 Base) MCG/ACT inhaler Inhale 1-2 puffs into the lungs every 6 (six) hours as needed for wheezing or shortness of breath. 07/28/21   Jaynee Eagles, PA-C  azithromycin (ZITHROMAX) 250 MG tablet Take 1 tablet (250 mg total) by mouth daily. Take first 2 tablets together, then 1 every day until finished. 04/20/22   Leath-Warren, Alda Lea, NP  benzonatate (TESSALON) 100 MG capsule Take 1-2 capsules (100-200 mg total) by mouth 3 (three) times daily as needed for cough. Patient not taking: Reported on 12/11/2021 08/06/21   Jaynee Eagles, PA-C  Biotin 10 MG CAPS Take 1 capsule by mouth daily.     [provider]  carbidopa-levodopa (SINEMET IR) 25-100 MG tablet Take 1 tablet by mouth 4 (four) times daily. 04/22/22   Ward Givens, NP  cetirizine (ZYRTEC) 10 MG tablet Take 10 mg by mouth daily.    [provider]  Cholecalciferol (D3-1000 PO) Take 1 tablet by mouth daily. Patient not taking: Reported on 03/11/2022    [provider]  diphenhydrAMINE (BENADRYL) 25 MG tablet Take 25 mg by mouth every 6 (six) hours as needed.    [provider]  escitalopram (LEXAPRO) 10 MG tablet Take 10 mg by mouth daily. 11/26/21   [provider]  famotidine (PEPCID) 20 MG tablet Take one dose before evening meal each day, may take second dose if needed. No more than two per day. 01/01/22   Erenest Rasher, PA-C  ketoconazole (NIZORAL) 2 % cream Apply 1 application topically daily. Patient not taking: Reported on 12/11/2021 06/11/21   Jaynee Eagles, PA-C  levothyroxine (SYNTHROID, LEVOTHROID) 25 MCG tablet Take 25 mcg by mouth daily. 03/09/14   [provider]  montelukast (SINGULAIR) 10 MG tablet Take 10 mg by mouth at bedtime.      [provider]  Multiple Vitamins-Minerals (ZINC PO) Take by mouth daily.    [provider]  mupirocin ointment (BACTROBAN) 2 % Apply 1 application topically 3 (three) times  daily. Patient not taking: Reported on 12/11/2021 06/11/21   Jaynee Eagles, PA-C  naproxen (NAPROSYN) 500 MG tablet Take 500 mg by mouth 2 (two) times daily as needed for mild pain.    [provider]  potassium chloride (MICRO-K) 10 MEQ CR capsule Take 20 mEq by mouth daily.  08/13/13   [provider]  promethazine-dextromethorphan (PROMETHAZINE-DM) 6.25-15 MG/5ML syrup Take 5 mLs by mouth at bedtime as needed for cough. Patient not taking: Reported on 12/11/2021 08/06/21   Jaynee Eagles, PA-C  torsemide (DEMADEX) 10 MG tablet Take 10 mg by mouth daily. 1/2 to 1 tab daily    [provider]    Family History Family History  Problem Relation Age of Onset   Stroke Mother    COPD Father    Stroke Father    Colon cancer Neg Hx     Social History Social History  Tobacco Use   Smoking status: Never   Smokeless tobacco: Never  Vaping Use   Vaping Use: Never used  Substance Use Topics   Alcohol use: Yes    Alcohol/week: 1.0 standard drink of alcohol    Types: 1 Glasses of wine per week    Comment: occ   Drug use: No     Allergies   Cephalosporins, Latex, Lactose intolerance (gi), Lactulose, Cephalexin, Levofloxacin, Meperidine and related, and Omeprazole   Review of Systems Review of Systems Per HPI  Physical Exam Triage Vital Signs ED Triage Vitals  Enc Vitals Group     BP 08/31/22 1219 119/75     Pulse Rate 08/31/22 1219 (!) 111     Resp 08/31/22 1219 20     Temp 08/31/22 1219 98.1 F (36.7 C)     Temp Source 08/31/22 1219 Oral     SpO2 08/31/22 1219 94 %     Weight --      Height --      Head Circumference --      Peak Flow --      Pain Score 08/31/22 1216 5     Pain Loc --      Pain Edu? --      Excl. in [place]? --    No data found.  Updated Vital Signs BP 119/75 (BP Location: Right Arm)   Pulse (!) 111   Temp 98.1 F (36.7 C) (Oral)   Resp 20   SpO2 94%   Visual Acuity Right Eye Distance:   Left Eye Distance:   Bilateral Distance:     Right Eye Near:   Left Eye Near:    Bilateral Near:     Physical Exam Vitals and nursing note reviewed.  Constitutional:      Appearance: Normal appearance.  HENT:     Head: Atraumatic.     Right Ear: Tympanic membrane and external ear normal.     Left Ear: Tympanic membrane and external ear normal.     Nose: Rhinorrhea present.     Mouth/Throat:     Mouth: Mucous membranes are moist.     Pharynx: Posterior oropharyngeal erythema present.  Eyes:     Extraocular Movements: Extraocular movements intact.     Conjunctiva/sclera: Conjunctivae normal.  Cardiovascular:     Rate and Rhythm: Normal rate and regular rhythm.     Heart sounds: Normal heart sounds.  Pulmonary:     Effort: Pulmonary effort is normal.     Breath sounds: Normal breath sounds. No wheezing or rales.  Musculoskeletal:        General: Normal range of motion.     Cervical back: Normal range of motion and neck supple.  Skin:    General: Skin is warm and dry.  Neurological:     Mental Status: She is alert and oriented to person, place, and time.  Psychiatric:        Mood and Affect: Mood normal.        Thought Content: Thought content normal.      UC Treatments / Results  Labs (all labs ordered are listed, but only abnormal results are displayed) Labs Reviewed  SARS CORONAVIRUS 2 (TAT 6-24 HRS)  POCT INFLUENZA A/B    EKG   Radiology No results found.  Procedures Procedures (including critical care time)  Medications Ordered in UC Medications - No data to display  Initial Impression / Assessment and Plan / UC Course  I have reviewed the triage vital  signs and the nursing notes.  Pertinent labs & imaging results that were available during my care of the patient were reviewed by me and considered in my medical decision making (see chart for details).     Mildly tachycardic in triage, otherwise vital signs reassuring.  Suspect viral upper respiratory infection.  Rapid flu negative, COVID  test pending.  Good candidate for molnupiravir if positive for COVID.  Treat with Coricidin HBP, Tessalon, Flonase, other supportive remedies in addition to asthma and allergy regimen.  Return for worsening symptoms.  Final Clinical Impressions(s) / UC Diagnoses   Final diagnoses:  Viral URI with cough     Discharge Instructions      You may take Coricidin HBP, use humidifiers, sinus rinses, continue your inhaler regimen and take the medications prescribed additionally.  We should have your COVID results back tomorrow and can prescribe an antiviral medication if positive.  Follow-up for significantly worsening symptoms at any time    ED Prescriptions     Medication Sig Dispense Auth. Provider   benzonatate (TESSALON) 100 MG capsule Take 1 capsule (100 mg total) by mouth 3 (three) times daily as needed for cough. 21 capsule Volney American, PA-C   fluticasone [facility]) 50 MCG/ACT nasal spray Place 1 spray into both nostrils 2 (two) times daily. 16 g Volney American, [place]      PDMP not reviewed this encounter.   Volney American, [place] 08/31/22 1322

## 2022-08-31 NOTE — ED Triage Notes (Addendum)
Pt reports nasal congestion, cough, hoarseness,chills x2 days. Intermittent fever. Has tried naproxen with minimal change in symptoms.

## 2022-08-31 NOTE — Discharge Instructions (Signed)
You may take Coricidin HBP, use humidifiers, sinus rinses, continue your inhaler regimen and take the medications prescribed additionally.  We should have your COVID results back tomorrow and can prescribe an antiviral medication if positive.  Follow-up for significantly worsening symptoms at any time

## 2022-09-01 LAB — SARS CORONAVIRUS 2 (TAT 6-24 HRS): SARS Coronavirus 2: NEGATIVE

## 2022-09-02 ENCOUNTER — Other Ambulatory Visit: Payer: Self-pay | Admitting: Adult Health

## 2022-09-09 ENCOUNTER — Encounter: Payer: Self-pay | Admitting: *Deleted

## 2022-09-09 NOTE — Telephone Encounter (Signed)
error 

## 2022-09-20 ENCOUNTER — Ambulatory Visit
Admission: EM | Admit: 2022-09-20 | Discharge: 2022-09-20 | Disposition: A | Discharge status: Home / Self Care | Payer: Medicare Other | Attending: Nurse Practitioner | Admitting: Nurse Practitioner

## 2022-09-20 DIAGNOSIS — B029 Zoster without complications: Secondary | ICD-10-CM

## 2022-09-20 MED ORDER — PREDNISONE 20 MG PO TABS: 40.0000 mg | ORAL_TABLET | Freq: Every day | ORAL | 0 refills | Status: AC

## 2022-09-20 MED ORDER — VALACYCLOVIR HCL 1 G PO TABS: 1000.0000 mg | ORAL_TABLET | Freq: Three times a day (TID) | ORAL | 0 refills | Status: AC

## 2022-09-20 NOTE — ED Triage Notes (Signed)
Pt has a red on her back and a mole like dot x 2 days.

## 2022-09-20 NOTE — ED Provider Notes (Signed)
[facility]    CSN: [id number] Arrival date & time: 09/20/22  1718      History   Chief Complaint Chief Complaint  Patient presents with   Rash    HPI [name] is a 89 y.o. female.   The history is provided by the patient.   The patient presents for complaints of rash has been present for the past 2 days.  The patient states that rash is located on her right abdomen and right back.  She states prior to the rash starting, she did notice some burning or "prickly" sensations and low back pain.  She states that the rash is itchy.  She denies any new foods, soaps, medications, detergents, lotions, or body washes.  Patient denies any known sick contacts.  Patient denies previous history of shingles.  She states she has not received her shingles vaccine.  Past Medical History:  Diagnosis Date   Ankle swelling    takes torsemide prn   Anxiety    Arthritis    Asthma    allergy induced   Back pain    Complication of anesthesia    COVID 2023   Depression    GERD (gastroesophageal reflux disease)    HOH (hard of hearing)    Hypercholesterolemia    Hypothyroid    Parkinson disease    PONV (postoperative nausea and vomiting)    Seasonal allergies     Patient Active Problem List   Diagnosis Date Noted   Cellulitis 12/11/2021   Cellulitis of right hand 12/10/2021   Hypocalcemia 12/10/2021   Normocytic anemia 12/10/2021   Depression 12/10/2021   Hypothyroidism 12/10/2021   Edema of left lower extremity 07/10/2021   GERD (gastroesophageal reflux disease)    History of Nissen fundoplication    IBS (irritable bowel syndrome) 02/05/2018   Diarrhea 06/16/2017   N&V (nausea and vomiting) 06/16/2017   Numbness 08/18/2015   Polyneuropathy 08/18/2015   Menopausal syndrome (hot flushes) 06/15/2015   Tremor, essential 06/15/2015   Gastroesophageal reflux disease with esophagitis 06/15/2015   Bilateral carpal tunnel syndrome 06/15/2015   Resting tremor 06/15/2015    Scoliosis of thoracic spine 11/05/2013   Spinal stenosis, unspecified region other than cervical 12/03/2012   Spondylosis of unspecified site without mention of myelopathy 12/03/2012   Parkinson disease ([facility])    Back pain    Seasonal allergies     Past Surgical History:  Procedure Laterality Date   ABDOMINAL HYSTERECTOMY     CARPAL TUNNEL RELEASE Right 10/26/2015   Procedure: CARPAL TUNNEL RELEASE;  Surgeon: Cindee Salt, MD;  Location: [facility];  Service: Orthopedics;  Laterality: Right;   CARPOMETACARPEL SUSPENSION PLASTY Right 10/26/2015   Procedure: RIGHT HAND TRAPEZIECTOMY WITH THUMB METACARPEL SUSPENSION PLASTY;  Surgeon: Cindee Salt, MD;  Location: [facility];  Service: Orthopedics;  Laterality: Right;   CATARACT EXTRACTION W/PHACO  12/23/2011   Procedure: CATARACT EXTRACTION PHACO AND INTRAOCULAR LENS PLACEMENT (IOC);  Surgeon: Susa Simmonds, MD;  Location: [facility];  Service: Ophthalmology;  Laterality: Right;  CDE=5.16   CHOLECYSTECTOMY     EYE SURGERY     cataract extraction of left eye-[facility]-2002   KNEE ARTHROSCOPY     right   NISSEN FUNDOPLICATION     [facility]   TRIGGER FINGER RELEASE Right 10/26/2015   Procedure: RELEASE A-1 PULLEY RIGHT RING FINGER;  Surgeon: Cindee Salt, MD;  Location: [facility];  Service: Orthopedics;  Laterality: Right;    OB History  No obstetric history on file.      Home Medications    Prior to Admission medications   Medication Sig Start Date End Date Taking? Authorizing Provider  predniSONE (DELTASONE) 20 MG tablet Take 2 tablets (40 mg total) by mouth daily with breakfast for 5 days. 09/20/22 09/25/22 Yes Lolitha Tortora-Warren, Sadie Haber, NP  valACYclovir (VALTREX) 1000 MG tablet Take 1 tablet (1,000 mg total) by mouth 3 (three) times daily for 7 days. 09/20/22 09/27/22 Yes Adar Rase-Warren, Sadie Haber, NP  acetaminophen (TYLENOL) 325 MG tablet Take 2 tablets (650 mg total) by mouth every 6 (six) hours as needed  for moderate pain. 06/11/21   Wallis Bamberg, PA-C  albuterol (VENTOLIN HFA) 108 (90 Base) MCG/ACT inhaler Inhale 1-2 puffs into the lungs every 6 (six) hours as needed for wheezing or shortness of breath. 07/28/21   Wallis Bamberg, PA-C  azithromycin (ZITHROMAX) 250 MG tablet Take 1 tablet (250 mg total) by mouth daily. Take first 2 tablets together, then 1 every day until finished. 04/20/22   Kayson Bullis-Warren, Sadie Haber, NP  benzonatate (TESSALON) 100 MG capsule Take 1-2 capsules (100-200 mg total) by mouth 3 (three) times daily as needed for cough. Patient not taking: Reported on 12/11/2021 08/06/21   Wallis Bamberg, PA-C  benzonatate (TESSALON) 100 MG capsule Take 1 capsule (100 mg total) by mouth 3 (three) times daily as needed for cough. 08/31/22   Particia Nearing, PA-C  Biotin 10 MG CAPS Take 1 capsule by mouth daily.     [provider]  carbidopa-levodopa (SINEMET IR) 25-100 MG tablet TAKE 1 TABLET BY MOUTH 4 TIMES  DAILY 09/09/22   Butch Penny, NP  cetirizine (ZYRTEC) 10 MG tablet Take 10 mg by mouth daily.    [provider]  Cholecalciferol (D3-1000 PO) Take 1 tablet by mouth daily. Patient not taking: Reported on 03/11/2022    [provider]  diphenhydrAMINE (BENADRYL) 25 MG tablet Take 25 mg by mouth every 6 (six) hours as needed.    [provider]  escitalopram (LEXAPRO) 10 MG tablet Take 10 mg by mouth daily. 11/26/21   [provider]  famotidine (PEPCID) 20 MG tablet Take one dose before evening meal each day, may take second dose if needed. No more than two per day. 01/01/22   Letta Median, PA-C  fluticasone (FLONASE) 50 MCG/ACT nasal spray Place 1 spray into both nostrils 2 (two) times daily. 08/31/22   Particia Nearing, PA-C  ketoconazole (NIZORAL) 2 % cream Apply 1 application topically daily. Patient not taking: Reported on 12/11/2021 06/11/21   Wallis Bamberg, PA-C  levothyroxine (SYNTHROID, LEVOTHROID) 25 MCG tablet Take 25 mcg by mouth  daily. 03/09/14   [provider]  montelukast (SINGULAIR) 10 MG tablet Take 10 mg by mouth at bedtime.      [provider]  Multiple Vitamins-Minerals (ZINC PO) Take by mouth daily.    [provider]  mupirocin ointment (BACTROBAN) 2 % Apply 1 application topically 3 (three) times daily. Patient not taking: Reported on 12/11/2021 06/11/21   Wallis Bamberg, PA-C  naproxen (NAPROSYN) 500 MG tablet Take 500 mg by mouth 2 (two) times daily as needed for mild pain.    [provider]  potassium chloride (MICRO-K) 10 MEQ CR capsule Take 20 mEq by mouth daily.  08/13/13   [provider]  prednisoLONE acetate (PRED FORTE) 1 % ophthalmic suspension 1 drop daily.    [provider]  promethazine-dextromethorphan (PROMETHAZINE-DM) 6.25-15 MG/5ML syrup Take 5  mLs by mouth at bedtime as needed for cough. Patient not taking: Reported on 12/11/2021 08/06/21   Wallis Bamberg, PA-C  torsemide (DEMADEX) 10 MG tablet Take 10 mg by mouth daily. 1/2 to 1 tab daily    [provider]    Family History Family History  Problem Relation Age of Onset   Stroke Mother    COPD Father    Stroke Father    Colon cancer Neg Hx     Social History Social History   Tobacco Use   Smoking status: Never   Smokeless tobacco: Never  Vaping Use   Vaping Use: Never used  Substance Use Topics   Alcohol use: Yes    Alcohol/week: 1.0 standard drink of alcohol    Types: 1 Glasses of wine per week    Comment: occ   Drug use: No     Allergies   Cephalosporins, Latex, Lactose intolerance (gi), Lactulose, Cephalexin, Levofloxacin, Meperidine and related, and Omeprazole   Review of Systems Review of Systems Per HPI  Physical Exam Triage Vital Signs ED Triage Vitals  Enc Vitals Group     BP 09/20/22 1746 (!) 153/85     Pulse Rate 09/20/22 1746 95     Resp 09/20/22 1746 20     Temp 09/20/22 1746 98.2 F (36.8 C)     Temp Source 09/20/22 1746 Oral     SpO2  09/20/22 1746 93 %     Weight --      Height --      Head Circumference --      Peak Flow --      Pain Score 09/20/22 1750 0     Pain Loc --      Pain Edu? --      Excl. in [place]? --    No data found.  Updated Vital Signs BP (!) 153/85 (BP Location: Right Arm)   Pulse 95   Temp 98.2 F (36.8 C) (Oral)   Resp 20   SpO2 93%   Visual Acuity Right Eye Distance:   Left Eye Distance:   Bilateral Distance:    Right Eye Near:   Left Eye Near:    Bilateral Near:     Physical Exam Vitals and nursing note reviewed.  Constitutional:      General: She is not in acute distress.    Appearance: Normal appearance.  HENT:     Head: Normocephalic.  Eyes:     Extraocular Movements: Extraocular movements intact.     Pupils: Pupils are equal, round, and reactive to light.  Cardiovascular:     Rate and Rhythm: Normal rate and regular rhythm.     Pulses: Normal pulses.     Heart sounds: Normal heart sounds.  Pulmonary:     Effort: Pulmonary effort is normal. No respiratory distress.     Breath sounds: Normal breath sounds. No stridor. No wheezing, rhonchi or rales.  Abdominal:     General: Bowel sounds are normal.     Palpations: Abdomen is soft.     Tenderness: There is no abdominal tenderness.  Musculoskeletal:     Cervical back: Normal range of motion.  Lymphadenopathy:     Cervical: No cervical adenopathy.  Skin:    General: Skin is warm.     Findings: Rash present. Rash is papular and vesicular.     Comments: Erythematous blotchy patches noted on the patient's right lower quadrant and wraps around to the patient's right mid/lower back.  There are  no blisters, oozing, or fluctuance present.  Neurological:     General: No focal deficit present.     Mental Status: She is alert and oriented to person, place, and time.  Psychiatric:        Mood and Affect: Mood normal.        Behavior: Behavior normal.      UC Treatments / Results  Labs (all labs ordered are listed, but  only abnormal results are displayed) Labs Reviewed - No data to display  EKG   Radiology No results found.  Procedures Procedures (including critical care time)  Medications Ordered in UC Medications - No data to display  Initial Impression / Assessment and Plan / UC Course  I have reviewed the triage vital signs and the nursing notes.  Pertinent labs & imaging results that were available during my care of the patient were reviewed by me and considered in my medical decision making (see chart for details).  The patient is well-appearing, she is in no acute distress, she is mildly hypertensive, vital signs are otherwise stable.  Patient with rash to the right side of her abdomen and trunk that been present for the past 2 days.  Patient states prior to the rash starting, she did notice a "prickly" sensation.  Symptoms appear to be consistent with shingles.  The patient was started on valacyclovir 1000 mg 3 times daily along with prednisone 40 mg daily to help with itching.  Supportive care recommendations were provided and discussed with the patient to include use of cool compresses to help with irritation and itching, and avoidance of rubbing and scratching the rash while symptoms persist.  Patient was advised if the rash begins to blister and use, she should keep the rash covered.  Patient was advised to follow-up with her primary care physician to advise of her recent diagnosis.  Patient is in agreement with this plan of care and verbalizes understanding.  All questions were answered.  Patient stable for discharge.   Final Clinical Impressions(s) / UC Diagnoses   Final diagnoses:  Herpes zoster without complication     Discharge Instructions      Take medication as prescribed. Increase fluids and allow for plenty of rest. May take over-the-counter Tylenol as needed for pain, fever, general discomfort. May apply cool compresses to the rash to help with irritation and  itching. Avoid rubbing, scratching, or manipulating the rash while symptoms persist. If the rash begins to develop blisters and begins oozing, keep the areas covered. As discussed, please follow-up with your primary care physician to let them know that you have been diagnosed with shingles.  If you develop pain in the area in which the rash was located, please follow-up with your primary care physician for further evaluation. Follow-up as needed.     ED Prescriptions     Medication Sig Dispense Auth. Provider   valACYclovir (VALTREX) 1000 MG tablet Take 1 tablet (1,000 mg total) by mouth 3 (three) times daily for 7 days. 21 tablet Jovani Flury-Warren, Sadie Haber, NP   predniSONE (DELTASONE) 20 MG tablet Take 2 tablets (40 mg total) by mouth daily with breakfast for 5 days. 10 tablet Angelina Neece-Warren, Sadie Haber, NP      PDMP not reviewed this encounter.   Abran Cantor, NP 09/20/22 2005

## 2022-09-20 NOTE — Discharge Instructions (Signed)
Take medication as prescribed. Increase fluids and allow for plenty of rest. May take over-the-counter Tylenol as needed for pain, fever, general discomfort. May apply cool compresses to the rash to help with irritation and itching. Avoid rubbing, scratching, or manipulating the rash while symptoms persist. If the rash begins to develop blisters and begins oozing, keep the areas covered. As discussed, please follow-up with your primary care physician to let them know that you have been diagnosed with shingles.  If you develop pain in the area in which the rash was located, please follow-up with your primary care physician for further evaluation. Follow-up as needed.

## 2022-09-23 ENCOUNTER — Ambulatory Visit: Payer: Medicare Other | Admitting: Neurology

## 2022-11-04 ENCOUNTER — Other Ambulatory Visit: Payer: Self-pay | Admitting: Adult Health

## 2022-11-06 ENCOUNTER — Ambulatory Visit: Payer: Medicare Other | Admitting: Family Medicine

## 2022-11-12 ENCOUNTER — Ambulatory Visit: Payer: Medicare Other | Admitting: Family Medicine

## 2022-11-26 ENCOUNTER — Other Ambulatory Visit: Payer: Self-pay | Admitting: Family Medicine

## 2022-11-26 NOTE — Telephone Encounter (Signed)
Unable to refill per protocol, last refill by another provider.   Requested Prescriptions  Pending Prescriptions Disp Refills   fluticasone (FLONASE) 50 MCG/ACT nasal spray [Pharmacy Med Name: FLUTICASONE PROP 50 MCG SPRAY] 48 mL     Sig: PLACE 1 SPRAY INTO BOTH NOSTRILS 2 (TWO) TIMES DAILY     There is no refill protocol information for this order

## 2022-12-03 ENCOUNTER — Encounter: Payer: Self-pay | Admitting: Neurology

## 2022-12-03 ENCOUNTER — Telehealth: Payer: Self-pay | Admitting: Neurology

## 2022-12-03 ENCOUNTER — Ambulatory Visit: Payer: Medicare Other | Admitting: Neurology

## 2022-12-03 VITALS — BP 138/81 | HR 98 | Ht 62.0 in | Wt 160.0 lb

## 2022-12-03 DIAGNOSIS — M542 Cervicalgia: Secondary | ICD-10-CM

## 2022-12-03 DIAGNOSIS — G20A2 Parkinson's disease without dyskinesia, with fluctuations: Secondary | ICD-10-CM

## 2022-12-03 DIAGNOSIS — B0229 Other postherpetic nervous system involvement: Secondary | ICD-10-CM

## 2022-12-03 DIAGNOSIS — R293 Abnormal posture: Secondary | ICD-10-CM | POA: Diagnosis not present

## 2022-12-03 MED ORDER — ALPRAZOLAM 0.5 MG PO TABS: 0.2500 mg | ORAL_TABLET | Freq: Every evening | ORAL | 0 refills | Status: DC | PRN

## 2022-12-03 MED ORDER — LIDOCAINE-PRILOCAINE 2.5-2.5 % EX CREA: 1.0000 | TOPICAL_CREAM | CUTANEOUS | 5 refills | Status: DC | PRN

## 2022-12-03 MED ORDER — CARBIDOPA-LEVODOPA 25-100 MG PO TABS: 1.0000 | ORAL_TABLET | Freq: Four times a day (QID) | ORAL | 0 refills | Status: DC

## 2022-12-03 MED ORDER — MEDI-PATCH-LIDOCAINE 0.5-0.035-5-20 % EX PTCH: MEDICATED_PATCH | CUTANEOUS | 1 refills | Status: AC

## 2022-12-03 NOTE — Progress Notes (Signed)
Provider:  Melvyn Novas, MD  Primary Care Physician:  John Giovanni, MD [address] [place] [address]     Referring Provider: John Giovanni, Md [address] [place],  [place] [address]          Chief Complaint according to patient   Patient presents with:     New Patient (Initial Visit)           HISTORY OF PRESENT ILLNESS:  [name] is a 89 y.o. female patient who is here for revisit 12/03/2022 for  a non routine Revsit. Marland Kitchen  12-03-2022:  Chief concern according to patient :  Pt here with daughter, rm 2. She is here today for parkinsons follow up. Had shingles since April 24, and is still in pain- affecting her sleep- started on the upper abdomen , involving the naval,  halfway around the right body.   She continues the carbidopa levo 25/100 mg 4 times a day. Overall has had a lot of other health issues that are happening. She is concerned  about gabapentin clouding her mind and memory. She has been miserable. We discussed EMLA cream and Lidocaine patches to help topically.  She is notably more stooped.         Today 03/11/22 : She had a fall going up a step right after Christmas. She didn't have any broken bones but it took her awhile to get over it. She fell since then as well. She believes she is unsteady and balance is worse but doesn't feel any other symptoms have worsened. She lives alone and does everything for herselfMs. Torres is an 89 year old year female with a parkinson disease. She returns today for follow-up.  She has not been seen since 2021. Has had several falls, fortunately has not had significant injuries. Uses a cane or walking stick when ambulating.  Does get chocked with food and liquids. Very sporadic.  Does not occur every time she eats. Tremor is mild in the upper extremities. Doesn't sleep well. Has trouble falling asleep but once a asleep will sleep all night.  Continues on Sinemet 4 times a day   09/06/19: [name]  is an 89 year old female with a history of Parkinson's disease.  She returns today for follow-up.  She states that she was in physical therapy up until December.  She states that she was doing well. She states that she stopped therapy around Christmas due to increase in COVID-19 cases in [place].  She would like to restart therapy now.  She reports that she did have a recent fall on her back deck.  Reports that her tremor is stable.  Reports on occasion she may get choked when trying to swallow food particularly cornbread.  But this is not consistent.  She returns today for an evaluation.     HISTORY [name] is an 89 year old female with Parkinson's disease.  She returns today for follow-up.  She states that she noticed over the summer that the heat affects her Parkinson's.  Therefore she tries to stay indoors.  She states that her biggest issue is staying asleep.  She states that some nights she does not go to bed till after midnight.  And even then she may wake up several times.  She states that she tried taking Xanax before bed but it makes her sleep a lot.  She states in the past she is also tried melatonin but  took it right before bedtime.  She continues on Sinemet but reports that she typically takes it 3 times a day instead of 4.  Reports that there are times that her balance has been off.  She has reported some falls.  She states that she typically falls forward.  Fortunately she is not suffered any significant injuries.  She states that she has been trying to use her cane more.  Denies any trouble eating.  Reports that she does eat slower than before.  She returns today for evaluation.       Review of Systems: Out of a complete 14 system review, the patient complains of only the following symptoms, and all other reviewed systems are negative.:  Fatigue, sleepiness.  Herpetic Neuralgia T 11 right.       Social History   Socioeconomic History   Marital status: Widowed     Spouse name: Not on file   Number of children: 2   Years of education: Not on file   Highest education level: Not on file  Occupational History    Comment: retired  Tobacco Use   Smoking status: Never   Smokeless tobacco: Never  Vaping Use   Vaping Use: Never used  Substance and Sexual Activity   Alcohol use: Yes    Alcohol/week: 1.0 standard drink of alcohol    Types: 1 Glasses of wine per week    Comment: occ   Drug use: No   Sexual activity: Not Currently    Birth control/protection: Surgical  Other Topics Concern   Not on file  Social History Narrative   ** Merged History Encounter ** Patient lives at home alone and she is widowed.   Retired.    Education one year business course.   Right handed.   Caffeine two cup of coffee daily and 4 cups of sweet tea   Social Determinants of Health   Financial Resource Strain: Not on file  Food Insecurity: Not on file  Transportation Needs: Not on file  Physical Activity: Not on file  Stress: Not on file  Social Connections: Not on file    Family History  Problem Relation Age of Onset   Stroke Mother    COPD Father    Stroke Father    Colon cancer Neg Hx     Past Medical History:  Diagnosis Date   Ankle swelling    takes torsemide prn   Anxiety    Arthritis    Asthma    allergy induced   Back pain    Complication of anesthesia    COVID 2023   Depression    GERD (gastroesophageal reflux disease)    HOH (hard of hearing)    Hypercholesterolemia    Hypothyroid    Parkinson disease    PONV (postoperative nausea and vomiting)    Seasonal allergies     Past Surgical History:  Procedure Laterality Date   ABDOMINAL HYSTERECTOMY     CARPAL TUNNEL RELEASE Right 10/26/2015   Procedure: CARPAL TUNNEL RELEASE;  Surgeon: Cindee Salt, MD;  Location: [facility];  Service: Orthopedics;  Laterality: Right;   CARPOMETACARPEL SUSPENSION PLASTY Right 10/26/2015   Procedure: RIGHT HAND TRAPEZIECTOMY WITH THUMB  METACARPEL SUSPENSION PLASTY;  Surgeon: Cindee Salt, MD;  Location: [facility];  Service: Orthopedics;  Laterality: Right;   CATARACT EXTRACTION W/PHACO  12/23/2011   Procedure: CATARACT EXTRACTION PHACO AND INTRAOCULAR LENS PLACEMENT (IOC);  Surgeon: Susa Simmonds, MD;  Location: [facility];  Service: Ophthalmology;  Laterality: Right;  CDE=5.16   CHOLECYSTECTOMY     EYE SURGERY     cataract extraction of left eye-[facility]-2002   KNEE ARTHROSCOPY     right   NISSEN FUNDOPLICATION     [facility]   TRIGGER FINGER RELEASE Right 10/26/2015   Procedure: RELEASE A-1 PULLEY RIGHT RING FINGER;  Surgeon: Cindee Salt, MD;  Location: [facility];  Service: Orthopedics;  Laterality: Right;     Current Outpatient Medications on File Prior to Visit  Medication Sig Dispense Refill   acetaminophen (TYLENOL) 325 MG tablet Take 2 tablets (650 mg total) by mouth every 6 (six) hours as needed for moderate pain. 30 tablet 0   albuterol (VENTOLIN HFA) 108 (90 Base) MCG/ACT inhaler Inhale 1-2 puffs into the lungs every 6 (six) hours as needed for wheezing or shortness of breath. 18 g 0   ALPRAZolam (XANAX) 0.5 MG tablet Take 0.25 mg by mouth at bedtime as needed for sleep.     carbidopa-levodopa (SINEMET IR) 25-100 MG tablet TAKE 1 TABLET BY MOUTH 4 TIMES  DAILY 360 tablet 0   cetirizine (ZYRTEC) 10 MG tablet Take 10 mg by mouth daily.     diphenhydrAMINE (BENADRYL) 25 MG tablet Take 25 mg by mouth every 6 (six) hours as needed.     escitalopram (LEXAPRO) 10 MG tablet Take 10 mg by mouth daily.     famotidine (PEPCID) 20 MG tablet Take one dose before evening meal each day, may take second dose if needed. No more than two per day. 60 tablet 5   fluticasone (FLONASE) 50 MCG/ACT nasal spray Place 1 spray into both nostrils 2 (two) times daily. 16 g 2   gabapentin (NEURONTIN) 100 MG capsule Take 300 mg by mouth 3 (three) times daily.     ketoconazole (NIZORAL) 2 % cream Apply 1 application topically  daily. 30 g 0   levothyroxine (SYNTHROID, LEVOTHROID) 25 MCG tablet Take 25 mcg by mouth daily.     Multiple Vitamins-Minerals (ZINC PO) Take by mouth daily.     mupirocin ointment (BACTROBAN) 2 % Apply 1 application topically 3 (three) times daily. 30 g 0   naproxen (NAPROSYN) 500 MG tablet Take 500 mg by mouth 2 (two) times daily as needed for mild pain.     potassium chloride (MICRO-K) 10 MEQ CR capsule Take 20 mEq by mouth daily.      prednisoLONE acetate (PRED FORTE) 1 % ophthalmic suspension 1 drop daily.     torsemide (DEMADEX) 10 MG tablet Take 10 mg by mouth daily. 1/2 to 1 tab daily     No current facility-administered medications on file prior to visit.    Allergies  Allergen Reactions   Cephalosporins Rash   Latex Rash    Breaks out then gets infected   Lactose Intolerance (Gi) Nausea And Vomiting   Lactulose Nausea And Vomiting   Cephalexin Rash   Levofloxacin Anxiety and Rash   Meperidine And Related Nausea And Vomiting    Needed Phenergan to combat nausea.   Omeprazole Rash     DIAGNOSTIC DATA (LABS, IMAGING, TESTING) - I reviewed patient records, labs, notes, testing and imaging myself where available.  Lab Results  Component Value Date   WBC 8.4 12/11/2021   HGB 10.7 (L) 12/11/2021   HCT 32.9 (L) 12/11/2021   MCV 95.4 12/11/2021   PLT 227 12/11/2021      Component Value Date/Time   NA 142 12/11/2021 0443   K 3.9  12/11/2021 0443   CL 108 12/11/2021 0443   CO2 27 12/11/2021 0443   GLUCOSE 103 (H) 12/11/2021 0443   BUN 13 12/11/2021 0443   CREATININE 0.90 12/11/2021 0443   CALCIUM 8.4 (L) 12/11/2021 0443   PROT 6.6 12/10/2021 1359   ALBUMIN 3.5 12/10/2021 1359   AST 27 12/10/2021 1359   ALT 9 12/10/2021 1359   ALKPHOS 84 12/10/2021 1359   BILITOT 0.3 12/10/2021 1359   GFRNONAA >60 12/11/2021 0443   GFRAA >60 10/24/2015 1038   No results found for: "CHOL", "HDL", "LDLCALC", "LDLDIRECT", "TRIG", "CHOLHDL" No results found for: "HGBA1C" No results  found for: "VITAMINB12" No results found for: "TSH"  PHYSICAL EXAM:  Today's Vitals   12/03/22 1328  BP: 138/81  Pulse: 98  Weight: 160 lb (72.6 kg)  Height: 5\' 2"  (1.575 m)   Body mass index is 29.26 kg/m.   Wt Readings from Last 3 Encounters:  12/03/22 160 lb (72.6 kg)  03/11/22 164 lb (74.4 kg)  12/10/21 164 lb (74.4 kg)     Ht Readings from Last 3 Encounters:  12/03/22 5\' 2"  (1.575 m)  03/11/22 5' (1.524 m)  12/10/21 5\' 2"  (1.575 m)      General: The patient is awake, alert and appears not in acute distress. The patient is well groomed. Head: Normocephalic, atraumatic. Neck is supple.  Dental status:  Cardiovascular:  Regular rate and cardiac rhythm by pulse,  without distended neck veins. Respiratory: Lungs are clear to auscultation.  Skin:  With evidence of mild ankle edema, or rash. Hot to touch.  Trunk: The patient's posture is stooped    NEUROLOGIC EXAM: The patient is awake and alert, oriented to place and time.   Memory subjective described as intact.  Attention span & concentration ability appears normal.  Speech is fluent,  without dysarthria, with dysphonia , no aphasia.  Mood and affect are appropriate.   Cranial nerves: no loss of smell or taste reported  Pupils are equal and briskly reactive to light. Funduscopic exam deferred.  Extraocular movements in vertical and horizontal planes were intact and without nystagmus. No Diplopia. Visual fields by finger perimetry are intact. Hearing was intact to soft voice and finger rubbing.    Facial sensation intact to fine touch.  Facial motor strength is symmetric and tongue and uvula move midline.  Neck ROM : head drop- stooped - rotation, tilt and flexion extension were severely limited.  shoulder shrug was symmetrical.    Motor exam:  Symmetric bulk, tone and ROM.   Normal tone with cog wheeling, but with symmetric grip strength .Mild resting tremor in the upper extremities. Right greater than left     Sensory:  Fine touch, pinprick and vibration were intact  Proprioception tested in the upper extremities was normal.   Coordination: Rapid alternating movements in the fingers/hands were of normal speed.  The Finger-to-nose maneuver was impaired : evidence of ataxia, dysmetria  but low amplitude  tremor.     Gait and station: Patient could barely rise unassisted from a seated position, but walked with a cane ,stooped.  assistive device. Arm-swing on the right preserved.  Stance is of normal width/ base and the patient turned with 4. 5 steps.  Toe and heel walk were deferred.  Deep tendon reflexes: in the  upper and lower extremities are symmetric and intact.  Babinski response was deferred.    ASSESSMENT AND PLAN 89 y.o. established PD patient , here with progression of stooped gait, shuffling,  drop head.  Suffers form pain due to shingles, Herpes Zoster with neuralgia.  in Th 10 right , in pain.    1)  Help with head movements and stooped posture PT / neuro rehab   2) EMLA cream and lidocaine patches for zoster pain.   3)  blood work for inflammation or infection, she is hot to touch.    I plan to follow up either personally or through our NP within 4 months.   I would like to thank John Giovanni, MD and John Giovanni, Md [address] [place],  [place] [address] for allowing me to meet with and to take care of this pleasant patient.    After spending a total time of  35  minutes face to face and additional time for physical and neurologic examination, review of laboratory studies,  personal review of imaging studies, reports and results of other testing and review of referral information / records as far as provided in visit,   Electronically signed by: Melvyn Novas, MD 12/03/2022 1:42 PM  [facility] Board certified by The [employer] of Sleep Medicine and Diplomate of the [employer] of [employer]. Board certified  In Neurology through the ABPN, Fellow of the [employer] of Neu[employer].

## 2022-12-03 NOTE — Telephone Encounter (Signed)
PT referral says in home, this will need to be changed to a home health referral for PT.

## 2022-12-03 NOTE — Addendum Note (Signed)
Addended by: Melvyn Novas on: 12/03/2022 02:18 PM   Modules accepted: Orders

## 2022-12-03 NOTE — Telephone Encounter (Signed)
Home Health order place.

## 2022-12-03 NOTE — Patient Instructions (Signed)
  ASSESSMENT AND PLAN 89 y.o. established PD patient , here with progression of stooped gait, shuffling, drop head.  Suffers form pain due to shingles, Herpes Zoster with neuralgia.  in Th 10 right , in pain.    1)  Help with head movements and stooped posture PT / neuro rehab   2) EMLA cream and lidocaine patches for zoster pain.   3)  blood work for inflammation or infection, she is hot to touch.    I plan to follow up either personally or through our NP within 4 months.

## 2022-12-03 NOTE — Addendum Note (Signed)
Addended by: Berna Spare A on: 12/03/2022 02:56 PM   Modules accepted: Orders

## 2022-12-04 NOTE — Telephone Encounter (Signed)
[facility] is taking this patient starting tomorrow 6/27.

## 2022-12-13 ENCOUNTER — Other Ambulatory Visit: Payer: Self-pay | Admitting: Family Medicine

## 2022-12-18 ENCOUNTER — Ambulatory Visit: Payer: Medicare Other | Admitting: Family Medicine

## 2022-12-18 ENCOUNTER — Encounter: Payer: Self-pay | Admitting: Family Medicine

## 2022-12-18 VITALS — BP 120/71 | HR 72 | Ht 62.0 in | Wt 162.0 lb

## 2022-12-18 DIAGNOSIS — B0229 Other postherpetic nervous system involvement: Secondary | ICD-10-CM

## 2022-12-18 DIAGNOSIS — R7301 Impaired fasting glucose: Secondary | ICD-10-CM | POA: Diagnosis not present

## 2022-12-18 DIAGNOSIS — E038 Other specified hypothyroidism: Secondary | ICD-10-CM

## 2022-12-18 DIAGNOSIS — N6459 Other signs and symptoms in breast: Secondary | ICD-10-CM

## 2022-12-18 DIAGNOSIS — Z13820 Encounter for screening for osteoporosis: Secondary | ICD-10-CM

## 2022-12-18 DIAGNOSIS — E7849 Other hyperlipidemia: Secondary | ICD-10-CM

## 2022-12-18 DIAGNOSIS — Z114 Encounter for screening for human immunodeficiency virus [HIV]: Secondary | ICD-10-CM

## 2022-12-18 DIAGNOSIS — E559 Vitamin D deficiency, unspecified: Secondary | ICD-10-CM

## 2022-12-18 DIAGNOSIS — Z1159 Encounter for screening for other viral diseases: Secondary | ICD-10-CM

## 2022-12-18 MED ORDER — GABAPENTIN 100 MG PO CAPS: 300.0000 mg | ORAL_CAPSULE | Freq: Three times a day (TID) | ORAL | 1 refills | Status: DC

## 2022-12-18 NOTE — Patient Instructions (Addendum)
I appreciate the opportunity to provide care to you today!    Follow up:  3 months  Labs: please stop by the lab during the week to get your blood drawn (CBC, CMP, TSH, Lipid profile, HgA1c, Vit D)  Screening: HIV and Hep C  Per CDC there is no specific length of time that you need to wait after having shingles before you can receive Shingrix, but generally you should make sure the shingles rash has gone away before getting vaccinated.  Shingles vaccination is the only way to protect against shingles and postherpetic neuralgia (PHN), the most common complication from shingles   Please stop by your local pharmacy and get your Shingles vaccine   Attached with your AVS, you will find valuable resources for self-education. I highly recommend dedicating some time to thoroughly examine them.   Please continue to a heart-healthy diet and increase your physical activities. Try to exercise for at least five days a week.    It was a pleasure to see you and I look forward to continuing to work together on your health and well-being. Please do not hesitate to call the office if you need care or have questions about your care.  In case of emergency, please visit the Emergency Department for urgent care, or contact our clinic at [phone] to schedule an appointment. We're here to help you!   Have a wonderful day and week. With Gratitude, Gilmore Laroche MSN, FNP-BC

## 2022-12-18 NOTE — Progress Notes (Addendum)
New Patient Office Visit  Subjective:  Patient ID: [name], female    DOB: 12-Mar-1934  Age: 89 y.o. MRN: [id number]  CC:  Chief Complaint  Patient presents with   New Patient (Initial Visit)    Establishing care. Reports shingles flare up back in April still having lingering sx. Has questions as to when to get shingles vaccines.     HPI [name] is a 89 y.o. female with past medical history of PHN, Parkinson disease, hypothyroidism, GERD presents for establishing care. For the details of today's visit, please refer to the assessment and plan.     Past Medical History:  Diagnosis Date   Ankle swelling    takes torsemide prn   Anxiety    Arthritis    Asthma    allergy induced   Back pain    Complication of anesthesia    COVID 2023   Depression    GERD (gastroesophageal reflux disease)    HOH (hard of hearing)    Hypercholesterolemia    Hypothyroid    Parkinson disease    PONV (postoperative nausea and vomiting)    Seasonal allergies     Past Surgical History:  Procedure Laterality Date   ABDOMINAL HYSTERECTOMY     CARPAL TUNNEL RELEASE Right 10/26/2015   Procedure: CARPAL TUNNEL RELEASE;  Surgeon: Cindee Salt, MD;  Location: [facility];  Service: Orthopedics;  Laterality: Right;   CARPOMETACARPEL SUSPENSION PLASTY Right 10/26/2015   Procedure: RIGHT HAND TRAPEZIECTOMY WITH THUMB METACARPEL SUSPENSION PLASTY;  Surgeon: Cindee Salt, MD;  Location: [facility];  Service: Orthopedics;  Laterality: Right;   CATARACT EXTRACTION W/PHACO  12/23/2011   Procedure: CATARACT EXTRACTION PHACO AND INTRAOCULAR LENS PLACEMENT (IOC);  Surgeon: Susa Simmonds, MD;  Location: [facility];  Service: Ophthalmology;  Laterality: Right;  CDE=5.16   CHOLECYSTECTOMY     EYE SURGERY     cataract extraction of left eye-[facility]-2002   KNEE ARTHROSCOPY     right   NISSEN FUNDOPLICATION     [facility]   TRIGGER FINGER RELEASE Right 10/26/2015   Procedure: RELEASE A-1  PULLEY RIGHT RING FINGER;  Surgeon: Cindee Salt, MD;  Location: [facility];  Service: Orthopedics;  Laterality: Right;    Family History  Problem Relation Age of Onset   Stroke Mother    COPD Father    Stroke Father    Colon cancer Neg Hx     Social History   Socioeconomic History   Marital status: Widowed    Spouse name: Not on file   Number of children: 2   Years of education: Not on file   Highest education level: Not on file  Occupational History    Comment: retired  Tobacco Use   Smoking status: Never   Smokeless tobacco: Never  Vaping Use   Vaping status: Never Used  Substance and Sexual Activity   Alcohol use: Yes    Alcohol/week: 1.0 standard drink of alcohol    Types: 1 Glasses of wine per week    Comment: occ   Drug use: No   Sexual activity: Not Currently    Birth control/protection: Surgical  Other Topics Concern   Not on file  Social History Narrative   ** Merged History Encounter ** Patient lives at home alone and she is widowed.   Retired.    Education one year business course.   Right handed.   Caffeine two cup of coffee daily and 4 cups  of sweet tea   Social Determinants of Health   Financial Resource Strain: Not on file  Food Insecurity: Not on file  Transportation Needs: Not on file  Physical Activity: Not on file  Stress: Not on file  Social Connections: Not on file  Intimate Partner Violence: Not on file    ROS Review of Systems  Objective:   Today's Vitals: BP 120/71   Pulse 72   Ht 5\' 2"  (1.575 m)   Wt 162 lb (73.5 kg)   SpO2 93%   BMI 29.63 kg/m   Physical Exam HENT:     Head: Normocephalic.     Mouth/Throat:     Mouth: Mucous membranes are moist.  Cardiovascular:     Rate and Rhythm: Normal rate.     Heart sounds: Normal heart sounds.  Pulmonary:     Effort: Pulmonary effort is normal.     Breath sounds: Normal breath sounds.  Neurological:     Mental Status: She is alert.      Assessment & Plan:    Inversion of both nipples Assessment & Plan: No visible lumps, dimpled or depressed skin, bloody discharged, color or textured changes No nipple or breast pain No lumps palpated bilaterally Onset of symptoms 1 year ago No fever, chills or unintentional weight loss Will get a diagnostic mammogram to r/o carcinoma    Orders: -     MM 3D DIAGNOSTIC MAMMOGRAM BILATERAL BREAST  PHN (postherpetic neuralgia) Assessment & Plan: Encouraged to continue taking gabapentin 300 mg TID Refill sent to the pharmacy Informed that per CDC guidelines there is no specific length of time that you need to wait after having shingles before you can receive Shingrix, but generally you should make sure the shingles rash has gone away before getting vaccinated   Orders: -     Gabapentin; Take 3 capsules (300 mg total) by mouth 3 (three) times daily.  Dispense: 240 capsule; Refill: 1  Osteoporosis screening -     DG Bone Density  IFG (impaired fasting glucose) -     Hemoglobin A1c  Vitamin D deficiency -     VITAMIN D 25 Hydroxy (Vit-D Deficiency, Fractures)  Need for hepatitis C screening test -     Hepatitis C antibody  Encounter for screening for HIV -     HIV Antibody (routine testing w rflx)  Other specified hypothyroidism -     TSH + free T4  Other hyperlipidemia -     Lipid panel -     CMP14+EGFR -     CBC with Differential/Platelet  Note: This chart has been completed using Engineer, civil (consulting) software, and while attempts have been made to ensure accuracy, certain words and phrases may not be transcribed as intended.       Follow-up: Return in about 3 months (around 03/20/2023).   Joanne Laroche, FNP

## 2022-12-18 NOTE — Assessment & Plan Note (Signed)
Encouraged to continue taking gabapentin 300 mg TID Refill sent to the pharmacy Informed that per CDC guidelines there is no specific length of time that you need to wait after having shingles before you can receive Shingrix, but generally you should make sure the shingles rash has gone away before getting vaccinated

## 2022-12-18 NOTE — Assessment & Plan Note (Signed)
No visible lumps, dimpled or depressed skin, bloody discharged, color or textured changes No nipple or breast pain No lumps palpated bilaterally Onset of symptoms 1 year ago No fever, chills or unintentional weight loss Will get a diagnostic mammogram to r/o carcinoma

## 2022-12-24 ENCOUNTER — Other Ambulatory Visit: Payer: Self-pay | Admitting: Family Medicine

## 2022-12-24 DIAGNOSIS — E559 Vitamin D deficiency, unspecified: Secondary | ICD-10-CM

## 2022-12-24 LAB — CMP14+EGFR
ALT: 9 IU/L (ref 0–32)
AST: 14 IU/L (ref 0–40)
Albumin: 4.2 g/dL (ref 3.7–4.7)
Alkaline Phosphatase: 82 IU/L (ref 44–121)
BUN/Creatinine Ratio: 13 (ref 12–28)
BUN: 11 mg/dL (ref 8–27)
Bilirubin Total: 0.5 mg/dL (ref 0.0–1.2)
CO2: 21 mmol/L (ref 20–29)
Calcium: 9.1 mg/dL (ref 8.7–10.3)
Chloride: 108 mmol/L — ABNORMAL HIGH (ref 96–106)
Creatinine, Ser: 0.82 mg/dL (ref 0.57–1.00)
Globulin, Total: 2 g/dL (ref 1.5–4.5)
Glucose: 81 mg/dL (ref 70–99)
Potassium: 4.8 mmol/L (ref 3.5–5.2)
Sodium: 143 mmol/L (ref 134–144)
Total Protein: 6.2 g/dL (ref 6.0–8.5)
eGFR: 69 mL/min/{1.73_m2} (ref >59)

## 2022-12-24 LAB — HIV ANTIBODY (ROUTINE TESTING W REFLEX): HIV Screen 4th Generation wRfx: NONREACTIVE

## 2022-12-24 LAB — CBC WITH DIFFERENTIAL/PLATELET
Basophils Absolute: 0 10*3/uL (ref 0.0–0.2)
Basos: 0 %
EOS (ABSOLUTE): 0 10*3/uL (ref 0.0–0.4)
Eos: 0 %
Hematocrit: 38 % (ref 34.0–46.6)
Hemoglobin: 12.5 g/dL (ref 11.1–15.9)
Immature Grans (Abs): 0.1 10*3/uL (ref 0.0–0.1)
Immature Granulocytes: 1 %
Lymphocytes Absolute: 1.7 10*3/uL (ref 0.7–3.1)
Lymphs: 34 %
MCH: 30.6 pg (ref 26.6–33.0)
MCHC: 32.9 g/dL (ref 31.5–35.7)
MCV: 93 fL (ref 79–97)
Monocytes Absolute: 1.2 10*3/uL — ABNORMAL HIGH (ref 0.1–0.9)
Monocytes: 24 %
Neutrophils Absolute: 2.1 10*3/uL (ref 1.4–7.0)
Neutrophils: 41 %
Platelets: 247 10*3/uL (ref 150–450)
RBC: 4.09 x10E6/uL (ref 3.77–5.28)
RDW: 13.2 % (ref 11.7–15.4)
WBC: 5 10*3/uL (ref 3.4–10.8)

## 2022-12-24 LAB — TSH+FREE T4
Free T4: 0.96 ng/dL (ref 0.82–1.77)
TSH: 3.63 u[IU]/mL (ref 0.450–4.500)

## 2022-12-24 LAB — HEPATITIS C ANTIBODY: Hep C Virus Ab: NONREACTIVE

## 2022-12-24 LAB — HEMOGLOBIN A1C
Est. average glucose Bld gHb Est-mCnc: 131 mg/dL
Hgb A1c MFr Bld: 6.2 % — ABNORMAL HIGH (ref 4.8–5.6)

## 2022-12-24 LAB — LIPID PANEL
Chol/HDL Ratio: 3.3 ratio (ref 0.0–4.4)
Cholesterol, Total: 180 mg/dL (ref 100–199)
HDL: 54 mg/dL (ref >39)
LDL Chol Calc (NIH): 110 mg/dL — ABNORMAL HIGH (ref 0–99)
Triglycerides: 85 mg/dL (ref 0–149)
VLDL Cholesterol Cal: 16 mg/dL (ref 5–40)

## 2022-12-24 LAB — VITAMIN D 25 HYDROXY (VIT D DEFICIENCY, FRACTURES): Vit D, 25-Hydroxy: 29.6 ng/mL — ABNORMAL LOW (ref 30.0–100.0)

## 2022-12-24 MED ORDER — VITAMIN D (ERGOCALCIFEROL) 1.25 MG (50000 UNIT) PO CAPS: 50000.0000 [IU] | ORAL_CAPSULE | ORAL | 1 refills | Status: DC

## 2022-12-24 NOTE — Progress Notes (Signed)
A weekly vitamin D supplement prescription has been sent to your pharmacy because your vitamin D is low. Your cholesterol levels are elevated. I want your LDL to be less than 100. I recommend avoiding simple carbohydrates including cakes, sweet desserts, ice cream, soda (diet or regular), sweet tea, candies, chips, cookies, store-bought juices, alcohol in excess of 1-2 drinks a day, lemonade, artificial sweeteners, donuts, coffee creamers, and sugar-free products.  I recommend avoiding greasy, fatty foods with increased physical activity. Your hga1c is 6.2, this indicate that you are prediabetic, I recommend decreasing your intake of foods high in sugar. Your thyroid, kidneys, and liver function are stable.

## 2023-01-24 ENCOUNTER — Other Ambulatory Visit: Payer: Self-pay | Admitting: Family Medicine

## 2023-01-24 ENCOUNTER — Ambulatory Visit
Admission: EM | Admit: 2023-01-24 | Discharge: 2023-01-24 | Disposition: A | Discharge status: Home / Self Care | Payer: Medicare Other | Attending: Nurse Practitioner | Admitting: Nurse Practitioner

## 2023-01-24 DIAGNOSIS — J309 Allergic rhinitis, unspecified: Secondary | ICD-10-CM | POA: Diagnosis not present

## 2023-01-24 DIAGNOSIS — N3000 Acute cystitis without hematuria: Secondary | ICD-10-CM | POA: Diagnosis not present

## 2023-01-24 DIAGNOSIS — Z1152 Encounter for screening for COVID-19: Secondary | ICD-10-CM | POA: Diagnosis not present

## 2023-01-24 LAB — POCT URINALYSIS DIP (MANUAL ENTRY)
Bilirubin, UA: NEGATIVE
Glucose, UA: 100 mg/dL — AB
Ketones, POC UA: NEGATIVE mg/dL
Nitrite, UA: POSITIVE — AB
Protein Ur, POC: NEGATIVE mg/dL
Spec Grav, UA: <=1.005 — AB (ref 1.010–1.025)
Urobilinogen, UA: 0.2 E.U./dL
pH, UA: 5.5 (ref 5.0–8.0)

## 2023-01-24 MED ORDER — BENZONATATE 100 MG PO CAPS: 100.0000 mg | ORAL_CAPSULE | Freq: Three times a day (TID) | ORAL | 0 refills | Status: DC | PRN

## 2023-01-24 MED ORDER — FLUTICASONE PROPIONATE 50 MCG/ACT NA SUSP: 2.0000 | Freq: Every day | NASAL | 0 refills | Status: AC

## 2023-01-24 MED ORDER — SULFAMETHOXAZOLE-TRIMETHOPRIM 800-160 MG PO TABS: 1.0000 | ORAL_TABLET | Freq: Two times a day (BID) | ORAL | 0 refills | Status: AC

## 2023-01-24 NOTE — ED Provider Notes (Signed)
[facility]    CSN: [id number] Arrival date & time: 01/24/23  1053      History   Chief Complaint No chief complaint on file.   HPI [name] is a 89 y.o. female.   The history is provided by the patient.   The patient presents for complaints of cough and urinary tract infection symptoms.  Patient states urinary tract infection symptoms started approximately 4 to 5 days ago.  She complains of chills, fatigue, lower abdominal pain, pain with urination, urgency, frequency, and low back pain.  Patient denies fever, chest pain, nausea, vomiting, diarrhea, hematuria, decreased urine stream, and flank pain.  Patient reports her last UTI was approximately 3 years ago.  With regard to her cough nasal congestion, and runny nose, symptoms started over the past 24 hours.  Patient reports a history of seasonal allergies.  She denies fever, ear pain, sore throat, wheezing, shortness of breath, difficulty breathing, chest pain, nausea, vomiting, or diarrhea.  Patient reports that she takes Zyrtec daily.  She reports that she has been using her albuterol inhaler, and does have an albuterol nebulizer machine.  She reports that symptoms worsened after she went outside yesterday.  She denies any obvious known sick contacts.  Past Medical History:  Diagnosis Date   Ankle swelling    takes torsemide prn   Anxiety    Arthritis    Asthma    allergy induced   Back pain    Complication of anesthesia    COVID 2023   Depression    GERD (gastroesophageal reflux disease)    HOH (hard of hearing)    Hypercholesterolemia    Hypothyroid    Parkinson disease    PONV (postoperative nausea and vomiting)    Seasonal allergies     Patient Active Problem List   Diagnosis Date Noted   Inversion of both nipples 12/18/2022   PHN (postherpetic neuralgia) 12/18/2022   Cellulitis 12/11/2021   Cellulitis of right hand 12/10/2021   Hypocalcemia 12/10/2021   Normocytic anemia 12/10/2021    Depression 12/10/2021   Hypothyroidism 12/10/2021   Edema of left lower extremity 07/10/2021   GERD (gastroesophageal reflux disease)    History of Nissen fundoplication    IBS (irritable bowel syndrome) 02/05/2018   Diarrhea 06/16/2017   N&V (nausea and vomiting) 06/16/2017   Numbness 08/18/2015   Polyneuropathy 08/18/2015   Menopausal syndrome (hot flushes) 06/15/2015   Gastroesophageal reflux disease with esophagitis 06/15/2015   Bilateral carpal tunnel syndrome 06/15/2015   Resting tremor 06/15/2015   Scoliosis of thoracic spine 11/05/2013   Spinal stenosis, unspecified region other than cervical 12/03/2012   Spondylosis of unspecified site without mention of myelopathy 12/03/2012   Parkinson disease (HCC)    Back pain    Seasonal allergies     Past Surgical History:  Procedure Laterality Date   ABDOMINAL HYSTERECTOMY     CARPAL TUNNEL RELEASE Right 10/26/2015   Procedure: CARPAL TUNNEL RELEASE;  Surgeon: Cindee Salt, MD;  Location: [facility];  Service: Orthopedics;  Laterality: Right;   CARPOMETACARPEL SUSPENSION PLASTY Right 10/26/2015   Procedure: RIGHT HAND TRAPEZIECTOMY WITH THUMB METACARPEL SUSPENSION PLASTY;  Surgeon: Cindee Salt, MD;  Location: [facility];  Service: Orthopedics;  Laterality: Right;   CATARACT EXTRACTION W/PHACO  12/23/2011   Procedure: CATARACT EXTRACTION PHACO AND INTRAOCULAR LENS PLACEMENT (IOC);  Surgeon: Susa Simmonds, MD;  Location: [facility];  Service: Ophthalmology;  Laterality: Right;  CDE=5.16   CHOLECYSTECTOMY  EYE SURGERY     cataract extraction of left eye-[facility]-2002   KNEE ARTHROSCOPY     right   NISSEN FUNDOPLICATION     [facility]   TRIGGER FINGER RELEASE Right 10/26/2015   Procedure: RELEASE A-1 PULLEY RIGHT RING FINGER;  Surgeon: Cindee Salt, MD;  Location: [facility];  Service: Orthopedics;  Laterality: Right;    OB History   No obstetric history on file.      Home Medications    Prior  to Admission medications   Medication Sig Start Date End Date Taking? Authorizing Provider  benzonatate (TESSALON PERLES) 100 MG capsule Take 1 capsule (100 mg total) by mouth 3 (three) times daily as needed for cough. 01/24/23  Yes Pinki Rottman-Warren, Sadie Haber, NP  fluticasone (FLONASE) 50 MCG/ACT nasal spray Place 2 sprays into both nostrils daily. 01/24/23  Yes Geri Hepler-Warren, Sadie Haber, NP  sulfamethoxazole-trimethoprim (BACTRIM DS) 800-160 MG tablet Take 1 tablet by mouth 2 (two) times daily for 7 days. 01/24/23 01/31/23 Yes Josiah Wojtaszek-Warren, Sadie Haber, NP  acetaminophen (TYLENOL) 325 MG tablet Take 2 tablets (650 mg total) by mouth every 6 (six) hours as needed for moderate pain. 06/11/21   Wallis Bamberg, PA-C  albuterol (VENTOLIN HFA) 108 (90 Base) MCG/ACT inhaler Inhale 1-2 puffs into the lungs every 6 (six) hours as needed for wheezing or shortness of breath. 07/28/21   Wallis Bamberg, PA-C  ALPRAZolam Prudy Feeler) 0.5 MG tablet Take 0.5 tablets (0.25 mg total) by mouth at bedtime as needed for sleep. 12/03/22   Dohmeier, Porfirio Mylar, MD  carbidopa-levodopa (SINEMET IR) 25-100 MG tablet Take 1 tablet by mouth 4 (four) times daily. 12/03/22   Dohmeier, Porfirio Mylar, MD  cetirizine (ZYRTEC) 10 MG tablet Take 10 mg by mouth daily.    [provider]  escitalopram (LEXAPRO) 10 MG tablet Take 10 mg by mouth daily. 11/26/21   [provider]  famotidine (PEPCID) 20 MG tablet Take one dose before evening meal each day, may take second dose if needed. No more than two per day. 01/01/22   Letta Median, PA-C  gabapentin (NEURONTIN) 100 MG capsule Take 3 capsules (300 mg total) by mouth 3 (three) times daily. 12/18/22   Gilmore Laroche, FNP  ketoconazole (NIZORAL) 2 % cream Apply 1 application topically daily. 06/11/21   Wallis Bamberg, PA-C  levothyroxine (SYNTHROID, LEVOTHROID) 25 MCG tablet Take 25 mcg by mouth daily. 03/09/14   [provider]  Lido-Capsaicin-Men-Methyl Sal (MEDI-PATCH-LIDOCAINE) 0.5-0.035-5-20 %  PTCH Apply once a day to neuralgia area, right abdomen and below the rib cage. 12/03/22   Dohmeier, Porfirio Mylar, MD  lidocaine-prilocaine (EMLA) cream Apply 1 Application topically as needed. 12/03/22   Dohmeier, Porfirio Mylar, MD  Multiple Vitamins-Minerals (ZINC PO) Take by mouth daily.    [provider]  mupirocin ointment (BACTROBAN) 2 % Apply 1 application topically 3 (three) times daily. 06/11/21   Wallis Bamberg, PA-C  potassium chloride (MICRO-K) 10 MEQ CR capsule Take 20 mEq by mouth daily.  08/13/13   [provider]  prednisoLONE acetate (PRED FORTE) 1 % ophthalmic suspension 1 drop daily.    [provider]  torsemide (DEMADEX) 10 MG tablet Take 10 mg by mouth daily. 1/2 to 1 tab daily    [provider]  Vitamin D, Ergocalciferol, (DRISDOL) 1.25 MG (50000 UNIT) CAPS capsule Take 1 capsule (50,000 Units total) by mouth every 7 (seven) days. 12/24/22   Gilmore Laroche, FNP    Family History Family History  Problem Relation Age of Onset  Stroke Mother    COPD Father    Stroke Father    Colon cancer Neg Hx     Social History Social History   Tobacco Use   Smoking status: Never   Smokeless tobacco: Never  Vaping Use   Vaping status: Never Used  Substance Use Topics   Alcohol use: Yes    Alcohol/week: 1.0 standard drink of alcohol    Types: 1 Glasses of wine per week    Comment: occ   Drug use: No     Allergies   Cephalosporins, Latex, Lactose intolerance (gi), Lactulose, Cephalexin, Levofloxacin, Meperidine and related, and Omeprazole   Review of Systems Review of Systems Per HPI  Physical Exam Triage Vital Signs ED Triage Vitals  Encounter Vitals Group     BP 01/24/23 1101 116/67     Systolic BP Percentile --      Diastolic BP Percentile --      Pulse Rate 01/24/23 1101 97     Resp 01/24/23 1101 20     Temp 01/24/23 1101 98.6 F (37 C)     Temp Source 01/24/23 1101 Oral     SpO2 01/24/23 1101 94 %     Weight --      Height --       Head Circumference --      Peak Flow --      Pain Score 01/24/23 1103 5     Pain Loc --      Pain Education --      Exclude from Growth Chart --    No data found.  Updated Vital Signs BP 116/67 (BP Location: Right Arm)   Pulse 97   Temp 98.6 F (37 C) (Oral)   Resp 20   SpO2 94%   Visual Acuity Right Eye Distance:   Left Eye Distance:   Bilateral Distance:    Right Eye Near:   Left Eye Near:    Bilateral Near:     Physical Exam Vitals and nursing note reviewed.  Constitutional:      General: She is not in acute distress.    Appearance: Normal appearance.  HENT:     Head: Normocephalic.     Right Ear: Tympanic membrane, ear canal and external ear normal.     Left Ear: Tympanic membrane, ear canal and external ear normal.     Nose: Congestion present.     Comments: Cobblestoning present to posterior oropharynx    Mouth/Throat:     Mouth: Mucous membranes are moist.     Pharynx: Posterior oropharyngeal erythema present. No oropharyngeal exudate.     Comments: Cobblestoning present to posterior oropharynx Eyes:     Extraocular Movements: Extraocular movements intact.     Conjunctiva/sclera: Conjunctivae normal.     Pupils: Pupils are equal, round, and reactive to light.  Cardiovascular:     Rate and Rhythm: Normal rate and regular rhythm.     Pulses: Normal pulses.     Heart sounds: Normal heart sounds.  Pulmonary:     Effort: Pulmonary effort is normal. No respiratory distress.     Breath sounds: Normal breath sounds. No stridor. No wheezing, rhonchi or rales.  Abdominal:     General: Bowel sounds are normal.     Palpations: Abdomen is soft.     Tenderness: There is abdominal tenderness. There is no right CVA tenderness or left CVA tenderness.  Musculoskeletal:     Cervical back: Normal range of motion.  Lymphadenopathy:  Cervical: No cervical adenopathy.  Skin:    General: Skin is warm and dry.  Neurological:     General: No focal deficit present.      Mental Status: She is alert and oriented to person, place, and time.  Psychiatric:        Mood and Affect: Mood normal.        Behavior: Behavior normal.     UC Treatments / Results  Labs (all labs ordered are listed, but only abnormal results are displayed) Labs Reviewed  POCT URINALYSIS DIP (MANUAL ENTRY) - Abnormal; Notable for the following components:      Result Value   Color, UA orange (*)    Clarity, UA cloudy (*)    Glucose, UA =100 (*)    Spec Grav, UA <=1.005 (*)    Blood, UA moderate (*)    Nitrite, UA Positive (*)    Leukocytes, UA Small (1+) (*)    All other components within normal limits  URINE CULTURE  SARS CORONAVIRUS 2 (TAT 6-24 HRS)    EKG   Radiology No results found.  Procedures Procedures (including critical care time)  Medications Ordered in UC Medications - No data to display  Initial Impression / Assessment and Plan / UC Course  I have reviewed the triage vital signs and the nursing notes.  Pertinent labs & imaging results that were available during my care of the patient were reviewed by me and considered in my medical decision making (see chart for details).  The patient is well-appearing, she is in no acute distress, vital signs are stable.  Urinalysis does show a urinary tract infection.  Urine culture is pending.  Will start patient on Bactrim DS 800/160 mg tablets.  (Review of patient's most recent kidney function results dated 12/23/2022).  With regard to her cough, symptoms appear to have been exacerbated by postnasal drainage, most likely related to seasonal allergies.  COVID test is pending.  Patient is able to receive Paxlovid if her COVID test is positive.  Fluticasone 50 mcg nasal spray for her nasal congestion and allergy symptoms, and Tessalon 100 mg capsules were prescribed for the patient's cough.  Supportive care recommendations were provided and discussed with the patient to include increasing fluids, over-the-counter analgesics,  use of humidifier, and continuing her current allergy medication.  With regard to her urinary symptoms.  Patient was given strict ER follow-up precautions.  Patient is in agreement with this plan of care and verbalizes understanding.  All questions were answered.  Patient stable for discharge.  Final Clinical Impressions(s) / UC Diagnoses   Final diagnoses:  Acute cystitis without hematuria  Allergic rhinitis, unspecified seasonality, unspecified trigger  Encounter for screening for COVID-19     Discharge Instructions      UTI: -The urinalysis shows that you do have a urinary tract infection.  As discussed, urine culture has been ordered to ensure you are being treated with the appropriate antibiotic.  If the medication needs to be changed, you will be contacted. -Take medications as prescribed. -Increase fluids.  Try to drink at least 5-6 8 ounce glasses of water daily. -Tylenol for pain, fever, or general discomfort. -Develop a toileting schedule that will allow you to urinate at least every 2 hours. -Avoid caffeine to include tea, soda, and coffee. -Follow-up in the emergency department if you develop fever, chills, worsening abdominal pain, or other concerns.   For your allergies: -Take medication as prescribed. -Continue your current allergy regimen. -Recommend normal saline  nasal spray throughout the day to help with nasal congestion and runny nose. -Avoid allergy triggers such as mold, dust, or pollen. -If symptoms appear to be worsening, please follow-up in this clinic or with your primary care physician for further evaluation.  Follow-up as needed.     ED Prescriptions     Medication Sig Dispense Auth. Provider   sulfamethoxazole-trimethoprim (BACTRIM DS) 800-160 MG tablet Take 1 tablet by mouth 2 (two) times daily for 7 days. 14 tablet Mykai Wendorf-Warren, Sadie Haber, NP   fluticasone (FLONASE) 50 MCG/ACT nasal spray Place 2 sprays into both nostrils daily. 16 g  Kagan Hietpas-Warren, Sadie Haber, NP   benzonatate (TESSALON PERLES) 100 MG capsule Take 1 capsule (100 mg total) by mouth 3 (three) times daily as needed for cough. 30 capsule Naylene Foell-Warren, Sadie Haber, NP      PDMP not reviewed this encounter.   Abran Cantor, NP 01/24/23 1142

## 2023-01-24 NOTE — ED Triage Notes (Signed)
Pt reports she has a headache, lower abdominal pain, dark urine, burning with urination, and frequent urination x 5 days.    Took azo

## 2023-01-24 NOTE — Discharge Instructions (Signed)
UTI: -The urinalysis shows that you do have a urinary tract infection.  As discussed, urine culture has been ordered to ensure you are being treated with the appropriate antibiotic.  If the medication needs to be changed, you will be contacted. -Take medications as prescribed. -Increase fluids.  Try to drink at least 5-6 8 ounce glasses of water daily. -Tylenol for pain, fever, or general discomfort. -Develop a toileting schedule that will allow you to urinate at least every 2 hours. -Avoid caffeine to include tea, soda, and coffee. -Follow-up in the emergency department if you develop fever, chills, worsening abdominal pain, or other concerns.   For your allergies: -Take medication as prescribed. -Continue your current allergy regimen. -Recommend normal saline nasal spray throughout the day to help with nasal congestion and runny nose. -Avoid allergy triggers such as mold, dust, or pollen. -If symptoms appear to be worsening, please follow-up in this clinic or with your primary care physician for further evaluation.  Follow-up as needed.

## 2023-01-25 LAB — SARS CORONAVIRUS 2 (TAT 6-24 HRS): SARS Coronavirus 2: NEGATIVE

## 2023-01-26 LAB — URINE CULTURE: Culture: 70000 — AB

## 2023-01-27 NOTE — Telephone Encounter (Signed)
Unable to refill per protocol, last refill by another provider not at this practice. Patient is not  established at this practice.  Requested Prescriptions  Pending Prescriptions Disp Refills   fluticasone (FLONASE) 50 MCG/ACT nasal spray [Pharmacy Med Name: FLUTICASONE PROP 50 MCG SPRAY] 48 mL     Sig: PLACE 1 SPRAY INTO BOTH NOSTRILS 2 (TWO) TIMES DAILY     There is no refill protocol information for this order

## 2023-02-21 ENCOUNTER — Other Ambulatory Visit: Payer: Self-pay | Admitting: Adult Health

## 2023-02-21 ENCOUNTER — Other Ambulatory Visit: Payer: Self-pay | Admitting: Family Medicine

## 2023-02-21 DIAGNOSIS — B0229 Other postherpetic nervous system involvement: Secondary | ICD-10-CM

## 2023-02-24 ENCOUNTER — Other Ambulatory Visit: Payer: Self-pay | Admitting: Neurology

## 2023-02-26 NOTE — Telephone Encounter (Signed)
Last seen on 12/03/22 Follow up scheduled on 04/09/23   Next refill due on 03/03/23, Rx sent.

## 2023-03-03 ENCOUNTER — Encounter: Payer: Self-pay | Admitting: Family Medicine

## 2023-03-03 ENCOUNTER — Other Ambulatory Visit: Payer: Self-pay

## 2023-03-03 MED ORDER — ALBUTEROL SULFATE HFA 108 (90 BASE) MCG/ACT IN AERS: 1.0000 | INHALATION_SPRAY | Freq: Four times a day (QID) | RESPIRATORY_TRACT | 0 refills | Status: DC | PRN

## 2023-03-04 ENCOUNTER — Ambulatory Visit (INDEPENDENT_AMBULATORY_CARE_PROVIDER_SITE_OTHER): Payer: Medicare Other

## 2023-03-04 VITALS — BP 120/62 | Ht 62.0 in | Wt 160.0 lb

## 2023-03-04 DIAGNOSIS — Z0000 Encounter for general adult medical examination without abnormal findings: Secondary | ICD-10-CM | POA: Diagnosis not present

## 2023-03-04 NOTE — Patient Instructions (Signed)
[name] , Thank you for taking time to come for your Medicare Wellness Visit. I appreciate your ongoing commitment to your health goals. Please review the following plan we discussed and let me know if I can assist you in the future.   Referrals/Orders/Follow-Ups/Clinician Recommendations:  You have an order for:  []   2D Mammogram  []   3D Mammogram  [x]   Bone Density   []   Lung Cancer Screening  Please call for appointment:   [facility] at [facility] [address]iology [place], [place] [address] [phone]  Make sure to wear two-piece clothing.  No lotions powders or deodorants the day of the appointment Make sure to bring picture ID and insurance card.  Bring list of medications you are currently taking including any supplements.   Schedule your [place] screening mammogram through MyChart!   Log into your MyChart account.  Go to 'Visit' (or 'Appointments' if on mobile App) --> Schedule an Appointment  Under 'Select a Reason for Visit' choose the Mammogram Screening option.  Complete the pre-visit questions and select the time and place that best fits your schedule.  Next Medicare Annual Wellness Visit: 05/26/2024 at 2:30pm virtual visit  You are due for the vaccines checked below. You may have these done at your preferred pharmacy. Please have them fax the office proof of the vaccines so that we can update your chart.   [x]  Flu (due annually)  Recommended this fall either at PCP office or through your local pharmacy. The flu season starts August 1 of each year.   [x]  Shingrix (Shingles vaccine): CDC recommends 2 doses of Shingrix separated by 2-6 months for aged 50 years and older:  [x]  Pneumonia Vaccines: Recommended for adults 65 years or older  []  TDAP (Tetanus) Vaccine every 10 years:Recommended every 10 years; Please call your insurance company to determine your out of pocket expense. You also receive this vaccine at your local pharmacy or  Health Dept.  [x]  Covid-19: Available now at any [facility] pharmacy (see info below)  You may also get your vaccines at any [facility] (locations listed below.) Vaccine hours are Monday - Friday 9:00 - 4:00. No appointments are required. Most insurances are accepted including Medicaid. Anyone can use the community pharmacies, and people are not required to have a [facility] provider.  Community Pharmacy Locations offering vaccines:   Sport and exercise psychologist   [facility]  10 vaccines are offered at the [employer]: Covid, flu, Tdap, shingles, RSV, pneumonia, meningococcal, hepatitis A, hepatitis B, and HPV.     This is a list of the screening recommended for you and due dates:  Health Maintenance  Topic Date Due   Zoster (Shingles) Vaccine (1 of 2) Never done   Pneumonia Vaccine (1 of 1 - PCV) Never done   Flu Shot  01/09/2023   COVID-19 Vaccine (1 - 2023-24 season) Never done   Medicare Annual Wellness Visit  03/03/2024   DTaP/Tdap/Td vaccine (2 - Td or Tdap) 06/14/2031   DEXA scan (bone density measurement)  Completed   HPV Vaccine  Aged Out    Advanced directives: (Copy Requested) Please bring a copy of your health care power of attorney and living will to the office to be added to your chart at your convenience.  Next Medicare Annual Wellness Visit scheduled for next year: Yes  Preventive Care 65  Years and Older, Female Preventive care refers to lifestyle choices and visits with your health care provider that can promote health and wellness. Preventive care visits are also called wellness exams. What can I expect for my preventive care visit? Counseling Your health care provider may ask you questions about your: Medical history, including: Past medical problems. Family medical history. Pregnancy and menstrual history. History of falls. Current health,  including: Memory and ability to understand (cognition). Emotional well-being. Home life and relationship well-being. Sexual activity and sexual health. Lifestyle, including: Alcohol, nicotine or tobacco, and drug use. Access to firearms. Diet, exercise, and sleep habits. Work and work Astronomer. Sunscreen use. Safety issues such as seatbelt and bike helmet use. Physical exam Your health care provider will check your: Height and weight. These may be used to calculate your BMI (body mass index). BMI is a measurement that tells if you are at a healthy weight. Waist circumference. This measures the distance around your waistline. This measurement also tells if you are at a healthy weight and may help predict your risk of certain diseases, such as type 2 diabetes and high blood pressure. Heart rate and blood pressure. Body temperature. Skin for abnormal spots. What immunizations do I need?  Vaccines are usually given at various ages, according to a schedule. Your health care provider will recommend vaccines for you based on your age, medical history, and lifestyle or other factors, such as travel or where you work. What tests do I need? Screening Your health care provider may recommend screening tests for certain conditions. This may include: Lipid and cholesterol levels. Hepatitis C test. Hepatitis B test. HIV (human immunodeficiency virus) test. STI (sexually transmitted infection) testing, if you are at risk. Lung cancer screening. Colorectal cancer screening. Diabetes screening. This is done by checking your blood sugar (glucose) after you have not eaten for a while (fasting). Mammogram. Talk with your health care provider about how often you should have regular mammograms. BRCA-related cancer screening. This may be done if you have a family history of breast, ovarian, tubal, or peritoneal cancers. Bone density scan. This is done to screen for osteoporosis. Talk with your health  care provider about your test results, treatment options, and if necessary, the need for more tests. Follow these instructions at home: Eating and drinking  Eat a diet that includes fresh fruits and vegetables, whole grains, lean protein, and low-fat dairy products. Limit your intake of foods with high amounts of sugar, saturated fats, and salt. Take vitamin and mineral supplements as recommended by your health care provider. Do not drink alcohol if your health care provider tells you not to drink. If you drink alcohol: Limit how much you have to 0-1 drink a day. Know how much alcohol is in your drink. In the [place] one drink equals one 12 oz bottle of beer (355 mL), one 5 oz glass of wine (148 mL), or one 1 oz glass of hard liquor (44 mL). Lifestyle Brush your teeth every morning and night with fluoride toothpaste. Floss one time each day. Exercise for at least 30 minutes 5 or more days each week. Do not use any products that contain nicotine or tobacco. These products include cigarettes, chewing tobacco, and vaping devices, such as e-cigarettes. If you need help quitting, ask your health care provider. Do not use drugs. If you are sexually active, practice safe sex. Use a condom or other form of protection in order to prevent STIs. Take aspirin only as told by your health  care provider. Make sure that you understand how much to take and what form to take. Work with your health care provider to find out whether it is safe and beneficial for you to take aspirin daily. Ask your health care provider if you need to take a cholesterol-lowering medicine (statin). Find healthy ways to manage stress, such as: Meditation, yoga, or listening to music. Journaling. Talking to a trusted person. Spending time with friends and family. Minimize exposure to UV radiation to reduce your risk of skin cancer. Safety Always wear your seat belt while driving or riding in a vehicle. Do not drive: If you have  been drinking alcohol. Do not ride with someone who has been drinking. When you are tired or distracted. While texting. If you have been using any mind-altering substances or drugs. Wear a helmet and other protective equipment during sports activities. If you have firearms in your house, make sure you follow all gun safety procedures. What's next? Visit your health care provider once a year for an annual wellness visit. Ask your health care provider how often you should have your eyes and teeth checked. Stay up to date on all vaccines. This information is not intended to replace advice given to you by your health care provider. Make sure you discuss any questions you have with your health care provider. Document Revised: 11/22/2020 Document Reviewed: 11/22/2020 Elsevier Patient Education  2024 [employer]. Understanding Your Risk for Falls Millions of people have serious injuries from falls each year. It is important to understand your risk of falling. Talk with your health care provider about your risk and what you can do to lower it. If you do have a serious fall, make sure to tell your provider. Falling once raises your risk of falling again. How can falls affect me? Serious injuries from falls are common. These include: Broken bones, such as hip fractures. Head injuries, such as traumatic brain injuries (TBI) or concussions. A fear of falling can cause you to avoid activities and stay at home. This can make your muscles weaker and raise your risk for a fall. What can increase my risk? There are a number of risk factors that increase your risk for falling. The more risk factors you have, the higher your risk of falling. Serious injuries from a fall happen most often to people who are older than 87 years old. Teenagers and young adults ages 27-29 are also at higher risk. Common risk factors include: Weakness in the lower body. Being generally weak or confused due to long-term (chronic)  illness. Dizziness or balance problems. Poor vision. Medicines that cause dizziness or drowsiness. These may include: Medicines for your blood pressure, heart, anxiety, insomnia, or swelling (edema). Pain medicines. Muscle relaxants. Other risk factors include: Drinking alcohol. Having had a fall in the past. Having foot pain or wearing improper footwear. Working at a dangerous job. Having any of the following in your home: Tripping hazards, such as floor clutter or loose rugs. Poor lighting. Pets. Having dementia or memory loss. What actions can I take to lower my risk of falling?     Physical activity Stay physically fit. Do strength and balance exercises. Consider taking a regular class to build strength and balance. Yoga and tai chi are good options. Vision Have your eyes checked every year and your prescription for glasses or contacts updated as needed. Shoes and walking aids Wear non-skid shoes. Wear shoes that have rubber soles and low heels. Do not wear high heels. Do  not walk around the house in socks or slippers. Use a cane or walker as told by your provider. Home safety Attach secure railings on both sides of your stairs. Install grab bars for your bathtub, shower, and toilet. Use a non-skid mat in your bathtub or shower. Attach bath mats securely with double-sided, non-slip rug tape. Use good lighting in all rooms. Keep a flashlight near your bed. Make sure there is a clear path from your bed to the bathroom. Use night-lights. Do not use throw rugs. Make sure all carpeting is taped or tacked down securely. Remove all clutter from walkways and stairways, including extension cords. Repair uneven or broken steps and floors. Avoid walking on icy or slippery surfaces. Walk on the grass instead of on icy or slick sidewalks. Use ice melter to get rid of ice on walkways in the winter. Use a cordless phone. Questions to ask your health care provider Can you help me check  my risk for a fall? Do any of my medicines make me more likely to fall? Should I take a vitamin D supplement? What exercises can I do to improve my strength and balance? Should I make an appointment to have my vision checked? Do I need a bone density test to check for weak bones (osteoporosis)? Would it help to use a cane or a walker? Where to find more information Centers for Disease Control and Prevention, [employer]: TonerPromos.no Community-Based Fall Prevention Programs: TonerPromos.no [employer] on Aging: BaseRingTones.pl Contact a health care provider if: You fall at home. You are afraid of falling at home. You feel weak, drowsy, or dizzy. This information is not intended to replace advice given to you by your health care provider. Make sure you discuss any questions you have with your health care provider. Document Revised: 01/28/2022 Document Reviewed: 01/28/2022 Elsevier Patient Education  2024 [employer].

## 2023-03-04 NOTE — Progress Notes (Signed)
Subjective:   [name] is a 89 y.o. female who presents for Medicare Annual (Subsequent) preventive examination.  Visit Complete: Virtual  I connected with  [name] on 03/04/23 by a audio enabled telemedicine application and verified that I am speaking with the correct person using two identifiers.  Patient Location: Home  Provider Location: Home Office  I discussed the limitations of evaluation and management by telemedicine. The patient expressed understanding and agreed to proceed.  Patient Medicare AWV questionnaire was completed by the patient on n/a; I have confirmed that all information answered by patient is correct and no changes since this date.  Cardiac Risk Factors include: advanced age (>33men, >60 women);dyslipidemia;hypertension;sedentary lifestyle     Objective:    Today's Vitals   03/04/23 1323 03/04/23 1326  BP: 120/62   Weight: 160 lb (72.6 kg)   Height: 5\' 2"  (1.575 m)   PainSc:  5    Body mass index is 29.26 kg/m.     03/04/2023    1:23 PM 12/10/2021    5:00 PM 09/21/2019    2:32 PM 03/16/2019    4:21 PM 03/25/2018   10:28 AM 03/20/2018   11:33 AM 03/10/2018   11:55 AM  Advanced Directives  Does Patient Have a Medical Advance Directive? Yes Yes Yes No Yes Yes Yes  Type of Estate agent of [place];Living will Living will Living will;Healthcare Power of Attorney  Living will;Healthcare Power of Attorney Living will;Healthcare Power of Attorney Living will;Healthcare Power of Attorney  Does patient want to make changes to medical advance directive?  No - Patient declined     No - Patient declined  Copy of Healthcare Power of Attorney in Chart? No - copy requested  No - copy requested  No - copy requested No - copy requested   Would patient like information on creating a medical advance directive?    No - Patient declined       Current Medications (verified) Outpatient Encounter Medications as of 03/04/2023  Medication  Sig   acetaminophen (TYLENOL) 325 MG tablet Take 2 tablets (650 mg total) by mouth every 6 (six) hours as needed for moderate pain.   albuterol (VENTOLIN HFA) 108 (90 Base) MCG/ACT inhaler Inhale 1-2 puffs into the lungs every 6 (six) hours as needed for wheezing or shortness of breath.   ALPRAZolam (XANAX) 0.5 MG tablet Take 0.5 tablets (0.25 mg total) by mouth at bedtime as needed for sleep.   benzonatate (TESSALON PERLES) 100 MG capsule Take 1 capsule (100 mg total) by mouth 3 (three) times daily as needed for cough.   carbidopa-levodopa (SINEMET IR) 25-100 MG tablet TAKE 1 TABLET BY MOUTH 4 TIMES  DAILY   cetirizine (ZYRTEC) 10 MG tablet Take 10 mg by mouth daily.   famotidine (PEPCID) 20 MG tablet Take one dose before evening meal each day, may take second dose if needed. No more than two per day.   fluticasone (FLONASE) 50 MCG/ACT nasal spray Place 2 sprays into both nostrils daily.   gabapentin (NEURONTIN) 100 MG capsule TAKE 3 CAPSULES BY MOUTH 3 TIMES DAILY   ketoconazole (NIZORAL) 2 % cream Apply 1 application topically daily.   levothyroxine (SYNTHROID, LEVOTHROID) 25 MCG tablet Take 25 mcg by mouth daily.   Lido-Capsaicin-Men-Methyl Sal (MEDI-PATCH-LIDOCAINE) 0.5-0.035-5-20 % PTCH Apply once a day to neuralgia area, right abdomen and below the rib cage.   lidocaine-prilocaine (EMLA) cream Apply 1 Application topically as needed.   Multiple Vitamins-Minerals (ZINC PO)  Take by mouth daily.   mupirocin ointment (BACTROBAN) 2 % Apply 1 application topically 3 (three) times daily.   potassium chloride (MICRO-K) 10 MEQ CR capsule Take 20 mEq by mouth daily.    prednisoLONE acetate (PRED FORTE) 1 % ophthalmic suspension 1 drop daily.   torsemide (DEMADEX) 10 MG tablet Take 10 mg by mouth daily. 1/2 to 1 tab daily   Vitamin D, Ergocalciferol, (DRISDOL) 1.25 MG (50000 UNIT) CAPS capsule Take 1 capsule (50,000 Units total) by mouth every 7 (seven) days.   escitalopram (LEXAPRO) 10 MG tablet  Take 10 mg by mouth daily. (Patient not taking: Reported on 03/04/2023)   No facility-administered encounter medications on file as of 03/04/2023.    Allergies (verified) Cephalosporins, Latex, Lactose intolerance (gi), Lactulose, Cephalexin, Levofloxacin, Meperidine and related, and Omeprazole   History: Past Medical History:  Diagnosis Date   Ankle swelling    takes torsemide prn   Anxiety    Arthritis    Asthma    allergy induced   Back pain    Complication of anesthesia    COVID 2023   Depression    GERD (gastroesophageal reflux disease)    HOH (hard of hearing)    Hypercholesterolemia    Hypothyroid    Parkinson disease    PONV (postoperative nausea and vomiting)    Seasonal allergies    Past Surgical History:  Procedure Laterality Date   ABDOMINAL HYSTERECTOMY     CARPAL TUNNEL RELEASE Right 10/26/2015   Procedure: CARPAL TUNNEL RELEASE;  Surgeon: Cindee Salt, MD;  Location: [facility];  Service: Orthopedics;  Laterality: Right;   CARPOMETACARPEL SUSPENSION PLASTY Right 10/26/2015   Procedure: RIGHT HAND TRAPEZIECTOMY WITH THUMB METACARPEL SUSPENSION PLASTY;  Surgeon: Cindee Salt, MD;  Location: [facility];  Service: Orthopedics;  Laterality: Right;   CATARACT EXTRACTION W/PHACO  12/23/2011   Procedure: CATARACT EXTRACTION PHACO AND INTRAOCULAR LENS PLACEMENT (IOC);  Surgeon: Susa Simmonds, MD;  Location: [facility];  Service: Ophthalmology;  Laterality: Right;  CDE=5.16   CHOLECYSTECTOMY     EYE SURGERY     cataract extraction of left eye-[facility]-2002   KNEE ARTHROSCOPY     right   NISSEN FUNDOPLICATION     [facility]   TRIGGER FINGER RELEASE Right 10/26/2015   Procedure: RELEASE A-1 PULLEY RIGHT RING FINGER;  Surgeon: Cindee Salt, MD;  Location: [facility];  Service: Orthopedics;  Laterality: Right;   Family History  Problem Relation Age of Onset   Stroke Mother    COPD Father    Stroke Father    Colon cancer Neg Hx    Social  History   Socioeconomic History   Marital status: Widowed    Spouse name: Not on file   Number of children: 2   Years of education: Not on file   Highest education level: Not on file  Occupational History    Comment: retired  Tobacco Use   Smoking status: Never   Smokeless tobacco: Never  Vaping Use   Vaping status: Never Used  Substance and Sexual Activity   Alcohol use: Yes    Alcohol/week: 1.0 standard drink of alcohol    Types: 1 Glasses of wine per week    Comment: occ   Drug use: No   Sexual activity: Not Currently    Birth control/protection: Surgical  Other Topics Concern   Not on file  Social History Narrative   ** Merged History Encounter ** Patient lives at home alone  and she is widowed.   Retired.    Education one year business course.   Right handed.   Caffeine two cup of coffee daily and 4 cups of sweet tea   Social Determinants of Health   Financial Resource Strain: Low Risk  (03/04/2023)   Overall Financial Resource Strain (CARDIA)    Difficulty of Paying Living Expenses: Not hard at all  Food Insecurity: No Food Insecurity (03/04/2023)   Hunger Vital Sign    Worried About Running Out of Food in the Last Year: Never true    Ran Out of Food in the Last Year: Never true  Transportation Needs: No Transportation Needs (03/04/2023)   PRAPARE - Administrator, Civil Service (Medical): No    Lack of Transportation (Non-Medical): No  Physical Activity: Inactive (03/04/2023)   Exercise Vital Sign    Days of Exercise per Week: 0 days    Minutes of Exercise per Session: 0 min  Stress: No Stress Concern Present (03/04/2023)   [employer] of Occupational Health - Occupational Stress Questionnaire    Feeling of Stress : Not at all  Social Connections: Moderately Integrated (03/04/2023)   Social Connection and Isolation Panel [NHANES]    Frequency of Communication with Friends and Family: More than three times a week    Frequency of Social Gatherings  with Friends and Family: More than three times a week    Attends Religious Services: More than 4 times per year    Active Member of [employer] or Organizations: Yes    Attends Banker Meetings: More than 4 times per year    Marital Status: Widowed    Tobacco Counseling Counseling given: Yes   Clinical Intake:  Pre-visit preparation completed: Yes  Pain : 0-10 Pain Score: 5  Pain Type: Neuropathic pain Pain Location: Abdomen (pt currently has shingles starting from just above naval and wraps around to her back) Pain Orientation: Right, Anterior, Posterior Pain Descriptors / Indicators: Sharp, Burning, Tiring Pain Onset: More than a month ago Pain Frequency: Constant     BMI - recorded: 293.26 Nutritional Status: BMI 25 -29 Overweight Nutritional Risks: None Diabetes: No  How often do you need to have someone help you when you read instructions, pamphlets, or other written materials from your doctor or pharmacy?: 1 - Never  Interpreter Needed?: No  Information entered by :: Abby Jahzier Villalon, CMA   Activities of Daily Living    03/04/2023    1:43 PM  In your present state of health, do you have any difficulty performing the following activities:  Hearing? 1  Comment wears hearing aids  Vision? 0  Difficulty concentrating or making decisions? 0  Walking or climbing stairs? 1  Comment uses cane  Dressing or bathing? 0  Doing errands, shopping? 0  Preparing Food and eating ? N  Using the Toilet? N  In the past six months, have you accidently leaked urine? N  Do you have problems with loss of bowel control? N  Managing your Medications? N  Managing your Finances? N  Housekeeping or managing your Housekeeping? Y  Comment needs help with chores such as vacuuming    Patient Care Team: Gilmore Laroche, FNP as PCP - General (Family Medicine)  Indicate any recent Medical Services you may have received from other than [facility] providers in the past year (date may be  approximate).     Assessment:   This is a routine wellness examination for Jaylani.  Hearing/Vision screen Hearing Screening -  Comments:: Wears hearing aids and is up to date with audiology Vision Screening - Comments:: Wears rx glasses - up to date with routine eye exams  Sees [facility] in [place]   Goals Addressed             This Visit's Progress    Patient Stated       Increase energy so that I can go for longer periods of time without having to rest.        Depression Screen    03/04/2023    1:35 PM 12/18/2022    1:02 PM  PHQ 2/9 Scores  PHQ - 2 Score 1 0  PHQ- 9 Score 6 0    Fall Risk    03/04/2023    1:42 PM 12/18/2022    1:01 PM 09/06/2019   11:41 AM 03/09/2019    9:34 AM 05/25/2018   10:59 AM  Fall Risk   Falls in the past year? 1 0 1 1 1   Number falls in past yr: 1 0 1 0 1  Injury with Fall? 0 0 0 1 0  Risk for fall due to : History of fall(s);Impaired balance/gait;Impaired mobility No Fall Risks   Impaired balance/gait;History of fall(s);Impaired mobility  Follow up Education provided;Falls prevention discussed Falls evaluation completed   Education provided    MEDICARE RISK AT HOME: Medicare Risk at Home Any stairs in or around the home?: No If so, are there any without handrails?: No Home free of loose throw rugs in walkways, pet beds, electrical cords, etc?: Yes Adequate lighting in your home to reduce risk of falls?: Yes Life alert?: Yes Use of a cane, walker or w/c?: Yes Grab bars in the bathroom?: Yes Shower chair or bench in shower?: Yes Elevated toilet seat or a handicapped toilet?: Yes  TIMED UP AND GO:  Was the test performed?  No    Cognitive Function:        03/04/2023    1:32 PM  6CIT Screen  What Year? 0 points  What month? 0 points  What time? 0 points  Count back from 20 0 points  Months in reverse 0 points  Repeat phrase 2 points  Total Score 2 points    Immunizations Immunization History  Administered  Date(s) Administered   Fluad Quad(high Dose 65+) 03/27/2019   Influenza, High Dose Seasonal PF 04/04/2018   Tdap 06/13/2021    TDAP status: Up to date  Flu Vaccine status: Due, Education has been provided regarding the importance of this vaccine. Advised may receive this vaccine at local pharmacy or Health Dept. Aware to provide a copy of the vaccination record if obtained from local pharmacy or Health Dept. Verbalized acceptance and understanding.  Pneumococcal vaccine status: Due, Education has been provided regarding the importance of this vaccine. Advised may receive this vaccine at local pharmacy or Health Dept. Aware to provide a copy of the vaccination record if obtained from local pharmacy or Health Dept. Verbalized acceptance and understanding.  Covid-19 vaccine status: Information provided on how to obtain vaccines.   Qualifies for Shingles Vaccine? Yes   Zostavax completed No   Shingrix Completed?: No.    Education has been provided regarding the importance of this vaccine. Patient has been advised to call insurance company to determine out of pocket expense if they have not yet received this vaccine. Advised may also receive vaccine at local pharmacy or Health Dept. Verbalized acceptance and understanding.  Screening Tests Health Maintenance  Topic  Date Due   Medicare Annual Wellness (AWV)  Never done   Zoster Vaccines- Shingrix (1 of 2) Never done   Pneumonia Vaccine 54+ Years old (1 of 1 - PCV) Never done   INFLUENZA VACCINE  01/09/2023   COVID-19 Vaccine (1 - 2023-24 season) Never done   DTaP/Tdap/Td (2 - Td or Tdap) 06/14/2031   DEXA SCAN  Completed   HPV VACCINES  Aged Out    Health Maintenance  Health Maintenance Due  Topic Date Due   Medicare Annual Wellness (AWV)  Never done   Zoster Vaccines- Shingrix (1 of 2) Never done   Pneumonia Vaccine 28+ Years old (1 of 1 - PCV) Never done   INFLUENZA VACCINE  01/09/2023   COVID-19 Vaccine (1 - 2023-24 season) Never  done    Colorectal cancer screening: No longer required.   Mammogram status: No longer required due to age.  Bone Density status: Ordered 12/18/2022. Pt provided with contact info and advised to call to schedule appt.  Lung Cancer Screening: (Low Dose CT Chest recommended if Age 71-80 years, 20 pack-year currently smoking OR have quit w/in 15years.) does not qualify.   Lung Cancer Screening Referral: na  Additional Screening:  Hepatitis C Screening: does not qualify;   Vision Screening: Recommended annual ophthalmology exams for early detection of glaucoma and other disorders of the eye. Is the patient up to date with their annual eye exam?  Yes  Who is the provider or what is the name of the office in which the patient attends annual eye exams? [facility] If pt is not established with a provider, would they like to be referred to a provider to establish care? No .   Dental Screening: Recommended annual dental exams for proper oral hygiene  Diabetic Foot Exam: na  Community Resource Referral / Chronic Care Management: CRR required this visit?  No   CCM required this visit?  No     Plan:     I have personally reviewed and noted the following in the patient's chart:   Medical and social history Use of alcohol, tobacco or illicit drugs  Current medications and supplements including opioid prescriptions. Patient is not currently taking opioid prescriptions. Functional ability and status Nutritional status Physical activity Advanced directives List of other physicians Hospitalizations, surgeries, and ER visits in previous 12 months Vitals Screenings to include cognitive, depression, and falls Referrals and appointments  In addition, I have reviewed and discussed with patient certain preventive protocols, quality metrics, and best practice recommendations. A written personalized care plan for preventive services as well as general preventive health recommendations were  provided to patient.     Jordan Hawks Giovanna Kemmerer, CMA   03/04/2023   After Visit Summary: (MyChart) Due to this being a telephonic visit, the after visit summary with patients personalized plan was offered to patient via MyChart   Nurse Notes:

## 2023-04-02 ENCOUNTER — Telehealth: Payer: Self-pay | Admitting: Neurology

## 2023-04-02 NOTE — Telephone Encounter (Signed)
Pt said want to make the neurologist aware; oing estate planning,attorney, Inetta Fermo will be sending a letter in requesting competency.

## 2023-04-09 ENCOUNTER — Encounter: Payer: Self-pay | Admitting: Neurology

## 2023-04-09 ENCOUNTER — Ambulatory Visit: Payer: Medicare Other | Admitting: Neurology

## 2023-04-09 VITALS — BP 132/84 | HR 78 | Ht 60.0 in | Wt 156.0 lb

## 2023-04-09 DIAGNOSIS — G20A1 Parkinson's disease without dyskinesia, without mention of fluctuations: Secondary | ICD-10-CM

## 2023-04-09 DIAGNOSIS — M4124 Other idiopathic scoliosis, thoracic region: Secondary | ICD-10-CM

## 2023-04-09 DIAGNOSIS — B0229 Other postherpetic nervous system involvement: Secondary | ICD-10-CM

## 2023-04-09 MED ORDER — CARBIDOPA-LEVODOPA 25-100 MG PO TABS: 1.0000 | ORAL_TABLET | Freq: Four times a day (QID) | ORAL | 2 refills | Status: DC

## 2023-04-09 NOTE — Progress Notes (Signed)
Provider:  Melvyn Novas, MD  Primary Care Physician:  Gilmore Laroche, FNP [address] [place] [address]     Referring Provider: Medicine, [facility] [address] [place],  [place] [address]          Chief Complaint according to patient   Patient presents with:                HISTORY OF PRESENT ILLNESS:  [name] is a 89 y.o. female patient who is followed here for PD ; Parkinson's disease without dyskinesia, with fluctuating manifestations , hypothyroidism, GERD. She has been using a cane and is notably more stooped when walking;  gait disorder, postherpetic flank pain.  04/09/2023 : Chief concern according to patient :  I have been doing better in terms of herpetic pain,  but more chin and jaw tremor, titubation.  Doing well overall on Sinemet  25/ 100 mg qid.  BTW: her ankle edema has much improved.      12-03-2022:  Chief concern according to patient :  Pt here with daughter, rm 2. She is here today for parkinsons follow up. Had shingles since April 24, and is still in pain- affecting her sleep- started on the upper abdomen , involving the naval,  halfway around the right body.   She continues the carbidopa levo 25/100 mg 4 times a day. Overall has had a lot of other health issues that are happening. She is concerned  about gabapentin clouding her mind and memory. She has been miserable. We discussed EMLA cream and Lidocaine patches to help topically.  She is notably more stooped.     Recent ED visit. "The patient presents for complaints of cough and urinary tract infection symptoms.  Patient states urinary tract infection symptoms started approximately 4 to 5 days ago.  She complains of chills, fatigue, lower abdominal pain, pain with urination, urgency, frequency, and low back pain.  Patient denies fever, chest pain, nausea, vomiting, diarrhea, hematuria, decreased urine stream, and flank pain.  Patient reports her last UTI was  approximately 3 years ago".   With regard to her cough nasal congestion, and runny nose, symptoms started over the past 24 hours.  Patient reports a history of seasonal allergies.  She denies fever, ear pain, sore throat, wheezing, shortness of breath, difficulty breathing, chest pain, nausea, vomiting, or diarrhea.  Patient reports that she takes Zyrtec daily.  She reports that she has been using her albuterol inhaler, and does have an albuterol nebulizer machine.  She reports that symptoms worsened after she went outside yesterday.  She denies any obvious known sick contacts.    03/11/22 : She had a fall going up a step right after Christmas. She didn't have any broken bones but it took her awhile to get over it. She fell since then as well. She believes she is unsteady and balance is worse but doesn't feel any other symptoms have worsened. She lives alone and does everything for herselfMs. Torres is an 89 year old year female with a parkinson disease. She returns today for follow-up.  She has not been seen since 2021. Has had several falls, fortunately has not had significant injuries. Uses a cane or walking stick when ambulating.  Does get chocked with food and liquids. Very sporadic.  Does not occur every time she eats. Tremor is mild in the upper extremities. Doesn't sleep well. Has trouble falling asleep but once a  asleep will sleep all night.  Continues on Sinemet 4 times a day   09/06/19: [name] is an 89 year old female with a history of Parkinson's disease.  She returns today for follow-up.  She states that she was in physical therapy up until December.  She states that she was doing well. She states that she stopped therapy around Christmas due to increase in COVID-19 cases in [place].  She would like to restart therapy now.  She reports that she did have a recent fall on her back deck.  Reports that her tremor is stable.  Reports on occasion she may get choked when trying to swallow  food particularly cornbread.  But this is not consistent.  She returns today for an evaluation.     HISTORY [name] is an 89 year old female with Parkinson's disease.  She returns today for follow-up.  She states that she noticed over the summer that the heat affects her Parkinson's.  Therefore she tries to stay indoors.  She states that her biggest issue is staying asleep.  She states that some nights she does not go to bed till after midnight.  And even then she may wake up several times.  She states that she tried taking Xanax before bed but it makes her sleep a lot.  She states in the past she is also tried melatonin but took it right before bedtime.  She continues on Sinemet but reports that she typically takes it 3 times a day instead of 4.  Reports that there are times that her balance has been off.  She has reported some falls.  She states that she typically falls forward.  Fortunately she is not suffered any significant injuries.  She states that she has been trying to use her cane more.  Denies any trouble eating.  Reports that she does eat slower than before.  She returns today for evaluation.  Review of Systems: Out of a complete 14 system review, the patient complains of only the following symptoms, and all other reviewed systems are negative.:  Fatigue, sleepiness ,   Allergic asthma, using albuterol.   Postherpetic neuralgia , right posterior , flank and lower thorac nerves.  Social History   Socioeconomic History   Marital status: Widowed    Spouse name: Not on file   Number of children: 2   Years of education: Not on file   Highest education level: Not on file  Occupational History    Comment: retired  Tobacco Use   Smoking status: Never   Smokeless tobacco: Never  Vaping Use   Vaping status: Never Used  Substance and Sexual Activity   Alcohol use: Yes    Alcohol/week: 1.0 standard drink of alcohol    Types: 1 Glasses of wine per week    Comment: occ   Drug use:  No   Sexual activity: Not Currently    Birth control/protection: Surgical  Other Topics Concern   Not on file  Social History Narrative   ** Merged History Encounter ** Patient lives at home alone and she is widowed.   Retired.    Education one year business course.   Right handed.   Caffeine two cup of coffee daily and 4 cups of sweet tea   Social Determinants of Health   Financial Resource Strain: Low Risk  (03/04/2023)   Overall Financial Resource Strain (CARDIA)    Difficulty of Paying Living Expenses: Not hard at all  Food Insecurity: No Food Insecurity (  03/04/2023)   Hunger Vital Sign    Worried About Running Out of Food in the Last Year: Never true    Ran Out of Food in the Last Year: Never true  Transportation Needs: No Transportation Needs (03/04/2023)   PRAPARE - Administrator, Civil Service (Medical): No    Lack of Transportation (Non-Medical): No  Physical Activity: Inactive (03/04/2023)   Exercise Vital Sign    Days of Exercise per Week: 0 days    Minutes of Exercise per Session: 0 min  Stress: No Stress Concern Present (03/04/2023)   [employer] of Occupational Health - Occupational Stress Questionnaire    Feeling of Stress : Not at all  Social Connections: Moderately Integrated (03/04/2023)   Social Connection and Isolation Panel [NHANES]    Frequency of Communication with Friends and Family: More than three times a week    Frequency of Social Gatherings with Friends and Family: More than three times a week    Attends Religious Services: More than 4 times per year    Active Member of [employer] or Organizations: Yes    Attends Banker Meetings: More than 4 times per year    Marital Status: Widowed    Family History  Problem Relation Age of Onset   Stroke Mother    COPD Father    Stroke Father    Colon cancer Neg Hx     Past Medical History:  Diagnosis Date   Ankle swelling    takes torsemide prn   Anxiety    Arthritis     Asthma    allergy induced   Back pain    Complication of anesthesia    COVID 2023   Depression    GERD (gastroesophageal reflux disease)    HOH (hard of hearing)    Hypercholesterolemia    Hypothyroid    Parkinson disease (HCC)    PONV (postoperative nausea and vomiting)    Seasonal allergies     Past Surgical History:  Procedure Laterality Date   ABDOMINAL HYSTERECTOMY     CARPAL TUNNEL RELEASE Right 10/26/2015   Procedure: CARPAL TUNNEL RELEASE;  Surgeon: Cindee Salt, MD;  Location: [facility];  Service: Orthopedics;  Laterality: Right;   CARPOMETACARPEL SUSPENSION PLASTY Right 10/26/2015   Procedure: RIGHT HAND TRAPEZIECTOMY WITH THUMB METACARPEL SUSPENSION PLASTY;  Surgeon: Cindee Salt, MD;  Location: [facility];  Service: Orthopedics;  Laterality: Right;   CATARACT EXTRACTION W/PHACO  12/23/2011   Procedure: CATARACT EXTRACTION PHACO AND INTRAOCULAR LENS PLACEMENT (IOC);  Surgeon: Susa Simmonds, MD;  Location: [facility];  Service: Ophthalmology;  Laterality: Right;  CDE=5.16   CHOLECYSTECTOMY     EYE SURGERY     cataract extraction of left eye-[facility]-2002   KNEE ARTHROSCOPY     right   NISSEN FUNDOPLICATION     [facility]   TRIGGER FINGER RELEASE Right 10/26/2015   Procedure: RELEASE A-1 PULLEY RIGHT RING FINGER;  Surgeon: Cindee Salt, MD;  Location: [facility];  Service: Orthopedics;  Laterality: Right;     Current Outpatient Medications on File Prior to Visit  Medication Sig Dispense Refill   acetaminophen (TYLENOL) 325 MG tablet Take 2 tablets (650 mg total) by mouth every 6 (six) hours as needed for moderate pain. 30 tablet 0   albuterol (VENTOLIN HFA) 108 (90 Base) MCG/ACT inhaler Inhale 1-2 puffs into the lungs every 6 (six) hours as needed for wheezing or shortness of breath. 18 g 0  ALPRAZolam (XANAX) 0.5 MG tablet Take 0.5 tablets (0.25 mg total) by mouth at bedtime as needed for sleep. 30 tablet 0   benzonatate (TESSALON PERLES) 100  MG capsule Take 1 capsule (100 mg total) by mouth 3 (three) times daily as needed for cough. 30 capsule 0   carbidopa-levodopa (SINEMET IR) 25-100 MG tablet TAKE 1 TABLET BY MOUTH 4 TIMES  DAILY 360 tablet 0   cetirizine (ZYRTEC) 10 MG tablet Take 10 mg by mouth daily.     famotidine (PEPCID) 20 MG tablet Take one dose before evening meal each day, may take second dose if needed. No more than two per day. 60 tablet 5   fluticasone (FLONASE) 50 MCG/ACT nasal spray Place 2 sprays into both nostrils daily. 16 g 0   gabapentin (NEURONTIN) 100 MG capsule TAKE 3 CAPSULES BY MOUTH 3 TIMES DAILY 480 capsule 5   ketoconazole (NIZORAL) 2 % cream Apply 1 application topically daily. 30 g 0   levothyroxine (SYNTHROID, LEVOTHROID) 25 MCG tablet Take 25 mcg by mouth daily.     Lido-Capsaicin-Men-Methyl Sal (MEDI-PATCH-LIDOCAINE) 0.5-0.035-5-20 % PTCH Apply once a day to neuralgia area, right abdomen and below the rib cage. 30 patch 1   lidocaine-prilocaine (EMLA) cream Apply 1 Application topically as needed. 30 g 5   Multiple Vitamins-Minerals (ZINC PO) Take by mouth daily.     potassium chloride (MICRO-K) 10 MEQ CR capsule Take 20 mEq by mouth daily.      prednisoLONE acetate (PRED FORTE) 1 % ophthalmic suspension 1 drop daily.     torsemide (DEMADEX) 10 MG tablet Take 10 mg by mouth daily. 1/2 to 1 tab daily     Vitamin D, Ergocalciferol, (DRISDOL) 1.25 MG (50000 UNIT) CAPS capsule Take 1 capsule (50,000 Units total) by mouth every 7 (seven) days. 20 capsule 1   escitalopram (LEXAPRO) 10 MG tablet Take 10 mg by mouth daily. (Patient not taking: Reported on 04/09/2023)     No current facility-administered medications on file prior to visit.    Allergies  Allergen Reactions   Cephalosporins Rash   Latex Rash    Breaks out then gets infected   Lactose Intolerance (Gi) Nausea And Vomiting   Lactulose Nausea And Vomiting   Cephalexin Rash   Levofloxacin Anxiety and Rash   Meperidine And Related Nausea  And Vomiting    Needed Phenergan to combat nausea.   Omeprazole Rash     DIAGNOSTIC DATA (LABS, IMAGING, TESTING) - I reviewed patient records, labs, notes, testing and imaging myself where available.  Lab Results  Component Value Date   WBC 5.0 12/23/2022   HGB 12.5 12/23/2022   HCT 38.0 12/23/2022   MCV 93 12/23/2022   PLT 247 12/23/2022      Component Value Date/Time   NA 143 12/23/2022 1033   K 4.8 12/23/2022 1033   CL 108 (H) 12/23/2022 1033   CO2 21 12/23/2022 1033   GLUCOSE 81 12/23/2022 1033   GLUCOSE 103 (H) 12/11/2021 0443   BUN 11 12/23/2022 1033   CREATININE 0.82 12/23/2022 1033   CALCIUM 9.1 12/23/2022 1033   PROT 6.2 12/23/2022 1033   ALBUMIN 4.2 12/23/2022 1033   AST 14 12/23/2022 1033   ALT 9 12/23/2022 1033   ALKPHOS 82 12/23/2022 1033   BILITOT 0.5 12/23/2022 1033   GFRNONAA >60 12/11/2021 0443   GFRAA >60 10/24/2015 1038   Lab Results  Component Value Date   CHOL 180 12/23/2022   HDL 54 12/23/2022   LDLCALC  110 (H) 12/23/2022   TRIG 85 12/23/2022   CHOLHDL 3.3 12/23/2022   Lab Results  Component Value Date   HGBA1C 6.2 (H) 12/23/2022   No results found for: "VITAMINB12" Lab Results  Component Value Date   TSH 3.630 12/23/2022    PHYSICAL EXAM:  Today's Vitals   04/09/23 1311  BP: 132/84  Pulse: 78  Weight: 156 lb (70.8 kg)  Height: 5' (1.524 m)   Body mass index is 30.47 kg/m.   Wt Readings from Last 3 Encounters:  04/09/23 156 lb (70.8 kg)  03/04/23 160 lb (72.6 kg)  12/18/22 162 lb (73.5 kg)     Ht Readings from Last 3 Encounters:  04/09/23 5' (1.524 m)  03/04/23 5\' 2"  (1.575 m)  12/18/22 5\' 2"  (1.575 m)      General: The patient is awake, alert and appears not in acute distress. The patient is well groomed. Head: Normocephalic, atraumatic. Neck is supple.  Dental status:  Cardiovascular:  Regular rate and cardiac rhythm by pulse,  without distended neck veins. Respiratory: Lungs are clear to auscultation.  Skin:   With evidence of mild ankle edema, or rash. Hot to touch.  Trunk: The patient's posture is stooped    NEUROLOGIC EXAM: The patient is awake and alert, oriented to place and time.   Memory subjective described as intact.  Attention span & concentration ability appears normal.  Speech is fluent,  without dysarthria, with dysphonia , no aphasia.  Mood and affect are appropriate.   Cranial nerves: no loss of smell or taste reported  Pupils are equal and briskly reactive to light. Funduscopic exam deferred.  Extraocular movements in vertical and horizontal planes were intact and without nystagmus. No Diplopia. Visual fields by finger perimetry are intact. Hearing was intact to soft voice and finger rubbing.    Facial sensation intact to fine touch.  Facial motor strength is symmetric and tongue and uvula move midline.  Neck ROM : head drop- stooped - this is not a cervical  dystonia but a stopped posture with anticolli.  rotation, tilt and flexion extension were severely limited.  shoulder shrug was symmetrical.    Motor exam:  Symmetric bulk, tone and ROM.   Normal tone with cog wheeling, but with symmetric loss of grip strength .Mild resting tremor in the upper extremities. Right greater than left ,   Right hip flexion strength is greater than left ,  knee extension is intact.    Sensory:  Fine touch, pinprick and vibration were intact  carpal tunnel on the left is present, bilateral weak grip strength  Proprioception tested in the upper extremities was abnormal, pronator drift.    Coordination: Rapid alternating movements in the fingers/hands were of normal speed.  The Finger-to-nose maneuver was impaired : no evidence of ataxia, dysmetria but with a low amplitude tremor.    She presented with chin drop to the chest    Gait and station: Patient could barely rise unassisted from a seated position, but walked with a cane ,stooped- this has been her only assistive device.  Arm-swing on  the right remains preserved.  Stance is of normal width/ base and the patient turned with 4. 5 steps.  Toe and heel walk were deferred.  Deep tendon reflexes: in the  upper and lower extremities are symmetric.     ASSESSMENT AND PLAN 89  year old female PD patient , here with:    1) slow progression of stooped posture and slowing of gait -  slow progressive hip flexion weakness.  2)Suffers from postherpetic neuralgia- in Th 10 right side, improvement in  pain by using lidocaine patches, EMLA cream and gabapentin.  3) Osteoporotic neck stoop - and cervicalgia, tension neck pain. Its not ante colli- not a dystonia , it's arthritis, DDD and osteoporotic changes in conjunction with parkinsonian stoop.    I plan to follow up either personally or through our NP within 6 months.   I would like to thank Gilmore Laroche, FNP or allowing me to meet with and to take care of this pleasant patient.   After spending a total time of  35  minutes face to face and additional time for physical and neurologic examination, review of laboratory studies,  personal review of imaging studies, reports and results of other testing and review of referral information / records as far as provided in visit,   Electronically signed by: Melvyn Novas, MD 04/09/2023 1:41 PM  [facility] Board certified by The [employer] of Sleep Medicine and Diplomate of the [employer] of Sleep Medicine. Board certified In Neurology through the ABPN, Fellow of the [employer] of Neu[employer].

## 2023-04-09 NOTE — Patient Instructions (Signed)
Carbidopa; Levodopa Tablets What is this medication? CARBIDOPA; LEVODOPA (kar bi DOE pa; lee voe DOE pa) treats the symptoms of Parkinson disease. It works by increasing the amount of dopamine in your brain, a substance which helps manage body movements and coordination. This reduces the symptoms of Parkinson, such as body stiffness and tremors. This medicine may be used for other purposes; ask your health care provider or pharmacist if you have questions. COMMON BRAND NAME(S): Atamet, Dhivy, SINEMET What should I tell my care team before I take this medication? They need to know if you have any of these conditions: Depression or other mental health conditions Diabetes Glaucoma Heart disease, including history of a heart attack History of irregular heartbeat Kidney disease Liver disease Lung or breathing disease, such as asthma Narcolepsy Sleep apnea Stomach or intestine problems An unusual or allergic reaction to levodopa, carbidopa, other medications, foods, dyes, or preservatives Pregnant or trying to get pregnant Breastfeeding How should I use this medication? Take this medication by mouth with a glass of water. Follow the directions on the prescription label. Take your doses at regular intervals. Do not take your medication more often than directed. Do not stop taking except on the advice of your care team. One form of this medication is a breakable tablet. If your care team has prescribed this, this medication comes with INSTRUCTIONS FOR USE. Ask your pharmacist for directions on how to use this medicine. Read the information carefully. Talk to your pharmacist or care team if you have questions. Talk to your care team about the use of this medication in children. Special care may be needed. Overdosage: If you think you have taken too much of this medicine contact a poison control center or emergency room at once. NOTE: This medicine is only for you. Do not share this medicine with  others. What if I miss a dose? If you miss a dose, take it as soon as you can. If it is almost time for your next dose, take only that dose. Do not take double or extra doses. What may interact with this medication? Do not take this medication with any of the following: MAOIs, such as Marplan, Nardil, and Parnate Reserpine Tetrabenazine This medication may also interact with the following: Alcohol Droperidol Entacapone Iron supplements or multivitamins with iron Isoniazid, INH Linezolid Medications for blood pressure Medications for mental health conditions Medications that help you fall asleep Metoclopramide Papaverine Procarbazine Tedizolid Rasagiline Selegiline Tolcapone This list may not describe all possible interactions. Give your health care provider a list of all the medicines, herbs, non-prescription drugs, or dietary supplements you use. Also tell them if you smoke, drink alcohol, or use illegal drugs. Some items may interact with your medicine. What should I watch for while using this medication? Visit your care team for regular checks on your progress. Tell your care team if your symptoms do not start to get better or if they get worse. A severe reaction similar to neuroleptic malignant syndrome (NMS) may occur if you reduce the dose of or stop taking this medication too quickly. Symptoms of NMS include high fever, stiff muscles, increased sweating, fast or irregular heartbeat, and confusion. Contact your care team right away if think you have NMS. This medication may affect your coordination, reaction time, or judgment. Do not drive or operate machinery until you know how this medication affects you. Sit up or stand slowly to reduce the risk of dizzy or fainting spells. Drinking alcohol with this medication can increase  the risk of these side effects. When taking this medication, you may fall asleep without notice. You may be doing activities, such as driving a car,  talking, or eating. You may not feel drowsy before it happens. Contact your care team right away if this happens to you. There have been reports of increased sexual urges or other strong urges, such as gambling while taking this medication. If you experience any of these while taking this medication, you should report this to your care team as soon as possible. You may experience a 'wearing off' effect before it is time to take your next dose of this medication. You may also experience an 'on-off' effect where the medication seems to stop working for minutes to hours, then suddenly starts working again. Tell your care team if this happens to you. Your dose may need to be adjusted. Eating high protein foods may affect how this medication works. Tell your care team if you change your diet. If you have diabetes, you may get a false-positive result for sugar in your urine. Check with your care team. This medication can make your saliva, sweat, or urine look dark red or black. This is normal but may stain clothing or fabrics. This medication may cause low levels of vitamin B6 in your body. Make sure that you get enough vitamin B6 while you are taking this medication. Discuss the foods you eat and the vitamins you take with your care team. What side effects may I notice from receiving this medication? Side effects that you should report to your care team as soon as possible: Allergic reactions--skin rash, itching, hives, swelling of the face, lips, tongue, or throat Falling asleep during daily activities Heart rhythm changes--fast or irregular heartbeat, dizziness, feeling faint or lightheaded, chest pain, trouble breathing Low blood pressure--dizziness, feeling faint or lightheaded, blurry vision Mood and behavior changes--anxiety, nervousness, confusion, hallucinations, irritability, hostility, thoughts of suicide or self-harm, worsening mood, feelings of depression New or worsening uncontrolled and  repetitive movements of the face, mouth, or upper body Stomach bleeding--bloody or black, tar-like stools, vomiting blood or brown material that looks like coffee grounds Sudden eye pain or change in vision such as blurry vision, seeing halos around lights, vision loss Urges to engage in impulsive behaviors such as gambling, binge eating, sexual activity, or shopping in ways that are unusual for you Side effects that usually do not require medical attention (report to your care team if they continue or are bothersome): Dark red or black saliva, sweat, or urine Dizziness Drowsiness Headache Nausea This list may not describe all possible side effects. Call your doctor for medical advice about side effects. You may report side effects to FDA at 1-800-FDA-1088. Where should I keep my medication? Keep out of the reach of children. Store at room temperature between 15 and 30 degrees C (59 and 86 degrees F). Protect from light. Throw away any unused medication after the expiration date. NOTE: This sheet is a summary. It may not cover all possible information. If you have questions about this medicine, talk to your doctor, pharmacist, or health care provider.  2024 Elsevier/Gold Standard (2021-12-20 00:00:00)

## 2023-05-13 ENCOUNTER — Other Ambulatory Visit: Payer: Self-pay

## 2023-05-13 ENCOUNTER — Encounter: Payer: Self-pay | Admitting: Family Medicine

## 2023-05-13 NOTE — Telephone Encounter (Signed)
I have updated the med in her chart I can send to pharmacy if ok?

## 2023-05-14 NOTE — Telephone Encounter (Signed)
Yes, please.

## 2023-05-15 ENCOUNTER — Other Ambulatory Visit: Payer: Self-pay

## 2023-05-15 DIAGNOSIS — J45909 Unspecified asthma, uncomplicated: Secondary | ICD-10-CM

## 2023-05-15 MED ORDER — FLUTICASONE PROPIONATE HFA 220 MCG/ACT IN AERO: 2.0000 | INHALATION_SPRAY | Freq: Two times a day (BID) | RESPIRATORY_TRACT | 12 refills | Status: DC

## 2023-05-15 NOTE — Telephone Encounter (Signed)
Rx sent 

## 2023-06-07 ENCOUNTER — Ambulatory Visit: Payer: Medicare Other

## 2023-06-26 ENCOUNTER — Other Ambulatory Visit: Payer: Self-pay | Admitting: Family Medicine

## 2023-06-26 DIAGNOSIS — J45909 Unspecified asthma, uncomplicated: Secondary | ICD-10-CM

## 2023-06-26 MED ORDER — FLUTICASONE PROPIONATE HFA 220 MCG/ACT IN AERO: 2.0000 | INHALATION_SPRAY | Freq: Two times a day (BID) | RESPIRATORY_TRACT | 12 refills | Status: DC

## 2023-06-26 MED ORDER — FLUTICASONE PROPIONATE HFA 220 MCG/ACT IN AERO: 2.0000 | INHALATION_SPRAY | Freq: Two times a day (BID) | RESPIRATORY_TRACT | 0 refills | Status: DC

## 2023-06-26 NOTE — Telephone Encounter (Signed)
Copied from CRM [id number]. Topic: Clinical - Medication Refill >> Jun 26, 2023 10:08 AM [name] wrote: Most Recent Primary Care Visit:  Provider: Recardo Evangelist A  Department: [facility]  Visit Type: MEDICARE AWV, INITIAL  Date: 03/04/2023  Medication:  fluticasone (FLOVENT HFA) 220 MCG/ACT inhaler   Has the patient contacted their pharmacy? Yes (Agent: If no, request that the patient contact the pharmacy for the refill. If patient does not wish to contact the pharmacy document the reason why and proceed with request.) (Agent: If yes, when and what did the pharmacy advise?)yes, but she realized it was the albuterol they had filled and not her Flovent  Is this the correct pharmacy for this prescription? Yes If no, delete pharmacy and type the correct one.  This is the patient's preferred pharmacy:   [id number] - [place], [place] - [address] [place] [address] Phone: [phone] Fax: [phone]   [facility] - [place], [place] - [address] [place]  [address] Phone: [phone] Fax: [phone]    Has the prescription been filled recently? Yes  Is the patient out of the medication? Yes  Has the patient been seen for an appointment in the last year OR does the patient have an upcoming appointment? Yes  Can we respond through MyChart? Yes  Agent: Please be advised that Rx refills may take up to 3 business days. We ask that you follow-up with your pharmacy.

## 2023-06-26 NOTE — Telephone Encounter (Signed)
Copied from CRM [id number]. Topic: Clinical - Medication Refill >> Jun 26, 2023 10:08 AM Hector Shade B wrote: Most Recent Primary Care Visit:  Provider: Recardo Evangelist A  Department: [facility]  Visit Type: MEDICARE AWV, INITIAL  Date: 03/04/2023  Medication:  fluticasone (FLOVENT HFA) 220 MCG/ACT inhaler   Has the patient contacted their pharmacy? Yes (Agent: If no, request that the patient contact the pharmacy for the refill. If patient does not wish to contact the pharmacy document the reason why and proceed with request.) (Agent: If yes, when and what did the pharmacy advise?)yes, but she realized it was the albuterol they had filled and not her Flovent  Is this the correct pharmacy for this prescription? Yes If no, delete pharmacy and type the correct one.  This is the patient's preferred pharmacy:   [id number] - [place], [place] - [address] [facility] [address] [place] [address] Phone: [phone] Fax: [phone]   [facility] - [place], [place] - [address] [place]  [address] Phone: [phone] Fax: [phone]    Has the prescription been filled recently? Yes  Is the patient out of the medication? Yes  Has the patient been seen for an appointment in the last year OR does the patient have an upcoming appointment? Yes  Can we respond through MyChart? Yes  Agent: Please be advised that Rx refills may take up to 3 business days. We ask that you follow-up with your pharmacy.

## 2023-07-06 ENCOUNTER — Other Ambulatory Visit: Payer: Self-pay | Admitting: Family Medicine

## 2023-07-23 ENCOUNTER — Telehealth: Payer: Self-pay | Admitting: Family Medicine

## 2023-07-23 NOTE — Patient Instructions (Signed)

## 2023-07-23 NOTE — Progress Notes (Unsigned)
   Established Patient Office Visit   Subjective  Patient ID: [name], female    DOB: 05-20-1934  Age: 89 y.o. MRN: [id number]  No chief complaint on file.   She  has a past medical history of Ankle swelling, Anxiety, Arthritis, Asthma, Back pain, Complication of anesthesia, COVID (2023), Depression, GERD (gastroesophageal reflux disease), HOH (hard of hearing), Hypercholesterolemia, Hypothyroid, Parkinson disease (HCC), PONV (postoperative nausea and vomiting), and Seasonal allergies.  HPI  ROS    Objective:     There were no vitals taken for this visit. {Vitals History (Optional):23777}  Physical Exam   No results found for any visits on 07/24/23.  The ASCVD Risk score (Arnett DK, et al., 2019) failed to calculate for the following reasons:   The 2019 ASCVD risk score is only valid for ages 69 to 55    Assessment & Plan:  There are no diagnoses linked to this encounter.  No follow-ups on file.   Cruzita Lederer Newman Nip, FNP

## 2023-07-23 NOTE — Telephone Encounter (Signed)
Daughter in law debbie Niccoli called to let provider / nurse know patient hit ankle on car door and it is swollen.  It is oozing / no redness / swelling going down some. Is it okay for patient to wait til 02.13.2025 for her appointment.  [phone]    Teams message sent:  10:38 am 02.12.2025 Sandre Kitty -I have patient MRN [id number] [name]  daughter in law debbie Firman holding my phone it is okay for patient to wait til tomorrow to be seen. she hit her leg on car door yesterday. it is oozing / no redness / swelling is going down.  she is concern due to oozing.

## 2023-07-24 ENCOUNTER — Ambulatory Visit: Payer: Medicare Other | Admitting: Family Medicine

## 2023-07-24 ENCOUNTER — Encounter: Payer: Self-pay | Admitting: Family Medicine

## 2023-07-24 VITALS — BP 127/78 | HR 95 | Ht 61.0 in | Wt 163.1 lb

## 2023-07-24 DIAGNOSIS — L03115 Cellulitis of right lower limb: Secondary | ICD-10-CM | POA: Diagnosis not present

## 2023-07-24 DIAGNOSIS — L03116 Cellulitis of left lower limb: Secondary | ICD-10-CM | POA: Diagnosis not present

## 2023-07-24 MED ORDER — DOXYCYCLINE HYCLATE 100 MG PO TABS: 100.0000 mg | ORAL_TABLET | Freq: Two times a day (BID) | ORAL | 0 refills | Status: AC

## 2023-07-24 MED ORDER — FLUTICASONE-SALMETEROL 230-21 MCG/ACT IN AERO: 2.0000 | INHALATION_SPRAY | Freq: Two times a day (BID) | RESPIRATORY_TRACT | 12 refills | Status: DC

## 2023-07-24 NOTE — Assessment & Plan Note (Addendum)
Cellulites with chronic Stasis Dermatitis  Negative Homan's sign Doxycyline 100 mg twice daily x 10 days Discussed to keep skin clean and dry. Place a cool, damp cloth on the affected area, Apply compression wraps or stockings to help reduce the swelling Elevate the affected part of the body,  Follow up for erythema, purulent drainage, fever. . Patient verbalizes understanding regarding plan of care and all questions answered

## 2023-07-30 ENCOUNTER — Other Ambulatory Visit (HOSPITAL_COMMUNITY): Payer: Self-pay | Admitting: Family Medicine

## 2023-07-30 DIAGNOSIS — R600 Localized edema: Secondary | ICD-10-CM

## 2023-08-08 ENCOUNTER — Encounter (HOSPITAL_COMMUNITY): Payer: Self-pay

## 2023-08-08 ENCOUNTER — Ambulatory Visit (HOSPITAL_COMMUNITY): Payer: Medicare Other

## 2023-08-15 ENCOUNTER — Ambulatory Visit: Payer: Self-pay | Admitting: Family Medicine

## 2023-08-28 ENCOUNTER — Ambulatory Visit
Admission: RE | Admit: 2023-08-28 | Discharge: 2023-08-28 | Disposition: A | Discharge status: Home / Self Care | Source: Ambulatory Visit | Attending: Nurse Practitioner | Admitting: Nurse Practitioner

## 2023-08-28 VITALS — BP 154/72 | HR 100 | Temp 98.2°F | Resp 18

## 2023-08-28 DIAGNOSIS — J4531 Mild persistent asthma with (acute) exacerbation: Secondary | ICD-10-CM

## 2023-08-28 DIAGNOSIS — J069 Acute upper respiratory infection, unspecified: Secondary | ICD-10-CM | POA: Diagnosis not present

## 2023-08-28 LAB — POC COVID19/FLU A&B COMBO
Covid Antigen, POC: NEGATIVE
Influenza A Antigen, POC: NEGATIVE
Influenza B Antigen, POC: NEGATIVE

## 2023-08-28 MED ORDER — PREDNISONE 20 MG PO TABS: 40.0000 mg | ORAL_TABLET | Freq: Every day | ORAL | 0 refills | Status: AC

## 2023-08-28 MED ORDER — DOXYCYCLINE HYCLATE 100 MG PO TABS: 100.0000 mg | ORAL_TABLET | Freq: Two times a day (BID) | ORAL | 0 refills | Status: AC

## 2023-08-28 MED ORDER — METHYLPREDNISOLONE SODIUM SUCC 125 MG IJ SOLR
125.0000 mg | Freq: Once | INTRAMUSCULAR | Status: AC
  Administered 2023-08-28: 125 mg via INTRAMUSCULAR

## 2023-08-28 MED ORDER — IPRATROPIUM-ALBUTEROL 0.5-2.5 (3) MG/3ML IN SOLN
3.0000 mL | Freq: Once | RESPIRATORY_TRACT | Status: AC
  Administered 2023-08-28: 3 mL via RESPIRATORY_TRACT

## 2023-08-28 MED ORDER — BENZONATATE 100 MG PO CAPS: 100.0000 mg | ORAL_CAPSULE | Freq: Three times a day (TID) | ORAL | 0 refills | Status: DC | PRN

## 2023-08-28 NOTE — ED Triage Notes (Signed)
 Productive Cough and chest congestion since Sunday.  States symptoms have progressively gotten worse.  States she is having to use her nebulizer and inhaler more frequently.

## 2023-08-28 NOTE — ED Provider Notes (Signed)
 [facility]    CSN: [id number] Arrival date & time: 08/28/23  1650      History   Chief Complaint Chief Complaint  Patient presents with   Cough    Entered by patient    HPI [name] is a 89 y.o. female.   The history is provided by the patient.   Patient presents with a 3-day history of cough and chest congestion.  Patient denies fever, chills, headache, ear pain, difficulty breathing, chest pain, abdominal pain, nausea, vomiting, diarrhea, or rash.  Patient further endorses postnasal drainage, chest tightness, and wheezing.  She states she has been using her albuterol nebulizer and inhaler as she has an underlying history of asthma.  Also states that she started taking her allergy medication.  Denies obvious known sick contacts.  Past Medical History:  Diagnosis Date   Ankle swelling    takes torsemide prn   Anxiety    Arthritis    Asthma    allergy induced   Back pain    Complication of anesthesia    COVID 2023   Depression    GERD (gastroesophageal reflux disease)    HOH (hard of hearing)    Hypercholesterolemia    Hypothyroid    Parkinson disease (HCC)    PONV (postoperative nausea and vomiting)    Seasonal allergies     Patient Active Problem List   Diagnosis Date Noted   Bilateral lower leg cellulitis 07/24/2023   Inversion of both nipples 12/18/2022   PHN (postherpetic neuralgia) 12/18/2022   Cellulitis 12/11/2021   Cellulitis of right hand 12/10/2021   Hypocalcemia 12/10/2021   Normocytic anemia 12/10/2021   Depression 12/10/2021   Hypothyroidism 12/10/2021   Edema of left lower extremity 07/10/2021   GERD (gastroesophageal reflux disease)    History of Nissen fundoplication    IBS (irritable bowel syndrome) 02/05/2018   Diarrhea 06/16/2017   N&V (nausea and vomiting) 06/16/2017   Numbness 08/18/2015   Polyneuropathy 08/18/2015   Trigger ring finger of right hand 06/28/2015   Gastroesophageal reflux disease with esophagitis  06/15/2015   Bilateral carpal tunnel syndrome 06/15/2015   Resting tremor 06/15/2015   Primary localized osteoarthrosis, hand 05/15/2015   Carpal tunnel syndrome on left 05/15/2015   Carpal tunnel syndrome on right 05/15/2015   Scoliosis of thoracic spine 11/05/2013   Spinal stenosis, unspecified region other than cervical 12/03/2012   Spondylosis 12/03/2012   Parkinson disease (HCC)    Back pain    Seasonal allergies     Past Surgical History:  Procedure Laterality Date   ABDOMINAL HYSTERECTOMY     CARPAL TUNNEL RELEASE Right 10/26/2015   Procedure: CARPAL TUNNEL RELEASE;  Surgeon: Cindee Salt, MD;  Location: [facility];  Service: Orthopedics;  Laterality: Right;   CARPOMETACARPEL SUSPENSION PLASTY Right 10/26/2015   Procedure: RIGHT HAND TRAPEZIECTOMY WITH THUMB METACARPEL SUSPENSION PLASTY;  Surgeon: Cindee Salt, MD;  Location: [facility];  Service: Orthopedics;  Laterality: Right;   CATARACT EXTRACTION W/PHACO  12/23/2011   Procedure: CATARACT EXTRACTION PHACO AND INTRAOCULAR LENS PLACEMENT (IOC);  Surgeon: Susa Simmonds, MD;  Location: [facility];  Service: Ophthalmology;  Laterality: Right;  CDE=5.16   CHOLECYSTECTOMY     EYE SURGERY     cataract extraction of left eye-[facility]-2002   KNEE ARTHROSCOPY     right   NISSEN FUNDOPLICATION     [facility]   TRIGGER FINGER RELEASE Right 10/26/2015   Procedure: RELEASE A-1 PULLEY RIGHT RING FINGER;  Surgeon: Cindee Salt, MD;  Location: [facility];  Service: Orthopedics;  Laterality: Right;    OB History   No obstetric history on file.      Home Medications    Prior to Admission medications   Medication Sig Start Date End Date Taking? Authorizing Provider  acetaminophen (TYLENOL) 325 MG tablet Take 2 tablets (650 mg total) by mouth every 6 (six) hours as needed for moderate pain. 06/11/21   Wallis Bamberg, PA-C  albuterol (VENTOLIN HFA) 108 (90 Base) MCG/ACT inhaler Inhale 1-2 puffs into the lungs every  6 (six) hours as needed for wheezing or shortness of breath. 03/03/23   Gilmore Laroche, FNP  ALPRAZolam Prudy Feeler) 0.5 MG tablet Take 0.5 tablets (0.25 mg total) by mouth at bedtime as needed for sleep. 12/03/22   Dohmeier, Porfirio Mylar, MD  benzonatate (TESSALON PERLES) 100 MG capsule Take 1 capsule (100 mg total) by mouth 3 (three) times daily as needed for cough. 01/24/23   Leath-Warren, Sadie Haber, NP  carbidopa-levodopa (SINEMET IR) 25-100 MG tablet Take 1 tablet by mouth 4 (four) times daily. 04/09/23   Dohmeier, Porfirio Mylar, MD  cetirizine (ZYRTEC) 10 MG tablet Take 10 mg by mouth daily.    [provider]  escitalopram (LEXAPRO) 10 MG tablet Take 10 mg by mouth daily. Patient not taking: Reported on 04/09/2023 11/26/21   [provider]  famotidine (PEPCID) 20 MG tablet Take one dose before evening meal each day, may take second dose if needed. No more than two per day. 01/01/22   Letta Median, PA-C  fluticasone (FLONASE) 50 MCG/ACT nasal spray Place 2 sprays into both nostrils daily. 01/24/23   Leath-Warren, Sadie Haber, NP  fluticasone-salmeterol (ADVAIR HFA) 230-21 MCG/ACT inhaler Inhale 2 puffs into the lungs 2 (two) times daily. 07/24/23   Del Nigel Berthold, FNP  gabapentin (NEURONTIN) 100 MG capsule TAKE 3 CAPSULES BY MOUTH 3 TIMES DAILY 02/21/23   Gilmore Laroche, FNP  ketoconazole (NIZORAL) 2 % cream Apply 1 application topically daily. 06/11/21   Wallis Bamberg, PA-C  levothyroxine (SYNTHROID, LEVOTHROID) 25 MCG tablet Take 25 mcg by mouth daily. 03/09/14   [provider]  Lido-Capsaicin-Men-Methyl Sal (MEDI-PATCH-LIDOCAINE) 0.5-0.035-5-20 % PTCH Apply once a day to neuralgia area, right abdomen and below the rib cage. 12/03/22   Dohmeier, Porfirio Mylar, MD  lidocaine-prilocaine (EMLA) cream Apply 1 Application topically as needed. 12/03/22   Dohmeier, Porfirio Mylar, MD  Multiple Vitamins-Minerals (ZINC PO) Take by mouth daily.    [provider]  potassium chloride (MICRO-K) 10  MEQ CR capsule Take 20 mEq by mouth daily.  08/13/13   [provider]  prednisoLONE acetate (PRED FORTE) 1 % ophthalmic suspension 1 drop daily.    [provider]  torsemide (DEMADEX) 10 MG tablet Take 10 mg by mouth daily. 1/2 to 1 tab daily    [provider]  Vitamin D, Ergocalciferol, (DRISDOL) 1.25 MG (50000 UNIT) CAPS capsule Take 1 capsule (50,000 Units total) by mouth every 7 (seven) days. 12/24/22   Gilmore Laroche, FNP    Family History Family History  Problem Relation Age of Onset   Stroke Mother    COPD Father    Stroke Father    Colon cancer Neg Hx     Social History Social History   Tobacco Use   Smoking status: Never   Smokeless tobacco: Never  Vaping Use   Vaping status: Never Used  Substance Use Topics   Alcohol use: Yes    Alcohol/week: 1.0  standard drink of alcohol    Types: 1 Glasses of wine per week    Comment: occ   Drug use: No     Allergies   Cephalosporins, Latex, Lactose intolerance (gi), Lactulose, Cephalexin, Levofloxacin, Meperidine, Meperidine and related, Meperidine hcl, and Omeprazole   Review of Systems Review of Systems Per HPI  Physical Exam Triage Vital Signs ED Triage Vitals  Encounter Vitals Group     BP 08/28/23 1701 (!) 154/72     Systolic BP Percentile --      Diastolic BP Percentile --      Pulse Rate 08/28/23 1701 100     Resp 08/28/23 1701 18     Temp 08/28/23 1701 98.2 F (36.8 C)     Temp Source 08/28/23 1701 Oral     SpO2 08/28/23 1701 92 %     Weight --      Height --      Head Circumference --      Peak Flow --      Pain Score 08/28/23 1704 4     Pain Loc --      Pain Education --      Exclude from Growth Chart --    No data found.  Updated Vital Signs BP (!) 154/72 (BP Location: Right Arm)   Pulse 100   Temp 98.2 F (36.8 C) (Oral)   Resp 18   SpO2 92%   Visual Acuity Right Eye Distance:   Left Eye Distance:   Bilateral Distance:    Right Eye Near:   Left Eye Near:     Bilateral Near:     Physical Exam Vitals and nursing note reviewed.  Constitutional:      General: She is not in acute distress.    Appearance: Normal appearance.  HENT:     Head: Normocephalic.     Right Ear: Tympanic membrane, ear canal and external ear normal.     Left Ear: Tympanic membrane, ear canal and external ear normal.     Nose: Congestion present.     Mouth/Throat:     Mouth: Mucous membranes are moist.     Pharynx: No posterior oropharyngeal erythema.  Eyes:     Extraocular Movements: Extraocular movements intact.     Conjunctiva/sclera: Conjunctivae normal.     Pupils: Pupils are equal, round, and reactive to light.  Cardiovascular:     Rate and Rhythm: Normal rate and regular rhythm.     Pulses: Normal pulses.     Heart sounds: Normal heart sounds.  Pulmonary:     Effort: Pulmonary effort is normal.     Breath sounds: Examination of the right-upper field reveals wheezing. Examination of the right-lower field reveals wheezing. Examination of the left-lower field reveals wheezing. Wheezing (Expiratory wheezing noted) present.     Comments: Post DuoNeb, patient reports improvement of her breathing.  Increased movement of air noted throughout. Abdominal:     General: Bowel sounds are normal.     Palpations: Abdomen is soft.     Tenderness: There is no abdominal tenderness.  Musculoskeletal:     Cervical back: Normal range of motion.  Lymphadenopathy:     Cervical: No cervical adenopathy.  Skin:    General: Skin is warm and dry.  Neurological:     General: No focal deficit present.     Mental Status: She is alert and oriented to person, place, and time.  Psychiatric:        Mood and Affect: Mood normal.  Behavior: Behavior normal.      UC Treatments / Results  Labs (all labs ordered are listed, but only abnormal results are displayed) Labs Reviewed  POC COVID19/FLU A&B COMBO    EKG   Radiology No results found.  Procedures Procedures  (including critical care time)  Medications Ordered in UC Medications - No data to display  Initial Impression / Assessment and Plan / UC Course  I have reviewed the triage vital signs and the nursing notes.  Pertinent labs & imaging results that were available during my care of the patient were reviewed by me and considered in my medical decision making (see chart for details).  COVID/flu test was negative.  Suspect a possible viral infection that has exacerbated her asthma.  DuoNeb and Solu-Medrol 125 mg IM administered.  Will cover patient for asthma exacerbation with prednisone 40 mg for the next 5 days, and for the infection, will treat with doxycycline 100 mg twice daily for the next 5 days.  For the cough, Tessalon 100 mg was prescribed.  Supportive care recommendations were provided and discussed with the patient to include fluids, rest, over-the-counter Tylenol, normal saline nasal spray for nasal congestion, and use of a humidifier during sleep.  Discussed indications regarding follow-up and ER follow-up.  Patient was in agreement with this plan of care and verbalizes understanding.  All questions were answered.  Patient stable for discharge.  Final Clinical Impressions(s) / UC Diagnoses   Final diagnoses:  None   Discharge Instructions   None    ED Prescriptions   None    PDMP not reviewed this encounter.   Abran Cantor, NP 08/28/23 1733

## 2023-08-28 NOTE — Discharge Instructions (Addendum)
 The COVID/flu test was negative. You have been given an injection of Solu-Medrol 125 mg and a duo nebulizer today.  Start the prednisone on 08/29/2023. May take over-the-counter Tylenol as needed for pain, fever, or general discomfort. Recommend the use of normal saline nasal spray for nasal congestion and runny nose. For your cough, it may be helpful to use a humidifier in your bedroom at nighttime during sleep and to sleep elevated on pillows while cough symptoms persist. Continue your current allergy medication.  Also recommend beginning Flonase daily. Go to the emergency department immediately if you experience shortness of breath, difficulty breathing, or become unable to speak in a complete sentence. Follow-up with your primary care physician within the next 7 to 10 days for reevaluation. Follow-up as needed.

## 2023-09-11 ENCOUNTER — Ambulatory Visit: Payer: Medicare Other | Admitting: Family Medicine

## 2023-09-11 ENCOUNTER — Telehealth: Payer: Self-pay | Admitting: Family Medicine

## 2023-09-11 ENCOUNTER — Encounter: Payer: Self-pay | Admitting: Family Medicine

## 2023-09-11 VITALS — BP 148/82 | HR 104 | Resp 16 | Ht 60.0 in | Wt 164.0 lb

## 2023-09-11 DIAGNOSIS — J4521 Mild intermittent asthma with (acute) exacerbation: Secondary | ICD-10-CM | POA: Diagnosis not present

## 2023-09-11 MED ORDER — PREDNISONE 20 MG PO TABS: 40.0000 mg | ORAL_TABLET | Freq: Every day | ORAL | 0 refills | Status: AC

## 2023-09-11 MED ORDER — LEVOCETIRIZINE DIHYDROCHLORIDE 5 MG PO TABS: 5.0000 mg | ORAL_TABLET | Freq: Every evening | ORAL | 1 refills | Status: AC

## 2023-09-11 MED ORDER — GUAIFENESIN 100 MG/5ML PO LIQD: 5.0000 mL | ORAL | 0 refills | Status: DC | PRN

## 2023-09-11 NOTE — Progress Notes (Signed)
 Established Patient Office Visit  Subjective:  Patient ID: [name], female    DOB: Nov 17, 1933  Age: 89 y.o. MRN: [id number]  CC:  Chief Complaint  Patient presents with   Asthma    Asthma flared up a few weeks on 3/20 ago and she had to go to the urgent care, finished all the meds but still has a cough w/ thick mucus and wheezing if she lays down. Has been using her neb 2x daily and her inhalers.    HPI [name] is a 89 y.o. female presents for ED  f/u.   Mild Persistent Asthma with Exacerbation: The patient was treated on 08/28/2023 for mild persistent asthma with an acute exacerbation and completed a 5-day course of doxycycline and prednisone. She reports completing both prescriptions but continues to experience persistent symptoms, including increased coughing with thick mucus, shortness of breath, and wheezing, particularly when lying down. She has been using her nebulizer treatments twice daily in addition to her inhaler.    Past Medical History:  Diagnosis Date   Ankle swelling    takes torsemide prn   Anxiety    Arthritis    Asthma    allergy induced   Back pain    Complication of anesthesia    COVID 2023   Depression    GERD (gastroesophageal reflux disease)    HOH (hard of hearing)    Hypercholesterolemia    Hypothyroid    Parkinson disease (HCC)    PONV (postoperative nausea and vomiting)    Seasonal allergies     Past Surgical History:  Procedure Laterality Date   ABDOMINAL HYSTERECTOMY     CARPAL TUNNEL RELEASE Right 10/26/2015   Procedure: CARPAL TUNNEL RELEASE;  Surgeon: Cindee Salt, MD;  Location: [facility];  Service: Orthopedics;  Laterality: Right;   CARPOMETACARPEL SUSPENSION PLASTY Right 10/26/2015   Procedure: RIGHT HAND TRAPEZIECTOMY WITH THUMB METACARPEL SUSPENSION PLASTY;  Surgeon: Cindee Salt, MD;  Location: [facility];  Service: Orthopedics;  Laterality: Right;   CATARACT EXTRACTION W/PHACO  12/23/2011    Procedure: CATARACT EXTRACTION PHACO AND INTRAOCULAR LENS PLACEMENT (IOC);  Surgeon: Susa Simmonds, MD;  Location: [facility];  Service: Ophthalmology;  Laterality: Right;  CDE=5.16   CHOLECYSTECTOMY     EYE SURGERY     cataract extraction of left eye-[facility]-2002   KNEE ARTHROSCOPY     right   NISSEN FUNDOPLICATION     [facility]   TRIGGER FINGER RELEASE Right 10/26/2015   Procedure: RELEASE A-1 PULLEY RIGHT RING FINGER;  Surgeon: Cindee Salt, MD;  Location: [facility];  Service: Orthopedics;  Laterality: Right;    Family History  Problem Relation Age of Onset   Stroke Mother    COPD Father    Stroke Father    Colon cancer Neg Hx     Social History   Socioeconomic History   Marital status: Widowed    Spouse name: Not on file   Number of children: 2   Years of education: Not on file   Highest education level: 12th grade  Occupational History    Comment: retired  Tobacco Use   Smoking status: Never   Smokeless tobacco: Never  Vaping Use   Vaping status: Never Used  Substance and Sexual Activity   Alcohol use: Yes    Alcohol/week: 1.0 standard drink of alcohol    Types: 1 Glasses of wine per week    Comment: occ   Drug use: No  Sexual activity: Not Currently    Birth control/protection: Surgical  Other Topics Concern   Not on file  Social History Narrative   ** Merged History Encounter ** Patient lives at home alone and she is widowed.   Retired.    Education one year business course.   Right handed.   Caffeine two cup of coffee daily and 4 cups of sweet tea   Social Drivers of Corporate investment banker Strain: Low Risk  (09/07/2023)   Overall Financial Resource Strain (CARDIA)    Difficulty of Paying Living Expenses: Not hard at all  Food Insecurity: No Food Insecurity (09/07/2023)   Hunger Vital Sign    Worried About Running Out of Food in the Last Year: Never true    Ran Out of Food in the Last Year: Never true  Transportation Needs: No Transportation  Needs (09/07/2023)   PRAPARE - Administrator, Civil Service (Medical): No    Lack of Transportation (Non-Medical): No  Physical Activity: Insufficiently Active (09/07/2023)   Exercise Vital Sign    Days of Exercise per Week: 2 days    Minutes of Exercise per Session: 10 min  Stress: No Stress Concern Present (09/07/2023)   [employer] of Occupational Health - Occupational Stress Questionnaire    Feeling of Stress : Not at all  Social Connections: Moderately Integrated (09/07/2023)   Social Connection and Isolation Panel [NHANES]    Frequency of Communication with Friends and Family: More than three times a week    Frequency of Social Gatherings with Friends and Family: Twice a week    Attends Religious Services: More than 4 times per year    Active Member of [employer] or Organizations: Yes    Attends Banker Meetings: More than 4 times per year    Marital Status: Widowed  Intimate Partner Violence: Not At Risk (03/04/2023)   Humiliation, Afraid, Rape, and Kick questionnaire    Fear of Current or Ex-Partner: No    Emotionally Abused: No    Physically Abused: No    Sexually Abused: No    Outpatient Medications Prior to Visit  Medication Sig Dispense Refill   acetaminophen (TYLENOL) 325 MG tablet Take 2 tablets (650 mg total) by mouth every 6 (six) hours as needed for moderate pain. 30 tablet 0   albuterol (VENTOLIN HFA) 108 (90 Base) MCG/ACT inhaler Inhale 1-2 puffs into the lungs every 6 (six) hours as needed for wheezing or shortness of breath. 18 g 0   ALPRAZolam (XANAX) 0.5 MG tablet Take 0.5 tablets (0.25 mg total) by mouth at bedtime as needed for sleep. 30 tablet 0   benzonatate (TESSALON) 100 MG capsule Take 1 capsule (100 mg total) by mouth 3 (three) times daily as needed for cough. 30 capsule 0   carbidopa-levodopa (SINEMET IR) 25-100 MG tablet Take 1 tablet by mouth 4 (four) times daily. 360 tablet 2   escitalopram (LEXAPRO) 10 MG tablet Take 10 mg by  mouth daily.     famotidine (PEPCID) 20 MG tablet Take one dose before evening meal each day, may take second dose if needed. No more than two per day. 60 tablet 5   fluticasone (FLONASE) 50 MCG/ACT nasal spray Place 2 sprays into both nostrils daily. 16 g 0   fluticasone-salmeterol (ADVAIR HFA) 230-21 MCG/ACT inhaler Inhale 2 puffs into the lungs 2 (two) times daily. 1 each 12   gabapentin (NEURONTIN) 100 MG capsule TAKE 3 CAPSULES BY MOUTH 3 TIMES DAILY  480 capsule 5   ketoconazole (NIZORAL) 2 % cream Apply 1 application topically daily. 30 g 0   levothyroxine (SYNTHROID, LEVOTHROID) 25 MCG tablet Take 25 mcg by mouth daily.     Lido-Capsaicin-Men-Methyl Sal (MEDI-PATCH-LIDOCAINE) 0.5-0.035-5-20 % PTCH Apply once a day to neuralgia area, right abdomen and below the rib cage. 30 patch 1   lidocaine-prilocaine (EMLA) cream Apply 1 Application topically as needed. 30 g 5   Multiple Vitamins-Minerals (ZINC PO) Take by mouth daily.     potassium chloride (MICRO-K) 10 MEQ CR capsule Take 20 mEq by mouth daily.      prednisoLONE acetate (PRED FORTE) 1 % ophthalmic suspension 1 drop daily.     torsemide (DEMADEX) 10 MG tablet Take 10 mg by mouth daily. 1/2 to 1 tab daily     Vitamin D, Ergocalciferol, (DRISDOL) 1.25 MG (50000 UNIT) CAPS capsule Take 1 capsule (50,000 Units total) by mouth every 7 (seven) days. 20 capsule 1   cetirizine (ZYRTEC) 10 MG tablet Take 10 mg by mouth daily.     No facility-administered medications prior to visit.    Allergies  Allergen Reactions   Cephalosporins Rash   Latex Rash    Breaks out then gets infected   Lactose Intolerance (Gi) Nausea And Vomiting   Lactulose Nausea And Vomiting   Cephalexin Rash   Levofloxacin Anxiety and Rash   Meperidine Nausea And Vomiting    Needed Phenergan to combat nausea.   Meperidine And Related Nausea And Vomiting    Needed Phenergan to combat nausea.   Meperidine Hcl Nausea And Vomiting    Needed Phenergan to combat nausea.    Omeprazole Rash    ROS Review of Systems  Constitutional:  Negative for chills and fever.  Eyes:  Negative for visual disturbance.  Respiratory:  Positive for cough, chest tightness, shortness of breath and wheezing.   Neurological:  Negative for dizziness and headaches.      Objective:    Physical Exam HENT:     Head: Normocephalic.     Mouth/Throat:     Mouth: Mucous membranes are moist.  Cardiovascular:     Rate and Rhythm: Normal rate.     Heart sounds: Normal heart sounds.  Pulmonary:     Effort: Pulmonary effort is normal.     Breath sounds: Wheezing present.  Neurological:     Mental Status: She is alert.     BP (!) 148/82   Pulse (!) 104   Resp 16   Ht 5' (1.524 m)   Wt 164 lb (74.4 kg)   SpO2 93%   BMI 32.03 kg/m  Wt Readings from Last 3 Encounters:  09/11/23 164 lb (74.4 kg)  07/24/23 163 lb 1.9 oz (74 kg)  04/09/23 156 lb (70.8 kg)    Lab Results  Component Value Date   TSH 3.630 12/23/2022   Lab Results  Component Value Date   WBC 5.0 12/23/2022   HGB 12.5 12/23/2022   HCT 38.0 12/23/2022   MCV 93 12/23/2022   PLT 247 12/23/2022   Lab Results  Component Value Date   NA 143 12/23/2022   K 4.8 12/23/2022   CO2 21 12/23/2022   GLUCOSE 81 12/23/2022   BUN 11 12/23/2022   CREATININE 0.82 12/23/2022   BILITOT 0.5 12/23/2022   ALKPHOS 82 12/23/2022   AST 14 12/23/2022   ALT 9 12/23/2022   PROT 6.2 12/23/2022   ALBUMIN 4.2 12/23/2022   CALCIUM 9.1 12/23/2022   ANIONGAP 7  12/11/2021   EGFR 69 12/23/2022   Lab Results  Component Value Date   CHOL 180 12/23/2022   Lab Results  Component Value Date   HDL 54 12/23/2022   Lab Results  Component Value Date   LDLCALC 110 (H) 12/23/2022   Lab Results  Component Value Date   TRIG 85 12/23/2022   Lab Results  Component Value Date   CHOLHDL 3.3 12/23/2022   Lab Results  Component Value Date   HGBA1C 6.2 (H) 12/23/2022      Assessment & Plan:  Mild intermittent asthma with  exacerbation Assessment & Plan: Encouraged to start taking Prednisone 40 mg daily for 5 days to reduce airway inflammation. Claritin has been discontinued and Xyzal 5 mg daily has been prescribed instead for ongoing allergy symptoms. Robitussin 5 mL by mouth every 4 hours as needed for cough has also been prescribed. The patient is advised to follow up if symptoms worsen or fail to improve, including: Increased shortness of breath Wheezing Chest tightness Persistent cough Difficulty performing normal activities  Orders: -     predniSONE; Take 2 tablets (40 mg total) by mouth daily for 5 days.  Dispense: 10 tablet; Refill: 0 -     Levocetirizine Dihydrochloride; Take 1 tablet (5 mg total) by mouth every evening.  Dispense: 60 tablet; Refill: 1 -     guaiFENesin; Take 5 mLs by mouth every 4 (four) hours as needed for cough or to loosen phlegm.  Dispense: 120 mL; Refill: 0   Note: This chart has been completed using Engineer, civil (consulting) software, and while attempts have been made to ensure accuracy, certain words and phrases may not be transcribed as intended.   Follow-up: No follow-ups on file.   Gilmore Laroche, FNP

## 2023-09-11 NOTE — Telephone Encounter (Signed)
 Message left asking her to return my call. Pt asked for a refill of Torsemide and Malachi Bonds needs to know why she is on it. (Looks like it has been being prescribed by Jayme Cloud, NP at neurology.

## 2023-09-11 NOTE — Assessment & Plan Note (Signed)
 Encouraged to start taking Prednisone 40 mg daily for 5 days to reduce airway inflammation. Claritin has been discontinued and Xyzal 5 mg daily has been prescribed instead for ongoing allergy symptoms. Robitussin 5 mL by mouth every 4 hours as needed for cough has also been prescribed. The patient is advised to follow up if symptoms worsen or fail to improve, including: Increased shortness of breath Wheezing Chest tightness Persistent cough Difficulty performing normal activities

## 2023-09-11 NOTE — Telephone Encounter (Signed)
 Pt states she needs a refill of the torsemide sent to [facility]. It looks like it hasn't been refilled by you before but she says she has been taking it one per day for a long time. Pls advise

## 2023-09-11 NOTE — Patient Instructions (Addendum)
 I appreciate the opportunity to provide care to you today!    Mild Asthma with Exacerbation: The patient is to start Prednisone 40 mg daily for 5 days to reduce airway inflammation. Claritin has been discontinued and Xyzal 5 mg daily has been prescribed instead for ongoing allergy symptoms. Robitussin 5 mL by mouth every 4 hours as needed for cough has also been prescribed.  The patient is advised to follow up if symptoms worsen or fail to improve, including: Increased shortness of breath Wheezing Chest tightness Persistent cough Difficulty performing normal activities    Please continue to a heart-healthy diet and increase your physical activities. Try to exercise for at least five days a week.    It was a pleasure to see you and I look forward to continuing to work together on your health and well-being. Please do not hesitate to call the office if you need care or have questions about your care.  In case of emergency, please visit the Emergency Department for urgent care, or contact our clinic at [phone] to schedule an appointment. We're here to help you!   Have a wonderful day and week. With Gratitude, Gilmore Laroche MSN, FNP-BC

## 2023-09-23 ENCOUNTER — Telehealth: Payer: Self-pay

## 2023-09-23 NOTE — Telephone Encounter (Signed)
 Copied from CRM [id number]. Topic: Clinical - Medication Question >> Sep 23, 2023  3:37 PM Georgeann Kindred wrote: Reason for CRM: Patient calling to get a refill on her torsemide medication. Patient is also returning call to [facility]. Patient states that she is taking the medication due to retaining fluid in her legs and she has cellulitis in her left leg. Please contact patient back at [phone].

## 2023-09-25 ENCOUNTER — Telehealth: Payer: Self-pay | Admitting: Family Medicine

## 2023-09-25 NOTE — Telephone Encounter (Signed)
 Prescription Request  09/25/2023  LOV: 09/11/2023  What is the name of the medication or equipment? torsemide (DEMADEX) 10 MG tablet [[id number]]   Have you contacted your pharmacy to request a refill? Yes   Which pharmacy would you like this sent to?   [facility]   Patient notified that their request is being sent to the clinical staff for review and that they should receive a response within 2 business days.   Please advise at  email sent

## 2023-09-26 NOTE — Telephone Encounter (Signed)
 See other message

## 2023-09-26 NOTE — Telephone Encounter (Signed)
 Please advise. In the past message Cody Das said it looks like it has was being prescribed by Armond Bertin, NP at neurology.

## 2023-10-06 ENCOUNTER — Other Ambulatory Visit: Payer: Self-pay | Admitting: Family Medicine

## 2023-10-06 DIAGNOSIS — B0229 Other postherpetic nervous system involvement: Secondary | ICD-10-CM

## 2023-10-07 ENCOUNTER — Encounter: Payer: Self-pay | Admitting: Neurology

## 2023-10-07 ENCOUNTER — Ambulatory Visit: Payer: Medicare Other | Admitting: Neurology

## 2023-10-07 VITALS — BP 130/72 | HR 80 | Ht 60.0 in | Wt 164.6 lb

## 2023-10-07 DIAGNOSIS — G20A2 Parkinson's disease without dyskinesia, with fluctuations: Secondary | ICD-10-CM | POA: Diagnosis not present

## 2023-10-07 DIAGNOSIS — R293 Abnormal posture: Secondary | ICD-10-CM | POA: Insufficient documentation

## 2023-10-07 DIAGNOSIS — M4124 Other idiopathic scoliosis, thoracic region: Secondary | ICD-10-CM

## 2023-10-07 DIAGNOSIS — B0229 Other postherpetic nervous system involvement: Secondary | ICD-10-CM

## 2023-10-07 MED ORDER — LIDOCAINE-PRILOCAINE 2.5-2.5 % EX CREA: 1.0000 | TOPICAL_CREAM | CUTANEOUS | 5 refills | Status: AC | PRN

## 2023-10-07 MED ORDER — BALMEX ADULT CARE 11.3 % EX CREA: 1.0000 | TOPICAL_CREAM | Freq: Two times a day (BID) | CUTANEOUS | 1 refills | Status: AC

## 2023-10-07 NOTE — Telephone Encounter (Signed)
 Kindly encourage to follow up with neurology

## 2023-10-07 NOTE — Progress Notes (Signed)
 Provider:  Neomia Banner, MD  Primary Care Physician:  Torres, Gloria, FNP [address] [place] [address]     Referring Provider: Zarwolo, Gloria, Fnp [address] [place],  [place] [address]          Chief Complaint according to patient   Patient presents with:                HISTORY OF PRESENT ILLNESS:  [name] is a 89 y.o. female patient who is here for revisit 10/07/2023 for  parkinsonism, mild form, little tremor, no cognitive impairment, still walking with a cane. [name] is a 89 y.o. female patient who is followed here for PD ; Parkinson's disease without dyskinesia, with fluctuating manifestations , hypothyroidism, GERD. She has been using a cane and is notably more stooped when walking;  gait disorder, postherpetic flank pain.   04/09/2023 : Chief concern according to patient :  I have been doing better in terms of herpetic pain,  but more chin and jaw tremor, titubation.  Doing well overall on Sinemet   25/ 100 mg qid.  BTW: her ankle edema has much improved.        12-03-2022:  Chief concern according to patient :  Pt here with daughter, rm 2. She is here today for parkinsons follow up. Had shingles since April 24, and is still in pain- affecting her sleep- started on the upper abdomen , involving the naval,  halfway around the right body.   She continues the carbidopa  levo 25/100 mg 4 times a day. Overall has had a lot of other health issues that are happening. She is concerned  about gabapentin  clouding her mind and memory. She has been miserable. We discussed EMLA  cream and Lidocaine  patches to help topically.  She is notably more stooped.      Recent ED visit. "The patient presents for complaints of cough and urinary tract infection symptoms.  Patient states urinary tract infection symptoms started approximately 4 to 5 days ago.  She complains of chills, fatigue, lower abdominal pain, pain with urination, urgency,  frequency, and low back pain.  Patient denies fever, chest pain, nausea, vomiting, diarrhea, hematuria, decreased urine stream, and flank pain.  Patient reports her last UTI was approximately 3 years ago".   With regard to her cough nasal congestion, and runny nose, symptoms started over the past 24 hours.  Patient reports a history of seasonal allergies.  She denies fever, ear pain, sore throat, wheezing, shortness of breath, difficulty breathing, chest pain, nausea, vomiting, or diarrhea.  Patient reports that she takes Zyrtec daily.  She reports that she has been using her albuterol  inhaler, and does have an albuterol  nebulizer machine.  She reports that symptoms worsened after she went outside yesterday.  She denies any obvious known sick contacts.    03/11/22 : She had a fall going up a step right after Christmas. She didn't have any broken bones but it took her awhile to get over it. She fell since then as well. She believes she is unsteady and balance is worse but doesn't feel any other symptoms have worsened. She lives alone and does everything for herselfMs. Torres is an 89 year old year female with a parkinson disease. She returns today for follow-up.  She has not been seen since 2021. Has had several falls, fortunately has not had significant injuries. Uses a cane  or walking stick when ambulating.  Does get chocked with food and liquids. Very sporadic.  Does not occur every time she eats. Tremor is mild in the upper extremities. Doesn't sleep well. Has trouble falling asleep but once a asleep will sleep all night.  Continues on Sinemet  4 times a day   09/06/19: [name] is an 89 year old female with a history of Parkinson's disease.  She returns today for follow-up.  She states that she was in physical therapy up until December.  She states that she was doing well. She states that she stopped therapy around Christmas due to increase in COVID-19 cases in [place].  She would like to  restart therapy now.  She reports that she did have a recent fall on her back deck.  Reports that her tremor is stable.  Reports on occasion she may get choked when trying to swallow food particularly cornbread.  But this is not consistent.  She returns today for an evaluation.     HISTORY [name] is an 89 year old female with Parkinson's disease.  She returns today for follow-up.  She states that she noticed over the summer that the heat affects her Parkinson's.  Therefore she tries to stay indoors.  She states that her biggest issue is staying asleep.  She states that some nights she does not go to bed till after midnight.  And even then she may wake up several times.  She states that she tried taking Xanax  before bed but it makes her sleep a lot.  She states in the past she is also tried melatonin but took it right before bedtime.  She continues on Sinemet  but reports that she typically takes it 3 times a day instead of 4.  Reports that there are times that her balance has been off.  She has reported some falls.  She states that she typically falls forward.  Fortunately she is not suffered any significant injuries.  She states that she has been trying to use her cane more.  Denies any trouble eating.  Reports that she does eat slower than before.  She returns today for evaluation.      Review of Systems: Out of a complete 14 system review, the patient complains of only the following symptoms, and all other reviewed systems are negative.:   PD, gait disorder, uses a cane. Weeping leg edema.    Fatigue, sleepiness ,    Allergic asthma, using albuterol .    Ongoing  pain from  Postherpetic neuralgia , right posterior , flank and lower thorac nerves.  No memory loss concerns.     Social History   Socioeconomic History   Marital status: Widowed    Spouse name: Not on file   Number of children: 2   Years of education: Not on file   Highest education level: 12th grade  Occupational  History    Comment: retired  Tobacco Use   Smoking status: Never   Smokeless tobacco: Never  Vaping Use   Vaping status: Never Used  Substance and Sexual Activity   Alcohol use: Yes    Alcohol/week: 1.0 standard drink of alcohol    Types: 1 Glasses of wine per week    Comment: occ   Drug use: No   Sexual activity: Not Currently    Birth control/protection: Surgical  Other Topics Concern   Not on file  Social History Narrative   ** Merged History Encounter ** Patient lives at home alone and she  is widowed.   Retired.    Education one year business course.   Right handed.   Caffeine two cup of coffee daily and 4 cups of sweet tea   Social Drivers of Corporate investment banker Strain: Low Risk  (09/07/2023)   Overall Financial Resource Strain (CARDIA)    Difficulty of Paying Living Expenses: Not hard at all  Food Insecurity: No Food Insecurity (09/07/2023)   Hunger Vital Sign    Worried About Running Out of Food in the Last Year: Never true    Ran Out of Food in the Last Year: Never true  Transportation Needs: No Transportation Needs (09/07/2023)   PRAPARE - Administrator, Civil Service (Medical): No    Lack of Transportation (Non-Medical): No  Physical Activity: Insufficiently Active (09/07/2023)   Exercise Vital Sign    Days of Exercise per Week: 2 days    Minutes of Exercise per Session: 10 min  Stress: No Stress Concern Present (09/07/2023)   [employer] of Occupational Health - Occupational Stress Questionnaire    Feeling of Stress : Not at all  Social Connections: Moderately Integrated (09/07/2023)   Social Connection and Isolation Panel [NHANES]    Frequency of Communication with Friends and Family: More than three times a week    Frequency of Social Gatherings with Friends and Family: Twice a week    Attends Religious Services: More than 4 times per year    Active Member of [employer] or Organizations: Yes    Attends Banker Meetings: More  than 4 times per year    Marital Status: Widowed    Family History  Problem Relation Age of Onset   Stroke Mother    COPD Father    Stroke Father    Colon cancer Neg Hx     Past Medical History:  Diagnosis Date   Ankle swelling    takes torsemide  prn   Anxiety    Arthritis    Asthma    allergy induced   Back pain    Complication of anesthesia    COVID 2023   Depression    GERD (gastroesophageal reflux disease)    HOH (hard of hearing)    Hypercholesterolemia    Hypothyroid    Parkinson disease (HCC)    PONV (postoperative nausea and vomiting)    Seasonal allergies     Past Surgical History:  Procedure Laterality Date   ABDOMINAL HYSTERECTOMY     CARPAL TUNNEL RELEASE Right 10/26/2015   Procedure: CARPAL TUNNEL RELEASE;  Surgeon: Lyanne Sample, MD;  Location: [facility];  Service: Orthopedics;  Laterality: Right;   CARPOMETACARPEL SUSPENSION PLASTY Right 10/26/2015   Procedure: RIGHT HAND TRAPEZIECTOMY WITH THUMB METACARPEL SUSPENSION PLASTY;  Surgeon: Lyanne Sample, MD;  Location: [facility];  Service: Orthopedics;  Laterality: Right;   CATARACT EXTRACTION W/PHACO  12/23/2011   Procedure: CATARACT EXTRACTION PHACO AND INTRAOCULAR LENS PLACEMENT (IOC);  Surgeon: Clay Cummins, MD;  Location: [facility];  Service: Ophthalmology;  Laterality: Right;  CDE=5.16   CHOLECYSTECTOMY     EYE SURGERY     cataract extraction of left eye-[facility]-2002   KNEE ARTHROSCOPY     right   NISSEN FUNDOPLICATION     [facility]   TRIGGER FINGER RELEASE Right 10/26/2015   Procedure: RELEASE A-1 PULLEY RIGHT RING FINGER;  Surgeon: Lyanne Sample, MD;  Location: [facility];  Service: Orthopedics;  Laterality: Right;     Current Outpatient Medications  on File Prior to Visit  Medication Sig Dispense Refill   acetaminophen  (TYLENOL ) 325 MG tablet Take 2 tablets (650 mg total) by mouth every 6 (six) hours as needed for moderate pain. 30 tablet 0   albuterol  (VENTOLIN  HFA)  108 (90 Base) MCG/ACT inhaler Inhale 1-2 puffs into the lungs every 6 (six) hours as needed for wheezing or shortness of breath. 18 g 0   benzonatate  (TESSALON ) 100 MG capsule Take 1 capsule (100 mg total) by mouth 3 (three) times daily as needed for cough. 30 capsule 0   carbidopa -levodopa  (SINEMET  IR) 25-100 MG tablet Take 1 tablet by mouth 4 (four) times daily. 360 tablet 2   escitalopram (LEXAPRO) 10 MG tablet Take 10 mg by mouth daily.     famotidine  (PEPCID ) 20 MG tablet Take one dose before evening meal each day, may take second dose if needed. No more than two per day. 60 tablet 5   fluticasone  (FLONASE ) 50 MCG/ACT nasal spray Place 2 sprays into both nostrils daily. 16 g 0   fluticasone -salmeterol (ADVAIR HFA) 230-21 MCG/ACT inhaler Inhale 2 puffs into the lungs 2 (two) times daily. 1 each 12   gabapentin  (NEURONTIN ) 100 MG capsule TAKE 3 CAPSULES BY MOUTH 3 TIMES DAILY 810 capsule 3   guaiFENesin  (ROBITUSSIN) 100 MG/5ML liquid Take 5 mLs by mouth every 4 (four) hours as needed for cough or to loosen phlegm. 120 mL 0   ketoconazole  (NIZORAL ) 2 % cream Apply 1 application topically daily. 30 g 0   levocetirizine (XYZAL ) 5 MG tablet Take 1 tablet (5 mg total) by mouth every evening. 60 tablet 1   levothyroxine  (SYNTHROID , LEVOTHROID) 25 MCG tablet Take 25 mcg by mouth daily.     Lido-Capsaicin-Men-Methyl Sal (MEDI-PATCH-LIDOCAINE ) 0.5-0.035-5-20 % PTCH Apply once a day to neuralgia area, right abdomen and below the rib cage. 30 patch 1   lidocaine -prilocaine  (EMLA ) cream Apply 1 Application topically as needed. 30 g 5   Multiple Vitamins-Minerals (ZINC PO) Take by mouth daily.     potassium chloride (MICRO-K) 10 MEQ CR capsule Take 20 mEq by mouth daily.      torsemide  (DEMADEX ) 10 MG tablet Take 10 mg by mouth daily. 1/2 to 1 tab daily     Vitamin D , Ergocalciferol , (DRISDOL ) 1.25 MG (50000 UNIT) CAPS capsule Take 1 capsule (50,000 Units total) by mouth every 7 (seven) days. 20 capsule 1    ALPRAZolam  (XANAX ) 0.5 MG tablet Take 0.5 tablets (0.25 mg total) by mouth at bedtime as needed for sleep. (Patient not taking: Reported on 10/07/2023) 30 tablet 0   prednisoLONE acetate (PRED FORTE) 1 % ophthalmic suspension 1 drop daily.     No current facility-administered medications on file prior to visit.    Allergies  Allergen Reactions   Cephalosporins Rash   Latex Rash    Breaks out then gets infected   Lactose Intolerance (Gi) Nausea And Vomiting   Lactulose Nausea And Vomiting   Cephalexin Rash   Levofloxacin Anxiety and Rash   Meperidine  Nausea And Vomiting    Needed Phenergan  to combat nausea.   Meperidine  And Related Nausea And Vomiting    Needed Phenergan  to combat nausea.   Meperidine  Hcl Nausea And Vomiting    Needed Phenergan  to combat nausea.   Omeprazole Rash     DIAGNOSTIC DATA (LABS, IMAGING, TESTING) - I reviewed patient records, labs, notes, testing and imaging myself where available.  Lab Results  Component Value Date   WBC 5.0 12/23/2022  HGB 12.5 12/23/2022   HCT 38.0 12/23/2022   MCV 93 12/23/2022   PLT 247 12/23/2022      Component Value Date/Time   NA 143 12/23/2022 1033   K 4.8 12/23/2022 1033   CL 108 (H) 12/23/2022 1033   CO2 21 12/23/2022 1033   GLUCOSE 81 12/23/2022 1033   GLUCOSE 103 (H) 12/11/2021 0443   BUN 11 12/23/2022 1033   CREATININE 0.82 12/23/2022 1033   CALCIUM  9.1 12/23/2022 1033   PROT 6.2 12/23/2022 1033   ALBUMIN 4.2 12/23/2022 1033   AST 14 12/23/2022 1033   ALT 9 12/23/2022 1033   ALKPHOS 82 12/23/2022 1033   BILITOT 0.5 12/23/2022 1033   GFRNONAA >60 12/11/2021 0443   GFRAA >60 10/24/2015 1038   Lab Results  Component Value Date   CHOL 180 12/23/2022   HDL 54 12/23/2022   LDLCALC 110 (H) 12/23/2022   TRIG 85 12/23/2022   CHOLHDL 3.3 12/23/2022   Lab Results  Component Value Date   HGBA1C 6.2 (H) 12/23/2022   No results found for: "VITAMINB12" Lab Results  Component Value Date   TSH 3.630  12/23/2022    PHYSICAL EXAM:  Today's Vitals   10/07/23 1454  BP: 130/72  Pulse: 80  Weight: 164 lb 9.6 oz (74.7 kg)  Height: 5' (1.524 m)   Body mass index is 32.15 kg/m.   Wt Readings from Last 3 Encounters:  10/07/23 164 lb 9.6 oz (74.7 kg)  09/11/23 164 lb (74.4 kg)  07/24/23 163 lb 1.9 oz (74 kg)     Ht Readings from Last 3 Encounters:  10/07/23 5' (1.524 m)  09/11/23 5' (1.524 m)  07/24/23 5\' 1"  (1.549 m)      General: The patient is well groomed. Head: Normocephalic, atraumatic.  The patient is awake, alert and appears not in acute distress.   Dental status:  biologic Cardiovascular:  Regular rate and cardiac rhythm by pulse,  without distended neck veins. Respiratory:coughing  Skin:  With evidence of ankle edema, weeping . Hot to touch.  Very stretched skin, red and shiny. Tight , unable to compress, small area of weeping.  Improving when legs are elevated .  Trunk: The patient's posture is stooped .    NEUROLOGIC EXAM: The patient is awake and alert, oriented to place and time.   Memory subjective described as intact.  Attention span & concentration ability appears normal.  Speech is fluent,  without dysarthria, with dysphonia , no aphasia.  Mood and affect are appropriate.   Cranial nerves: no loss of smell or taste reported  Pupils are equal and briskly reactive to light. Funduscopic exam deferred.  Extraocular movements in vertical and horizontal planes were intact and without nystagmus. No Diplopia. Visual fields by finger perimetry are intact. Hearing was intact to soft voice and finger rubbing.    Facial sensation intact to fine touch.  Facial motor strength is symmetric and tongue and uvula move midline.  Neck ROM : head drop- stooped - this is not a cervical  dystonia but a stopped posture with anticolli.  rotation, tilt and flexion extension were severely limited.  shoulder shrug was symmetrical.    Motor exam:  Symmetric bulk, tone and ROM.    Normal tone with cog wheeling, but with symmetric loss of grip strength .Mild resting tremor in the upper extremities. Right greater than left ,   Right hip flexion strength is greater than left ,  knee extension is intact.    Sensory:  Fine touch, pinprick and vibration were intact  carpal tunnel on the left is present, bilateral weak grip strength  Proprioception tested in the upper extremities was abnormal, pronator drift.    Coordination: Rapid alternating movements in the fingers/hands were of normal speed.  The Finger-to-nose maneuver was impaired : no evidence of ataxia, dysmetria but with a low amplitude tremor.     She presented with "chin drop" to the chest , feeling too weak to lift the head.   Gait and station: Patient could barely rise unassisted from a seated position, but walked with a cane ,stooped- this has been her only assistive device.  Her neck has developed a hump.  Arm-swing on the right remains preserved.  Stance is of normal width/ base and the patient turned with  5 steps.  Toe and heel walk were deferred.  Deep tendon reflexes: in the  upper and lower extremities are symmetric. Arthritic changes to fingers wrist .    ASSESSMENT AND PLAN 89 y.o. year old female Parkinson patient ,  here with:    1) Ongoing PD, or parkinsonism.  Stooped posture, gait without shuffling but less steady, drifting to the left. Fragmented turns with cane.  4 times a day SINEMET  IR 25/100 mg, no tremor seen in our visit.   2) severe leg edema - can she try ZINC salve for topical application to  reduce redness?  Compression stockings have helped too.  On TORSEMIDE  and potassium supplements.   3) Suffers from postherpetic neuralgia- in Th 10 right side, improvement in  pain by using lidocaine  patches, EMLA  cream and gabapentin .   4) Osteoporotic neck stoop - and cervicalgia, tension neck pain. Its not ante colli- not a dystonia , it's arthritis, DDD and osteoporotic changes in  conjunction with parkinsonian stoop.    I ordered  a DAT scan .    I plan to follow up either personally or through our NP within 9 months.   I would like to thank  Torres, Gloria, Fnp [address] [place],  [place] [address] for allowing me to meet with and to take care of this pleasant patient.    After spending a total time of  31  minutes face to face and additional time for physical and neurologic examination, review of laboratory studies,  personal review of imaging studies, reports and results of other testing and review of referral information / records as far as provided in visit,   Electronically signed by: Joanne Banner, MD 10/07/2023 3:23 PM  [facility] and [facility] Sleep Board certified by The [employer] of Sleep Medicine and Diplomate of the [employer] of Sleep Medicine. Board certified In Neurology through the ABPN, Fellow of the [employer] of Neu[employer].

## 2023-10-08 NOTE — Telephone Encounter (Signed)
 Spoke with pt, prescription was being filled by Dr. Vertis Gosselin NP, Wandra Gustin before pt came to our practice. Pt has enough Torsemide  left until her appointment with Art Bigness. Will discuss medication then

## 2023-10-13 ENCOUNTER — Telehealth: Payer: Self-pay | Admitting: Neurology

## 2023-10-13 NOTE — Telephone Encounter (Signed)
 Pt requesting a Letter Dr.Dohmeier wrote to her attorney. Best number to reach pt is on her home #.

## 2023-10-16 ENCOUNTER — Ambulatory Visit
Admission: RE | Admit: 2023-10-16 | Discharge: 2023-10-16 | Disposition: A | Discharge status: Home / Self Care | Source: Ambulatory Visit | Attending: Family Medicine | Admitting: Family Medicine

## 2023-10-16 ENCOUNTER — Other Ambulatory Visit: Payer: Self-pay

## 2023-10-16 VITALS — BP 139/78 | HR 79 | Temp 97.9°F | Resp 20

## 2023-10-16 DIAGNOSIS — L03115 Cellulitis of right lower limb: Secondary | ICD-10-CM

## 2023-10-16 DIAGNOSIS — S8011XA Contusion of right lower leg, initial encounter: Secondary | ICD-10-CM

## 2023-10-16 MED ORDER — DOXYCYCLINE HYCLATE 100 MG PO CAPS: 100.0000 mg | ORAL_CAPSULE | Freq: Two times a day (BID) | ORAL | 0 refills | Status: DC

## 2023-10-16 MED ORDER — CHLORHEXIDINE GLUCONATE 4 % EX SOLN: Freq: Every day | CUTANEOUS | 0 refills | Status: DC | PRN

## 2023-10-16 MED ORDER — MUPIROCIN 2 % EX OINT: 1.0000 | TOPICAL_OINTMENT | Freq: Two times a day (BID) | CUTANEOUS | 0 refills | Status: DC

## 2023-10-16 NOTE — ED Triage Notes (Signed)
 Pt reports hit leg on Sunday "while making baskets". Pt reports RLE leg swelling,pain, and redness ever since but reports "it got worse yesterday."

## 2023-10-16 NOTE — ED Notes (Signed)
 Site cleaned with Hibiclens . Pt tolerated well.   Applied bacitracin ointment, nonadherent pad, and secured with coban. Pt tolerated well.  Site management and infection prevention education reviewed. Pt verbalized understanding.

## 2023-10-16 NOTE — Discharge Instructions (Signed)
 Take the full course of antibiotics as prescribed.  Use the Hibiclens  to clean the area at least once a day and then apply the mupirocin  ointment, a nonstick gauze pad and wrap with Coban to secure in place.  Elevate the leg at rest, wear compression stockings as tolerated and follow-up with primary care for a recheck next week.  Return sooner for worsening symptoms.

## 2023-10-16 NOTE — ED Provider Notes (Signed)
 [facility]    CSN: [id number] Arrival date & time: 10/16/23  1104      History   Chief Complaint Chief Complaint  Patient presents with   Leg Injury    Probable cellulitis - Entered by patient    HPI [name] is a 89 y.o. female.   Patient presenting today with 5 to 6-day history of progressively worsening right lower leg pain.  States she initially hit the area on a concrete planter on Sunday and had a bruise in the area and soreness.  States over the course of the week the area has become more swollen and painful and the bruise has opened up in 1 area and has been draining blood and yellow.  She is not having worsening redness, swelling and tenderness in this area the past day or so.  Denies fever, chills, numbness, tingling, decreased range of motion.  So far using antibiotic bandages, compression stockings with minimal relief.    Past Medical History:  Diagnosis Date   Ankle swelling    takes torsemide  prn   Anxiety    Arthritis    Asthma    allergy induced   Back pain    Complication of anesthesia    COVID 2023   Depression    GERD (gastroesophageal reflux disease)    HOH (hard of hearing)    Hypercholesterolemia    Hypothyroid    Parkinson disease (HCC)    PONV (postoperative nausea and vomiting)    Seasonal allergies     Patient Active Problem List   Diagnosis Date Noted   Stooped posture 10/07/2023   Mild intermittent asthma with exacerbation 09/11/2023   Bilateral lower leg cellulitis 07/24/2023   Inversion of both nipples 12/18/2022   PHN (postherpetic neuralgia) 12/18/2022   Cellulitis 12/11/2021   Cellulitis of right hand 12/10/2021   Hypocalcemia 12/10/2021   Normocytic anemia 12/10/2021   Depression 12/10/2021   Hypothyroidism 12/10/2021   Edema of left lower extremity 07/10/2021   GERD (gastroesophageal reflux disease)    History of Nissen fundoplication    IBS (irritable bowel syndrome) 02/05/2018   Diarrhea 06/16/2017    N&V (nausea and vomiting) 06/16/2017   Numbness 08/18/2015   Polyneuropathy 08/18/2015   Trigger ring finger of right hand 06/28/2015   Gastroesophageal reflux disease with esophagitis 06/15/2015   Bilateral carpal tunnel syndrome 06/15/2015   Resting tremor 06/15/2015   Primary localized osteoarthrosis, hand 05/15/2015   Carpal tunnel syndrome on left 05/15/2015   Carpal tunnel syndrome on right 05/15/2015   Scoliosis of thoracic spine 11/05/2013   Spinal stenosis, unspecified region other than cervical 12/03/2012   Spondylosis 12/03/2012   Parkinson disease (HCC)    Back pain    Seasonal allergies     Past Surgical History:  Procedure Laterality Date   ABDOMINAL HYSTERECTOMY     CARPAL TUNNEL RELEASE Right 10/26/2015   Procedure: CARPAL TUNNEL RELEASE;  Surgeon: Lyanne Sample, MD;  Location: [facility];  Service: Orthopedics;  Laterality: Right;   CARPOMETACARPEL SUSPENSION PLASTY Right 10/26/2015   Procedure: RIGHT HAND TRAPEZIECTOMY WITH THUMB METACARPEL SUSPENSION PLASTY;  Surgeon: Lyanne Sample, MD;  Location: [facility];  Service: Orthopedics;  Laterality: Right;   CATARACT EXTRACTION W/PHACO  12/23/2011   Procedure: CATARACT EXTRACTION PHACO AND INTRAOCULAR LENS PLACEMENT (IOC);  Surgeon: Clay Cummins, MD;  Location: [facility];  Service: Ophthalmology;  Laterality: Right;  CDE=5.16   CHOLECYSTECTOMY     EYE SURGERY  cataract extraction of left eye-[facility]-2002   KNEE ARTHROSCOPY     right   NISSEN FUNDOPLICATION     [facility]   TRIGGER FINGER RELEASE Right 10/26/2015   Procedure: RELEASE A-1 PULLEY RIGHT RING FINGER;  Surgeon: Lyanne Sample, MD;  Location: [facility];  Service: Orthopedics;  Laterality: Right;    OB History   No obstetric history on file.      Home Medications    Prior to Admission medications   Medication Sig Start Date End Date Taking? Authorizing Provider  chlorhexidine  (HIBICLENS ) 4 % external liquid Apply  topically daily as needed. 10/16/23  Yes Corbin Dess, PA-C  doxycycline  (VIBRAMYCIN ) 100 MG capsule Take 1 capsule (100 mg total) by mouth 2 (two) times daily. 10/16/23  Yes Corbin Dess, PA-C  mupirocin  ointment (BACTROBAN ) 2 % Apply 1 Application topically 2 (two) times daily. 10/16/23  Yes Corbin Dess, PA-C  acetaminophen  (TYLENOL ) 325 MG tablet Take 2 tablets (650 mg total) by mouth every 6 (six) hours as needed for moderate pain. 06/11/21   Adolph Hoop, PA-C  albuterol  (VENTOLIN  HFA) 108 (90 Base) MCG/ACT inhaler Inhale 1-2 puffs into the lungs every 6 (six) hours as needed for wheezing or shortness of breath. 03/03/23   Zarwolo, Gloria, FNP  benzonatate  (TESSALON ) 100 MG capsule Take 1 capsule (100 mg total) by mouth 3 (three) times daily as needed for cough. 08/28/23   Leath-Warren, Belen Bowers, NP  carbidopa -levodopa  (SINEMET  IR) 25-100 MG tablet Take 1 tablet by mouth 4 (four) times daily. 04/09/23   Dohmeier, Raoul Byes, MD  escitalopram (LEXAPRO) 10 MG tablet Take 10 mg by mouth daily. 11/26/21   [provider]  famotidine  (PEPCID ) 20 MG tablet Take one dose before evening meal each day, may take second dose if needed. No more than two per day. 01/01/22   Evander Hills, PA-C  fluticasone  (FLONASE ) 50 MCG/ACT nasal spray Place 2 sprays into both nostrils daily. 01/24/23   Leath-Warren, Belen Bowers, NP  fluticasone -salmeterol (ADVAIR HFA) 230-21 MCG/ACT inhaler Inhale 2 puffs into the lungs 2 (two) times daily. 07/24/23   Del Amber Bail, Rogerio Clay, FNP  gabapentin  (NEURONTIN ) 100 MG capsule TAKE 3 CAPSULES BY MOUTH 3 TIMES DAILY 10/07/23   Zarwolo, Gloria, FNP  guaiFENesin  (ROBITUSSIN) 100 MG/5ML liquid Take 5 mLs by mouth every 4 (four) hours as needed for cough or to loosen phlegm. 09/11/23   Zarwolo, Gloria, FNP  ketoconazole  (NIZORAL ) 2 % cream Apply 1 application topically daily. 06/11/21   Adolph Hoop, PA-C  levocetirizine (XYZAL ) 5 MG tablet Take 1 tablet (5 mg  total) by mouth every evening. 09/11/23   Zarwolo, Gloria, FNP  levothyroxine  (SYNTHROID , LEVOTHROID) 25 MCG tablet Take 25 mcg by mouth daily. 03/09/14   [provider]  Lido-Capsaicin-Men-Methyl Sal (MEDI-PATCH-LIDOCAINE ) 0.5-0.035-5-20 % PTCH Apply once a day to neuralgia area, right abdomen and below the rib cage. 12/03/22   Dohmeier, Raoul Byes, MD  lidocaine -prilocaine  (EMLA ) cream Apply 1 Application topically as needed. 10/07/23   Dohmeier, Raoul Byes, MD  Multiple Vitamins-Minerals (ZINC PO) Take by mouth daily.    [provider]  potassium chloride (MICRO-K) 10 MEQ CR capsule Take 20 mEq by mouth daily.  08/13/13   [provider]  torsemide  (DEMADEX ) 10 MG tablet Take 10 mg by mouth daily. 1/2 to 1 tab daily    [provider]  Vitamin D , Ergocalciferol , (DRISDOL ) 1.25 MG (50000 UNIT) CAPS capsule Take 1 capsule (50,000 Units total) by mouth every 7 (  seven) days. 12/24/22   Zarwolo, Gloria, FNP  zinc oxide (BALMEX ADULT CARE) 11.3 % CREA cream Apply 1 Application topically 2 (two) times daily. Apply twice a day to the  edematous legs 10/07/23   Dohmeier, Raoul Byes, MD    Family History Family History  Problem Relation Age of Onset   Stroke Mother    COPD Father    Stroke Father    Colon cancer Neg Hx     Social History Social History   Tobacco Use   Smoking status: Never   Smokeless tobacco: Never  Vaping Use   Vaping status: Never Used  Substance Use Topics   Alcohol use: Yes    Alcohol/week: 1.0 standard drink of alcohol    Types: 1 Glasses of wine per week    Comment: occ   Drug use: No     Allergies   Cephalosporins, Latex, Lactose intolerance (gi), Lactulose, Cephalexin, Levofloxacin, Meperidine , Meperidine  and related, Meperidine  hcl, and Omeprazole   Review of Systems Review of Systems Per HPI  Physical Exam Triage Vital Signs ED Triage Vitals  Encounter Vitals Group     BP 10/16/23 1112 139/78     Systolic BP Percentile --       Diastolic BP Percentile --      Pulse Rate 10/16/23 1112 79     Resp 10/16/23 1112 20     Temp 10/16/23 1112 97.9 F (36.6 C)     Temp Source 10/16/23 1112 Oral     SpO2 10/16/23 1112 90 %     Weight --      Height --      Head Circumference --      Peak Flow --      Pain Score 10/16/23 1113 4     Pain Loc --      Pain Education --      Exclude from Growth Chart --    No data found.  Updated Vital Signs BP 139/78 (BP Location: Right Arm)   Pulse 79   Temp 97.9 F (36.6 C) (Oral)   Resp 20   SpO2 90%   Visual Acuity Right Eye Distance:   Left Eye Distance:   Bilateral Distance:    Right Eye Near:   Left Eye Near:    Bilateral Near:     Physical Exam Vitals and nursing note reviewed.  Constitutional:      Appearance: Normal appearance. She is not ill-appearing.  HENT:     Head: Atraumatic.  Eyes:     Extraocular Movements: Extraocular movements intact.     Conjunctiva/sclera: Conjunctivae normal.  Cardiovascular:     Rate and Rhythm: Normal rate.  Pulmonary:     Effort: Pulmonary effort is normal.  Musculoskeletal:        General: Swelling, tenderness and signs of injury present. Normal range of motion.     Cervical back: Normal range of motion and neck supple.  Skin:    General: Skin is warm.     Findings: Erythema present.     Comments: Small scabbed area to the anterior right lower leg with surrounding erythema, edema, warmth.  No fluctuance, induration to the area  Neurological:     Mental Status: She is alert and oriented to person, place, and time.     Motor: No weakness.     Gait: Gait normal.     Comments: Right lower extremity neurovascularly intact  Psychiatric:        Mood and Affect: Mood normal.  Thought Content: Thought content normal.        Judgment: Judgment normal.      UC Treatments / Results  Labs (all labs ordered are listed, but only abnormal results are displayed) Labs Reviewed - No data to  display  EKG   Radiology No results found.  Procedures Procedures (including critical care time)  Medications Ordered in UC Medications - No data to display  Initial Impression / Assessment and Plan / UC Course  I have reviewed the triage vital signs and the nursing notes.  Pertinent labs & imaging results that were available during my care of the patient were reviewed by me and considered in my medical decision making (see chart for details).     Consistent with cellulitis.  Treat with good home wound care with Hibiclens , mupirocin , nonstick dressings, leg elevation, compression as tolerated and will place on a course of doxycycline .  Follow-up with PCP for recheck.  Return sooner for worsening symptoms.  Final Clinical Impressions(s) / UC Diagnoses   Final diagnoses:  Cellulitis of leg, right  Contusion of right lower extremity, initial encounter     Discharge Instructions      Take the full course of antibiotics as prescribed.  Use the Hibiclens  to clean the area at least once a day and then apply the mupirocin  ointment, a nonstick gauze pad and wrap with Coban to secure in place.  Elevate the leg at rest, wear compression stockings as tolerated and follow-up with primary care for a recheck next week.  Return sooner for worsening symptoms.  ED Prescriptions     Medication Sig Dispense Auth. Provider   doxycycline  (VIBRAMYCIN ) 100 MG capsule Take 1 capsule (100 mg total) by mouth 2 (two) times daily. 14 capsule Corbin Dess, PA-C   mupirocin  ointment (BACTROBAN ) 2 % Apply 1 Application topically 2 (two) times daily. 22 g Corbin Dess, PA-C   chlorhexidine  (HIBICLENS ) 4 % external liquid Apply topically daily as needed. 236 mL Corbin Dess, [place]      PDMP not reviewed this encounter.   Corbin Dess, [place] 10/16/23 1142

## 2023-10-28 ENCOUNTER — Encounter (HOSPITAL_COMMUNITY)
Admission: RE | Admit: 2023-10-28 | Discharge: 2023-10-28 | Disposition: A | Discharge status: Home / Self Care | Source: Ambulatory Visit | Attending: Neurology | Admitting: Neurology

## 2023-10-28 DIAGNOSIS — G20A2 Parkinson's disease without dyskinesia, with fluctuations: Secondary | ICD-10-CM | POA: Insufficient documentation

## 2023-10-28 DIAGNOSIS — R293 Abnormal posture: Secondary | ICD-10-CM | POA: Diagnosis present

## 2023-10-28 DIAGNOSIS — M4124 Other idiopathic scoliosis, thoracic region: Secondary | ICD-10-CM | POA: Insufficient documentation

## 2023-10-28 DIAGNOSIS — B0229 Other postherpetic nervous system involvement: Secondary | ICD-10-CM | POA: Insufficient documentation

## 2023-10-28 MED ORDER — POTASSIUM IODIDE (ANTIDOTE) 130 MG PO TABS
ORAL_TABLET | ORAL | Status: AC
  Filled 2023-10-28: qty 1

## 2023-10-28 MED ORDER — IOFLUPANE I 123 185 MBQ/2.5ML IV SOLN
4.0000 | Freq: Once | INTRAVENOUS | Status: AC | PRN
Start: 2023-10-28 — End: 2023-10-28
  Administered 2023-10-28: 4 via INTRAVENOUS
  Filled 2023-10-28: qty 5

## 2023-10-29 ENCOUNTER — Ambulatory Visit: Payer: Self-pay

## 2023-10-29 VITALS — BP 147/75 | HR 88 | Resp 16 | Ht 60.0 in | Wt 165.0 lb

## 2023-10-29 DIAGNOSIS — L03115 Cellulitis of right lower limb: Secondary | ICD-10-CM

## 2023-10-29 DIAGNOSIS — B0229 Other postherpetic nervous system involvement: Secondary | ICD-10-CM

## 2023-10-29 NOTE — Assessment & Plan Note (Signed)
 Recommend decreasing Gabapentin  to 100 mg three times a day for a few weeks, then decrease to two times a day for a few weeks, then decrease to one capsule at bedtime.

## 2023-10-29 NOTE — Progress Notes (Unsigned)
   Acute Office Visit  Subjective:     Patient ID: [name], female    DOB: 09-08-1933, 89 y.o.   MRN: [id number]  Chief Complaint  Patient presents with   UC follow up    Was seen on 10/16/23 at the urgent care for RLE cellulitis. Finished course of doxycycline . Still draining some yellowish to clear pus. Has swelling in lower leg and is sore to the touch. Reports that it goes down overnight but once on her feet again, it swells back up    HPI Patient is in today for f/u of cellulitis  ROS     Objective:    BP (!) 147/75   Pulse 88   Resp 16   Ht 5' (1.524 m)   Wt 165 lb (74.8 kg)   SpO2 92%   BMI 32.22 kg/m  {Vitals History (Optional):23777}  Physical Exam  No results found for any visits on 10/29/23.      Assessment & Plan:   Problem List Items Addressed This Visit   None   No orders of the defined types were placed in this encounter.   No follow-ups on file.  Alison Irvine, FNP

## 2023-11-03 NOTE — Assessment & Plan Note (Signed)
 Was seen in UC on 10/16/23 and treated with oral doxy.  She has had significant improvement with this.  Recommend wearing her compression hose/sock daily to prevent future episodes as well as walking daily and elevating legs when at rest.

## 2023-11-05 ENCOUNTER — Ambulatory Visit: Payer: Self-pay | Admitting: Neurology

## 2023-11-07 ENCOUNTER — Emergency Department (HOSPITAL_COMMUNITY)

## 2023-11-07 ENCOUNTER — Ambulatory Visit: Payer: Self-pay

## 2023-11-07 ENCOUNTER — Other Ambulatory Visit: Payer: Self-pay

## 2023-11-07 ENCOUNTER — Emergency Department (HOSPITAL_COMMUNITY)
Admission: EM | Admit: 2023-11-07 | Discharge: 2023-11-07 | Disposition: A | Discharge status: Home / Self Care | Attending: Emergency Medicine | Admitting: Emergency Medicine

## 2023-11-07 ENCOUNTER — Encounter (HOSPITAL_COMMUNITY): Payer: Self-pay

## 2023-11-07 DIAGNOSIS — K5792 Diverticulitis of intestine, part unspecified, without perforation or abscess without bleeding: Secondary | ICD-10-CM

## 2023-11-07 DIAGNOSIS — K5732 Diverticulitis of large intestine without perforation or abscess without bleeding: Secondary | ICD-10-CM | POA: Diagnosis not present

## 2023-11-07 DIAGNOSIS — W19XXXA Unspecified fall, initial encounter: Secondary | ICD-10-CM

## 2023-11-07 DIAGNOSIS — R1903 Right lower quadrant abdominal swelling, mass and lump: Secondary | ICD-10-CM

## 2023-11-07 DIAGNOSIS — W01198A Fall on same level from slipping, tripping and stumbling with subsequent striking against other object, initial encounter: Secondary | ICD-10-CM | POA: Diagnosis not present

## 2023-11-07 DIAGNOSIS — Z91040 Latex allergy status: Secondary | ICD-10-CM | POA: Insufficient documentation

## 2023-11-07 DIAGNOSIS — S0990XA Unspecified injury of head, initial encounter: Secondary | ICD-10-CM | POA: Insufficient documentation

## 2023-11-07 LAB — COMPREHENSIVE METABOLIC PANEL WITH GFR
ALT: 7 U/L (ref 0–44)
AST: 17 U/L (ref 15–41)
Albumin: 3.9 g/dL (ref 3.5–5.0)
Alkaline Phosphatase: 68 U/L (ref 38–126)
Anion gap: 11 (ref 5–15)
BUN: 12 mg/dL (ref 8–23)
CO2: 25 mmol/L (ref 22–32)
Calcium: 8.8 mg/dL — ABNORMAL LOW (ref 8.9–10.3)
Chloride: 101 mmol/L (ref 98–111)
Creatinine, Ser: 0.94 mg/dL (ref 0.44–1.00)
GFR, Estimated: 58 mL/min — ABNORMAL LOW (ref >60)
Glucose, Bld: 119 mg/dL — ABNORMAL HIGH (ref 70–99)
Potassium: 4.4 mmol/L (ref 3.5–5.1)
Sodium: 137 mmol/L (ref 135–145)
Total Bilirubin: 0.8 mg/dL (ref 0.0–1.2)
Total Protein: 7.1 g/dL (ref 6.5–8.1)

## 2023-11-07 LAB — CBC WITH DIFFERENTIAL/PLATELET
Abs Immature Granulocytes: 0.11 10*3/uL — ABNORMAL HIGH (ref 0.00–0.07)
Basophils Absolute: 0 10*3/uL (ref 0.0–0.1)
Basophils Relative: 0 %
Eosinophils Absolute: 0.1 10*3/uL (ref 0.0–0.5)
Eosinophils Relative: 1 %
HCT: 39.2 % (ref 36.0–46.0)
Hemoglobin: 12.8 g/dL (ref 12.0–15.0)
Immature Granulocytes: 1 %
Lymphocytes Relative: 22 %
Lymphs Abs: 1.9 10*3/uL (ref 0.7–4.0)
MCH: 31.1 pg (ref 26.0–34.0)
MCHC: 32.7 g/dL (ref 30.0–36.0)
MCV: 95.4 fL (ref 80.0–100.0)
Monocytes Absolute: 1.8 10*3/uL — ABNORMAL HIGH (ref 0.1–1.0)
Monocytes Relative: 21 %
Neutro Abs: 4.7 10*3/uL (ref 1.7–7.7)
Neutrophils Relative %: 55 %
Platelets: 187 10*3/uL (ref 150–400)
RBC: 4.11 MIL/uL (ref 3.87–5.11)
RDW: 14.3 % (ref 11.5–15.5)
WBC: 8.6 10*3/uL (ref 4.0–10.5)
nRBC: 0 % (ref 0.0–0.2)

## 2023-11-07 LAB — URINALYSIS, ROUTINE W REFLEX MICROSCOPIC
Bilirubin Urine: NEGATIVE
Glucose, UA: NEGATIVE mg/dL
Ketones, ur: NEGATIVE mg/dL
Nitrite: POSITIVE — AB
Protein, ur: NEGATIVE mg/dL
Specific Gravity, Urine: 1.034 — ABNORMAL HIGH (ref 1.005–1.030)
pH: 6 (ref 5.0–8.0)

## 2023-11-07 MED ORDER — SULFAMETHOXAZOLE-TRIMETHOPRIM 800-160 MG PO TABS
1.0000 | ORAL_TABLET | Freq: Once | ORAL | Status: AC
  Administered 2023-11-07: 1 via ORAL
  Filled 2023-11-07: qty 1

## 2023-11-07 MED ORDER — ONDANSETRON HCL 4 MG/2ML IJ SOLN
4.0000 mg | Freq: Once | INTRAMUSCULAR | Status: AC
  Administered 2023-11-07: 4 mg via INTRAVENOUS
  Filled 2023-11-07: qty 2

## 2023-11-07 MED ORDER — METRONIDAZOLE 500 MG PO TABS: 500.0000 mg | ORAL_TABLET | Freq: Two times a day (BID) | ORAL | 0 refills | Status: DC

## 2023-11-07 MED ORDER — ONDANSETRON 4 MG PO TBDP
4.0000 mg | ORAL_TABLET | Freq: Three times a day (TID) | ORAL | 0 refills | Status: DC | PRN
Start: 2023-11-07 — End: 2023-12-11

## 2023-11-07 MED ORDER — IOHEXOL 300 MG/ML  SOLN
100.0000 mL | Freq: Once | INTRAMUSCULAR | Status: AC | PRN
  Administered 2023-11-07: 100 mL via INTRAVENOUS

## 2023-11-07 MED ORDER — SULFAMETHOXAZOLE-TRIMETHOPRIM 800-160 MG PO TABS
1.0000 | ORAL_TABLET | Freq: Two times a day (BID) | ORAL | 0 refills | Status: AC
Start: 2023-11-07 — End: 2023-11-14

## 2023-11-07 MED ORDER — METRONIDAZOLE 500 MG PO TABS
500.0000 mg | ORAL_TABLET | Freq: Once | ORAL | Status: AC
  Administered 2023-11-07: 500 mg via ORAL
  Filled 2023-11-07: qty 1

## 2023-11-07 NOTE — ED Provider Notes (Signed)
 [place] [facility] DEPARTMENT AT [facility] Provider Note   CSN: [id number] Arrival date & time: 11/07/23  1759     History  Chief Complaint  Patient presents with   [name] is a 89 y.o. female.  HPI Adult female arrives with her daughter who provides some of the history. Patient has a history of IBS, but over the past day has had atypical lower abdominal pain.  Pain is diffuse across the lower abdomen with associated nausea, vomiting, diarrhea and anorexia.  Today the patient was en route to urgent care when she had a mechanical fall striking the back of her head as she fell.  No loss of consciousness, no new focal weakness, she does not feel quite back to normal yet.     Home Medications Prior to Admission medications   Medication Sig Start Date End Date Taking? Authorizing Provider  metroNIDAZOLE (FLAGYL) 500 MG tablet Take 1 tablet (500 mg total) by mouth 2 (two) times daily. 11/07/23  Yes Dorenda Gandy, MD  ondansetron  (ZOFRAN -ODT) 4 MG disintegrating tablet Take 1 tablet (4 mg total) by mouth every 8 (eight) hours as needed for nausea or vomiting. 11/07/23  Yes Dorenda Gandy, MD  sulfamethoxazole -trimethoprim  (BACTRIM  DS) 800-160 MG tablet Take 1 tablet by mouth 2 (two) times daily for 7 days. 11/07/23 11/14/23 Yes Dorenda Gandy, MD  acetaminophen  (TYLENOL ) 325 MG tablet Take 2 tablets (650 mg total) by mouth every 6 (six) hours as needed for moderate pain. 06/11/21   Adolph Hoop, PA-C  albuterol  (VENTOLIN  HFA) 108 (90 Base) MCG/ACT inhaler Inhale 1-2 puffs into the lungs every 6 (six) hours as needed for wheezing or shortness of breath. 03/03/23   Zarwolo, Gloria, FNP  carbidopa -levodopa  (SINEMET  IR) 25-100 MG tablet Take 1 tablet by mouth 4 (four) times daily. 04/09/23   Dohmeier, Raoul Byes, MD  chlorhexidine  (HIBICLENS ) 4 % external liquid Apply topically daily as needed. 10/16/23   Corbin Dess, PA-C  escitalopram (LEXAPRO) 10 MG tablet  Take 10 mg by mouth daily. 11/26/21   [provider]  famotidine  (PEPCID ) 20 MG tablet Take one dose before evening meal each day, may take second dose if needed. No more than two per day. 01/01/22   Evander Hills, PA-C  fluticasone  (FLONASE ) 50 MCG/ACT nasal spray Place 2 sprays into both nostrils daily. 01/24/23   Leath-Warren, Belen Bowers, NP  fluticasone -salmeterol (ADVAIR HFA) 230-21 MCG/ACT inhaler Inhale 2 puffs into the lungs 2 (two) times daily. 07/24/23   Del Orbe Polanco, Rogerio Clay, FNP  gabapentin  (NEURONTIN ) 100 MG capsule TAKE 3 CAPSULES BY MOUTH 3 TIMES DAILY 10/07/23   Zarwolo, Gloria, FNP  guaiFENesin  (ROBITUSSIN) 100 MG/5ML liquid Take 5 mLs by mouth every 4 (four) hours as needed for cough or to loosen phlegm. 09/11/23   Zarwolo, Gloria, FNP  ketoconazole  (NIZORAL ) 2 % cream Apply 1 application topically daily. 06/11/21   Adolph Hoop, PA-C  levocetirizine (XYZAL ) 5 MG tablet Take 1 tablet (5 mg total) by mouth every evening. 09/11/23   Zarwolo, Gloria, FNP  levothyroxine  (SYNTHROID , LEVOTHROID) 25 MCG tablet Take 25 mcg by mouth daily. 03/09/14   [provider]  Lido-Capsaicin-Men-Methyl Sal (MEDI-PATCH-LIDOCAINE ) 0.5-0.035-5-20 % PTCH Apply once a day to neuralgia area, right abdomen and below the rib cage. 12/03/22   Dohmeier, Raoul Byes, MD  lidocaine -prilocaine  (EMLA ) cream Apply 1 Application topically as needed. 10/07/23   Dohmeier, Raoul Byes, MD  Multiple Vitamins-Minerals (ZINC PO) Take by mouth daily.  [provider]  mupirocin  ointment (BACTROBAN ) 2 % Apply 1 Application topically 2 (two) times daily. 10/16/23   Corbin Dess, PA-C  potassium chloride (MICRO-K) 10 MEQ CR capsule Take 20 mEq by mouth daily.  08/13/13   [provider]  torsemide  (DEMADEX ) 10 MG tablet Take 10 mg by mouth daily. 1/2 to 1 tab daily    [provider]  Vitamin D , Ergocalciferol , (DRISDOL ) 1.25 MG (50000 UNIT) CAPS capsule Take 1 capsule (50,000 Units total) by  mouth every 7 (seven) days. 12/24/22   Zarwolo, Gloria, FNP  zinc oxide (BALMEX ADULT CARE) 11.3 % CREA cream Apply 1 Application topically 2 (two) times daily. Apply twice a day to the  edematous legs 10/07/23   Dohmeier, Raoul Byes, MD      Allergies    Cephalosporins, Latex, Lactose intolerance (gi), Lactulose, Cephalexin, Levofloxacin, Meperidine , Meperidine  and related, Meperidine  hcl, and Omeprazole    Review of Systems   Review of Systems  Physical Exam Updated Vital Signs BP 104/85   Pulse 89   Temp 98.2 F (36.8 C) (Oral)   Resp 18   Ht 5' (1.524 m)   Wt 74.8 kg   SpO2 94%   BMI 32.21 kg/m  Physical Exam Vitals and nursing note reviewed.  Constitutional:      General: She is not in acute distress.    Appearance: She is well-developed.  HENT:     Head: Normocephalic and atraumatic.  Eyes:     Conjunctiva/sclera: Conjunctivae normal.  Neck:   Pulmonary:     Effort: Pulmonary effort is normal. No respiratory distress.     Breath sounds: No stridor.  Abdominal:     General: There is no distension.     Tenderness: There is abdominal tenderness. There is guarding.  Skin:    General: Skin is warm and dry.  Neurological:     Mental Status: She is alert and oriented to person, place, and time.     Cranial Nerves: No cranial nerve deficit.     Comments: Unremarkable neuroexam  Psychiatric:        Mood and Affect: Mood normal.     ED Results / Procedures / Treatments   Labs (all labs ordered are listed, but only abnormal results are displayed) Labs Reviewed  CBC WITH DIFFERENTIAL/PLATELET - Abnormal; Notable for the following components:      Result Value   Monocytes Absolute 1.8 (*)    Abs Immature Granulocytes 0.11 (*)    All other components within normal limits  COMPREHENSIVE METABOLIC PANEL WITH GFR - Abnormal; Notable for the following components:   Glucose, Bld 119 (*)    Calcium  8.8 (*)    GFR, Estimated 58 (*)    All other components within normal  limits  URINALYSIS, ROUTINE W REFLEX MICROSCOPIC    EKG None  Radiology CT Head Wo Contrast Result Date: 11/07/2023 EXAM: CT HEAD WITHOUT 11/07/2023 01:09:32 PM TECHNIQUE: CT of the head was performed without the administration of intravenous contrast. Automated exposure control, iterative reconstruction, and/or weight based adjustment of the mA/kV was utilized to reduce the radiation dose to as low as reasonably achievable. COMPARISON: 02/17/2010 CLINICAL HISTORY: Polytrauma, blunt. Pt arrived via POV from home c/o falling and hitting her head. Pt reports recently she has been having an upset stomach and her and her family were rushing around the home to get to Urgent Care for evaluation of her stomach problems and she fell, hitting her head on the wall. Pt  denies blood thinners. Pt denies LOC. Pt also reports recently having diarrhea and emesis. FINDINGS: BRAIN AND VENTRICLES: There is no acute intracranial hemorrhage, mass effect or midline shift. No abnormal extra-axial fluid collection. The gray-white differentiation is maintained without evidence of an acute infarct. There is stable ventriculomegaly. Global cortical atrophy. Subcortical and periventricular small vessel ischemic changes. ORBITS: The visualized portion of the orbits demonstrate no acute abnormality. SINUSES: The visualized paranasal sinuses and mastoid air cells demonstrate no acute abnormality. SOFT TISSUES AND SKULL: Mild soft tissue swelling / extracranial hematoma overlying the left posterior vertex (coronal image 58). No acute abnormality of the visualized skull. IMPRESSION: 1. No acute intracranial abnormality. 2. Mild soft tissue swelling/extracranial hematoma overlying the left posterior vertex. Electronically signed by: Zadie Herter MD 11/07/2023 09:40 PM EDT RP Workstation: [id number]   CT Cervical Spine Wo Contrast Result Date: 11/07/2023 EXAM: CT CERVICAL SPINE WITHOUT CONTRAST 11/07/2023 08:29:22 PM TECHNIQUE: CT of  the cervical was performed without the administration of intravenous contrast. Multiplanar reformatted images are provided for review. Automated exposure control, iterative reconstruction, and/or weight based adjustment of the mA/kV was utilized to reduce the radiation dose to as low as reasonably achievable. COMPARISON: None available. CLINICAL HISTORY: Polytrauma, blunt. Pt arrived via POV from home c/o falling and hitting her head. Pt reports recently she has been having an upset stomach and her and her family were rushing around the home to get to Urgent Care for evaluation of her stomach problems and she fell, hitting her head on the wall. Pt denies blood thinners. Pt denies LOC. Pt also reports recently having diarrhea and emesis. FINDINGS: CERVICAL SPINE: BONES/ALIGNMENT: There is no acute fracture or traumatic malalignment. DEGENERATIVE CHANGES: Mild degenerative changes of the lower cervical spine. SOFT TISSUES: There is no prevertebral soft tissue swelling. SPINAL CANAL: Spinal canal is patent. LUNGS: Biapical pleural parenchymal scarring. IMPRESSION: 1. No acute abnormality of the cervical spine. Electronically signed by: Zadie Herter MD 11/07/2023 09:37 PM EDT RP Workstation: [id number]   CT ABDOMEN PELVIS W CONTRAST Result Date: 11/07/2023 EXAM: CT ABDOMEN AND PELVIS WITH CONTRAST 11/07/2023 08:29:22 PM TECHNIQUE: CT of the abdomen and pelvis was performed with the administration of 100mL iohexol  (OMNIPAQUE ) 300 MG/ML solution. Multiplanar reformatted images are provided for review. Automated exposure control, iterative reconstruction, and/or weight based adjustment of the mA/kV was utilized to reduce the radiation dose to as low as reasonably achievable. COMPARISON: 12/31/2001 CLINICAL HISTORY: Abdominal pain, acute, nonlocalized. Pt arrived via POV from home c/o falling and hitting her head. Pt reports recently she has been having an upset stomach and her and her family were rushing around the  home to get to Urgent Care for evaluation of her stomach problems and she fell, hitting her head on the wall. Pt denies blood thinners. Pt denies LOC. Pt also reports recently having diarrhea and emesis. FINDINGS: LOWER CHEST: Mild eventration of the left hemidiaphragm. LIVER: Liver is unremarkable. GALLBLADDER AND BILE DUCTS: Status post cholecystectomy. SPLEEN: No acute abnormality. PANCREAS: No acute abnormality. ADRENAL GLANDS: No acute abnormality. KIDNEYS, URETERS AND BLADDER: No stones in the kidneys or ureters. No hydronephrosis. No perinephric or periureteral stranding. Secondary mild left posterior bladder wall thickening/inflammatory changes (image 69). GI AND BOWEL: Surgical clips at the GE junction. Left colonic diverticulosis, with pericolonic fluid/inflammatory changes along the sigmoid colon (image 65), reflecting mild diverticulitis. No drainable fluid collection/abscess. No free air. There is no bowel obstruction. No appendicitis. PERITONEUM AND RETROPERITONEUM: No ascites. No free air. VASCULATURE: Aorta is  normal in caliber. LYMPH NODES: No lymphadenopathy. REPRODUCTIVE ORGANS: Mixed cystic and solid right adnexal mass, measuring 7.0 x 4.0 x 3.6 cm (sagittal image 59), new from remote prior. This abuts bowel but is favored to reflect a right ovarian mass, concerning for epithelial ovarian neoplasm. BONES AND SOFT TISSUES: Mild degenerative changes of the visualized thoracolumbar spine. No acute osseous abnormality. No focal soft tissue abnormality. IMPRESSION: 1. Mild acute sigmoid diverticulitis. No drainable fluid collection/abscess. No free air. 2. 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 Given patient's age, consider GYN evaluation as clinically warranted. Electronically signed by: Zadie Herter MD 11/07/2023 09:35 PM EDT RP Workstation: [id number]    Procedures Procedures    Medications Ordered in ED Medications   ondansetron  (ZOFRAN ) injection 4 mg (has no administration in time range)  metroNIDAZOLE (FLAGYL) tablet 500 mg (has no administration in time range)  sulfamethoxazole -trimethoprim  (BACTRIM  DS) 800-160 MG per tablet 1 tablet (has no administration in time range)  iohexol  (OMNIPAQUE ) 300 MG/ML solution 100 mL (100 mLs Intravenous Contrast Given 11/07/23 2015)    ED Course/ Medical Decision Making/ A&P                                 Medical Decision Making -year-old female with IBS presents with abdominal pain in the context of fall while seeking evaluation of her pain.  Concern for intracranial abnormality, fracture, as well as intra-abdominal processes including IBS flare. Cardiac 90 sinus normal Pulse ox 100% room air normal  Amount and/or Complexity of Data Reviewed Labs: ordered. Radiology: ordered.  Risk Prescription drug management.   10:27 PM On repeat exam is awake, alert, hemodynamically unremarkable.  CTs reviewed, discussed with family including reassuring head CT, neck CT.  However, patient's abdominal CT with mild diverticulitis, no perforation, no abscess as well as identification of a lesion which is new since last CT, which was 20 years ago.  No evidence of bacteremia, sepsis, no perforation, no distress, patient was started antibiotics, will follow-up with GI and gynecology.        Final Clinical Impression(s) / ED Diagnoses Final diagnoses:  Fall, initial encounter  Injury of head, initial encounter  Diverticulitis  Right lower quadrant abdominal mass    Rx / DC Orders ED Discharge Orders          Ordered    metroNIDAZOLE (FLAGYL) 500 MG tablet  2 times daily        11/07/23 2227    sulfamethoxazole -trimethoprim  (BACTRIM  DS) 800-160 MG tablet  2 times daily        11/07/23 2227    ondansetron  (ZOFRAN -ODT) 4 MG disintegrating tablet  Every 8 hours PRN        11/07/23 2227              Dorenda Gandy, MD 11/07/23 2227

## 2023-11-07 NOTE — Discharge Instructions (Signed)
 Below is the interpretation of today's CT scan of your abdomen:  IMPRESSION: 1. Mild acute sigmoid diverticulitis. No drainable fluid collection/abscess. No free air. 2. 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 Given patient's age, consider GYN evaluation as clinically warranted.

## 2023-11-07 NOTE — ED Triage Notes (Signed)
 Pt arrived via POV from home c/o falling and hitting her head. Pt reports recently she has been having an upset stomach and her and her family were rushing around the home to get to Urgent Care for evaluation of her stomach problems and she fell, hitting her head on the wall. Pt denies blood thinners. Pt denies LOC. Pt also reports recently having diarrhea and emesis.

## 2023-11-23 IMAGING — US US EXTREM LOW VENOUS*L*
1 series · 13 of 24 positions shown · non-contrast
Comparison: None.

CLINICAL DATA: 87-year-old female with left lower extremity edema.

EXAM:
LEFT LOWER EXTREMITY VENOUS DOPPLER ULTRASOUND
TECHNIQUE: Gray-scale sonography with graded compression, as well as color
Doppler and duplex ultrasound were performed to evaluate the left
lower extremity deep venous systems from the level of the common
femoral vein and including the common femoral, femoral, profunda
femoral, popliteal and calf veins including the posterior tibial,
peroneal and gastrocnemius veins when visible. Spectral Doppler was
utilized to evaluate flow at rest and with distal augmentation
maneuvers in the common femoral, femoral and popliteal veins. The
contralateral common femoral vein was also evaluated for comparison.

[Series 1: us venous img lower uni left (dvt) · portal-venous · 13 of 40 slices shown]
[im 1/40]
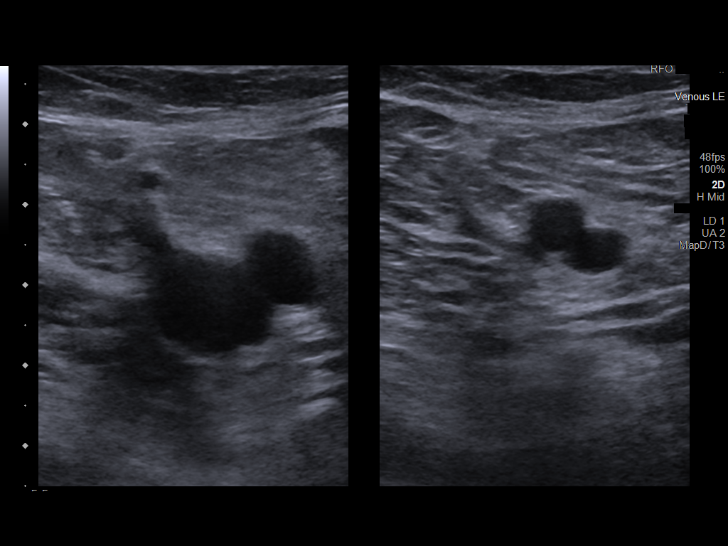
[im 4/40]
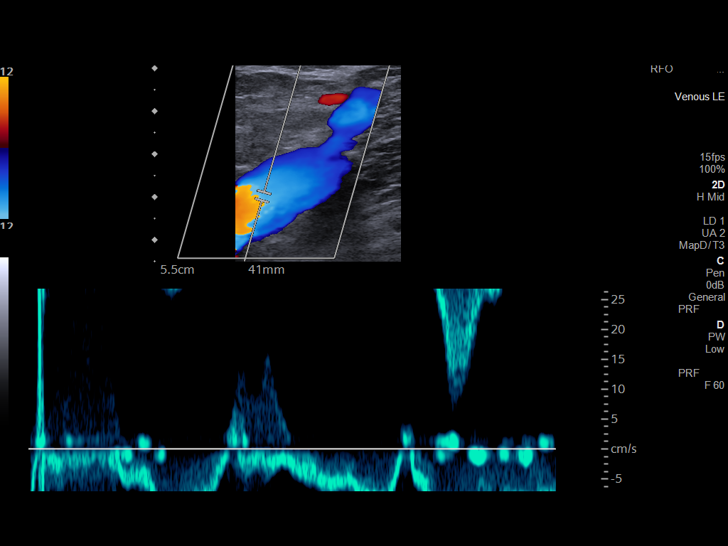
[im 7/40]
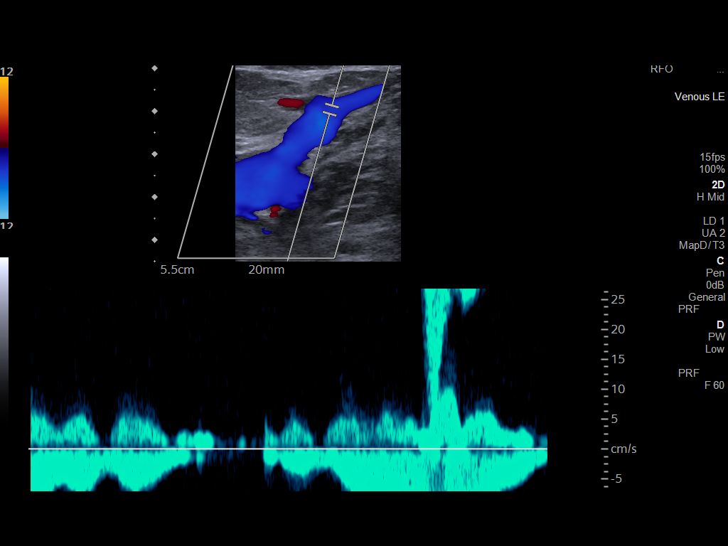
[im 11/40]
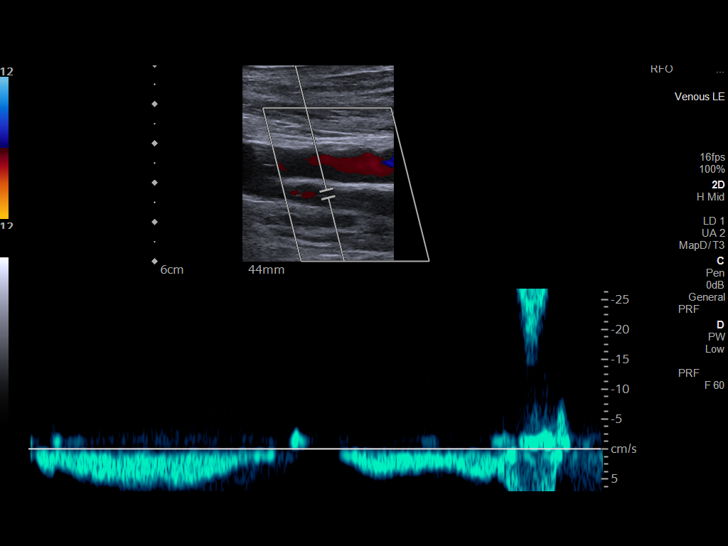
[im 14/40]
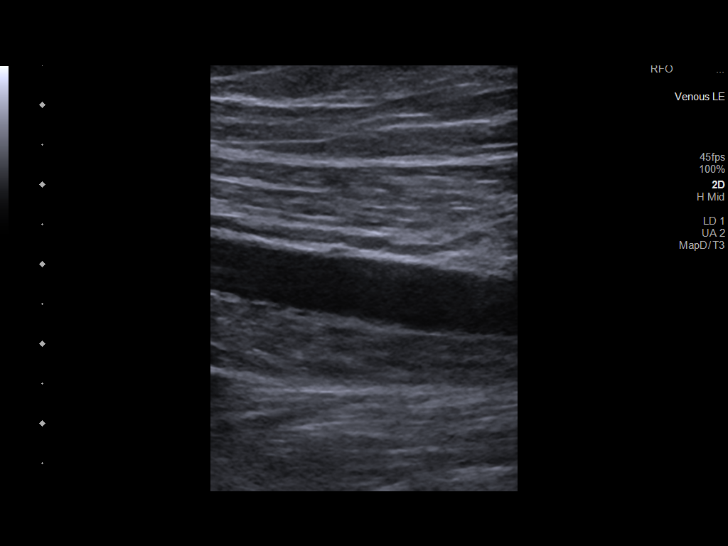
[im 17/40]
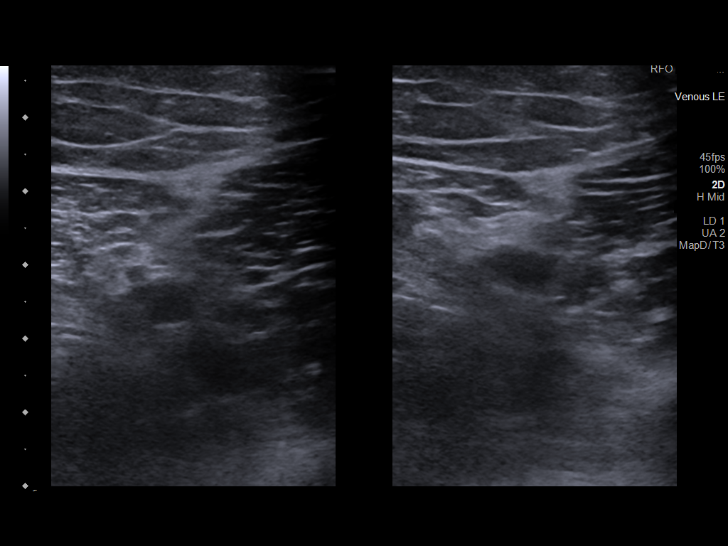
[im 21/40]
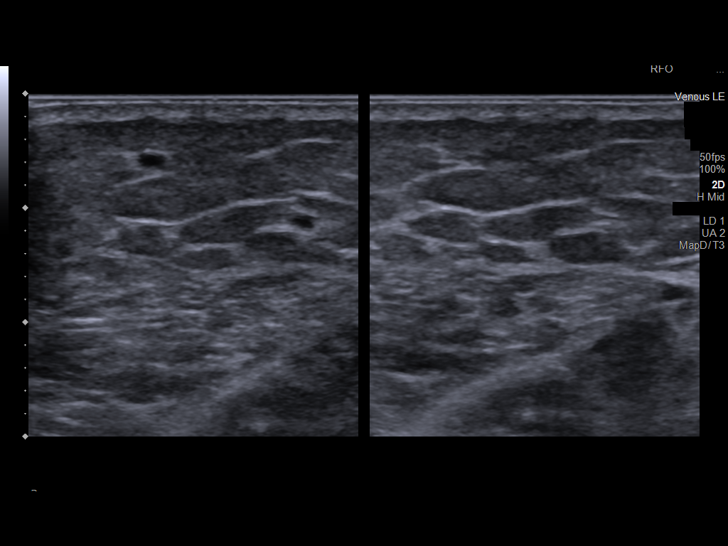
[im 23/40]
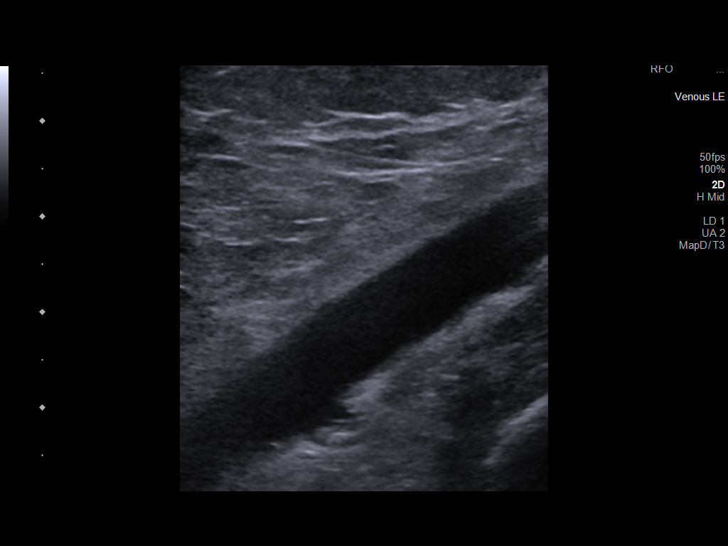
[im 26/40]
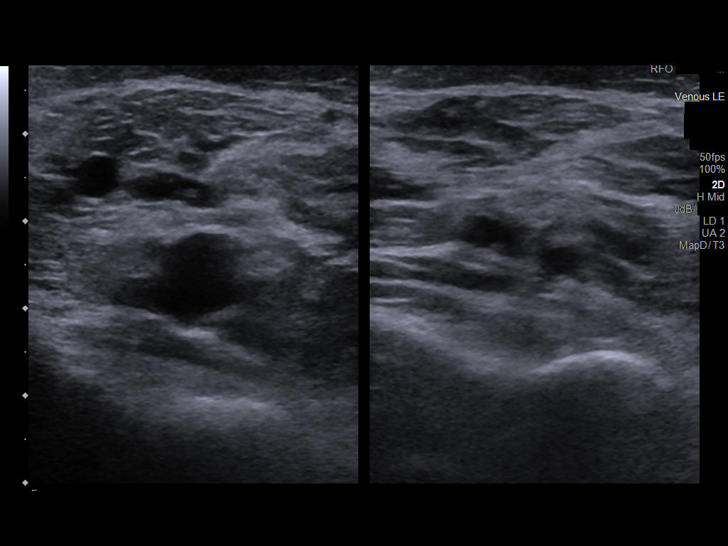
[im 29/40]
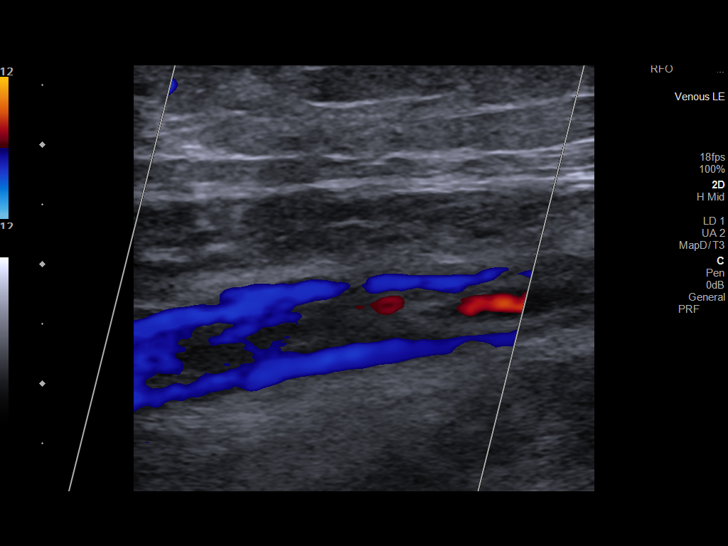
[im 33/40]
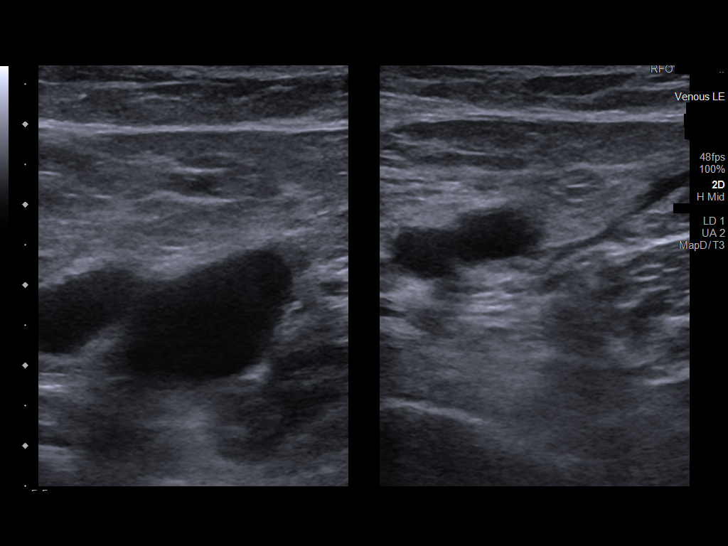
[im 36/40]
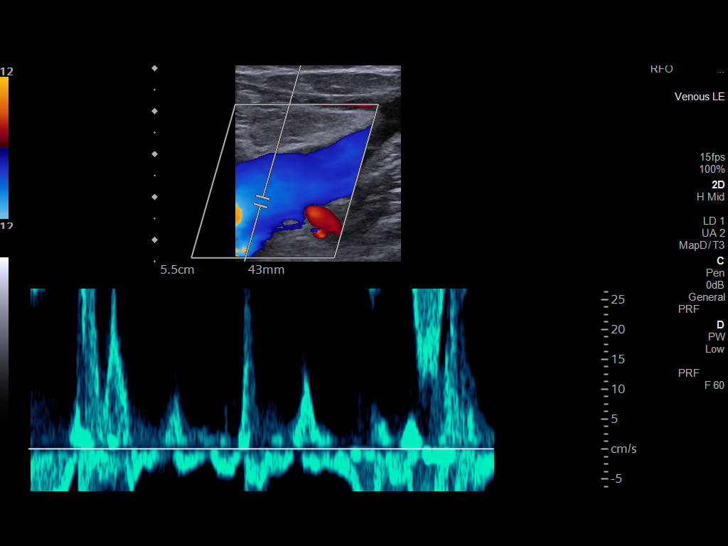
[im 40/40]
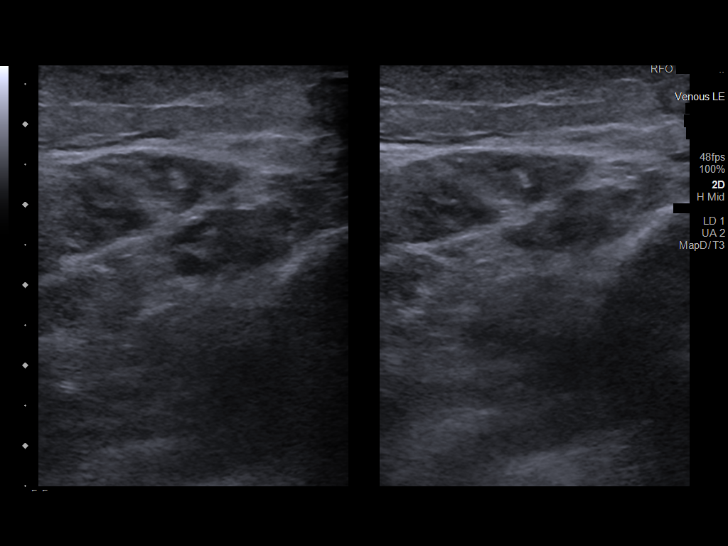

[13 of 24 positions shown; findings below may reference images not displayed]

FINDINGS: LEFT LOWER EXTREMITY

Common Femoral Vein: No evidence of thrombus. Normal
compressibility, respiratory phasicity and response to augmentation.

Central Greater Saphenous Vein: No evidence of thrombus. Normal
compressibility and flow on color Doppler imaging.

Central Profunda Femoral Vein: No evidence of thrombus. Normal
compressibility and flow on color Doppler imaging.

Femoral Vein: No evidence of thrombus. Normal compressibility,
respiratory phasicity and response to augmentation.

Popliteal Vein: No evidence of thrombus. Normal compressibility,
respiratory phasicity and response to augmentation.

Calf Veins: No evidence of thrombus. Normal compressibility and flow
on color Doppler imaging.

Other Findings:  None.

RIGHT LOWER EXTREMITY

Common Femoral Vein: No evidence of thrombus. Normal
compressibility, respiratory phasicity and response to augmentation.
IMPRESSION: No evidence of left lower extremity deep venous thrombosis.

## 2023-12-11 ENCOUNTER — Other Ambulatory Visit: Payer: Self-pay | Admitting: Family Medicine

## 2023-12-11 ENCOUNTER — Ambulatory Visit: Payer: Self-pay | Admitting: Family Medicine

## 2023-12-11 ENCOUNTER — Encounter: Payer: Self-pay | Admitting: Family Medicine

## 2023-12-11 VITALS — BP 138/69 | HR 95 | Ht 60.0 in | Wt 167.1 lb

## 2023-12-11 DIAGNOSIS — R7301 Impaired fasting glucose: Secondary | ICD-10-CM

## 2023-12-11 DIAGNOSIS — R5383 Other fatigue: Secondary | ICD-10-CM | POA: Diagnosis not present

## 2023-12-11 DIAGNOSIS — E1169 Type 2 diabetes mellitus with other specified complication: Secondary | ICD-10-CM

## 2023-12-11 DIAGNOSIS — R1030 Lower abdominal pain, unspecified: Secondary | ICD-10-CM | POA: Diagnosis not present

## 2023-12-11 DIAGNOSIS — K580 Irritable bowel syndrome with diarrhea: Secondary | ICD-10-CM | POA: Diagnosis not present

## 2023-12-11 DIAGNOSIS — E559 Vitamin D deficiency, unspecified: Secondary | ICD-10-CM

## 2023-12-11 DIAGNOSIS — R112 Nausea with vomiting, unspecified: Secondary | ICD-10-CM

## 2023-12-11 DIAGNOSIS — E038 Other specified hypothyroidism: Secondary | ICD-10-CM

## 2023-12-11 DIAGNOSIS — E785 Hyperlipidemia, unspecified: Secondary | ICD-10-CM

## 2023-12-11 DIAGNOSIS — N3946 Mixed incontinence: Secondary | ICD-10-CM

## 2023-12-11 DIAGNOSIS — J45909 Unspecified asthma, uncomplicated: Secondary | ICD-10-CM

## 2023-12-11 MED ORDER — ONDANSETRON 4 MG PO TBDP
4.0000 mg | ORAL_TABLET | Freq: Three times a day (TID) | ORAL | 0 refills | Status: AC | PRN
Start: 2023-12-11 — End: ?

## 2023-12-11 MED ORDER — ALBUTEROL SULFATE HFA 108 (90 BASE) MCG/ACT IN AERS: 1.0000 | INHALATION_SPRAY | Freq: Four times a day (QID) | RESPIRATORY_TRACT | 1 refills | Status: DC | PRN

## 2023-12-11 NOTE — Assessment & Plan Note (Signed)
 Pending urinalysis. The patient is encouraged to continue follow-up with Dr. Lamar Hollingshead. We will consider starting Myrbetriq 25 mg daily to help manage her urinary incontinence. An antibiotic will be prescribed if urinalysis results indicate a urinary tract infection (UTI).

## 2023-12-11 NOTE — Assessment & Plan Note (Signed)
 Refilled ondansetron.

## 2023-12-11 NOTE — Progress Notes (Signed)
 I called and spoke with the patient to inform her of her urinalysis findings, which showed trace leukocytes. We will await the results of the urine culture before initiating antibiotic therapy to confirm the diagnosis.  I also discussed that Myrbetriq is not the preferred medication under her insurance plan, and that the alternative preferred medications are listed on the Beers Criteria as potentially inappropriate for use in older adults.  A referral to urology has been placed. The patient is aware and consents to the referral.  All questions were answered, and the patient verbalized understanding of the plan.

## 2023-12-11 NOTE — Progress Notes (Signed)
 Established Patient Office Visit  Subjective:  Patient ID: [name], female    DOB: [birth year]  Age: 89 y.o. MRN: [id number]  CC:  Chief Complaint  Patient presents with   Medical Management of Chronic Issues    3 month follow up    HPI [name] is a 89 y.o. female with past medical history of  IBS with diarrhea, urinary incontinence presents for f/u of  chronic medical conditions.  IBS with Diarrhea: The patient reports a follow-up appointment with Dr. Lamar Hollingshead in two weeks. She is currently taking Imodium twice daily, which provides some relief for diarrhea. She notes mucus in the stool but denies blood. Her bowel patterns fluctuate from loose stools up to three times weekly to normal, then back to loose. She has been avoiding spicy foods.  The patient continues to experience lower abdominal pain and increased urinary frequency. She has a history of urinary incontinence and has previously undergone electrical stimulation therapy to the bladder with minimal relief. She denies burning with urination or fever but reports using up to three undergarment pads daily due to incontinence.  Past Medical History:  Diagnosis Date   Ankle swelling    takes torsemide  prn   Anxiety    Arthritis    Asthma    allergy induced   Back pain    Complication of anesthesia    COVID 2023   Depression    GERD (gastroesophageal reflux disease)    HOH (hard of hearing)    Hypercholesterolemia    Hypothyroid    Parkinson disease (HCC)    PONV (postoperative nausea and vomiting)    Seasonal allergies     Past Surgical History:  Procedure Laterality Date   ABDOMINAL HYSTERECTOMY     CARPAL TUNNEL RELEASE Right 10/26/2015   Procedure: CARPAL TUNNEL RELEASE;  Surgeon: Arley Curia, MD;  Location: [facility];  Service: Orthopedics;  Laterality: Right;   CARPOMETACARPEL SUSPENSION PLASTY Right 10/26/2015   Procedure: RIGHT HAND TRAPEZIECTOMY WITH THUMB METACARPEL SUSPENSION  PLASTY;  Surgeon: Arley Curia, MD;  Location: [facility];  Service: Orthopedics;  Laterality: Right;   CATARACT EXTRACTION W/PHACO  12/23/2011   Procedure: CATARACT EXTRACTION PHACO AND INTRAOCULAR LENS PLACEMENT (IOC);  Surgeon: Dow JULIANNA Burke, MD;  Location: [facility];  Service: Ophthalmology;  Laterality: Right;  CDE=5.16   CHOLECYSTECTOMY     EYE SURGERY     cataract extraction of left eye-[facility]-2002   KNEE ARTHROSCOPY     right   NISSEN FUNDOPLICATION     [facility]   TRIGGER FINGER RELEASE Right 10/26/2015   Procedure: RELEASE A-1 PULLEY RIGHT RING FINGER;  Surgeon: Arley Curia, MD;  Location: [facility];  Service: Orthopedics;  Laterality: Right;    Family History  Problem Relation Age of Onset   Stroke Mother    COPD Father    Stroke Father    Colon cancer Neg Hx     Social History   Socioeconomic History   Marital status: Widowed    Spouse name: Not on file   Number of children: 2   Years of education: Not on file   Highest education level: 12th grade  Occupational History    Comment: retired  Tobacco Use   Smoking status: Never    Passive exposure: Never   Smokeless tobacco: Never  Vaping Use   Vaping status: Never Used  Substance and Sexual Activity   Alcohol use: Yes    Alcohol/week:  1.0 standard drink of alcohol    Types: 1 Glasses of wine per week    Comment: occ   Drug use: No   Sexual activity: Not Currently    Birth control/protection: Surgical  Other Topics Concern   Not on file  Social History Narrative   ** Merged History Encounter ** Patient lives at home alone and she is widowed.   Retired.    Education one year business course.   Right handed.   Caffeine two cup of coffee daily and 4 cups of sweet tea   Social Drivers of Corporate investment banker Strain: Low Risk  (09/07/2023)   Overall Financial Resource Strain (CARDIA)    Difficulty of Paying Living Expenses: Not hard at all  Food Insecurity: No Food Insecurity  (09/07/2023)   Hunger Vital Sign    Worried About Running Out of Food in the Last Year: Never true    Ran Out of Food in the Last Year: Never true  Transportation Needs: No Transportation Needs (09/07/2023)   PRAPARE - Administrator, Civil Service (Medical): No    Lack of Transportation (Non-Medical): No  Physical Activity: Insufficiently Active (09/07/2023)   Exercise Vital Sign    Days of Exercise per Week: 2 days    Minutes of Exercise per Session: 10 min  Stress: No Stress Concern Present (09/07/2023)   [employer] of Occupational Health - Occupational Stress Questionnaire    Feeling of Stress : Not at all  Social Connections: Moderately Integrated (09/07/2023)   Social Connection and Isolation Panel    Frequency of Communication with Friends and Family: More than three times a week    Frequency of Social Gatherings with Friends and Family: Twice a week    Attends Religious Services: More than 4 times per year    Active Member of [employer] or Organizations: Yes    Attends Banker Meetings: More than 4 times per year    Marital Status: Widowed  Intimate Partner Violence: Not At Risk (03/04/2023)   Humiliation, Afraid, Rape, and Kick questionnaire    Fear of Current or Ex-Partner: No    Emotionally Abused: No    Physically Abused: No    Sexually Abused: No    Outpatient Medications Prior to Visit  Medication Sig Dispense Refill   acetaminophen  (TYLENOL ) 325 MG tablet Take 2 tablets (650 mg total) by mouth every 6 (six) hours as needed for moderate pain. 30 tablet 0   carbidopa -levodopa  (SINEMET  IR) 25-100 MG tablet Take 1 tablet by mouth 4 (four) times daily. 360 tablet 2   chlorhexidine  (HIBICLENS ) 4 % external liquid Apply topically daily as needed. 236 mL 0   escitalopram (LEXAPRO) 10 MG tablet Take 10 mg by mouth daily.     famotidine  (PEPCID ) 20 MG tablet Take one dose before evening meal each day, may take second dose if needed. No more than two per  day. 60 tablet 5   fluticasone  (FLONASE ) 50 MCG/ACT nasal spray Place 2 sprays into both nostrils daily. 16 g 0   fluticasone -salmeterol (ADVAIR HFA) 230-21 MCG/ACT inhaler Inhale 2 puffs into the lungs 2 (two) times daily. 1 each 12   gabapentin  (NEURONTIN ) 100 MG capsule TAKE 3 CAPSULES BY MOUTH 3 TIMES DAILY 810 capsule 3   guaiFENesin  (ROBITUSSIN) 100 MG/5ML liquid Take 5 mLs by mouth every 4 (four) hours as needed for cough or to loosen phlegm. 120 mL 0   ketoconazole  (NIZORAL ) 2 % cream Apply 1  application topically daily. 30 g 0   levocetirizine (XYZAL ) 5 MG tablet Take 1 tablet (5 mg total) by mouth every evening. 60 tablet 1   levothyroxine  (SYNTHROID , LEVOTHROID) 25 MCG tablet Take 25 mcg by mouth daily.     Lido-Capsaicin-Men-Methyl Sal (MEDI-PATCH-LIDOCAINE ) 0.5-0.035-5-20 % PTCH Apply once a day to neuralgia area, right abdomen and below the rib cage. 30 patch 1   lidocaine -prilocaine  (EMLA ) cream Apply 1 Application topically as needed. 30 g 5   metroNIDAZOLE  (FLAGYL ) 500 MG tablet Take 1 tablet (500 mg total) by mouth 2 (two) times daily. 14 tablet 0   Multiple Vitamins-Minerals (ZINC PO) Take by mouth daily.     mupirocin  ointment (BACTROBAN ) 2 % Apply 1 Application topically 2 (two) times daily. 22 g 0   potassium chloride (MICRO-K) 10 MEQ CR capsule Take 20 mEq by mouth daily.      torsemide  (DEMADEX ) 10 MG tablet Take 10 mg by mouth daily. 1/2 to 1 tab daily     Vitamin D , Ergocalciferol , (DRISDOL ) 1.25 MG (50000 UNIT) CAPS capsule Take 1 capsule (50,000 Units total) by mouth every 7 (seven) days. 20 capsule 1   zinc oxide (BALMEX ADULT CARE) 11.3 % CREA cream Apply 1 Application topically 2 (two) times daily. Apply twice a day to the  edematous legs 120 g 1   albuterol  (VENTOLIN  HFA) 108 (90 Base) MCG/ACT inhaler Inhale 1-2 puffs into the lungs every 6 (six) hours as needed for wheezing or shortness of breath. 18 g 0   ondansetron  (ZOFRAN -ODT) 4 MG disintegrating tablet Take 1  tablet (4 mg total) by mouth every 8 (eight) hours as needed for nausea or vomiting. 20 tablet 0   No facility-administered medications prior to visit.    Allergies  Allergen Reactions   Cephalosporins Rash   Latex Rash    Breaks out then gets infected   Lactose Intolerance (Gi) Nausea And Vomiting   Lactulose Nausea And Vomiting   Cephalexin Rash   Levofloxacin Anxiety and Rash   Meperidine  Nausea And Vomiting    Needed Phenergan  to combat nausea.   Meperidine  And Related Nausea And Vomiting    Needed Phenergan  to combat nausea.   Meperidine  Hcl Nausea And Vomiting    Needed Phenergan  to combat nausea.   Omeprazole Rash    ROS Review of Systems  Constitutional:  Negative for chills and fever.  Eyes:  Negative for visual disturbance.  Respiratory:  Negative for chest tightness and shortness of breath.   Gastrointestinal:  Positive for abdominal pain and diarrhea.  Neurological:  Negative for dizziness and headaches.      Objective:    Physical Exam HENT:     Head: Normocephalic.     Mouth/Throat:     Mouth: Mucous membranes are moist.  Cardiovascular:     Rate and Rhythm: Normal rate.     Heart sounds: Normal heart sounds.  Pulmonary:     Effort: Pulmonary effort is normal.     Breath sounds: Normal breath sounds.  Abdominal:     Tenderness: There is abdominal tenderness.  Neurological:     Mental Status: She is alert.     BP 138/69   Pulse 95   Ht 5' (1.524 m)   Wt 167 lb 1.3 oz (75.8 kg)   SpO2 93%   BMI 32.63 kg/m  Wt Readings from Last 3 Encounters:  12/11/23 167 lb 1.3 oz (75.8 kg)  11/07/23 164 lb 14.5 oz (74.8 kg)  10/29/23 165 lb (  74.8 kg)    Lab Results  Component Value Date   TSH 3.630 12/23/2022   Lab Results  Component Value Date   WBC 8.6 11/07/2023   HGB 12.8 11/07/2023   HCT 39.2 11/07/2023   MCV 95.4 11/07/2023   PLT 187 11/07/2023   Lab Results  Component Value Date   NA 137 11/07/2023   K 4.4 11/07/2023   CO2 25  11/07/2023   GLUCOSE 119 (H) 11/07/2023   BUN 12 11/07/2023   CREATININE 0.94 11/07/2023   BILITOT 0.8 11/07/2023   ALKPHOS 68 11/07/2023   AST 17 11/07/2023   ALT 7 11/07/2023   PROT 7.1 11/07/2023   ALBUMIN 3.9 11/07/2023   CALCIUM  8.8 (L) 11/07/2023   ANIONGAP 11 11/07/2023   EGFR 69 12/23/2022   Lab Results  Component Value Date   CHOL 180 12/23/2022   Lab Results  Component Value Date   HDL 54 12/23/2022   Lab Results  Component Value Date   LDLCALC 110 (H) 12/23/2022   Lab Results  Component Value Date   TRIG 85 12/23/2022   Lab Results  Component Value Date   CHOLHDL 3.3 12/23/2022   Lab Results  Component Value Date   HGBA1C 6.2 (H) 12/23/2022      Assessment & Plan:  Irritable bowel syndrome with diarrhea  Lower abdominal pain Assessment & Plan: Pending urinalysis. The patient is encouraged to continue follow-up with Dr. Lamar Hollingshead. We will consider starting Myrbetriq 25 mg daily to help manage her urinary incontinence. An antibiotic will be prescribed if urinalysis results indicate a urinary tract infection (UTI).     Orders: -     Urinalysis -     Urine Culture  Nausea and vomiting, unspecified vomiting type Assessment & Plan: Refilled ondansetron    Orders: -     Ondansetron ; Take 1 tablet (4 mg total) by mouth every 8 (eight) hours as needed for nausea or vomiting.  Dispense: 20 tablet; Refill: 0  Other fatigue -     B12 and Folate Panel  Asthma due to seasonal allergies -     Albuterol  Sulfate HFA; Inhale 1-2 puffs into the lungs every 6 (six) hours as needed for wheezing or shortness of breath.  Dispense: 18 g; Refill: 1  IFG (impaired fasting glucose) -     Hemoglobin A1c  Vitamin D  deficiency -     VITAMIN D  25 Hydroxy (Vit-D Deficiency, Fractures)  TSH (thyroid -stimulating hormone deficiency) -     TSH + free T4  Hyperlipidemia associated with type 2 diabetes mellitus (HCC) -     Lipid panel -     CMP14+EGFR -      CBC with Differential/Platelet  Note: This chart has been completed using Engineer, civil (consulting) software, and while attempts have been made to ensure accuracy, certain words and phrases may not be transcribed as intended.    Follow-up: Return in about 4 months (around 04/12/2024).   Breyon Blass, FNP

## 2023-12-11 NOTE — Patient Instructions (Addendum)
 I appreciate the opportunity to provide care to you today!    Follow up:  4 months  Labs: please stop by the lab during the week to get your blood drawn (CBC, CMP, TSH, Lipid profile, HgA1c, Vit D)  For a Healthier YOU, I Recommend: Reducing your intake of sugar, sodium, carbohydrates, and saturated fats. Increasing your fiber intake by incorporating more whole grains, fruits, and vegetables into your meals. Setting healthy goals with a focus on lowering your consumption of carbs, sugar, and unhealthy fats. Adding variety to your diet by including a wide range of fruits and vegetables. Cutting back on soda and limiting processed foods as much as possible. Staying active: In addition to taking your weight loss medication, aim for at least 150 minutes of moderate-intensity physical activity each week for optimal results.    Please follow up if your symptoms worsen or fail to improve.   Please continue to a heart-healthy diet and increase your physical activities. Try to exercise for at least five days a week.    It was a pleasure to see you and I look forward to continuing to work together on your health and well-being. Please do not hesitate to call the office if you need care or have questions about your care.  In case of emergency, please visit the Emergency Department for urgent care, or contact our clinic at [phone] to schedule an appointment. We're here to help you!   Have a wonderful day and week. With Gratitude, Jalei Shibley MSN, FNP-BC

## 2023-12-12 ENCOUNTER — Ambulatory Visit: Payer: Self-pay | Admitting: Family Medicine

## 2023-12-12 ENCOUNTER — Other Ambulatory Visit: Payer: Self-pay | Admitting: Family Medicine

## 2023-12-12 DIAGNOSIS — E559 Vitamin D deficiency, unspecified: Secondary | ICD-10-CM

## 2023-12-12 LAB — CBC WITH DIFFERENTIAL/PLATELET
Basophils Absolute: 0 x10E3/uL (ref 0.0–0.2)
Basos: 0 %
EOS (ABSOLUTE): 0 x10E3/uL (ref 0.0–0.4)
Eos: 1 %
Hematocrit: 39 % (ref 34.0–46.6)
Hemoglobin: 12.5 g/dL (ref 11.1–15.9)
Immature Grans (Abs): 0.1 x10E3/uL (ref 0.0–0.1)
Immature Granulocytes: 1 %
Lymphocytes Absolute: 1.9 x10E3/uL (ref 0.7–3.1)
Lymphs: 37 %
MCH: 30.1 pg (ref 26.6–33.0)
MCHC: 32.1 g/dL (ref 31.5–35.7)
MCV: 94 fL (ref 79–97)
Monocytes Absolute: 0.9 x10E3/uL (ref 0.1–0.9)
Monocytes: 18 %
Neutrophils Absolute: 2.3 x10E3/uL (ref 1.4–7.0)
Neutrophils: 43 %
Platelets: 133 x10E3/uL — ABNORMAL LOW (ref 150–450)
RBC: 4.15 x10E6/uL (ref 3.77–5.28)
RDW: 13.2 % (ref 11.7–15.4)
WBC: 5.2 x10E3/uL (ref 3.4–10.8)

## 2023-12-12 LAB — CMP14+EGFR
ALT: 6 IU/L (ref 0–32)
AST: 15 IU/L (ref 0–40)
Albumin: 4.4 g/dL (ref 3.7–4.7)
Alkaline Phosphatase: 86 IU/L (ref 44–121)
BUN/Creatinine Ratio: 15 (ref 12–28)
BUN: 13 mg/dL (ref 8–27)
Bilirubin Total: 0.4 mg/dL (ref 0.0–1.2)
CO2: 21 mmol/L (ref 20–29)
Calcium: 9.2 mg/dL (ref 8.7–10.3)
Chloride: 107 mmol/L — ABNORMAL HIGH (ref 96–106)
Creatinine, Ser: 0.87 mg/dL (ref 0.57–1.00)
Globulin, Total: 2.2 g/dL (ref 1.5–4.5)
Glucose: 126 mg/dL — ABNORMAL HIGH (ref 70–99)
Potassium: 4.8 mmol/L (ref 3.5–5.2)
Sodium: 143 mmol/L (ref 134–144)
Total Protein: 6.6 g/dL (ref 6.0–8.5)
eGFR: 64 mL/min/1.73 (ref >59)

## 2023-12-12 LAB — VITAMIN D 25 HYDROXY (VIT D DEFICIENCY, FRACTURES): Vit D, 25-Hydroxy: 27.2 ng/mL — ABNORMAL LOW (ref 30.0–100.0)

## 2023-12-12 LAB — HEMOGLOBIN A1C
Est. average glucose Bld gHb Est-mCnc: 123 mg/dL
Hgb A1c MFr Bld: 5.9 % — ABNORMAL HIGH (ref 4.8–5.6)

## 2023-12-12 LAB — LIPID PANEL
Chol/HDL Ratio: 3.5 ratio (ref 0.0–4.4)
Cholesterol, Total: 180 mg/dL (ref 100–199)
HDL: 52 mg/dL (ref >39)
LDL Chol Calc (NIH): 112 mg/dL — ABNORMAL HIGH (ref 0–99)
Triglycerides: 87 mg/dL (ref 0–149)
VLDL Cholesterol Cal: 16 mg/dL (ref 5–40)

## 2023-12-12 LAB — TSH+FREE T4
Free T4: 0.9 ng/dL (ref 0.82–1.77)
TSH: 3.14 u[IU]/mL (ref 0.450–4.500)

## 2023-12-12 LAB — B12 AND FOLATE PANEL
Folate: 10.3 ng/mL (ref >3.0)
Vitamin B-12: 1147 pg/mL (ref 232–1245)

## 2023-12-12 MED ORDER — VITAMIN D (ERGOCALCIFEROL) 1.25 MG (50000 UNIT) PO CAPS
50000.0000 [IU] | ORAL_CAPSULE | ORAL | 1 refills | Status: DC
Start: 2023-12-12 — End: 2023-12-15

## 2023-12-12 NOTE — Progress Notes (Signed)
 Please inform the patient that a prescription for a weekly vitamin D  supplement has been sent to her pharmacy, as her vitamin D  level is slightly low.  She should take the  medications as directed and continue all current treatment regimens as prescribed.

## 2023-12-15 ENCOUNTER — Other Ambulatory Visit: Payer: Self-pay | Admitting: Family Medicine

## 2023-12-15 DIAGNOSIS — E559 Vitamin D deficiency, unspecified: Secondary | ICD-10-CM

## 2023-12-15 LAB — URINALYSIS
Bilirubin, UA: NEGATIVE
Glucose, UA: NEGATIVE
Ketones, UA: NEGATIVE
Nitrite, UA: NEGATIVE
Protein,UA: NEGATIVE
Specific Gravity, UA: 1.015 (ref 1.005–1.030)
Urobilinogen, Ur: 0.2 mg/dL (ref 0.2–1.0)
pH, UA: 5.5 (ref 5.0–7.5)

## 2023-12-15 MED ORDER — VITAMIN D (ERGOCALCIFEROL) 1.25 MG (50000 UNIT) PO CAPS: 50000.0000 [IU] | ORAL_CAPSULE | ORAL | 1 refills | Status: AC

## 2023-12-16 LAB — URINE CULTURE

## 2023-12-19 ENCOUNTER — Other Ambulatory Visit: Payer: Self-pay | Admitting: Family Medicine

## 2023-12-19 DIAGNOSIS — N3000 Acute cystitis without hematuria: Secondary | ICD-10-CM

## 2023-12-19 MED ORDER — SULFAMETHOXAZOLE-TRIMETHOPRIM 800-160 MG PO TABS: 1.0000 | ORAL_TABLET | Freq: Two times a day (BID) | ORAL | 0 refills | Status: AC

## 2023-12-22 ENCOUNTER — Encounter: Payer: Self-pay | Admitting: Gastroenterology

## 2023-12-22 ENCOUNTER — Ambulatory Visit: Admitting: Gastroenterology

## 2023-12-22 ENCOUNTER — Telehealth: Payer: Self-pay | Admitting: *Deleted

## 2023-12-22 VITALS — BP 136/76 | HR 90 | Temp 98.4°F | Ht 60.0 in | Wt 165.6 lb

## 2023-12-22 DIAGNOSIS — K580 Irritable bowel syndrome with diarrhea: Secondary | ICD-10-CM | POA: Diagnosis not present

## 2023-12-22 DIAGNOSIS — K219 Gastro-esophageal reflux disease without esophagitis: Secondary | ICD-10-CM

## 2023-12-22 DIAGNOSIS — R9389 Abnormal findings on diagnostic imaging of other specified body structures: Secondary | ICD-10-CM

## 2023-12-22 DIAGNOSIS — R1032 Left lower quadrant pain: Secondary | ICD-10-CM | POA: Insufficient documentation

## 2023-12-22 DIAGNOSIS — R1031 Right lower quadrant pain: Secondary | ICD-10-CM | POA: Insufficient documentation

## 2023-12-22 DIAGNOSIS — R197 Diarrhea, unspecified: Secondary | ICD-10-CM

## 2023-12-22 MED ORDER — LANSOPRAZOLE 30 MG PO CPDR: 30.0000 mg | DELAYED_RELEASE_CAPSULE | Freq: Every day | ORAL | 1 refills | Status: DC

## 2023-12-22 NOTE — Progress Notes (Signed)
 GI Office Note    Referring Provider: Zarwolo, Gloria, FNP Primary Care Physician:  Zarwolo, Gloria, FNP  Primary Gastroenterologist: Ozell Hollingshead, MD   Chief Complaint   Chief Complaint  Patient presents with   Follow-up    Follow up ED visit. Pt states she is still having diarrhea with nausea. GERD no better    History of Present Illness   [name] is a 89 y.o. female presenting today for follow up. Last seen in office in 06/2021. She has h/o IBS, GERD, previous Nissen fundoplication at [facility] in [facility]. Last colonoscopy 2001 with no future screenings recommended due to age. Upper GI series 2020 showing diffuse age-related esophageal dysmotility with poor clearance for primary peristaltic waves, typical extrinsic compression of the GE junction and distal esophagus from an intact fundoplication wrap not identified during exam but no hiatal hernia or GERD noted.   Patient previously did not tolerate pantoprazole  (difficulty urinating) or Aciphex  (ruined me, caused urgency/fecal incontinence). Rash with omeprazole.  She notes reflux is not well controlled on pepcid . Takes it once daily. Having symptoms daily. Wants to try another PPI.   She was seen in ED 10/2023 for abdominal pain in lower abdomen associated with N/V/D, atypical from her normal IBS symptoms. En route to urgent care she had mechanical fall hitting back of her head so she went to ED. CT head/neck completed. She had mild soft tissue swelling/extracranial hematoma overlying left posterior vertex. CT of A/P completed and suspicious for diverticulitis. She was treated with augmentin and metronidazole . Patient notes she was advised to follow up with gyn but she does not have one.    Today she complains of ongoing abdominal pain in both LLQ/RLQ. She continues to have fecal urgency and loose stools. Symptoms associated with nausea but rare vomiting at this time. Having intermittent diarrhea, typically uses imodium 2mg  BID  and/or Pepto and then may have few days of normal stools. No constipation. Rarely skips a day without a stool. She has had some small volume brbpr on the toilet tissue, some this morning. She believes this is from irritation. No melena.   Patient notes that her stools and abdominal pain has been worse over the past one year.  Recent positive urine culture (Klebsiella pneumoniae). She was unaware of need for antibiotic. Bactrim  sent to her pharmacy ([facility]) late Friday night. Assured her she will likely get call from PCP office today but she should go ahead and pick up antibiotic and start today.    CT A/P with contrast 10/2023: IMPRESSION: 1. Mild acute sigmoid diverticulitis. No drainable fluid collection/abscess. No free air. 2. 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 Given patient's age, consider GYN evaluation as clinically warranted.     Medications   Current Outpatient Medications  Medication Sig Dispense Refill   acetaminophen  (TYLENOL ) 325 MG tablet Take 2 tablets (650 mg total) by mouth every 6 (six) hours as needed for moderate pain. 30 tablet 0   albuterol  (VENTOLIN  HFA) 108 (90 Base) MCG/ACT inhaler Inhale 1-2 puffs into the lungs every 6 (six) hours as needed for wheezing or shortness of breath. 18 g 1   carbidopa -levodopa  (SINEMET  IR) 25-100 MG tablet Take 1 tablet by mouth 4 (four) times daily. (Patient taking differently: Take 1 tablet by mouth in the morning and at bedtime.) 360 tablet 2   chlorhexidine  (HIBICLENS ) 4 % external liquid Apply topically daily as needed. 236 mL 0  famotidine  (PEPCID ) 20 MG tablet Take one dose before evening meal each day, may take second dose if needed. No more than two per day. 60 tablet 5   fluticasone  (FLONASE ) 50 MCG/ACT nasal spray Place 2 sprays into both nostrils daily. 16 g 0   fluticasone -salmeterol (ADVAIR HFA) 230-21 MCG/ACT inhaler Inhale 2 puffs into the lungs 2 (two) times  daily. 1 each 12   gabapentin  (NEURONTIN ) 100 MG capsule TAKE 3 CAPSULES BY MOUTH 3 TIMES DAILY 810 capsule 3   guaiFENesin  (ROBITUSSIN) 100 MG/5ML liquid Take 5 mLs by mouth every 4 (four) hours as needed for cough or to loosen phlegm. 120 mL 0   ketoconazole  (NIZORAL ) 2 % cream Apply 1 application topically daily. 30 g 0   levocetirizine (XYZAL ) 5 MG tablet Take 1 tablet (5 mg total) by mouth every evening. 60 tablet 1   levothyroxine  (SYNTHROID , LEVOTHROID) 25 MCG tablet Take 25 mcg by mouth daily.     Lido-Capsaicin-Men-Methyl Sal (MEDI-PATCH-LIDOCAINE ) 0.5-0.035-5-20 % PTCH Apply once a day to neuralgia area, right abdomen and below the rib cage. 30 patch 1   lidocaine -prilocaine  (EMLA ) cream Apply 1 Application topically as needed. 30 g 5   mupirocin  ointment (BACTROBAN ) 2 % Apply 1 Application topically 2 (two) times daily. 22 g 0   ondansetron  (ZOFRAN -ODT) 4 MG disintegrating tablet Take 1 tablet (4 mg total) by mouth every 8 (eight) hours as needed for nausea or vomiting. 20 tablet 0   potassium chloride (MICRO-K) 10 MEQ CR capsule Take 20 mEq by mouth daily.      sulfamethoxazole -trimethoprim  (BACTRIM  DS) 800-160 MG tablet Take 1 tablet by mouth 2 (two) times daily for 5 days. 10 tablet 0   torsemide  (DEMADEX ) 10 MG tablet Take 10 mg by mouth daily. 1/2 to 1 tab daily     Vitamin D , Ergocalciferol , (DRISDOL ) 1.25 MG (50000 UNIT) CAPS capsule Take 1 capsule (50,000 Units total) by mouth every 7 (seven) days. 27 capsule 1   zinc oxide (BALMEX ADULT CARE) 11.3 % CREA cream Apply 1 Application topically 2 (two) times daily. Apply twice a day to the  edematous legs 120 g 1   escitalopram (LEXAPRO) 10 MG tablet Take 10 mg by mouth daily. (Patient not taking: Reported on 12/22/2023)     No current facility-administered medications for this visit.    Allergies   Allergies as of 12/22/2023 - Review Complete 12/22/2023  Allergen Reaction Noted   Cephalosporins Rash 10/19/2010   Latex Rash  01/17/2011   Lactose intolerance (gi) Nausea And Vomiting 12/09/2011   Lactulose Nausea And Vomiting 05/15/2015   Cephalexin Rash 06/15/2015   Levofloxacin Anxiety and Rash 10/08/2018   Meperidine  Nausea And Vomiting 10/19/2010   Meperidine  and related Nausea And Vomiting 10/19/2010   Meperidine  hcl Nausea And Vomiting 05/15/2015   Omeprazole Rash 06/15/2015       Review of Systems   General: Negative for anorexia, weight loss, fever, chills, fatigue, weakness. Generally does not feel well. ENT: Negative for hoarseness, difficulty swallowing , nasal congestion. CV: Negative for chest pain, angina, palpitations, dyspnea on exertion, peripheral edema.  Respiratory: Negative for dyspnea at rest, dyspnea on exertion, cough, sputum, wheezing.  GI: See history of present illness. GU:  Negative for  hematuria, urinary incontinence, urinary frequency, nocturnal urination. +dysuria Endo: Negative for unusual weight change.     Physical Exam   BP 136/76   Pulse 90   Temp 98.4 F (36.9 C)   Ht 5' (1.524 m)  Wt 165 lb 9.6 oz (75.1 kg)   BMI 32.34 kg/m    General: Well-nourished, well-developed in no acute distress.  Eyes: No icterus. Mouth: Oropharyngeal mucosa moist and pink   Lungs: Clear to auscultation bilaterally.  Heart: Regular rate and rhythm, no murmurs rubs or gallops.  Abdomen: Bowel sounds are normal,  , nondistended, no hepatosplenomegaly or masses,  no abdominal bruits or hernia , no rebound or guarding. Mild lower abdominal pain in LLQ and RLQ Rectal: not performed Extremities: No lower extremity edema. No clubbing or deformities. Neuro: Alert and oriented x 4   Skin: Warm and dry, no jaundice.   Psych: Alert and cooperative, normal mood and affect.  Labs   Lab Results  Component Value Date   NA 143 12/11/2023   CL 107 (H) 12/11/2023   K 4.8 12/11/2023   CO2 21 12/11/2023   BUN 13 12/11/2023   CREATININE 0.87 12/11/2023   EGFR 64 12/11/2023   CALCIUM  9.2  12/11/2023   ALBUMIN 4.4 12/11/2023   GLUCOSE 126 (H) 12/11/2023   Lab Results  Component Value Date   ALT 6 12/11/2023   AST 15 12/11/2023   ALKPHOS 86 12/11/2023   BILITOT 0.4 12/11/2023   Lab Results  Component Value Date   WBC 5.2 12/11/2023   HGB 12.5 12/11/2023   HCT 39.0 12/11/2023   MCV 94 12/11/2023   PLT 133 (L) 12/11/2023   Lab Results  Component Value Date   TSH 3.140 12/11/2023   Lab Results  Component Value Date   VITAMINB12 1,147 12/11/2023   Lab Results  Component Value Date   FOLATE 10.3 12/11/2023   No results found for: IRON, TIBC, FERRITIN  Imaging Studies   No results found.  Assessment/Plan:   Abdominal pain, diarrhea, nausea: baseline IBS but change in symptoms overall in past one year with recent acute worsening of symptoms with suggestion of diverticulitis on CT in May. Did not improve with antibiotics.  -given persistent symptoms, recommend repeat CT A/P with contrast to rule out complicated diverituculitis -use imodium sparingly, 2mg  daily but no more than bid -may require stool studies but currently needs antibiotics for UTI, Klebsiella pneumoniae. Would hold off on stool studies until off antibiotics unless symptoms worsen.   GERD: poorly controlled on Pepcid , history of intolerances to pantoprazole  and aciphex , rash to omeprazole. Patient desires trying another PPI.  -lansoprazole  30mg  daily before breakfast -monitor for side effects, stop if any issues  Right adnexal mass: -noted on CT 10/2023 -gyn referral    Sonny RAMAN. Ezzard, MHS, PA-C [facility]

## 2023-12-22 NOTE — Patient Instructions (Signed)
 We will arrange for CT scan to follow up on diverticulitis given your persistent pain.  We will refer you to gynecology for right adnexal abnormality seen on CT.  Try lansoprazole  30mg  daily before breakfast for acid reflux. Monitor for rash or other side effects. If any concerns, stop medication and let me know. You can continue pepcid  up to twice daily if needed.  Use imodium 2mg  once to twice daily as needed for diarrhea, use lowest dose that makes your symptoms manageable.   Please start antibiotics for UTI as prescribed by your PCP.

## 2023-12-22 NOTE — Telephone Encounter (Signed)
 [employer] PA for CT: CPT Code 25822 Description: CT ABDOMEN & PELVIS W/ Case Number: [id number] Review Date: 12/22/2023 1:33:35 PM Expiration Date: N/A Status: This member's benefit plan did not require a prior authorization for this request.

## 2023-12-25 ENCOUNTER — Ambulatory Visit (HOSPITAL_COMMUNITY)
Admission: RE | Admit: 2023-12-25 | Discharge: 2023-12-25 | Disposition: A | Discharge status: Home / Self Care | Source: Ambulatory Visit | Attending: Gastroenterology | Admitting: Gastroenterology

## 2023-12-25 DIAGNOSIS — R1032 Left lower quadrant pain: Secondary | ICD-10-CM | POA: Insufficient documentation

## 2023-12-25 DIAGNOSIS — K219 Gastro-esophageal reflux disease without esophagitis: Secondary | ICD-10-CM | POA: Diagnosis present

## 2023-12-25 DIAGNOSIS — R197 Diarrhea, unspecified: Secondary | ICD-10-CM | POA: Insufficient documentation

## 2023-12-25 DIAGNOSIS — R1031 Right lower quadrant pain: Secondary | ICD-10-CM | POA: Insufficient documentation

## 2023-12-25 MED ORDER — IOHEXOL 9 MG/ML PO SOLN
ORAL | Status: AC
  Filled 2023-12-25: qty 1000

## 2023-12-25 MED ORDER — IOHEXOL 300 MG/ML  SOLN
100.0000 mL | Freq: Once | INTRAMUSCULAR | Status: AC | PRN
  Administered 2023-12-25: 100 mL via INTRAVENOUS

## 2023-12-26 ENCOUNTER — Ambulatory Visit: Payer: Self-pay | Admitting: Gastroenterology

## 2023-12-30 ENCOUNTER — Other Ambulatory Visit: Payer: Self-pay | Admitting: Neurology

## 2024-01-05 ENCOUNTER — Encounter: Payer: Self-pay | Admitting: Obstetrics & Gynecology

## 2024-01-05 ENCOUNTER — Ambulatory Visit: Admitting: Obstetrics & Gynecology

## 2024-01-05 VITALS — BP 185/79 | HR 96 | Ht 60.0 in | Wt 169.6 lb

## 2024-01-05 DIAGNOSIS — N838 Other noninflammatory disorders of ovary, fallopian tube and broad ligament: Secondary | ICD-10-CM

## 2024-01-05 NOTE — Progress Notes (Signed)
 GYN VISIT Patient name: [name] MRN [id number]  Date of birth: 04/27/1934 Chief Complaint:   Ovarian mass  History of Present Illness:   [name] is a 89 y.o. PM, PH female being seen today for follow up regarding incidental finding.  Recent CT completed on 12/26/2023: No right adnexal mass with calcifications measuring 6.5 x 4.5 x 3.6 cm.  No significant change in appearance or size since prior study  Notes some abdominal discomfort, but this is along standing issue.  Some abdominal bloating.  Decreased appetite.  When IBS is acting up notes some nausea/vomiting.    Denies vaginal bleeding.  Reports no acute GYN concerns  No LMP recorded. Patient has had a hysterectomy.    Review of Systems:   Pertinent items are noted in HPI Denies fever/chills, dizziness, headaches, visual disturbances, fatigue, shortness of breath, chest pain Pertinent History Reviewed:   Past Surgical History:  Procedure Laterality Date   ABDOMINAL HYSTERECTOMY     CARPAL TUNNEL RELEASE Right 10/26/2015   Procedure: CARPAL TUNNEL RELEASE;  Surgeon: Arley Curia, MD;  Location: [facility];  Service: Orthopedics;  Laterality: Right;   CARPOMETACARPEL SUSPENSION PLASTY Right 10/26/2015   Procedure: RIGHT HAND TRAPEZIECTOMY WITH THUMB METACARPEL SUSPENSION PLASTY;  Surgeon: Arley Curia, MD;  Location: [facility];  Service: Orthopedics;  Laterality: Right;   CATARACT EXTRACTION W/PHACO  12/23/2011   Procedure: CATARACT EXTRACTION PHACO AND INTRAOCULAR LENS PLACEMENT (IOC);  Surgeon: Dow JULIANNA Burke, MD;  Location: [facility];  Service: Ophthalmology;  Laterality: Right;  CDE=5.16   CHOLECYSTECTOMY     EYE SURGERY     cataract extraction of left eye-[facility]-2002   KNEE ARTHROSCOPY     right   NISSEN FUNDOPLICATION     [facility]   TRIGGER FINGER RELEASE Right 10/26/2015   Procedure: RELEASE A-1 PULLEY RIGHT RING FINGER;  Surgeon: Arley Curia, MD;  Location: [facility];   Service: Orthopedics;  Laterality: Right;    Past Medical History:  Diagnosis Date   Ankle swelling    takes torsemide  prn   Anxiety    Arthritis    Asthma    allergy induced   Back pain    Complication of anesthesia    COVID 2023   Depression    GERD (gastroesophageal reflux disease)    HOH (hard of hearing)    Hypercholesterolemia    Hypothyroid    Parkinson disease (HCC)    PONV (postoperative nausea and vomiting)    Seasonal allergies    Reviewed problem list, medications and allergies. Physical Assessment:   Vitals:   01/05/24 1051  BP: (!) 185/79  Pulse: 96  Weight: 169 lb 9.6 oz (76.9 kg)  Height: 5' (1.524 m)  Body mass index is 33.12 kg/m.       Physical Examination:   General appearance: alert, well appearing, and in no distress  Psych: mood appropriate, normal affect  Skin: warm & dry   Cardiovascular: normal heart rate noted  Respiratory: normal respiratory effort, no distress  Abdomen: soft, non-tender, no rebound, no guarding  Pelvic: examination not indicated  Extremities: no edema   Chaperone: Dr. Damien Cassis    Assessment & Plan:  1) Ovarian mass - Reviewed findings on CT and discussed plan for pelvic ultrasound for further evaluation -Suspect this was an incidental finding and based on history, patient appears asymptomatic - Briefly discussed that based on current findings suspect benign - Neck step pending results of pelvic  ultrasound   Orders Placed This Encounter  Procedures   US  PELVIC COMPLETE WITH TRANSVAGINAL    Return for US  and visit post.   Lelynd Poer, DO Attending Obstetrician & Gynecologist, [facility], [facility]

## 2024-01-13 ENCOUNTER — Other Ambulatory Visit: Payer: Self-pay | Admitting: Obstetrics & Gynecology

## 2024-01-13 ENCOUNTER — Other Ambulatory Visit: Payer: Self-pay | Admitting: Gastroenterology

## 2024-01-13 ENCOUNTER — Ambulatory Visit

## 2024-01-13 DIAGNOSIS — N839 Noninflammatory disorder of ovary, fallopian tube and broad ligament, unspecified: Secondary | ICD-10-CM

## 2024-01-13 DIAGNOSIS — N838 Other noninflammatory disorders of ovary, fallopian tube and broad ligament: Secondary | ICD-10-CM

## 2024-01-13 NOTE — Progress Notes (Signed)
 PELVIC US  TA only, unable to insert vaginal probe,normal vaginal cuff,calcified right adnexal mass with cystic and solid components,posterior shadowing,avascular (limited T/A only),left ovary not visualized

## 2024-01-14 ENCOUNTER — Encounter: Payer: Self-pay | Admitting: Obstetrics & Gynecology

## 2024-01-14 ENCOUNTER — Ambulatory Visit: Admitting: Obstetrics & Gynecology

## 2024-01-14 VITALS — BP 146/78 | HR 86 | Wt 168.0 lb

## 2024-01-14 DIAGNOSIS — K582 Mixed irritable bowel syndrome: Secondary | ICD-10-CM | POA: Diagnosis not present

## 2024-01-14 DIAGNOSIS — N838 Other noninflammatory disorders of ovary, fallopian tube and broad ligament: Secondary | ICD-10-CM | POA: Diagnosis not present

## 2024-01-14 NOTE — Progress Notes (Signed)
 GYN VISIT Patient name: [name] MRN [id number]  Date of birth: 1934/05/28 Chief Complaint:   Follow-up (ultrasound)  History of Present Illness:   [name] is a 89 y.o. G3P2012 PM, PH female being seen today for ovarian mass follow up.  In review, patient was seen for an incidental right adnexal mass that was initially seen on CT back in July.  At that time the cyst measured approximately 6.5 x 4.5 x 3.6 cm with potential calcifications.  She denies acute abdominal pain and notes a h/o GI issues including some abdominal bloating decreased appetite and matches her symptoms of IBS  No LMP recorded. Patient has had a hysterectomy.    Review of Systems:   Pertinent items are noted in HPI Denies fever/chills, dizziness, headaches, visual disturbances, fatigue, shortness of breath, chest pain. Pertinent History Reviewed:   Past Surgical History:  Procedure Laterality Date   ABDOMINAL HYSTERECTOMY     CARPAL TUNNEL RELEASE Right 10/26/2015   Procedure: CARPAL TUNNEL RELEASE;  Surgeon: Arley Curia, MD;  Location: [facility];  Service: Orthopedics;  Laterality: Right;   CARPOMETACARPEL SUSPENSION PLASTY Right 10/26/2015   Procedure: RIGHT HAND TRAPEZIECTOMY WITH THUMB METACARPEL SUSPENSION PLASTY;  Surgeon: Arley Curia, MD;  Location: [facility];  Service: Orthopedics;  Laterality: Right;   CATARACT EXTRACTION W/PHACO  12/23/2011   Procedure: CATARACT EXTRACTION PHACO AND INTRAOCULAR LENS PLACEMENT (IOC);  Surgeon: Dow JULIANNA Burke, MD;  Location: [facility];  Service: Ophthalmology;  Laterality: Right;  CDE=5.16   CHOLECYSTECTOMY     EYE SURGERY     cataract extraction of left eye-[facility]-2002   KNEE ARTHROSCOPY     right   NISSEN FUNDOPLICATION     [facility]   TRIGGER FINGER RELEASE Right 10/26/2015   Procedure: RELEASE A-1 PULLEY RIGHT RING FINGER;  Surgeon: Arley Curia, MD;  Location: [facility];  Service: Orthopedics;  Laterality: Right;     Past Medical History:  Diagnosis Date   Ankle swelling    takes torsemide  prn   Anxiety    Arthritis    Asthma    allergy induced   Back pain    Complication of anesthesia    COVID 2023   Depression    GERD (gastroesophageal reflux disease)    HOH (hard of hearing)    Hypercholesterolemia    Hypothyroid    Parkinson disease (HCC)    PONV (postoperative nausea and vomiting)    Seasonal allergies    Reviewed problem list, medications and allergies. Physical Assessment:   Vitals:   01/14/24 1500 01/14/24 1508  BP: (!) 161/78 (!) 146/78  Pulse: 86   Weight: 168 lb (76.2 kg)   Body mass index is 32.81 kg/m.       Physical Examination:   General appearance: alert, well appearing, and in no distress  Psych: mood appropriate, normal affect  Skin: warm & dry   Cardiovascular: normal heart rate noted  Respiratory: normal respiratory effort, no distress  Pelvic: examination not indicated  Chaperone: N/A    Assessment & Plan:  1) Right adnexal mass - Reviewed recent ultrasound and discussed that findings are intermediate - Discussed that unfortunately at this time I cannot confirm whether this is or is not cancer.  Discussed potential management and whether or not that was something that she desired.  At this time she states she was not sure - Discussed further investigation such as MRI and/or ROMA.  Discussed that typically ovarian tumor  markers are used in the setting when surgical intervention is already planned.  After much discussion patient wishes to proceed with blood work today - Further management pending results   Orders Placed This Encounter  Procedures   CEA   Ovarian Malignancy Risk-ROMA   Cancer antigen 19-9    Return for TBD.   Verlon Carcione, DO Attending Obstetrician & Gynecologist, [facility] for [employer], [facility]

## 2024-01-15 LAB — CEA: CEA: 1.4 ng/mL (ref 0.0–4.7)

## 2024-01-15 LAB — OVARIAN MALIGNANCY RISK-ROMA
Cancer Antigen (CA) 125: 19.1 U/mL (ref 0.0–38.1)
HE4: 119 pmol/L — ABNORMAL HIGH (ref 0.0–96.9)
Postmenopausal ROMA: 2.77
Premenopausal ROMA: 3.91 — ABNORMAL HIGH

## 2024-01-15 LAB — PREMENOPAUSAL INTERP: HIGH

## 2024-01-15 LAB — POSTMENOPAUSAL INTERP: LOW

## 2024-01-15 LAB — CANCER ANTIGEN 19-9: CA 19-9: 8 U/mL (ref 0–35)

## 2024-01-19 ENCOUNTER — Ambulatory Visit: Payer: Self-pay | Admitting: Obstetrics & Gynecology

## 2024-01-19 ENCOUNTER — Other Ambulatory Visit: Payer: Self-pay | Admitting: Obstetrics & Gynecology

## 2024-01-19 DIAGNOSIS — N838 Other noninflammatory disorders of ovary, fallopian tube and broad ligament: Secondary | ICD-10-CM

## 2024-01-19 NOTE — Progress Notes (Signed)
 Order for follow-up ultrasound in 3 months  Joanne Castille, DO Attending Obstetrician & Gynecologist, [facility] for [employer], [facility]

## 2024-01-22 ENCOUNTER — Ambulatory Visit: Admitting: Urology

## 2024-01-24 ENCOUNTER — Other Ambulatory Visit: Payer: Self-pay | Admitting: Family Medicine

## 2024-01-24 DIAGNOSIS — J45909 Unspecified asthma, uncomplicated: Secondary | ICD-10-CM

## 2024-02-03 ENCOUNTER — Encounter: Admitting: Obstetrics & Gynecology

## 2024-02-23 ENCOUNTER — Encounter: Payer: Self-pay | Admitting: Urology

## 2024-02-23 ENCOUNTER — Ambulatory Visit: Admitting: Urology

## 2024-02-23 ENCOUNTER — Telehealth: Payer: Self-pay

## 2024-02-23 VITALS — BP 147/75 | HR 101

## 2024-02-23 DIAGNOSIS — N3946 Mixed incontinence: Secondary | ICD-10-CM | POA: Diagnosis not present

## 2024-02-23 DIAGNOSIS — R3129 Other microscopic hematuria: Secondary | ICD-10-CM

## 2024-02-23 DIAGNOSIS — R300 Dysuria: Secondary | ICD-10-CM

## 2024-02-23 LAB — URINALYSIS, ROUTINE W REFLEX MICROSCOPIC
Bilirubin, UA: NEGATIVE
Glucose, UA: NEGATIVE
Ketones, UA: NEGATIVE
Nitrite, UA: POSITIVE — AB
Protein,UA: NEGATIVE
Specific Gravity, UA: <1.005 — ABNORMAL LOW (ref 1.005–1.030)
Urobilinogen, Ur: 0.2 mg/dL (ref 0.2–1.0)
pH, UA: 6 (ref 5.0–7.5)

## 2024-02-23 LAB — MICROSCOPIC EXAMINATION: WBC, UA: >30 /HPF — AB (ref 0–5)

## 2024-02-23 MED ORDER — GEMTESA 75 MG PO TABS: 75.0000 mg | ORAL_TABLET | Freq: Every day | ORAL | 11 refills | Status: AC

## 2024-02-23 MED ORDER — ESTRADIOL 0.1 MG/GM VA CREA: 1.0000 g | TOPICAL_CREAM | VAGINAL | 1 refills | Status: DC

## 2024-02-23 NOTE — Telephone Encounter (Signed)
 Called pt let her know MD Eskridge sent in Rx pt voiced her understanding

## 2024-02-23 NOTE — Telephone Encounter (Signed)
-----   Message from [name] sent at 02/23/2024  4:28 PM EDT ----- Please let Arlean know her GYN Dr. Ozan was OK with her starting the vaginal estrogen, so I sent a prescription. Thanks! ----- Message ----- From: Ozan, Jennifer, DO Sent: 02/23/2024   1:57 PM EDT To: Joanne Brooks, MD  Absolutely, thank you! ----- Message ----- From: Torres Donnice, MD Sent: 02/23/2024  10:41 AM EDT To: Jennifer Ozan, DO  I saw [name] today and she has gential syndrome of menopause (GSM). Are you OK with me start vaginal estrogen based of the new GSM guideline? Just wanted to make sure given the ovarian mass. Thanks! Adina Torres  - urology.

## 2024-02-23 NOTE — Progress Notes (Unsigned)
 02/23/2024 10:10 AM   [name] 04-14-1934 [id number]  Referring provider: Zarwolo, Gloria, FNP [address] [place],  [place] [address]  No chief complaint on file.   HPI:  F/u -   1) incontinence - she leaks with some urgency and once starts loses it. Turn key and water running. She uses 3-4 depends. If she voids q 2 hours less wet. Stream OK. No GU Meds or surgery. She has occasional dysuria. No gross hematuria. She got 3-4 UTI. NG risk includes a movement disorder (like parkinson's).   Jul 2025 CT a/p - benign GU. R>L ERP. 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 Bladder not imaged. No breast cancer.   Today, seen for the above.   PMH: Past Medical History:  Diagnosis Date   Ankle swelling    takes torsemide  prn   Anxiety    Arthritis    Asthma    allergy induced   Back pain    Complication of anesthesia    COVID 2023   Depression    GERD (gastroesophageal reflux disease)    HOH (hard of hearing)    Hypercholesterolemia    Hypothyroid    Parkinson disease (HCC)    PONV (postoperative nausea and vomiting)    Seasonal allergies     Surgical History: Past Surgical History:  Procedure Laterality Date   ABDOMINAL HYSTERECTOMY     CARPAL TUNNEL RELEASE Right 10/26/2015   Procedure: CARPAL TUNNEL RELEASE;  Surgeon: Arley Curia, MD;  Location: [facility];  Service: Orthopedics;  Laterality: Right;   CARPOMETACARPEL SUSPENSION PLASTY Right 10/26/2015   Procedure: RIGHT HAND TRAPEZIECTOMY WITH THUMB METACARPEL SUSPENSION PLASTY;  Surgeon: Arley Curia, MD;  Location: [facility];  Service: Orthopedics;  Laterality: Right;   CATARACT EXTRACTION W/PHACO  12/23/2011   Procedure: CATARACT EXTRACTION PHACO AND INTRAOCULAR LENS PLACEMENT (IOC);  Surgeon: Dow JULIANNA Burke, MD;  Location: [facility];  Service:  Ophthalmology;  Laterality: Right;  CDE=5.16   CHOLECYSTECTOMY     EYE SURGERY     cataract extraction of left eye-[facility]-2002   KNEE ARTHROSCOPY     right   NISSEN FUNDOPLICATION     [facility]   TRIGGER FINGER RELEASE Right 10/26/2015   Procedure: RELEASE A-1 PULLEY RIGHT RING FINGER;  Surgeon: Arley Curia, MD;  Location: [facility];  Service: Orthopedics;  Laterality: Right;    Home Medications:  Allergies as of 02/23/2024       Reactions   Cephalosporins Rash   Latex Rash   Breaks out then gets infected   Lactose Intolerance (gi) Nausea And Vomiting   Lactulose Nausea And Vomiting   Cephalexin Rash   Levofloxacin Anxiety, Rash   Meperidine  Nausea And Vomiting   Needed Phenergan  to combat nausea.   Meperidine  And Related Nausea And Vomiting   Needed Phenergan  to combat nausea.   Meperidine  Hcl Nausea And Vomiting   Needed Phenergan  to combat nausea.   Omeprazole Rash        Medication List        Accurate as of February 23, 2024 10:10 AM. If you have any questions, ask your nurse or doctor.          acetaminophen  325 MG tablet Commonly known as: Tylenol  Take 2 tablets (650 mg total) by mouth every 6 (six) hours as needed for moderate pain.  albuterol  108 (90 Base) MCG/ACT inhaler Commonly known as: VENTOLIN  HFA USE 1 TO 2 INHALATIONS BY MOUTH  EVERY 6 HOURS AS NEEDED FOR  WHEEZING OR SHORTNESS OF BREATH   Balmex Adult Care 11.3 % Crea cream Generic drug: zinc oxide Apply 1 Application topically 2 (two) times daily. Apply twice a day to the  edematous legs   carbidopa -levodopa  25-100 MG tablet Commonly known as: SINEMET  IR Take 1 tablet by mouth 4 (four) times daily.   chlorhexidine  4 % external liquid Commonly known as: Hibiclens  Apply topically daily as needed.   escitalopram 10 MG tablet Commonly known as: LEXAPRO Take 10 mg by mouth daily.   famotidine  20 MG tablet Commonly known as: PEPCID  Take one dose before evening meal each day,  may take second dose if needed. No more than two per day.   fluticasone  50 MCG/ACT nasal spray Commonly known as: FLONASE  Place 2 sprays into both nostrils daily.   fluticasone -salmeterol 230-21 MCG/ACT inhaler Commonly known as: Advair HFA Inhale 2 puffs into the lungs 2 (two) times daily.   gabapentin  100 MG capsule Commonly known as: NEURONTIN  TAKE 3 CAPSULES BY MOUTH 3 TIMES DAILY   guaiFENesin  100 MG/5ML liquid Commonly known as: ROBITUSSIN Take 5 mLs by mouth every 4 (four) hours as needed for cough or to loosen phlegm.   ketoconazole  2 % cream Commonly known as: NIZORAL  Apply 1 application topically daily.   lansoprazole  30 MG capsule Commonly known as: PREVACID  TAKE 1 CAPSULE BY MOUTH DAILY BEFORE BREAKFAST.   levocetirizine 5 MG tablet Commonly known as: XYZAL  Take 1 tablet (5 mg total) by mouth every evening.   levothyroxine  25 MCG tablet Commonly known as: SYNTHROID  Take 25 mcg by mouth daily.   lidocaine -prilocaine  cream Commonly known as: EMLA  Apply 1 Application topically as needed.   Medi-Patch-Lidocaine  0.5-0.035-5-20 % Ptch Generic drug: Lido-Capsaicin-Men-Methyl Sal Apply once a day to neuralgia area, right abdomen and below the rib cage.   mupirocin  ointment 2 % Commonly known as: BACTROBAN  Apply 1 Application topically 2 (two) times daily.   ondansetron  4 MG disintegrating tablet Commonly known as: ZOFRAN -ODT Take 1 tablet (4 mg total) by mouth every 8 (eight) hours as needed for nausea or vomiting.   potassium chloride 10 MEQ CR capsule Commonly known as: MICRO-K Take 20 mEq by mouth daily.   torsemide  10 MG tablet Commonly known as: DEMADEX  Take 10 mg by mouth daily. 1/2 to 1 tab daily   Vitamin D  (Ergocalciferol ) 1.25 MG (50000 UNIT) Caps capsule Commonly known as: DRISDOL  Take 1 capsule (50,000 Units total) by mouth every 7 (seven) days.        Allergies:  Allergies  Allergen Reactions   Cephalosporins Rash   Latex Rash     Breaks out then gets infected   Lactose Intolerance (Gi) Nausea And Vomiting   Lactulose Nausea And Vomiting   Cephalexin Rash   Levofloxacin Anxiety and Rash   Meperidine  Nausea And Vomiting    Needed Phenergan  to combat nausea.   Meperidine  And Related Nausea And Vomiting    Needed Phenergan  to combat nausea.   Meperidine  Hcl Nausea And Vomiting    Needed Phenergan  to combat nausea.   Omeprazole Rash    Family History: Family History  Problem Relation Age of Onset   Stroke Mother    COPD Father    Stroke Father    Colon cancer Neg Hx     Social History:  reports that she has never smoked. She has  never been exposed to tobacco smoke. She has never used smokeless tobacco. She reports current alcohol use of about 1.0 standard drink of alcohol per week. She reports that she does not use drugs.   Physical Exam: There were no vitals taken for this visit.  Constitutional:  Alert and oriented, No acute distress. HEENT: [place] AT, moist mucus membranes.  Trachea midline, no masses. Cardiovascular: No clubbing, cyanosis, or edema. Respiratory: Normal respiratory effort, no increased work of breathing. GI: Abdomen is soft, nontender, nondistended, no abdominal masses GU: No CVA tenderness Skin: No rashes, bruises or suspicious lesions. Neurologic: Grossly intact, no focal deficits, moving all 4 extremities. Psychiatric: Normal mood and affect.  Laboratory Data: Lab Results  Component Value Date   WBC 5.2 12/11/2023   HGB 12.5 12/11/2023   HCT 39.0 12/11/2023   MCV 94 12/11/2023   PLT 133 (L) 12/11/2023    Lab Results  Component Value Date   CREATININE 0.87 12/11/2023    No results found for: PSA  No results found for: TESTOSTERONE  Lab Results  Component Value Date   HGBA1C 5.9 (H) 12/11/2023    Urinalysis    Component Value Date/Time   COLORURINE YELLOW 11/07/2023 1810   APPEARANCEUR Hazy (A) 12/11/2023 1337   LABSPEC 1.034 (H) 11/07/2023 1810   PHURINE 6.0  11/07/2023 1810   GLUCOSEU Negative 12/11/2023 1337   HGBUR SMALL (A) 11/07/2023 1810   BILIRUBINUR Negative 12/11/2023 1337   KETONESUR NEGATIVE 11/07/2023 1810   PROTEINUR Negative 12/11/2023 1337   PROTEINUR NEGATIVE 11/07/2023 1810   UROBILINOGEN 0.2 01/24/2023 1115   UROBILINOGEN 0.2 12/01/2009 1830   NITRITE Negative 12/11/2023 1337   NITRITE POSITIVE (A) 11/07/2023 1810   LEUKOCYTESUR Trace (A) 12/11/2023 1337   LEUKOCYTESUR SMALL (A) 11/07/2023 1810    Lab Results  Component Value Date   BACTERIA RARE (A) 11/07/2023    Pertinent Imaging:  Assessment & Plan:     Mixed incontinence - discussed BT, PT, meds, botox, PTNS. Also GSM and TVE - nature r/b/a to vaginal estrogen. OK with Dr. Ozan to start (double checked re: the ovarian mass). Also start Gemtesa  - can take every other day.   MH - see back for exam and cystoscopy.   No follow-ups on file.  Donnice Brooks, MD  [facility]  [address] [place], [place] [address] [phone]

## 2024-03-08 ENCOUNTER — Ambulatory Visit: Payer: Medicare Other

## 2024-03-08 VITALS — Ht 60.0 in | Wt 170.0 lb

## 2024-03-08 DIAGNOSIS — Z0000 Encounter for general adult medical examination without abnormal findings: Secondary | ICD-10-CM

## 2024-03-08 NOTE — Progress Notes (Signed)
 Subjective:   [name] is a 89 y.o. who presents for a Medicare Wellness preventive visit.  As a reminder, Annual Wellness Visits don't include a physical exam, and some assessments may be limited, especially if this visit is performed virtually. We may recommend an in-person follow-up visit with your provider if needed.  Visit Complete: Virtual I connected with  [name] on 03/08/24 by a audio enabled telemedicine application and verified that I am speaking with the correct person using two identifiers.  Patient Location: Home  Provider Location: Home Office  I discussed the limitations of evaluation and management by telemedicine. The patient expressed understanding and agreed to proceed.  Vital Signs: Because this visit was a virtual/telehealth visit, some criteria may be missing or patient reported. Any vitals not documented were not able to be obtained and vitals that have been documented are patient reported.  VideoError- Librarian, academic were attempted between this provider and patient, however failed, due to patient having technical difficulties OR patient did not have access to video capability.  We continued and completed visit with audio only.   Persons Participating in Visit: Patient.  AWV Questionnaire: Yes: Patient Medicare AWV questionnaire was completed by the patient on 03/07/2024; I have confirmed that all information answered by patient is correct and no changes since this date.  Cardiac Risk Factors include: advanced age (>9men, >15 women);obesity (BMI >30kg/m2);sedentary lifestyle;Other (see comment), Risk factor comments: asthma     Objective:    Today's Vitals   03/08/24 1446  Weight: 170 lb (77.1 kg)  Height: 5' (1.524 m)  PainSc: 6   PainLoc: Back   Body mass index is 33.2 kg/m.     03/08/2024    2:45 PM 11/07/2023    6:07 PM 03/04/2023    1:23 PM 12/10/2021    5:00 PM 09/21/2019    2:32 PM 03/16/2019    4:21  PM 03/25/2018   10:28 AM  Advanced Directives  Does Patient Have a Medical Advance Directive? No No Yes Yes Yes No Yes   Type of Surveyor, minerals;Living will Living will Living will;Healthcare Power of Attorney  Living will;Healthcare Power of Attorney  Does patient want to make changes to medical advance directive?    No - Patient declined     Copy of Healthcare Power of Attorney in Chart?   No - copy requested  No - copy requested  No - copy requested   Would patient like information on creating a medical advance directive? No - Patient declined No - Patient declined    No - Patient declined      Data saved with a previous flowsheet row definition    Current Medications (verified) Outpatient Encounter Medications as of 03/08/2024  Medication Sig   acetaminophen  (TYLENOL ) 325 MG tablet Take 2 tablets (650 mg total) by mouth every 6 (six) hours as needed for moderate pain.   albuterol  (VENTOLIN  HFA) 108 (90 Base) MCG/ACT inhaler USE 1 TO 2 INHALATIONS BY MOUTH  EVERY 6 HOURS AS NEEDED FOR  WHEEZING OR SHORTNESS OF BREATH   chlorhexidine  (HIBICLENS ) 4 % external liquid Apply topically daily as needed.   estradiol  (ESTRACE ) 0.1 MG/GM vaginal cream Place 1 g vaginally 3 (three) times a week.   famotidine  (PEPCID ) 20 MG tablet Take one dose before evening meal each day, may take second dose if needed. No more than two per day.   fluticasone  (FLONASE ) 50 MCG/ACT nasal  spray Place 2 sprays into both nostrils daily.   fluticasone -salmeterol (ADVAIR HFA) 230-21 MCG/ACT inhaler Inhale 2 puffs into the lungs 2 (two) times daily.   gabapentin  (NEURONTIN ) 100 MG capsule TAKE 3 CAPSULES BY MOUTH 3 TIMES DAILY   guaiFENesin  (ROBITUSSIN) 100 MG/5ML liquid Take 5 mLs by mouth every 4 (four) hours as needed for cough or to loosen phlegm.   ketoconazole  (NIZORAL ) 2 % cream Apply 1 application topically daily.   lansoprazole  (PREVACID ) 30 MG capsule TAKE 1 CAPSULE BY MOUTH DAILY  BEFORE BREAKFAST.   levocetirizine (XYZAL ) 5 MG tablet Take 1 tablet (5 mg total) by mouth every evening.   levothyroxine  (SYNTHROID , LEVOTHROID) 25 MCG tablet Take 25 mcg by mouth daily.   Lido-Capsaicin-Men-Methyl Sal (MEDI-PATCH-LIDOCAINE ) 0.5-0.035-5-20 % PTCH Apply once a day to neuralgia area, right abdomen and below the rib cage.   lidocaine -prilocaine  (EMLA ) cream Apply 1 Application topically as needed.   mupirocin  ointment (BACTROBAN ) 2 % Apply 1 Application topically 2 (two) times daily.   ondansetron  (ZOFRAN -ODT) 4 MG disintegrating tablet Take 1 tablet (4 mg total) by mouth every 8 (eight) hours as needed for nausea or vomiting.   potassium chloride (MICRO-K) 10 MEQ CR capsule Take 20 mEq by mouth daily.    torsemide  (DEMADEX ) 10 MG tablet Take 10 mg by mouth daily. 1/2 to 1 tab daily   Vibegron  (GEMTESA ) 75 MG TABS Take 1 tablet (75 mg total) by mouth daily.   Vitamin D , Ergocalciferol , (DRISDOL ) 1.25 MG (50000 UNIT) CAPS capsule Take 1 capsule (50,000 Units total) by mouth every 7 (seven) days.   zinc oxide (BALMEX ADULT CARE) 11.3 % CREA cream Apply 1 Application topically 2 (two) times daily. Apply twice a day to the  edematous legs   carbidopa -levodopa  (SINEMET  IR) 25-100 MG tablet Take 1 tablet by mouth 4 (four) times daily. (Patient not taking: Reported on 03/08/2024)   escitalopram (LEXAPRO) 10 MG tablet Take 10 mg by mouth daily. (Patient not taking: Reported on 03/08/2024)   No facility-administered encounter medications on file as of 03/08/2024.    Allergies (verified) Cephalosporins, Latex, Lactose intolerance (gi), Lactulose, Cephalexin, Levofloxacin, Meperidine , Meperidine  and related, Meperidine  hcl, and Omeprazole   History: Past Medical History:  Diagnosis Date   Allergy    Ankle swelling    takes torsemide  prn   Anxiety    Arthritis    Asthma    allergy induced   Back pain    Complication of anesthesia    COVID 2023   Depression    GERD (gastroesophageal  reflux disease)    HOH (hard of hearing)    Hypercholesterolemia    Hypothyroid    Parkinson disease (HCC)    PONV (postoperative nausea and vomiting)    Seasonal allergies    Past Surgical History:  Procedure Laterality Date   ABDOMINAL HYSTERECTOMY     CARPAL TUNNEL RELEASE Right 10/26/2015   Procedure: CARPAL TUNNEL RELEASE;  Surgeon: Arley Curia, MD;  Location: [facility];  Service: Orthopedics;  Laterality: Right;   CARPOMETACARPEL SUSPENSION PLASTY Right 10/26/2015   Procedure: RIGHT HAND TRAPEZIECTOMY WITH THUMB METACARPEL SUSPENSION PLASTY;  Surgeon: Arley Curia, MD;  Location: [facility];  Service: Orthopedics;  Laterality: Right;   CATARACT EXTRACTION W/PHACO  12/23/2011   Procedure: CATARACT EXTRACTION PHACO AND INTRAOCULAR LENS PLACEMENT (IOC);  Surgeon: Dow JULIANNA Burke, MD;  Location: [facility];  Service: Ophthalmology;  Laterality: Right;  CDE=5.16   CHOLECYSTECTOMY     EYE SURGERY  cataract extraction of left eye-[facility]-2002   KNEE ARTHROSCOPY     right   NISSEN FUNDOPLICATION     [facility]   TRIGGER FINGER RELEASE Right 10/26/2015   Procedure: RELEASE A-1 PULLEY RIGHT RING FINGER;  Surgeon: Arley Curia, MD;  Location: [facility];  Service: Orthopedics;  Laterality: Right;   Family History  Problem Relation Age of Onset   Stroke Mother    COPD Father    Stroke Father    Hearing loss Father    Hearing loss Brother    Hearing loss Brother    Colon cancer Neg Hx    Social History   Socioeconomic History   Marital status: Widowed    Spouse name: Not on file   Number of children: 2   Years of education: Not on file   Highest education level: 12th grade  Occupational History    Comment: retired  Tobacco Use   Smoking status: Never    Passive exposure: Never   Smokeless tobacco: Never  Vaping Use   Vaping status: Never Used  Substance and Sexual Activity   Alcohol use: Yes    Alcohol/week: 1.0 standard drink of alcohol     Comment: occ   Drug use: No   Sexual activity: Not Currently    Birth control/protection: Surgical  Other Topics Concern   Not on file  Social History Narrative   ** Merged History Encounter ** Patient lives at home alone and she is widowed.   Retired.    Education one year business course.   Right handed.   Caffeine two cup of coffee daily and 4 cups of sweet tea   Social Drivers of Corporate investment banker Strain: Low Risk  (03/08/2024)   Overall Financial Resource Strain (CARDIA)    Difficulty of Paying Living Expenses: Not hard at all  Food Insecurity: No Food Insecurity (03/08/2024)   Hunger Vital Sign    Worried About Running Out of Food in the Last Year: Never true    Ran Out of Food in the Last Year: Never true  Transportation Needs: No Transportation Needs (03/08/2024)   PRAPARE - Administrator, Civil Service (Medical): No    Lack of Transportation (Non-Medical): No  Physical Activity: Insufficiently Active (03/08/2024)   Exercise Vital Sign    Days of Exercise per Week: 1 day    Minutes of Exercise per Session: 10 min  Stress: No Stress Concern Present (03/08/2024)   [employer] of Occupational Health - Occupational Stress Questionnaire    Feeling of Stress: Not at all  Recent Concern: Stress - Stress Concern Present (01/05/2024)   [employer] of Occupational Health - Occupational Stress Questionnaire    Feeling of Stress: To some extent  Social Connections: Moderately Integrated (03/08/2024)   Social Connection and Isolation Panel    Frequency of Communication with Friends and Family: More than three times a week    Frequency of Social Gatherings with Friends and Family: More than three times a week    Attends Religious Services: More than 4 times per year    Active Member of [employer] or Organizations: Yes    Attends Banker Meetings: More than 4 times per year    Marital Status: Widowed    Tobacco Counseling Counseling given:  Yes    Clinical Intake:  Pre-visit preparation completed: Yes  Pain : No/denies pain Pain Score: 6      BMI - recorded: 33.2 Nutritional Risks: None  Diabetes: No  Lab Results  Component Value Date   HGBA1C 5.9 (H) 12/11/2023   HGBA1C 6.2 (H) 12/23/2022     How often do you need to have someone help you when you read instructions, pamphlets, or other written materials from your doctor or pharmacy?: 1 - Never  Interpreter Needed?: No  Information entered by :: Thane Age W CMA (AAMA)   Activities of Daily Living     03/07/2024    3:43 PM  In your present state of health, do you have any difficulty performing the following activities:  Hearing? 0  Vision? 0  Difficulty concentrating or making decisions? 0  Walking or climbing stairs? 0  Dressing or bathing? 0  Doing errands, shopping? 0  Preparing Food and eating ? N  Using the Toilet? N  In the past six months, have you accidently leaked urine? N  Do you have problems with loss of bowel control? N  Managing your Medications? N  Managing your Finances? N  Housekeeping or managing your Housekeeping? N    Patient Care Team: Zarwolo, Gloria, FNP as PCP - General (Family Medicine) Dohmeier, Dedra, MD as Consulting Physician (Neurology) [facility]  I have updated your Care Teams any recent Medical Services you may have received from other providers in the past year.     Assessment:   This is a routine wellness examination for Adonica.  Hearing/Vision screen Hearing Screening - Comments:: Patient denies any hearing difficulties.   Vision Screening - Comments:: Wears rx glasses - up to date with routine eye exams with  Dr. Charlie Gaudy [facility]   Goals Addressed               This Visit's Progress     I want to take another art class (pt-stated)          Depression Screen     03/08/2024    2:56 PM 01/05/2024   11:57 AM 12/11/2023   11:16 AM 10/29/2023    2:30 PM 03/04/2023    1:35  PM 12/18/2022    1:02 PM  PHQ 2/9 Scores  PHQ - 2 Score 0 0 0 1 1 0  PHQ- 9 Score 0 4 4 5 6  0     Fall Risk     03/08/2024    2:57 PM 03/07/2024    3:43 PM 12/11/2023   11:15 AM 03/04/2023    1:42 PM 12/18/2022    1:01 PM  Fall Risk   Falls in the past year? 1 1 1 1  0  Number falls in past yr: 0  0 1 0  Injury with Fall? 0 0 1 0 0  Risk for fall due to : Impaired mobility;History of fall(s)  History of fall(s);Impaired balance/gait History of fall(s);Impaired balance/gait;Impaired mobility No Fall Risks  Follow up Falls evaluation completed;Education provided;Falls prevention discussed  Falls evaluation completed Education provided;Falls prevention discussed Falls evaluation completed    MEDICARE RISK AT HOME:  Medicare Risk at Home Any stairs in or around the home?: (Patient-Rptd) No If so, are there any without handrails?: (Patient-Rptd) No Home free of loose throw rugs in walkways, pet beds, electrical cords, etc?: (Patient-Rptd) Yes Adequate lighting in your home to reduce risk of falls?: (Patient-Rptd) Yes Life alert?: (Patient-Rptd) Yes Use of a cane, walker or w/c?: (Patient-Rptd) Yes Grab bars in the bathroom?: (Patient-Rptd) Yes Shower chair or bench in shower?: (Patient-Rptd) No Elevated toilet seat or a handicapped toilet?: (Patient-Rptd) No  TIMED UP AND  GO:  Was the test performed?  No  Cognitive Function: 6CIT completed        03/08/2024    3:00 PM 03/04/2023    1:32 PM  6CIT Screen  What Year? 0 points 0 points  What month? 0 points 0 points  What time? 0 points 0 points  Count back from 20 0 points 0 points  Months in reverse 0 points 0 points  Repeat phrase 0 points 2 points  Total Score 0 points 2 points    Immunizations Immunization History  Administered Date(s) Administered   Fluad Quad(high Dose 65+) 03/27/2019, 03/29/2023   INFLUENZA, HIGH DOSE SEASONAL PF 04/04/2018   PNEUMOCOCCAL CONJUGATE-20 03/29/2023   Tdap 06/13/2021   Zoster  Recombinant(Shingrix) 02/24/2024    Screening Tests Health Maintenance  Topic Date Due   Influenza Vaccine  01/09/2024   COVID-19 Vaccine (1 - 2024-25 season) Never done   Medicare Annual Wellness (AWV)  03/08/2025   DTaP/Tdap/Td (2 - Td or Tdap) 06/14/2031   Pneumococcal Vaccine: 50+ Years  Completed   DEXA SCAN  Completed   Zoster Vaccines- Shingrix  Completed   HPV VACCINES  Aged Out   Meningococcal B Vaccine  Aged Out    Health Maintenance Health Maintenance Due  Topic Date Due   Influenza Vaccine  01/09/2024   COVID-19 Vaccine (1 - 2024-25 season) Never done   Health Maintenance Items Addressed: Patient aware of vaccines that are due. She will get those at a pharmacy next week  Additional Screening:  Vision Screening: Recommended annual ophthalmology exams for early detection of glaucoma and other disorders of the eye. Would you like a referral to an eye doctor? No    Dental Screening: Recommended annual dental exams for proper oral hygiene  Community Resource Referral / Chronic Care Management: CRR required this visit?  No   CCM required this visit?  No   Plan:    I have personally reviewed and noted the following in the patient's chart:   Medical and social history Use of alcohol, tobacco or illicit drugs  Current medications and supplements including opioid prescriptions. Patient is not currently taking opioid prescriptions. Functional ability and status Nutritional status Physical activity Advanced directives List of other physicians Hospitalizations, surgeries, and ER visits in previous 12 months Vitals Screenings to include cognitive, depression, and falls Referrals and appointments  In addition, I have reviewed and discussed with patient certain preventive protocols, quality metrics, and best practice recommendations. A written personalized care plan for preventive services as well as general preventive health recommendations were provided to  patient.   Glenis Musolf, CMA   03/08/2024   After Visit Summary: (MyChart) Due to this being a telephonic visit, the after visit summary with patients personalized plan was offered to patient via MyChart   Notes: Nothing significant to report at this time.

## 2024-03-08 NOTE — Patient Instructions (Signed)
 Ms. Mattice,  Thank you for taking the time for your Medicare Wellness Visit. I appreciate your continued commitment to your health goals. Please review the care plan we discussed, and feel free to reach out if I can assist you further.  Medicare recommends these wellness visits once per year to help you and your care team stay ahead of potential health issues. These visits are designed to focus on prevention, allowing your provider to concentrate on managing your acute and chronic conditions during your regular appointments.  Please note that Annual Wellness Visits do not include a physical exam. Some assessments may be limited, especially if the visit was conducted virtually. If needed, we may recommend a separate in-person follow-up with your provider.  Wishing you excellent health and many blessings in the year to come!  -Natasia Sanko, CMA  Ongoing Care Seeing your primary care provider every 3 to 6 months helps us  monitor your health and provide consistent, personalized care.   Recommended Screenings:  Health Maintenance  Topic Date Due   Flu Shot  01/09/2024   COVID-19 Vaccine (1 - 2024-25 season) Never done   Medicare Annual Wellness Visit  03/08/2025   DTaP/Tdap/Td vaccine (2 - Td or Tdap) 06/14/2031   Pneumococcal Vaccine for age over 83  Completed   DEXA scan (bone density measurement)  Completed   Zoster (Shingles) Vaccine  Completed   HPV Vaccine  Aged Out   Meningitis B Vaccine  Aged Out       03/08/2024    2:45 PM  Advanced Directives  Does Patient Have a Medical Advance Directive? No  Would patient like information on creating a medical advance directive? No - Patient declined   Advance Care Planning is important because it: Ensures you receive medical care that aligns with your values, goals, and preferences. Provides guidance to your family and loved ones, reducing the emotional burden of decision-making during critical moments.  Vision: Annual vision screenings are  recommended for early detection of glaucoma, cataracts, and diabetic retinopathy. These exams can also reveal signs of chronic conditions such as diabetes and high blood pressure.  Dental: Annual dental screenings help detect early signs of oral cancer, gum disease, and other conditions linked to overall health, including heart disease and diabetes.  Please see the attached documents for additional preventive care recommendations.

## 2024-04-12 ENCOUNTER — Encounter: Payer: Self-pay | Admitting: Family Medicine

## 2024-04-12 ENCOUNTER — Ambulatory Visit: Admitting: Family Medicine

## 2024-04-12 VITALS — BP 125/75 | HR 103 | Ht 60.0 in | Wt 161.0 lb

## 2024-04-12 DIAGNOSIS — F3289 Other specified depressive episodes: Secondary | ICD-10-CM

## 2024-04-12 DIAGNOSIS — K219 Gastro-esophageal reflux disease without esophagitis: Secondary | ICD-10-CM

## 2024-04-12 DIAGNOSIS — R600 Localized edema: Secondary | ICD-10-CM

## 2024-04-12 DIAGNOSIS — T7840XD Allergy, unspecified, subsequent encounter: Secondary | ICD-10-CM

## 2024-04-12 DIAGNOSIS — E038 Other specified hypothyroidism: Secondary | ICD-10-CM | POA: Diagnosis not present

## 2024-04-12 DIAGNOSIS — R7301 Impaired fasting glucose: Secondary | ICD-10-CM

## 2024-04-12 DIAGNOSIS — E782 Mixed hyperlipidemia: Secondary | ICD-10-CM

## 2024-04-12 DIAGNOSIS — E559 Vitamin D deficiency, unspecified: Secondary | ICD-10-CM

## 2024-04-12 MED ORDER — MONTELUKAST SODIUM 10 MG PO TABS: 10.0000 mg | ORAL_TABLET | Freq: Every day | ORAL | 3 refills | Status: DC

## 2024-04-12 MED ORDER — TORSEMIDE 10 MG PO TABS: ORAL_TABLET | ORAL | 0 refills | Status: DC

## 2024-04-12 NOTE — Patient Instructions (Signed)
 I appreciate the opportunity to provide care to you today!    Follow up:  5 months  Labs: please stop by the lab during the week to get your blood drawn (CBC, CMP, TSH, Lipid profile, HgA1c, Vit D)  Schedule medicare annual wellness visit  For a Healthier YOU, I Recommend: Reducing your intake of sugar, sodium, carbohydrates, and saturated fats. Increasing your fiber intake by incorporating more whole grains, fruits, and vegetables into your meals. Setting healthy goals with a focus on lowering your consumption of carbs, sugar, and unhealthy fats. Adding variety to your diet by including a wide range of fruits and vegetables. Cutting back on soda and limiting processed foods as much as possible. Staying active: In addition to taking your weight loss medication, aim for at least 150 minutes of moderate-intensity physical activity each week for optimal results.    Please follow up if your symptoms worsen or fail to improve.    Please continue to a heart-healthy diet and increase your physical activities. Try to exercise for at least five days a week.    It was a pleasure to see you and I look forward to continuing to work together on your health and well-being. Please do not hesitate to call the office if you need care or have questions about your care.  In case of emergency, please visit the Emergency Department for urgent care, or contact our clinic at [phone] to schedule an appointment. We're here to help you!   Have a wonderful day and week. With Gratitude, Meade JENEANE Gerlach MSN, FNP-BC, PMHNP-BC

## 2024-04-12 NOTE — Progress Notes (Signed)
 Established Patient Office Visit  Subjective:  Patient ID: [name], female    DOB: 06/05/1934  Age: 89 y.o. MRN: [id number]  CC:  Chief Complaint  Patient presents with   Medical Management of Chronic Issues    Four month follow up    HPI [name] is a 89 y.o. female with past medical history of GERD, Hypothyroidism, presents for f/u of  chronic medical conditions.  For the details of today's visit, please refer to the assessment and plan.    Past Medical History:  Diagnosis Date   Allergy    Ankle swelling    takes torsemide  prn   Anxiety    Arthritis    Asthma    allergy induced   Back pain    Complication of anesthesia    COVID 2023   Depression    GERD (gastroesophageal reflux disease)    HOH (hard of hearing)    Hypercholesterolemia    Hypothyroid    Parkinson disease (HCC)    PONV (postoperative nausea and vomiting)    Seasonal allergies     Past Surgical History:  Procedure Laterality Date   ABDOMINAL HYSTERECTOMY     CARPAL TUNNEL RELEASE Right 10/26/2015   Procedure: CARPAL TUNNEL RELEASE;  Surgeon: Arley Curia, MD;  Location: [facility];  Service: Orthopedics;  Laterality: Right;   CARPOMETACARPEL SUSPENSION PLASTY Right 10/26/2015   Procedure: RIGHT HAND TRAPEZIECTOMY WITH THUMB METACARPEL SUSPENSION PLASTY;  Surgeon: Arley Curia, MD;  Location: [facility];  Service: Orthopedics;  Laterality: Right;   CATARACT EXTRACTION W/PHACO  12/23/2011   Procedure: CATARACT EXTRACTION PHACO AND INTRAOCULAR LENS PLACEMENT (IOC);  Surgeon: Dow JULIANNA Burke, MD;  Location: [facility];  Service: Ophthalmology;  Laterality: Right;  CDE=5.16   CHOLECYSTECTOMY     EYE SURGERY     cataract extraction of left eye-[facility]-2002   KNEE ARTHROSCOPY     right   NISSEN FUNDOPLICATION     [facility]   TRIGGER FINGER RELEASE Right 10/26/2015   Procedure: RELEASE A-1 PULLEY RIGHT RING FINGER;  Surgeon: Arley Curia, MD;  Location: [facility];   Service: Orthopedics;  Laterality: Right;    Family History  Problem Relation Age of Onset   Stroke Mother    COPD Father    Stroke Father    Hearing loss Father    Hearing loss Brother    Hearing loss Brother    Colon cancer Neg Hx     Social History   Socioeconomic History   Marital status: Widowed    Spouse name: Not on file   Number of children: 2   Years of education: Not on file   Highest education level: 12th grade  Occupational History    Comment: retired  Tobacco Use   Smoking status: Never    Passive exposure: Never   Smokeless tobacco: Never  Vaping Use   Vaping status: Never Used  Substance and Sexual Activity   Alcohol use: Yes    Alcohol/week: 1.0 standard drink of alcohol    Comment: occ   Drug use: No   Sexual activity: Not Currently    Birth control/protection: Surgical  Other Topics Concern   Not on file  Social History Narrative   ** Merged History Encounter ** Patient lives at home alone and she is widowed.   Retired.    Education one year business course.   Right handed.   Caffeine two cup of coffee daily and 4  cups of sweet tea   Social Drivers of Corporate Investment Banker Strain: Low Risk  (03/08/2024)   Overall Financial Resource Strain (CARDIA)    Difficulty of Paying Living Expenses: Not hard at all  Food Insecurity: No Food Insecurity (03/08/2024)   Hunger Vital Sign    Worried About Running Out of Food in the Last Year: Never true    Ran Out of Food in the Last Year: Never true  Transportation Needs: No Transportation Needs (03/08/2024)   PRAPARE - Administrator, Civil Service (Medical): No    Lack of Transportation (Non-Medical): No  Physical Activity: Insufficiently Active (03/08/2024)   Exercise Vital Sign    Days of Exercise per Week: 1 day    Minutes of Exercise per Session: 10 min  Stress: No Stress Concern Present (03/08/2024)   [employer] of Occupational Health - Occupational Stress Questionnaire     Feeling of Stress: Not at all  Recent Concern: Stress - Stress Concern Present (01/05/2024)   [employer] of Occupational Health - Occupational Stress Questionnaire    Feeling of Stress: To some extent  Social Connections: Moderately Integrated (03/08/2024)   Social Connection and Isolation Panel    Frequency of Communication with Friends and Family: More than three times a week    Frequency of Social Gatherings with Friends and Family: More than three times a week    Attends Religious Services: More than 4 times per year    Active Member of [employer] or Organizations: Yes    Attends Banker Meetings: More than 4 times per year    Marital Status: Widowed  Intimate Partner Violence: Not At Risk (03/08/2024)   Humiliation, Afraid, Rape, and Kick questionnaire    Fear of Current or Ex-Partner: No    Emotionally Abused: No    Physically Abused: No    Sexually Abused: No    Outpatient Medications Prior to Visit  Medication Sig Dispense Refill   acetaminophen  (TYLENOL ) 325 MG tablet Take 2 tablets (650 mg total) by mouth every 6 (six) hours as needed for moderate pain. 30 tablet 0   albuterol  (VENTOLIN  HFA) 108 (90 Base) MCG/ACT inhaler USE 1 TO 2 INHALATIONS BY MOUTH  EVERY 6 HOURS AS NEEDED FOR  WHEEZING OR SHORTNESS OF BREATH 36 g 6   carbidopa -levodopa  (SINEMET  IR) 25-100 MG tablet Take 1 tablet by mouth 4 (four) times daily. (Patient not taking: Reported on 03/08/2024) 360 tablet 2   chlorhexidine  (HIBICLENS ) 4 % external liquid Apply topically daily as needed. 236 mL 0   escitalopram (LEXAPRO) 10 MG tablet Take 10 mg by mouth daily. (Patient not taking: Reported on 03/08/2024)     estradiol  (ESTRACE ) 0.1 MG/GM vaginal cream Place 1 g vaginally 3 (three) times a week. 42.5 g 1   famotidine  (PEPCID ) 20 MG tablet Take one dose before evening meal each day, may take second dose if needed. No more than two per day. 60 tablet 5   fluticasone  (FLONASE ) 50 MCG/ACT nasal spray Place 2  sprays into both nostrils daily. 16 g 0   fluticasone -salmeterol (ADVAIR HFA) 230-21 MCG/ACT inhaler Inhale 2 puffs into the lungs 2 (two) times daily. 1 each 12   gabapentin  (NEURONTIN ) 100 MG capsule TAKE 3 CAPSULES BY MOUTH 3 TIMES DAILY 810 capsule 3   guaiFENesin  (ROBITUSSIN) 100 MG/5ML liquid Take 5 mLs by mouth every 4 (four) hours as needed for cough or to loosen phlegm. 120 mL 0   ketoconazole  (NIZORAL )  2 % cream Apply 1 application topically daily. 30 g 0   lansoprazole  (PREVACID ) 30 MG capsule TAKE 1 CAPSULE BY MOUTH DAILY BEFORE BREAKFAST. 90 capsule 1   levocetirizine (XYZAL ) 5 MG tablet Take 1 tablet (5 mg total) by mouth every evening. 60 tablet 1   levothyroxine  (SYNTHROID , LEVOTHROID) 25 MCG tablet Take 25 mcg by mouth daily.     Lido-Capsaicin-Men-Methyl Sal (MEDI-PATCH-LIDOCAINE ) 0.5-0.035-5-20 % PTCH Apply once a day to neuralgia area, right abdomen and below the rib cage. 30 patch 1   lidocaine -prilocaine  (EMLA ) cream Apply 1 Application topically as needed. 30 g 5   mupirocin  ointment (BACTROBAN ) 2 % Apply 1 Application topically 2 (two) times daily. 22 g 0   ondansetron  (ZOFRAN -ODT) 4 MG disintegrating tablet Take 1 tablet (4 mg total) by mouth every 8 (eight) hours as needed for nausea or vomiting. 20 tablet 0   potassium chloride (MICRO-K) 10 MEQ CR capsule Take 20 mEq by mouth daily.      Vibegron  (GEMTESA ) 75 MG TABS Take 1 tablet (75 mg total) by mouth daily. 30 tablet 11   Vitamin D , Ergocalciferol , (DRISDOL ) 1.25 MG (50000 UNIT) CAPS capsule Take 1 capsule (50,000 Units total) by mouth every 7 (seven) days. 27 capsule 1   zinc oxide (BALMEX ADULT CARE) 11.3 % CREA cream Apply 1 Application topically 2 (two) times daily. Apply twice a day to the  edematous legs 120 g 1   torsemide  (DEMADEX ) 10 MG tablet Take 10 mg by mouth daily. 1/2 to 1 tab daily     No facility-administered medications prior to visit.    Allergies  Allergen Reactions   Cephalosporins Rash    Latex Rash    Breaks out then gets infected   Lactose Intolerance (Gi) Nausea And Vomiting   Lactulose Nausea And Vomiting   Cephalexin Rash   Levofloxacin Anxiety and Rash   Meperidine  Nausea And Vomiting    Needed Phenergan  to combat nausea.   Meperidine  And Related Nausea And Vomiting    Needed Phenergan  to combat nausea.   Meperidine  Hcl Nausea And Vomiting    Needed Phenergan  to combat nausea.   Omeprazole Rash    ROS Review of Systems  Constitutional:  Negative for chills and fever.  Eyes:  Negative for visual disturbance.  Respiratory:  Negative for chest tightness and shortness of breath.   Neurological:  Negative for dizziness and headaches.      Objective:    Physical Exam HENT:     Head: Normocephalic.     Mouth/Throat:     Mouth: Mucous membranes are moist.  Cardiovascular:     Rate and Rhythm: Normal rate.     Heart sounds: Normal heart sounds.  Pulmonary:     Effort: Pulmonary effort is normal.     Breath sounds: Normal breath sounds.  Neurological:     Mental Status: She is alert.     BP 125/75   Pulse (!) 103   Ht 5' (1.524 m)   Wt 161 lb (73 kg)   SpO2 91%   BMI 31.44 kg/m  Wt Readings from Last 3 Encounters:  04/16/24 161 lb (73 kg)  04/12/24 161 lb (73 kg)  03/08/24 170 lb (77.1 kg)    Lab Results  Component Value Date   TSH 4.560 (H) 04/15/2024   Lab Results  Component Value Date   WBC 5.5 04/15/2024   HGB 12.2 04/15/2024   HCT 39.0 04/15/2024   MCV 93 04/15/2024   PLT  360 04/15/2024   Lab Results  Component Value Date   NA 139 04/15/2024   K 5.1 04/15/2024   CO2 21 04/15/2024   GLUCOSE 99 04/15/2024   BUN 19 04/15/2024   CREATININE 1.10 (H) 04/15/2024   BILITOT 0.3 04/15/2024   ALKPHOS 134 (H) 04/15/2024   AST 18 04/15/2024   ALT 19 04/15/2024   PROT 6.7 04/15/2024   ALBUMIN 4.2 04/15/2024   CALCIUM  9.6 04/15/2024   ANIONGAP 11 11/07/2023   EGFR 48 (L) 04/15/2024   Lab Results  Component Value Date   CHOL 168  04/15/2024   Lab Results  Component Value Date   HDL 39 (L) 04/15/2024   Lab Results  Component Value Date   LDLCALC 109 (H) 04/15/2024   Lab Results  Component Value Date   TRIG 109 04/15/2024   Lab Results  Component Value Date   CHOLHDL 4.3 04/15/2024   Lab Results  Component Value Date   HGBA1C 6.1 (H) 04/15/2024      Assessment & Plan:  Bilateral lower extremity edema Assessment & Plan: Torsemide  refilled. The patient reports that the left foot swells more than the right. Recommended daily elevation of the legs for 20-30 minutes above heart level. Advised daily use of compression stockings, regular exercise, and weight reduction. Instructed the patient to avoid prolonged sitting or standing to help reduce swelling and improve circulation.     Orders: -     Torsemide ; 1/2 to 1 tab daily  Dispense: 60 tablet; Refill: 0  Other specified hypothyroidism Assessment & Plan: Encouraged to continue treatment regimen as is (synthroid  25 mcg)  The patient is encouraged to follow up if experiencing symptoms of hypothyroidism, despite compliance with the current treatment regimen, such as fatigue, weight gain, constipation, dry skin, cold intolerance, hair loss, or depression.    Gastroesophageal reflux disease without esophagitis Assessment & Plan: Continue treatment regimen as is GERD diet encouraged   Other depression Assessment & Plan: Stable Denies SI/HI and AVH   IFG (impaired fasting glucose) -     Hemoglobin A1c  Vitamin D  deficiency -     VITAMIN D  25 Hydroxy (Vit-D Deficiency, Fractures)  TSH (thyroid -stimulating hormone deficiency) -     TSH + free T4  Mixed hyperlipidemia -     Lipid panel -     CMP14+EGFR -     CBC with Differential/Platelet  Allergy, subsequent encounter -     Montelukast  Sodium; Take 1 tablet (10 mg total) by mouth at bedtime.  Dispense: 30 tablet; Refill: 3  Note: This chart has been completed using [employer] software, and while attempts have been made to ensure accuracy, certain words and phrases may not be transcribed as intended.    Follow-up: Return in about 5 months (around 09/10/2024).   Abdulaziz Toman  Z Bacchus, FNP

## 2024-04-16 ENCOUNTER — Ambulatory Visit: Admitting: Family Medicine

## 2024-04-16 ENCOUNTER — Encounter: Payer: Self-pay | Admitting: Family Medicine

## 2024-04-16 VITALS — BP 162/77 | HR 105 | Ht 60.0 in | Wt 161.0 lb

## 2024-04-16 DIAGNOSIS — N3090 Cystitis, unspecified without hematuria: Secondary | ICD-10-CM

## 2024-04-16 DIAGNOSIS — R300 Dysuria: Secondary | ICD-10-CM

## 2024-04-16 LAB — CMP14+EGFR
ALT: 19 IU/L (ref 0–32)
AST: 18 IU/L (ref 0–40)
Albumin: 4.2 g/dL (ref 3.7–4.7)
Alkaline Phosphatase: 134 IU/L — ABNORMAL HIGH (ref 48–129)
BUN/Creatinine Ratio: 17 (ref 12–28)
BUN: 19 mg/dL (ref 8–27)
Bilirubin Total: 0.3 mg/dL (ref 0.0–1.2)
CO2: 21 mmol/L (ref 20–29)
Calcium: 9.6 mg/dL (ref 8.7–10.3)
Chloride: 104 mmol/L (ref 96–106)
Creatinine, Ser: 1.1 mg/dL — ABNORMAL HIGH (ref 0.57–1.00)
Globulin, Total: 2.5 g/dL (ref 1.5–4.5)
Glucose: 99 mg/dL (ref 70–99)
Potassium: 5.1 mmol/L (ref 3.5–5.2)
Sodium: 139 mmol/L (ref 134–144)
Total Protein: 6.7 g/dL (ref 6.0–8.5)
eGFR: 48 mL/min/1.73 — ABNORMAL LOW (ref >59)

## 2024-04-16 LAB — CBC WITH DIFFERENTIAL/PLATELET
Basophils Absolute: 0 x10E3/uL (ref 0.0–0.2)
Basos: 0 %
EOS (ABSOLUTE): 0 x10E3/uL (ref 0.0–0.4)
Eos: 0 %
Hematocrit: 39 % (ref 34.0–46.6)
Hemoglobin: 12.2 g/dL (ref 11.1–15.9)
Immature Grans (Abs): 0.1 x10E3/uL (ref 0.0–0.1)
Immature Granulocytes: 1 %
Lymphocytes Absolute: 2 x10E3/uL (ref 0.7–3.1)
Lymphs: 36 %
MCH: 29 pg (ref 26.6–33.0)
MCHC: 31.3 g/dL — ABNORMAL LOW (ref 31.5–35.7)
MCV: 93 fL (ref 79–97)
Monocytes Absolute: 0.8 x10E3/uL (ref 0.1–0.9)
Monocytes: 15 %
Neutrophils Absolute: 2.7 x10E3/uL (ref 1.4–7.0)
Neutrophils: 48 %
Platelets: 360 x10E3/uL (ref 150–450)
RBC: 4.2 x10E6/uL (ref 3.77–5.28)
RDW: 13.2 % (ref 11.7–15.4)
WBC: 5.5 x10E3/uL (ref 3.4–10.8)

## 2024-04-16 LAB — LIPID PANEL
Chol/HDL Ratio: 4.3 ratio (ref 0.0–4.4)
Cholesterol, Total: 168 mg/dL (ref 100–199)
HDL: 39 mg/dL — ABNORMAL LOW (ref >39)
LDL Chol Calc (NIH): 109 mg/dL — ABNORMAL HIGH (ref 0–99)
Triglycerides: 109 mg/dL (ref 0–149)
VLDL Cholesterol Cal: 20 mg/dL (ref 5–40)

## 2024-04-16 LAB — HEMOGLOBIN A1C
Est. average glucose Bld gHb Est-mCnc: 128 mg/dL
Hgb A1c MFr Bld: 6.1 % — ABNORMAL HIGH (ref 4.8–5.6)

## 2024-04-16 LAB — TSH+FREE T4
Free T4: 0.96 ng/dL (ref 0.82–1.77)
TSH: 4.56 u[IU]/mL — ABNORMAL HIGH (ref 0.450–4.500)

## 2024-04-16 LAB — VITAMIN D 25 HYDROXY (VIT D DEFICIENCY, FRACTURES): Vit D, 25-Hydroxy: 51 ng/mL (ref 30.0–100.0)

## 2024-04-16 MED ORDER — SULFAMETHOXAZOLE-TRIMETHOPRIM 800-160 MG PO TABS: 1.0000 | ORAL_TABLET | Freq: Two times a day (BID) | ORAL | 0 refills | Status: AC

## 2024-04-16 NOTE — Progress Notes (Signed)
 Acute Office Visit  Subjective:    Patient ID: [name], female    DOB: 04-19-1934, 89 y.o.   MRN: [id number]  Chief Complaint  Patient presents with   Urinary Tract Infection    Burning, pressure, urinary frequency     HPI Patient is in today for with the above complaints. The patient is currently under the care of urology.  For the details of today's visit, please refer to the assessment and plan.     Past Medical History:  Diagnosis Date   Allergy    Ankle swelling    takes torsemide  prn   Anxiety    Arthritis    Asthma    allergy induced   Back pain    Complication of anesthesia    COVID 2023   Depression    GERD (gastroesophageal reflux disease)    HOH (hard of hearing)    Hypercholesterolemia    Hypothyroid    Parkinson disease (HCC)    PONV (postoperative nausea and vomiting)    Seasonal allergies     Past Surgical History:  Procedure Laterality Date   ABDOMINAL HYSTERECTOMY     CARPAL TUNNEL RELEASE Right 10/26/2015   Procedure: CARPAL TUNNEL RELEASE;  Surgeon: Arley Curia, MD;  Location: [facility];  Service: Orthopedics;  Laterality: Right;   CARPOMETACARPEL SUSPENSION PLASTY Right 10/26/2015   Procedure: RIGHT HAND TRAPEZIECTOMY WITH THUMB METACARPEL SUSPENSION PLASTY;  Surgeon: Arley Curia, MD;  Location: [facility];  Service: Orthopedics;  Laterality: Right;   CATARACT EXTRACTION W/PHACO  12/23/2011   Procedure: CATARACT EXTRACTION PHACO AND INTRAOCULAR LENS PLACEMENT (IOC);  Surgeon: Dow JULIANNA Burke, MD;  Location: [facility];  Service: Ophthalmology;  Laterality: Right;  CDE=5.16   CHOLECYSTECTOMY     EYE SURGERY     cataract extraction of left eye-[facility]-2002   KNEE ARTHROSCOPY     right   NISSEN FUNDOPLICATION     [facility]   TRIGGER FINGER RELEASE Right 10/26/2015   Procedure: RELEASE A-1 PULLEY RIGHT RING FINGER;  Surgeon: Arley Curia, MD;  Location: [facility];  Service: Orthopedics;  Laterality: Right;     Family History  Problem Relation Age of Onset   Stroke Mother    COPD Father    Stroke Father    Hearing loss Father    Hearing loss Brother    Hearing loss Brother    Colon cancer Neg Hx     Social History   Socioeconomic History   Marital status: Widowed    Spouse name: Not on file   Number of children: 2   Years of education: Not on file   Highest education level: 12th grade  Occupational History    Comment: retired  Tobacco Use   Smoking status: Never    Passive exposure: Never   Smokeless tobacco: Never  Vaping Use   Vaping status: Never Used  Substance and Sexual Activity   Alcohol use: Yes    Alcohol/week: 1.0 standard drink of alcohol    Comment: occ   Drug use: No   Sexual activity: Not Currently    Birth control/protection: Surgical  Other Topics Concern   Not on file  Social History Narrative   ** Merged History Encounter ** Patient lives at home alone and she is widowed.   Retired.    Education one year business course.   Right handed.   Caffeine two cup of coffee daily and 4 cups of sweet tea  Social Drivers of Corporate Investment Banker Strain: Low Risk  (03/08/2024)   Overall Financial Resource Strain (CARDIA)    Difficulty of Paying Living Expenses: Not hard at all  Food Insecurity: No Food Insecurity (03/08/2024)   Hunger Vital Sign    Worried About Running Out of Food in the Last Year: Never true    Ran Out of Food in the Last Year: Never true  Transportation Needs: No Transportation Needs (03/08/2024)   PRAPARE - Administrator, Civil Service (Medical): No    Lack of Transportation (Non-Medical): No  Physical Activity: Insufficiently Active (03/08/2024)   Exercise Vital Sign    Days of Exercise per Week: 1 day    Minutes of Exercise per Session: 10 min  Stress: No Stress Concern Present (03/08/2024)   [employer] of Occupational Health - Occupational Stress Questionnaire    Feeling of Stress: Not at all  Recent  Concern: Stress - Stress Concern Present (01/05/2024)   [employer] of Occupational Health - Occupational Stress Questionnaire    Feeling of Stress: To some extent  Social Connections: Moderately Integrated (03/08/2024)   Social Connection and Isolation Panel    Frequency of Communication with Friends and Family: More than three times a week    Frequency of Social Gatherings with Friends and Family: More than three times a week    Attends Religious Services: More than 4 times per year    Active Member of [employer] or Organizations: Yes    Attends Banker Meetings: More than 4 times per year    Marital Status: Widowed  Intimate Partner Violence: Not At Risk (03/08/2024)   Humiliation, Afraid, Rape, and Kick questionnaire    Fear of Current or Ex-Partner: No    Emotionally Abused: No    Physically Abused: No    Sexually Abused: No    Outpatient Medications Prior to Visit  Medication Sig Dispense Refill   acetaminophen  (TYLENOL ) 325 MG tablet Take 2 tablets (650 mg total) by mouth every 6 (six) hours as needed for moderate pain. 30 tablet 0   albuterol  (VENTOLIN  HFA) 108 (90 Base) MCG/ACT inhaler USE 1 TO 2 INHALATIONS BY MOUTH  EVERY 6 HOURS AS NEEDED FOR  WHEEZING OR SHORTNESS OF BREATH 36 g 6   carbidopa -levodopa  (SINEMET  IR) 25-100 MG tablet Take 1 tablet by mouth 4 (four) times daily. (Patient not taking: Reported on 03/08/2024) 360 tablet 2   chlorhexidine  (HIBICLENS ) 4 % external liquid Apply topically daily as needed. 236 mL 0   escitalopram (LEXAPRO) 10 MG tablet Take 10 mg by mouth daily. (Patient not taking: Reported on 03/08/2024)     estradiol  (ESTRACE ) 0.1 MG/GM vaginal cream Place 1 g vaginally 3 (three) times a week. 42.5 g 1   famotidine  (PEPCID ) 20 MG tablet Take one dose before evening meal each day, may take second dose if needed. No more than two per day. 60 tablet 5   fluticasone  (FLONASE ) 50 MCG/ACT nasal spray Place 2 sprays into both nostrils daily. 16 g 0    fluticasone -salmeterol (ADVAIR HFA) 230-21 MCG/ACT inhaler Inhale 2 puffs into the lungs 2 (two) times daily. 1 each 12   gabapentin  (NEURONTIN ) 100 MG capsule TAKE 3 CAPSULES BY MOUTH 3 TIMES DAILY 810 capsule 3   guaiFENesin  (ROBITUSSIN) 100 MG/5ML liquid Take 5 mLs by mouth every 4 (four) hours as needed for cough or to loosen phlegm. 120 mL 0   ketoconazole  (NIZORAL ) 2 % cream Apply 1 application  topically daily. 30 g 0   lansoprazole  (PREVACID ) 30 MG capsule TAKE 1 CAPSULE BY MOUTH DAILY BEFORE BREAKFAST. 90 capsule 1   levocetirizine (XYZAL ) 5 MG tablet Take 1 tablet (5 mg total) by mouth every evening. 60 tablet 1   levothyroxine  (SYNTHROID , LEVOTHROID) 25 MCG tablet Take 25 mcg by mouth daily.     Lido-Capsaicin-Men-Methyl Sal (MEDI-PATCH-LIDOCAINE ) 0.5-0.035-5-20 % PTCH Apply once a day to neuralgia area, right abdomen and below the rib cage. 30 patch 1   lidocaine -prilocaine  (EMLA ) cream Apply 1 Application topically as needed. 30 g 5   montelukast  (SINGULAIR ) 10 MG tablet Take 1 tablet (10 mg total) by mouth at bedtime. 30 tablet 3   mupirocin  ointment (BACTROBAN ) 2 % Apply 1 Application topically 2 (two) times daily. 22 g 0   ondansetron  (ZOFRAN -ODT) 4 MG disintegrating tablet Take 1 tablet (4 mg total) by mouth every 8 (eight) hours as needed for nausea or vomiting. 20 tablet 0   potassium chloride (MICRO-K) 10 MEQ CR capsule Take 20 mEq by mouth daily.      torsemide  (DEMADEX ) 10 MG tablet 1/2 to 1 tab daily 60 tablet 0   Vibegron  (GEMTESA ) 75 MG TABS Take 1 tablet (75 mg total) by mouth daily. 30 tablet 11   Vitamin D , Ergocalciferol , (DRISDOL ) 1.25 MG (50000 UNIT) CAPS capsule Take 1 capsule (50,000 Units total) by mouth every 7 (seven) days. 27 capsule 1   zinc oxide (BALMEX ADULT CARE) 11.3 % CREA cream Apply 1 Application topically 2 (two) times daily. Apply twice a day to the  edematous legs 120 g 1   No facility-administered medications prior to visit.    Allergies   Allergen Reactions   Cephalosporins Rash   Latex Rash    Breaks out then gets infected   Lactose Intolerance (Gi) Nausea And Vomiting   Lactulose Nausea And Vomiting   Cephalexin Rash   Levofloxacin Anxiety and Rash   Meperidine  Nausea And Vomiting    Needed Phenergan  to combat nausea.   Meperidine  And Related Nausea And Vomiting    Needed Phenergan  to combat nausea.   Meperidine  Hcl Nausea And Vomiting    Needed Phenergan  to combat nausea.   Omeprazole Rash    Review of Systems  Constitutional:  Negative for chills and fever.  Eyes:  Negative for visual disturbance.  Respiratory:  Negative for chest tightness and shortness of breath.   Genitourinary:  Positive for dysuria, frequency and urgency.  Neurological:  Negative for dizziness and headaches.       Objective:    Physical Exam HENT:     Head: Normocephalic.     Mouth/Throat:     Mouth: Mucous membranes are moist.  Cardiovascular:     Rate and Rhythm: Normal rate.     Heart sounds: Normal heart sounds.  Pulmonary:     Effort: Pulmonary effort is normal.     Breath sounds: Normal breath sounds.  Abdominal:     Tenderness: There is abdominal tenderness in the suprapubic area.  Neurological:     Mental Status: She is alert.     BP (!) 162/77   Pulse (!) 105   Ht 5' (1.524 m)   Wt 161 lb (73 kg)   SpO2 94%   BMI 31.44 kg/m  Wt Readings from Last 3 Encounters:  04/16/24 161 lb (73 kg)  04/12/24 161 lb (73 kg)  03/08/24 170 lb (77.1 kg)       Assessment & Plan:  Cystitis Assessment &  Plan: Encouraged to take bactrim  (sulfamethoxazole /trimethoprim ) 1 tablet twice daily for 7 days.  To help prevent future urinary tract infections (UTIs), consider the following practices:  Avoid Prolonged Urination: Do not hold urine for extended periods, as this can stretch the bladder and create an environment conducive to bacterial growth. Prompt Bladder Emptying: Empty your bladder as soon as you feel the  urge. Post-Intercourse Hygiene: Empty your bladder soon after intercourse to reduce the risk of infection. Shower Instead of Bath: Opt for showers rather than baths to minimize bacterial exposure. Proper Wiping Technique: Wipe from front to back after urinating and bowel movements to prevent the transfer of bacteria from the anal region to the vagina and urethra. Hydration: Drink a full glass of water regularly to help flush bacteria from your system.  The urinalysis is negative for leukocytes and nitrates; however, therapy will be initiated due to the patient's symptomatic presentation, and a urine culture will be obtained for confirmation.    Orders: -     Urinalysis -     Urine Culture -     Sulfamethoxazole -Trimethoprim ; Take 1 tablet by mouth 2 (two) times daily for 7 days.  Dispense: 14 tablet; Refill: 0  Note: This chart has been completed using Engineer, Civil (consulting) software, and while attempts have been made to ensure accuracy, certain words and phrases may not be transcribed as intended.    Ziyad Dyar  Z Bacchus, FNP

## 2024-04-16 NOTE — Patient Instructions (Signed)
 I appreciate the opportunity to provide care to you today!   Bactrim  (sulfamethoxazole /trimethoprim ) 1 tablet twice daily for 7 days.  To help prevent future urinary tract infections (UTIs), consider the following practices:  Avoid Prolonged Urination: Do not hold urine for extended periods, as this can stretch the bladder and create an environment conducive to bacterial growth. Prompt Bladder Emptying: Empty your bladder as soon as you feel the urge. Post-Intercourse Hygiene: Empty your bladder soon after intercourse to reduce the risk of infection. Shower Instead of Bath: Opt for showers rather than baths to minimize bacterial exposure. Proper Wiping Technique: Wipe from front to back after urinating and bowel movements to prevent the transfer of bacteria from the anal region to the vagina and urethra. Hydration: Drink a full glass of water regularly to help flush bacteria from your system.  The urinalysis is negative for leukocytes and nitrates; however, therapy will be initiated due to the patient's symptomatic presentation, and a urine culture will be obtained for confirmation.    Please continue to a heart-healthy diet and increase your physical activities. Try to exercise for at least five days a week.    It was a pleasure to see you and I look forward to continuing to work together on your health and well-being. Please do not hesitate to call the office if you need care or have questions about your care.  In case of emergency, please visit the Emergency Department for urgent care, or contact our clinic at [phone] to schedule an appointment. We're here to help you!   Have a wonderful day and week. With Gratitude, Meade JENEANE Gerlach MSN, FNP-BC, PMHNP-BC

## 2024-04-17 DIAGNOSIS — R600 Localized edema: Secondary | ICD-10-CM | POA: Insufficient documentation

## 2024-04-17 NOTE — Assessment & Plan Note (Signed)
 Stable  Denies SI/HI and AVH

## 2024-04-17 NOTE — Assessment & Plan Note (Signed)
 Encouraged to continue treatment regimen as is (synthroid  25 mcg)  The patient is encouraged to follow up if experiencing symptoms of hypothyroidism, despite compliance with the current treatment regimen, such as fatigue, weight gain, constipation, dry skin, cold intolerance, hair loss, or depression.

## 2024-04-17 NOTE — Assessment & Plan Note (Signed)
 Continue treatment regimen as is  GERD diet encouraged

## 2024-04-17 NOTE — Assessment & Plan Note (Signed)
 Torsemide  refilled. The patient reports that the left foot swells more than the right. Recommended daily elevation of the legs for 20-30 minutes above heart level. Advised daily use of compression stockings, regular exercise, and weight reduction. Instructed the patient to avoid prolonged sitting or standing to help reduce swelling and improve circulation.

## 2024-04-19 DIAGNOSIS — N3090 Cystitis, unspecified without hematuria: Secondary | ICD-10-CM | POA: Insufficient documentation

## 2024-04-19 NOTE — Assessment & Plan Note (Signed)
 Encouraged to take bactrim  (sulfamethoxazole /trimethoprim ) 1 tablet twice daily for 7 days.  To help prevent future urinary tract infections (UTIs), consider the following practices:  Avoid Prolonged Urination: Do not hold urine for extended periods, as this can stretch the bladder and create an environment conducive to bacterial growth. Prompt Bladder Emptying: Empty your bladder as soon as you feel the urge. Post-Intercourse Hygiene: Empty your bladder soon after intercourse to reduce the risk of infection. Shower Instead of Bath: Opt for showers rather than baths to minimize bacterial exposure. Proper Wiping Technique: Wipe from front to back after urinating and bowel movements to prevent the transfer of bacteria from the anal region to the vagina and urethra. Hydration: Drink a full glass of water regularly to help flush bacteria from your system.  The urinalysis is negative for leukocytes and nitrates; however, therapy will be initiated due to the patient's symptomatic presentation, and a urine culture will be obtained for confirmation.

## 2024-04-21 LAB — URINE CULTURE

## 2024-04-21 LAB — URINALYSIS
Bilirubin, UA: NEGATIVE
Glucose, UA: NEGATIVE
Ketones, UA: NEGATIVE
Nitrite, UA: NEGATIVE
Protein,UA: NEGATIVE
Specific Gravity, UA: 1.01 (ref 1.005–1.030)
Urobilinogen, Ur: 0.2 mg/dL (ref 0.2–1.0)
pH, UA: 5.5 (ref 5.0–7.5)

## 2024-04-23 ENCOUNTER — Other Ambulatory Visit: Payer: Self-pay | Admitting: Family Medicine

## 2024-04-23 ENCOUNTER — Encounter: Payer: Self-pay | Admitting: Family Medicine

## 2024-04-23 DIAGNOSIS — E038 Other specified hypothyroidism: Secondary | ICD-10-CM

## 2024-04-23 MED ORDER — LEVOTHYROXINE SODIUM 25 MCG PO TABS: 25.0000 ug | ORAL_TABLET | Freq: Every day | ORAL | 1 refills | Status: AC

## 2024-04-23 NOTE — Telephone Encounter (Signed)
 Rx sent.

## 2024-04-25 ENCOUNTER — Ambulatory Visit: Payer: Self-pay | Admitting: Family Medicine

## 2024-04-25 ENCOUNTER — Other Ambulatory Visit: Payer: Self-pay | Admitting: Family Medicine

## 2024-04-25 DIAGNOSIS — N3090 Cystitis, unspecified without hematuria: Secondary | ICD-10-CM

## 2024-04-26 ENCOUNTER — Ambulatory Visit: Admitting: Urology

## 2024-04-26 ENCOUNTER — Encounter: Payer: Self-pay | Admitting: Urology

## 2024-04-26 VITALS — BP 132/78 | HR 99

## 2024-04-26 DIAGNOSIS — Z87440 Personal history of urinary (tract) infections: Secondary | ICD-10-CM | POA: Diagnosis not present

## 2024-04-26 DIAGNOSIS — N390 Urinary tract infection, site not specified: Secondary | ICD-10-CM | POA: Diagnosis not present

## 2024-04-26 DIAGNOSIS — R3129 Other microscopic hematuria: Secondary | ICD-10-CM | POA: Diagnosis not present

## 2024-04-26 LAB — URINALYSIS, ROUTINE W REFLEX MICROSCOPIC
Bilirubin, UA: NEGATIVE
Glucose, UA: NEGATIVE
Ketones, UA: NEGATIVE
Nitrite, UA: POSITIVE — AB
Protein,UA: NEGATIVE
Specific Gravity, UA: <1.005 — ABNORMAL LOW (ref 1.005–1.030)
Urobilinogen, Ur: 0.2 mg/dL (ref 0.2–1.0)
pH, UA: 6 (ref 5.0–7.5)

## 2024-04-26 LAB — MICROSCOPIC EXAMINATION

## 2024-04-26 MED ORDER — DOXYCYCLINE HYCLATE 100 MG PO CAPS: 100.0000 mg | ORAL_CAPSULE | Freq: Every day | ORAL | 0 refills | Status: DC

## 2024-04-26 NOTE — Progress Notes (Unsigned)
 04/26/2024 11:47 AM   [name] [birth year] [id number]  Referring provider: Edman Meade PEDLAR, FNP [address] [place],  [place] [address]  No chief complaint on file.   HPI:  F/u -    1) incontinence - she leaks with some urgency and once starts loses it. Turn key and water running. She uses 3-4 depends. If she voids q 2 hours less wet. Stream OK. No GU Meds or surgery.  NG risk includes a movement disorder (like parkinson's).   2) recurrent UTI - She has occasional dysuria. No gross hematuria. She got 3-4 UTI. Jul 2025 CT a/p - benign GU. R>L ERP. 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 Bladder not imaged. No breast cancer.   Today, seen for cystoscopy but has bladder pain and dysuria. Treated for UTI earlier this month - klebsiella - with Bactrim  but R to bactrim , NF, ampicillin. Sep 2025 started TVE, but reports not taking while on abx. Ok with Dr. Ozan.  Needs cysto/exam for MH.    Today, seen for the above.   PMH: Past Medical History:  Diagnosis Date   Allergy    Ankle swelling    takes torsemide  prn   Anxiety    Arthritis    Asthma    allergy induced   Back pain    Complication of anesthesia    COVID 2023   Depression    GERD (gastroesophageal reflux disease)    HOH (hard of hearing)    Hypercholesterolemia    Hypothyroid    Parkinson disease (HCC)    PONV (postoperative nausea and vomiting)    Seasonal allergies     Surgical History: Past Surgical History:  Procedure Laterality Date   ABDOMINAL HYSTERECTOMY     CARPAL TUNNEL RELEASE Right 10/26/2015   Procedure: CARPAL TUNNEL RELEASE;  Surgeon: Arley Curia, MD;  Location: [facility];  Service: Orthopedics;  Laterality: Right;   CARPOMETACARPEL SUSPENSION PLASTY Right 10/26/2015   Procedure: RIGHT HAND TRAPEZIECTOMY WITH THUMB METACARPEL SUSPENSION  PLASTY;  Surgeon: Arley Curia, MD;  Location: [facility];  Service: Orthopedics;  Laterality: Right;   CATARACT EXTRACTION W/PHACO  12/23/2011   Procedure: CATARACT EXTRACTION PHACO AND INTRAOCULAR LENS PLACEMENT (IOC);  Surgeon: Dow JULIANNA Burke, MD;  Location: [facility];  Service: Ophthalmology;  Laterality: Right;  CDE=5.16   CHOLECYSTECTOMY     EYE SURGERY     cataract extraction of left eye-[facility]-2002   KNEE ARTHROSCOPY     right   NISSEN FUNDOPLICATION     [facility]   TRIGGER FINGER RELEASE Right 10/26/2015   Procedure: RELEASE A-1 PULLEY RIGHT RING FINGER;  Surgeon: Arley Curia, MD;  Location: [facility];  Service: Orthopedics;  Laterality: Right;    Home Medications:  Allergies as of 04/26/2024       Reactions   Cephalosporins Rash   Latex Rash   Breaks out then gets infected   Lactose Intolerance (gi) Nausea And Vomiting   Lactulose Nausea And Vomiting   Cephalexin Rash   Levofloxacin Anxiety, Rash   Meperidine  Nausea And Vomiting   Needed Phenergan  to combat nausea.   Meperidine  And Related Nausea And Vomiting   Needed Phenergan  to combat nausea.   Meperidine  Hcl Nausea And Vomiting   Needed Phenergan  to combat nausea.   Omeprazole Rash        Medication  List        Accurate as of April 26, 2024 11:47 AM. If you have any questions, ask your nurse or doctor.          acetaminophen  325 MG tablet Commonly known as: Tylenol  Take 2 tablets (650 mg total) by mouth every 6 (six) hours as needed for moderate pain.   albuterol  108 (90 Base) MCG/ACT inhaler Commonly known as: VENTOLIN  HFA USE 1 TO 2 INHALATIONS BY MOUTH  EVERY 6 HOURS AS NEEDED FOR  WHEEZING OR SHORTNESS OF BREATH   Balmex Adult Care 11.3 % Crea cream Generic drug: zinc oxide Apply 1 Application topically 2 (two) times daily. Apply twice a day to the  edematous legs   carbidopa -levodopa  25-100 MG tablet Commonly known as: SINEMET  IR Take 1 tablet by mouth 4 (four) times  daily.   chlorhexidine  4 % external liquid Commonly known as: Hibiclens  Apply topically daily as needed.   escitalopram 10 MG tablet Commonly known as: LEXAPRO Take 10 mg by mouth daily.   estradiol  0.1 MG/GM vaginal cream Commonly known as: ESTRACE  Place 1 g vaginally 3 (three) times a week.   famotidine  20 MG tablet Commonly known as: PEPCID  Take one dose before evening meal each day, may take second dose if needed. No more than two per day.   fluticasone  50 MCG/ACT nasal spray Commonly known as: FLONASE  Place 2 sprays into both nostrils daily.   fluticasone -salmeterol 230-21 MCG/ACT inhaler Commonly known as: Advair HFA Inhale 2 puffs into the lungs 2 (two) times daily.   gabapentin  100 MG capsule Commonly known as: NEURONTIN  TAKE 3 CAPSULES BY MOUTH 3 TIMES DAILY   Gemtesa  75 MG Tabs Generic drug: Vibegron  Take 1 tablet (75 mg total) by mouth daily.   guaiFENesin  100 MG/5ML liquid Commonly known as: ROBITUSSIN Take 5 mLs by mouth every 4 (four) hours as needed for cough or to loosen phlegm.   ketoconazole  2 % cream Commonly known as: NIZORAL  Apply 1 application topically daily.   lansoprazole  30 MG capsule Commonly known as: PREVACID  TAKE 1 CAPSULE BY MOUTH DAILY BEFORE BREAKFAST.   levocetirizine 5 MG tablet Commonly known as: XYZAL  Take 1 tablet (5 mg total) by mouth every evening.   levothyroxine  25 MCG tablet Commonly known as: SYNTHROID  Take 1 tablet (25 mcg total) by mouth daily.   lidocaine -prilocaine  cream Commonly known as: EMLA  Apply 1 Application topically as needed.   Medi-Patch-Lidocaine  0.5-0.035-5-20 % Ptch Generic drug: Lido-Capsaicin-Men-Methyl Sal Apply once a day to neuralgia area, right abdomen and below the rib cage.   montelukast  10 MG tablet Commonly known as: SINGULAIR  Take 1 tablet (10 mg total) by mouth at bedtime.   mupirocin  ointment 2 % Commonly known as: BACTROBAN  Apply 1 Application topically 2 (two) times daily.    ondansetron  4 MG disintegrating tablet Commonly known as: ZOFRAN -ODT Take 1 tablet (4 mg total) by mouth every 8 (eight) hours as needed for nausea or vomiting.   potassium chloride 10 MEQ CR capsule Commonly known as: MICRO-K Take 20 mEq by mouth daily.   torsemide  10 MG tablet Commonly known as: DEMADEX  1/2 to 1 tab daily   Vitamin D  (Ergocalciferol ) 1.25 MG (50000 UNIT) Caps capsule Commonly known as: DRISDOL  Take 1 capsule (50,000 Units total) by mouth every 7 (seven) days.        Allergies:  Allergies  Allergen Reactions   Cephalosporins Rash   Latex Rash    Breaks out then gets infected   Lactose Intolerance (Gi)  Nausea And Vomiting   Lactulose Nausea And Vomiting   Cephalexin Rash   Levofloxacin Anxiety and Rash   Meperidine  Nausea And Vomiting    Needed Phenergan  to combat nausea.   Meperidine  And Related Nausea And Vomiting    Needed Phenergan  to combat nausea.   Meperidine  Hcl Nausea And Vomiting    Needed Phenergan  to combat nausea.   Omeprazole Rash    Family History: Family History  Problem Relation Age of Onset   Stroke Mother    COPD Father    Stroke Father    Hearing loss Father    Hearing loss Brother    Hearing loss Brother    Colon cancer Neg Hx     Social History:  reports that she has never smoked. She has never been exposed to tobacco smoke. She has never used smokeless tobacco. She reports current alcohol use of about 1.0 standard drink of alcohol per week. She reports that she does not use drugs.   Physical Exam: BP 132/78   Pulse 99   Constitutional:  Alert and oriented, No acute distress. HEENT: [place] AT, moist mucus membranes.  Trachea midline, no masses. Cardiovascular: No clubbing, cyanosis, or edema. Respiratory: Normal respiratory effort, no increased work of breathing. GI: Abdomen is soft, nontender, nondistended, no abdominal masses GU: No CVA tenderness Skin: No rashes, bruises or suspicious lesions. Neurologic: Grossly  intact, no focal deficits, moving all 4 extremities. Psychiatric: Normal mood and affect.  Laboratory Data: Lab Results  Component Value Date   WBC 5.5 04/15/2024   HGB 12.2 04/15/2024   HCT 39.0 04/15/2024   MCV 93 04/15/2024   PLT 360 04/15/2024    Lab Results  Component Value Date   CREATININE 1.10 (H) 04/15/2024    No results found for: PSA  No results found for: TESTOSTERONE  Lab Results  Component Value Date   HGBA1C 6.1 (H) 04/15/2024    Urinalysis    Component Value Date/Time   COLORURINE YELLOW 11/07/2023 1810   APPEARANCEUR Cloudy (A) 04/16/2024 1313   LABSPEC 1.034 (H) 11/07/2023 1810   PHURINE 6.0 11/07/2023 1810   GLUCOSEU Negative 04/16/2024 1313   HGBUR SMALL (A) 11/07/2023 1810   BILIRUBINUR Negative 04/16/2024 1313   KETONESUR NEGATIVE 11/07/2023 1810   PROTEINUR Negative 04/16/2024 1313   PROTEINUR NEGATIVE 11/07/2023 1810   UROBILINOGEN 0.2 01/24/2023 1115   UROBILINOGEN 0.2 12/01/2009 1830   NITRITE Negative 04/16/2024 1313   NITRITE POSITIVE (A) 11/07/2023 1810   LEUKOCYTESUR 3+ (A) 04/16/2024 1313   LEUKOCYTESUR SMALL (A) 11/07/2023 1810    Lab Results  Component Value Date   LABMICR See below: 02/23/2024   WBCUA >30 (A) 02/23/2024   LABEPIT 0-10 02/23/2024   BACTERIA Many (A) 02/23/2024    Pertinent Imaging: N/a Assessment & Plan:    1. Microhematuria (Primary) F/u for exam and cystoscopy  - Urinalysis, Routine w reflex microscopic; Future - Urinalysis, Routine w reflex microscopic  2. Recurrent UTI - sent in a daily doxycycline  to deal with UTI and keep urine clear. Plan cysto in 4-6 weeks. She can take prn align and immodium from gu pt of view. Also discussed importance of regular TV estrogen use even if she has a UTI.   No follow-ups on file.  Donnice Brooks, MD  [facility]  [address] [place], [place] [address] [phone]

## 2024-04-27 ENCOUNTER — Ambulatory Visit

## 2024-04-30 LAB — URINE CULTURE

## 2024-05-03 ENCOUNTER — Ambulatory Visit: Payer: Self-pay

## 2024-05-03 DIAGNOSIS — N390 Urinary tract infection, site not specified: Secondary | ICD-10-CM

## 2024-05-03 NOTE — Telephone Encounter (Signed)
-----   Message from Donnice Brooks sent at 05/03/2024 11:17 AM EST ----- If she had bladder pain or dysuria send in doxycycline  100 mg, one po bid, # 14, no refill - thank you!  ----- Message ----- From: Gretta Carlos SAUNDERS, CMA Sent: 04/30/2024  12:06 PM EST To: Donnice Brooks, MD  Please review and advise

## 2024-05-03 NOTE — Telephone Encounter (Signed)
 Called pt and she state's she is currently taking doxycycline  100 mg daily. Pt state's she is still having painful urination along with burning when voided. Pt is aware a message will be sent to Dr. Nieves on other options. Voiced understanding.

## 2024-05-05 MED ORDER — CIPROFLOXACIN HCL 250 MG PO TABS: 250.0000 mg | ORAL_TABLET | Freq: Two times a day (BID) | ORAL | 0 refills | Status: DC

## 2024-05-05 NOTE — Telephone Encounter (Addendum)
-----   Message from [name] sent at 05/05/2024  9:04 AM EST ----- Pt was made aware and voiced understanding Okay, then send in Cipro  250 mg, 1 p.o. twice daily #10 no refills.  Let her know this is related to the Levaquin but it looks like she just had an adverse reaction to Levaquin and not a true  allergy. Thanks.  ----- Message ----- From: Gretta Carlos SAUNDERS, CMA Sent: 05/03/2024  12:11 PM EST To: Joanne Brooks, MD  ----- Message from Carlos SAUNDERS Gretta, CMA sent at 05/03/2024 12:11 PM EST -----  Please see encounter and advise ----- Message ----- From: Torres Donnice, MD Sent: 05/03/2024  11:17 AM EST To: Carlos SAUNDERS Gretta, CMA  If she had bladder pain or dysuria send in doxycycline  100 mg, one po bid, # 14, no refill - thank you!  ----- Message ----- From: Gretta Carlos SAUNDERS, CMA Sent: 04/30/2024  12:06 PM EST To: Joanne Brooks, MD  Please review and advise

## 2024-05-21 ENCOUNTER — Ambulatory Visit
Admission: EM | Admit: 2024-05-21 | Discharge: 2024-05-21 | Disposition: A | Discharge status: Home / Self Care | Attending: Family Medicine | Admitting: Family Medicine

## 2024-05-21 ENCOUNTER — Encounter: Payer: Self-pay | Admitting: Emergency Medicine

## 2024-05-21 ENCOUNTER — Ambulatory Visit: Payer: Self-pay

## 2024-05-21 DIAGNOSIS — L03115 Cellulitis of right lower limb: Secondary | ICD-10-CM

## 2024-05-21 DIAGNOSIS — R600 Localized edema: Secondary | ICD-10-CM | POA: Diagnosis not present

## 2024-05-21 MED ORDER — DOXYCYCLINE HYCLATE 100 MG PO CAPS: 100.0000 mg | ORAL_CAPSULE | Freq: Two times a day (BID) | ORAL | 0 refills | Status: AC

## 2024-05-21 NOTE — Discharge Instructions (Signed)
 Continue compression stockings, elevation, take the course of antibiotics and follow-up with primary care as soon as possible for recheck.

## 2024-05-21 NOTE — Telephone Encounter (Signed)
 FYI Only or Action Required?: Action required by provider: update on patient condition.- Patient to UC  Patient was last seen in primary care on 04/16/2024 by Edman Meade PEDLAR, FNP.  Called Nurse Triage reporting Leg Swelling.  Symptoms began yesterday.  Interventions attempted: Other: elevation.  Symptoms are: gradually worsening.  Triage Disposition: See HCP Within 4 Hours (Or PCP Triage)  Patient/caregiver understands and will follow disposition?: Yes  Copied from CRM #[id number]. Topic: Clinical - Red Word Triage >> May 21, 2024  9:16 AM Winona R wrote: left leg edema, wheeping. Reason for Disposition  SEVERE leg swelling (e.g., swelling extends above knee, entire leg is swollen, weeping fluid)  Answer Assessment - Initial Assessment Questions 1. ONSET: When did the swelling start? (e.g., minutes, hours, days)     Two nights ago 2. LOCATION: What part of the leg is swollen?  Are both legs swollen or just one leg?     Bilateral legs, right worse than left 3. SEVERITY: How bad is the swelling? (e.g., localized; mild, moderate, severe)     Moderate, extends up to knees 4. REDNESS: Is there redness or signs of infection?     Redness to right leg 5. PAIN: Is the swelling painful to touch? If Yes, ask: How painful is it?   (Scale 1-10; mild, moderate or severe)     Reports that her right leg is uncomfortable and tender to touch 6. FEVER: Do you have a fever? If Yes, ask: What is it, how was it measured, and when did it start?      Not measured, denies chills 7. CAUSE: What do you think is causing the leg swelling?     States that she may have been on her feet a little more than normal, but thinks that it is related to lymph nodes 8. MEDICAL HISTORY: Do you have a history of blood clots (e.g., DVT), cancer, heart failure, kidney disease, or liver failure?     Has been battling UTI, just finished Cipro  9. RECURRENT SYMPTOM: Have you had leg swelling before? If  Yes, ask: When was the last time? What happened that time?     Yes, oral antibiotic 10. OTHER SYMPTOMS: Do you have any other symptoms? (e.g., chest pain, difficulty breathing)       Describes a rattle in her chest, per her scale, patient is five pounds heavier, possibly fluid  Protocols used: Leg Swelling and Edema-A-AH

## 2024-05-21 NOTE — Telephone Encounter (Signed)
 Noted, patient going to urgent care.

## 2024-05-21 NOTE — ED Provider Notes (Signed)
 [facility]    CSN: [id number] Arrival date & time: 05/21/24  1246      History   Chief Complaint Chief Complaint  Patient presents with   right lower leg red and weeping    HPI [name] is a 89 y.o. female.   Patient presenting today with 1 week history of worsening right lower leg swelling, slight lower leg swelling on the left as well.  Now having redness, tingling, pain to the right lower extremity in the same area that she has had cellulitis in the past.  Denies weakness, numbness, tingling, fevers, chills, new injury to the area.  Trying compression, elevation and taking her torsemide  with minimal relief.    Past Medical History:  Diagnosis Date   Allergy    Ankle swelling    takes torsemide  prn   Anxiety    Arthritis    Asthma    allergy induced   Back pain    Complication of anesthesia    COVID 2023   Depression    GERD (gastroesophageal reflux disease)    HOH (hard of hearing)    Hypercholesterolemia    Hypothyroid    Parkinson disease (HCC)    PONV (postoperative nausea and vomiting)    Seasonal allergies     Patient Active Problem List   Diagnosis Date Noted   Cystitis 04/19/2024   Bilateral lower extremity edema 04/17/2024   Abdominal pain, left lower quadrant 12/22/2023   RLQ abdominal pain 12/22/2023   Lower abdominal pain 12/11/2023   Stooped posture 10/07/2023   Mild intermittent asthma with exacerbation 09/11/2023   Bilateral lower leg cellulitis 07/24/2023   Inversion of both nipples 12/18/2022   PHN (postherpetic neuralgia) 12/18/2022   Cellulitis 12/11/2021   Cellulitis of right hand 12/10/2021   Hypocalcemia 12/10/2021   Normocytic anemia 12/10/2021   Depression 12/10/2021   Hypothyroidism 12/10/2021   Edema of left lower extremity 07/10/2021   GERD (gastroesophageal reflux disease)    History of Nissen fundoplication    IBS (irritable bowel syndrome) 02/05/2018   Diarrhea 06/16/2017   N&V (nausea and  vomiting) 06/16/2017   Numbness 08/18/2015   Trigger ring finger of right hand 06/28/2015   Gastroesophageal reflux disease with esophagitis 06/15/2015   Bilateral carpal tunnel syndrome 06/15/2015   Resting tremor 06/15/2015   Primary localized osteoarthrosis, hand 05/15/2015   Carpal tunnel syndrome on left 05/15/2015   Carpal tunnel syndrome on right 05/15/2015   Scoliosis of thoracic spine 11/05/2013   Spinal stenosis, unspecified region other than cervical 12/03/2012   Spondylosis 12/03/2012   Parkinson disease (HCC)    Back pain    Seasonal allergies     Past Surgical History:  Procedure Laterality Date   ABDOMINAL HYSTERECTOMY     CARPAL TUNNEL RELEASE Right 10/26/2015   Procedure: CARPAL TUNNEL RELEASE;  Surgeon: Arley Curia, MD;  Location: [facility];  Service: Orthopedics;  Laterality: Right;   CARPOMETACARPEL SUSPENSION PLASTY Right 10/26/2015   Procedure: RIGHT HAND TRAPEZIECTOMY WITH THUMB METACARPEL SUSPENSION PLASTY;  Surgeon: Arley Curia, MD;  Location: [facility];  Service: Orthopedics;  Laterality: Right;   CATARACT EXTRACTION W/PHACO  12/23/2011   Procedure: CATARACT EXTRACTION PHACO AND INTRAOCULAR LENS PLACEMENT (IOC);  Surgeon: Dow JULIANNA Burke, MD;  Location: [facility];  Service: Ophthalmology;  Laterality: Right;  CDE=5.16   CHOLECYSTECTOMY     EYE SURGERY     cataract extraction of left eye-[facility]-2002   KNEE ARTHROSCOPY  right   NISSEN FUNDOPLICATION     [facility]   TRIGGER FINGER RELEASE Right 10/26/2015   Procedure: RELEASE A-1 PULLEY RIGHT RING FINGER;  Surgeon: Arley Curia, MD;  Location:  SUR[facility]ERY C[facility];  Service: Orthopedics;  Laterality: Right;    OB History     Gravida  3   Para  2   Term  2   Preterm      AB  1   Living  2      SAB  1   IAB      Ectopic      Multiple      Live Births  2            Home Medications    Prior to Admission medications  Medication Sig Start Date End Date  Taking? Authorizing Provider  doxycycline  (VIBRAMYCIN ) 100 MG capsule Take 1 capsule (100 mg total) by mouth 2 (two) times daily. 05/21/24  Yes Stuart Vernell Norris, PA-C  acetaminophen  (TYLENOL ) 325 MG tablet Take 2 tablets (650 mg total) by mouth every 6 (six) hours as needed for moderate pain. 06/11/21   Christopher Savannah, PA-C  albuterol  (VENTOLIN  HFA) 108 (90 Base) MCG/ACT inhaler USE 1 TO 2 INHALATIONS BY MOUTH  EVERY 6 HOURS AS NEEDED FOR  WHEEZING OR SHORTNESS OF BREATH 01/26/24   Bacchus, Gloria Z, FNP  carbidopa -levodopa  (SINEMET  IR) 25-100 MG tablet Take 1 tablet by mouth 4 (four) times daily. Patient not taking: Reported on 03/08/2024 04/09/23   Dohmeier, Dedra, MD  escitalopram (LEXAPRO) 10 MG tablet Take 10 mg by mouth daily. Patient not taking: Reported on 03/08/2024 11/26/21   [provider]  estradiol  (ESTRACE ) 0.1 MG/GM vaginal cream Place 1 g vaginally 3 (three) times a week. 02/23/24 02/22/25  Nieves Cough, MD  famotidine  (PEPCID ) 20 MG tablet Take one dose before evening meal each day, may take second dose if needed. No more than two per day. 01/01/22   Rudy Josette RAMAN, PA-C  fluticasone  (FLONASE ) 50 MCG/ACT nasal spray Place 2 sprays into both nostrils daily. 01/24/23   Leath-Warren, Etta PARAS, NP  fluticasone -salmeterol (ADVAIR HFA) 230-21 MCG/ACT inhaler Inhale 2 puffs into the lungs 2 (two) times daily. 07/24/23   Del Wilhelmena Falter, Hilario, FNP  gabapentin  (NEURONTIN ) 100 MG capsule TAKE 3 CAPSULES BY MOUTH 3 TIMES DAILY 10/07/23   Bacchus, Gloria Z, FNP  ketoconazole  (NIZORAL ) 2 % cream Apply 1 application topically daily. 06/11/21   Christopher Savannah, PA-C  lansoprazole  (PREVACID ) 30 MG capsule TAKE 1 CAPSULE BY MOUTH DAILY BEFORE BREAKFAST. 01/13/24   Rudy Josette RAMAN, PA-C  levocetirizine (XYZAL ) 5 MG tablet Take 1 tablet (5 mg total) by mouth every evening. 09/11/23   Bacchus, Gloria Z, FNP  levothyroxine  (SYNTHROID ) 25 MCG tablet Take 1 tablet (25 mcg total) by mouth daily.  04/23/24   Bacchus, Gloria Z, FNP  Lido-Capsaicin-Men-Methyl Sal (MEDI-PATCH-LIDOCAINE ) 0.5-0.035-5-20 % PTCH Apply once a day to neuralgia area, right abdomen and below the rib cage. 12/03/22   Dohmeier, Dedra, MD  lidocaine -prilocaine  (EMLA ) cream Apply 1 Application topically as needed. 10/07/23   Dohmeier, Dedra, MD  montelukast  (SINGULAIR ) 10 MG tablet Take 1 tablet (10 mg total) by mouth at bedtime. 04/12/24   Bacchus, Gloria Z, FNP  mupirocin  ointment (BACTROBAN ) 2 % Apply 1 Application topically 2 (two) times daily. 10/16/23   Stuart Vernell Norris, PA-C  ondansetron  (ZOFRAN -ODT) 4 MG disintegrating tablet Take 1 tablet (4 mg total) by mouth every 8 (eight) hours as needed  for nausea or vomiting. 12/11/23   Bacchus, Gloria Z, FNP  potassium chloride (MICRO-K) 10 MEQ CR capsule Take 20 mEq by mouth daily.  08/13/13   [provider]  torsemide  (DEMADEX ) 10 MG tablet 1/2 to 1 tab daily 04/12/24   Bacchus, Gloria Z, FNP  Vibegron  (GEMTESA ) 75 MG TABS Take 1 tablet (75 mg total) by mouth daily. 02/23/24   Nieves Cough, MD  Vitamin D , Ergocalciferol , (DRISDOL ) 1.25 MG (50000 UNIT) CAPS capsule Take 1 capsule (50,000 Units total) by mouth every 7 (seven) days. 12/15/23   Bacchus, Gloria Z, FNP  zinc oxide (BALMEX ADULT CARE) 11.3 % CREA cream Apply 1 Application topically 2 (two) times daily. Apply twice a day to the  edematous legs 10/07/23   Dohmeier, Dedra, MD    Family History Family History  Problem Relation Age of Onset   Stroke Mother    COPD Father    Stroke Father    Hearing loss Father    Hearing loss Brother    Hearing loss Brother    Colon cancer Neg Hx     Social History Social History[1]   Allergies   Cephalosporins, Latex, Lactose intolerance (gi), Lactulose, Cephalexin, Levofloxacin, Meperidine , Meperidine  and related, Meperidine  hcl, and Omeprazole   Review of Systems Review of Systems PER HPI  Physical Exam Triage Vital Signs ED Triage Vitals  Encounter  Vitals Group     BP 05/21/24 1400 (!) 159/81     Girls Systolic BP Percentile --      Girls Diastolic BP Percentile --      Boys Systolic BP Percentile --      Boys Diastolic BP Percentile --      Pulse Rate 05/21/24 1400 (!) 101     Resp 05/21/24 1400 18     Temp 05/21/24 1400 (!) 97.5 F (36.4 C)     Temp Source 05/21/24 1400 Oral     SpO2 05/21/24 1400 96 %     Weight --      Height --      Head Circumference --      Peak Flow --      Pain Score 05/21/24 1403 3     Pain Loc --      Pain Education --      Exclude from Growth Chart --    No data found.  Updated Vital Signs BP (!) 159/81 (BP Location: Right Arm)   Pulse (!) 101   Temp (!) 97.5 F (36.4 C) (Oral)   Resp 18   SpO2 96%   Visual Acuity Right Eye Distance:   Left Eye Distance:   Bilateral Distance:    Right Eye Near:   Left Eye Near:    Bilateral Near:     Physical Exam Vitals and nursing note reviewed.  Constitutional:      Appearance: Normal appearance. She is not ill-appearing.  HENT:     Head: Atraumatic.  Eyes:     Extraocular Movements: Extraocular movements intact.     Conjunctiva/sclera: Conjunctivae normal.  Cardiovascular:     Rate and Rhythm: Normal rate.  Pulmonary:     Effort: Pulmonary effort is normal.  Musculoskeletal:        General: Swelling and tenderness present. Normal range of motion.     Cervical back: Normal range of motion and neck supple.     Comments: Bilateral lower legs mildly edematous, right lower leg tender to palpation, erythematous to the lower aspect particularly anteriorly.  No  active bleeding or drainage, no fluctuance.  Skin:    General: Skin is warm and dry.     Findings: Erythema present.  Neurological:     Mental Status: She is alert and oriented to person, place, and time.     Comments: Right lower extremity neurovascularly intact  Psychiatric:        Mood and Affect: Mood normal.        Thought Content: Thought content normal.        Judgment:  Judgment normal.      UC Treatments / Results  Labs (all labs ordered are listed, but only abnormal results are displayed) Labs Reviewed - No data to display  EKG   Radiology No results found.  Procedures Procedures (including critical care time)  Medications Ordered in UC Medications - No data to display  Initial Impression / Assessment and Plan / UC Course  I have reviewed the triage vital signs and the nursing notes.  Pertinent labs & imaging results that were available during my care of the patient were reviewed by me and considered in my medical decision making (see chart for details).     Will cover with doxycycline  for right leg cellulitis and continue torsemide , compression, elevation to help with leg edema.  Return for worsening or unresolving symptoms.  Final Clinical Impressions(s) / UC Diagnoses   Final diagnoses:  Bilateral leg edema  Cellulitis of leg, right     Discharge Instructions      Continue compression stockings, elevation, take the course of antibiotics and follow-up with primary care as soon as possible for recheck.    ED Prescriptions     Medication Sig Dispense Auth. Provider   doxycycline  (VIBRAMYCIN ) 100 MG capsule Take 1 capsule (100 mg total) by mouth 2 (two) times daily. 20 capsule Stuart Vernell Norris, [place]      PDMP not reviewed this encounter.    [1]  Social History Tobacco Use   Smoking status: Never    Passive exposure: Never   Smokeless tobacco: Never  Vaping Use   Vaping status: Never Used  Substance Use Topics   Alcohol use: Yes    Alcohol/week: 1.0 standard drink of alcohol    Comment: occ   Drug use: No     Stuart Vernell Norris, PA-C 05/21/24 1649

## 2024-05-21 NOTE — ED Triage Notes (Signed)
 Redness and weeping fluid on right lower leg since yesterday.  States left foot is swollen.  States has gain 5 pounds over 1 week.

## 2024-05-24 ENCOUNTER — Ambulatory Visit: Admitting: Urology

## 2024-05-24 ENCOUNTER — Ambulatory Visit: Payer: Self-pay

## 2024-05-24 VITALS — BP 139/78 | HR 102

## 2024-05-24 DIAGNOSIS — R3129 Other microscopic hematuria: Secondary | ICD-10-CM

## 2024-05-24 LAB — URINALYSIS, ROUTINE W REFLEX MICROSCOPIC
Bilirubin, UA: NEGATIVE
Glucose, UA: NEGATIVE
Ketones, UA: NEGATIVE
Nitrite, UA: POSITIVE — AB
Protein,UA: NEGATIVE
RBC, UA: NEGATIVE
Specific Gravity, UA: <1.005 — ABNORMAL LOW (ref 1.005–1.030)
Urobilinogen, Ur: 0.2 mg/dL (ref 0.2–1.0)
pH, UA: 6 (ref 5.0–7.5)

## 2024-05-24 LAB — MICROSCOPIC EXAMINATION

## 2024-05-24 MED ORDER — ESTRADIOL 0.01 % VA CREA: 1.0000 | TOPICAL_CREAM | VAGINAL | 12 refills | Status: AC

## 2024-05-24 MED ORDER — CIPROFLOXACIN HCL 500 MG PO TABS
500.0000 mg | ORAL_TABLET | Freq: Once | ORAL | Status: AC
  Administered 2024-05-24: 11:00:00 500 mg via ORAL

## 2024-05-24 NOTE — Progress Notes (Signed)
°  [place]  05/24/2024  CC: No chief complaint on file.   HPI:  F/u -    1) incontinence - she leaks with some urgency and once starts loses it. Turn key and water running. She uses 3-4 depends. If she voids q 2 hours less wet. Stream OK. No GU Meds or surgery.  NG risk includes a movement disorder (like parkinson's).    2) recurrent UTI - She has occasional dysuria. No gross hematuria. She got 3-4 UTI. Jul 2025 CT a/p - benign GU. R>L ERP. 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 Bladder not imaged. No breast cancer. Sep 2025 started TVE. Ok with Dr. Ozan  3) MH - noted on UA. Jul 2025 CT a/p - benign GU. R>L ERP. Aug 2025 US  pelvis - right adnexal mass with cystic and solid components,posterior shadowing,avascular ~7 cm, left ovary not visualized. Bladder not imaged.   Today, seen for cystoscopy. Treated for UTI in NOV - klebsiella - with Bactrim  but R to bactrim , NF, ampicillin.  We switched her to doxycycline  and she still had some symptoms and switched her to Cipro .  She completed the Cipro .  She is back on doxycycline  for cellulitis.  She has no dysuria.    There were no vitals taken for this visit. NED. A&Ox3.   No respiratory distress   Abd soft, NT, ND Normal external genitalia with patent urethral meatus Introitus slightly narrow.  No vulvar or vaginal lesions.  Meatus appears normal.  Chaperone for exam and cystoscopy-Alisheya  Cystoscopy Procedure Note  Patient identification was confirmed, informed consent was obtained, and patient was prepped using Betadine solution.  Lidocaine  jelly was administered per urethral meatus.    Procedure: - Flexible cystoscope introduced, without any difficulty.   - Thorough search of the bladder revealed:    normal urethral meatus    normal urothelium    no stones    no ulcers     no tumors     no urethral polyps    no trabeculation  - Ureteral orifices were normal in position and appearance.  Post-Procedure: - Patient tolerated the procedure well  Assessment/ Plan:  1) MH -benign imaging and exam  2) recurrent UTI-estrogen refilled   No follow-ups on file.  Donnice Brooks, MD

## 2024-06-04 ENCOUNTER — Other Ambulatory Visit: Payer: Self-pay | Admitting: Family Medicine

## 2024-06-04 DIAGNOSIS — R600 Localized edema: Secondary | ICD-10-CM

## 2024-06-14 ENCOUNTER — Telehealth: Payer: Self-pay

## 2024-06-14 NOTE — Telephone Encounter (Signed)
 [name]

## 2024-06-22 ENCOUNTER — Encounter: Payer: Self-pay | Admitting: Neurology

## 2024-06-22 ENCOUNTER — Ambulatory Visit: Admitting: Neurology

## 2024-06-22 VITALS — BP 151/92 | HR 96 | Ht 60.0 in | Wt 167.0 lb

## 2024-06-22 DIAGNOSIS — G20C Parkinsonism, unspecified: Secondary | ICD-10-CM | POA: Diagnosis not present

## 2024-06-22 MED ORDER — MAGNESIUM GLYCINATE 100 MG PO CAPS: 100.0000 mg | ORAL_CAPSULE | Freq: Two times a day (BID) | ORAL | Status: AC

## 2024-06-22 NOTE — Patient Instructions (Signed)
" ° °  ASSESSMENT AND PLAN :   89 y.o. year old female patient with tremor, rigidity and stooped posture, here with familiar atypical PD;     1) Parkinson's disease versus parkinsonism   ( presumed)  with resting tremor.   DAT scan 10-2023 : No clear reduced radiotracer activity in basal ganglia to suggest Parkinson's syndrome pathology.    2) Carbi/levo dopa trial tid . : Sinemet  did not help.  She felt better when she was allowed to wean off.    3) scoliosis and edema are affecting posture and  gait stability . Gabapentin  can make swelling worse.   Plan : I want to repeat a brain MRI , as I am still not clear what disease process is underlying the tremor and elevated tone. Hyperreflexia is also present.  I order without contrast.   RV in 12 months or prn.    I would like to thank Edman Meade PEDLAR, [facility] [address] [place],  [place] [address]  ( Dr Patel's office ) for allowing me to meet with this pleasant patient.    After spending a total time of  30  minutes face to face and time for  history taking, physical and neurologic examination, review of laboratory studies,  personal review of imaging studies, reports and results of other testing and review of referral information / records as far as provided in visit,  "

## 2024-06-22 NOTE — Progress Notes (Signed)
 "        Provider:  Dedra Gores, MD  Primary Care Physician:  Edman Meade PEDLAR, FNP [address] [place] [address]     Referring Provider: Edman Meade PEDLAR, Fnp [address] [place],  [place] [address]          Chief Complaint according to patient   Patient presents with:                HISTORY OF PRESENT ILLNESS:   [name] is a 89 y.o. female patient who is here for revisit 06/22/2024 for  follow up on general issues with mobility, leg swelling, rigor.  She is wearing compression stockings, she walks with a cane, in any shop she will lean onto the cart, using it like a walker. She noted progressive stooping.  She is also coughing and is breathing harder.  She went ot the Ed on 05-21-2024:     [name] is a 89 y.o. female who was seen at the [facility] 05-21-2024:   Patient presenting today with 1 week history of worsening right lower leg swelling, slight lower leg swelling on the left as well.  Now having redness, tingling, pain to the right lower extremity in the same area that she has had cellulitis in the past.  Denies weakness, numbness, tingling, fevers, chills, new injury to the area.  Trying compression, elevation and taking her torsemide  with minimal relief.          Past Medical History:  Diagnosis Date   Allergy     Ankle swelling      takes torsemide  prn   Anxiety     Arthritis     Asthma      allergy induced   Back pain     Complication of anesthesia     COVID 2023   Depression     GERD (gastroesophageal reflux disease)     HOH (hard of hearing)     Hypercholesterolemia     Hypothyroid     Parkinson disease / Parkinsonism (HCC) with mildly abnormal DAT scan      PONV (postoperative nausea and vomiting)     Seasonal allergies          Review of Systems: Out of a complete 14 system review, the patient complains of only the following symptoms, and all other reviewed systems are negative.:   SLEEPINESS ?  How likely  are you to doze in the following situations: 0 = not likely, 1 = slight chance, 2 = moderate chance, 3 = high chance      Social History   Socioeconomic History   Marital status: Widowed    Spouse name: Not on file   Number of children: 2   Years of education: Not on file   Highest education level: 12th grade  Occupational History    Comment: retired  Tobacco Use   Smoking status: Never    Passive exposure: Never   Smokeless tobacco: Never  Vaping Use   Vaping status: Never Used  Substance and Sexual Activity   Alcohol use: Yes    Alcohol/week: 1.0 standard drink of alcohol    Comment: occ   Drug use: No   Sexual activity: Not Currently    Birth control/protection: Surgical  Other Topics Concern   Not on file  Social History Narrative   ** Merged History Encounter ** Patient lives at home alone and she is widowed.  Retired.    Education one year business course.   Right handed.   Caffeine two cup of coffee daily and 4 cups of sweet tea   Social Drivers of Health   Tobacco Use: Low Risk (06/22/2024)   Patient History    Smoking Tobacco Use: Never    Smokeless Tobacco Use: Never    Passive Exposure: Never  Financial Resource Strain: Low Risk (03/08/2024)   Overall Financial Resource Strain (CARDIA)    Difficulty of Paying Living Expenses: Not hard at all  Food Insecurity: No Food Insecurity (03/08/2024)   Epic    Worried About Radiation Protection Practitioner of Food in the Last Year: Never true    Ran Out of Food in the Last Year: Never true  Transportation Needs: No Transportation Needs (03/08/2024)   Epic    Lack of Transportation (Medical): No    Lack of Transportation (Non-Medical): No  Physical Activity: Insufficiently Active (03/08/2024)   Exercise Vital Sign    Days of Exercise per Week: 1 day    Minutes of Exercise per Session: 10 min  Stress: No Stress Concern Present (03/08/2024)   [employer] of Occupational Health - Occupational Stress Questionnaire    Feeling of  Stress: Not at all  Recent Concern: Stress - Stress Concern Present (01/05/2024)   [employer] of Occupational Health - Occupational Stress Questionnaire    Feeling of Stress: To some extent  Social Connections: Moderately Integrated (03/08/2024)   Social Connection and Isolation Panel    Frequency of Communication with Friends and Family: More than three times a week    Frequency of Social Gatherings with Friends and Family: More than three times a week    Attends Religious Services: More than 4 times per year    Active Member of [employer] or Organizations: Yes    Attends Banker Meetings: More than 4 times per year    Marital Status: Widowed  Depression (PHQ2-9): Low Risk (04/12/2024)   Depression (PHQ2-9)    PHQ-2 Score: 0  Alcohol Screen: Low Risk (03/08/2024)   Alcohol Screen    Last Alcohol Screening Score (AUDIT): 0  Housing: Low Risk (03/08/2024)   Epic    Unable to Pay for Housing in the Last Year: No    Number of Times Moved in the Last Year: 0    Homeless in the Last Year: No  Utilities: Not At Risk (03/08/2024)   Epic    Threatened with loss of utilities: No  Health Literacy: Adequate Health Literacy (03/08/2024)   B1300 Health Literacy    Frequency of need for help with medical instructions: Never    Family History  Problem Relation Age of Onset   Stroke Mother    COPD Father    Stroke Father    Hearing loss Father    Hearing loss Brother    Hearing loss Brother    Colon cancer Neg Hx     Past Medical History:  Diagnosis Date   Allergy    Ankle swelling    takes torsemide  prn   Anxiety    Arthritis    Asthma    allergy induced   Back pain    Complication of anesthesia    COVID 2023   Depression    GERD (gastroesophageal reflux disease)    HOH (hard of hearing)    Hypercholesterolemia    Hypothyroid    Parkinson disease (HCC)    PONV (postoperative nausea and vomiting)    Seasonal allergies  Past Surgical History:  Procedure  Laterality Date   ABDOMINAL HYSTERECTOMY     CARPAL TUNNEL RELEASE Right 10/26/2015   Procedure: CARPAL TUNNEL RELEASE;  Surgeon: Arley Curia, MD;  Location: [facility];  Service: Orthopedics;  Laterality: Right;   CARPOMETACARPEL SUSPENSION PLASTY Right 10/26/2015   Procedure: RIGHT HAND TRAPEZIECTOMY WITH THUMB METACARPEL SUSPENSION PLASTY;  Surgeon: Arley Curia, MD;  Location: [facility];  Service: Orthopedics;  Laterality: Right;   CATARACT EXTRACTION W/PHACO  12/23/2011   Procedure: CATARACT EXTRACTION PHACO AND INTRAOCULAR LENS PLACEMENT (IOC);  Surgeon: Dow JULIANNA Burke, MD;  Location: [facility];  Service: Ophthalmology;  Laterality: Right;  CDE=5.16   CHOLECYSTECTOMY     EYE SURGERY     cataract extraction of left eye-[facility]-2002   KNEE ARTHROSCOPY     right   NISSEN FUNDOPLICATION     [facility]   TRIGGER FINGER RELEASE Right 10/26/2015   Procedure: RELEASE A-1 PULLEY RIGHT RING FINGER;  Surgeon: Arley Curia, MD;  Location: [facility];  Service: Orthopedics;  Laterality: Right;    Mild decreased radiotracer activity posterior RIGHT putamen compared to the LEFT. Relative intense radiotracer activity in the heads of the caudate nuclei.   No clear loss of activity to suggest Parkinson's syndrome pathology.  Allergies[1]   DIAGNOSTIC DATA (LABS, IMAGING, TESTING) - I reviewed patient records, labs, notes, testing and imaging myself where available.  Lab Results  Component Value Date   WBC 5.5 04/15/2024   HGB 12.2 04/15/2024   HCT 39.0 04/15/2024   MCV 93 04/15/2024   PLT 360 04/15/2024      Component Value Date/Time   NA 139 04/15/2024 1123   K 5.1 04/15/2024 1123   CL 104 04/15/2024 1123   CO2 21 04/15/2024 1123   GLUCOSE 99 04/15/2024 1123   GLUCOSE 119 (H) 11/07/2023 1835   BUN 19 04/15/2024 1123   CREATININE 1.10 (H) 04/15/2024 1123   CALCIUM  9.6 04/15/2024 1123   PROT 6.7 04/15/2024 1123   ALBUMIN 4.2 04/15/2024 1123   AST 18  04/15/2024 1123   ALT 19 04/15/2024 1123   ALKPHOS 134 (H) 04/15/2024 1123   BILITOT 0.3 04/15/2024 1123   GFRNONAA 58 (L) 11/07/2023 1835   GFRAA >60 10/24/2015 1038   Lab Results  Component Value Date   CHOL 168 04/15/2024   HDL 39 (L) 04/15/2024   LDLCALC 109 (H) 04/15/2024   TRIG 109 04/15/2024   CHOLHDL 4.3 04/15/2024   Lab Results  Component Value Date   HGBA1C 6.1 (H) 04/15/2024   Lab Results  Component Value Date   VITAMINB12 1,147 12/11/2023   Lab Results  Component Value Date   TSH 4.560 (H) 04/15/2024    PHYSICAL EXAM:  Vitals:   06/22/24 1439  BP: (!) 151/92  Pulse: 96   No data found. Body mass index is 32.61 kg/m.   Wt Readings from Last 3 Encounters:  06/22/24 167 lb (75.8 kg)  04/16/24 161 lb (73 kg)  04/12/24 161 lb (73 kg)     Ht Readings from Last 3 Encounters:  06/22/24 5' (1.524 m)  04/16/24 5' (1.524 m)  04/12/24 5' (1.524 m)      General: The patient is awake, alert and appears not in acute distress and groomed. She appears leaning to the right, has muscle rigidity and some resting tremor.  Head: Normocephalic, atraumatic.  Neck is supple.  Cardiovascular:  Regular rate and cardiac rhythm by pulse, without distended neck veins.  Respiratory: no shortness of breath  Skin:  Ankle  and calf edema, no  rash. Wears compression stockings.  Trunk: BMI is 32.6     NEUROLOGIC EXAM: The patient is awake and alert, oriented to place and time.   Memory subjective described as intact.  Attention span & concentration ability appears normal.   Stooped at neck and shoulders.  Speech is fluent,  without  dysarthria, there is mild dysphonia ( and not low Volume !!)  or aphasia.  She noted drooling.  Mood and affect are appropriate.   Neurological Examination: Mental Status: Intact.  No obvious cognitive deficits. Cranial Nerves reports loss of sense of smell, decreased sense of taste  : Intact. PERL. EOMI. VFF. No diplopia  opia.  No  nystagmus.  No facial droop.  Normal facial expression and no masked face.  No ptosis.  Hearing is grossly intact bilaterally.  The tongue is normal and midline. Motor:  resting tremors.   Stooped posture and rigidity.  Mild cogwheeling over each biceps,   Sensory: Grossly intact throughout to all modalities. Reflexes: Brisk /symmetric  at patella level.    ASSESSMENT AND PLAN :   89 y.o. year old female patient with tremor, rigidity and stooped posture, here with familiar atypical PD;     1) Parkinson's disease versus parkinsonism   ( presumed)  with resting tremor.   DAT scan 10-2023 : No clear reduced radiotracer activity in basal ganglia to suggest Parkinson's syndrome pathology.    2) Carbi/levo dopa trial tid . : Sinemet  did not help.  She felt better when she was allowed to wean off.    3) scoliosis and edema are affecting posture and  gait stability . Gabapentin  can make swelling worse.   Plan : I want to repeat a brain MRI , as I am still not clear what disease process is underlying the tremor and elevated tone. Hyperreflexia is also present.  I order without contrast.   RV in 12 months or prn.    I would like to thank Edman Meade PEDLAR, Fnp [address] [place],  [place] [address]  ( Dr Patel's office ) for allowing me to meet with this pleasant patient.    After spending a total time of  30  minutes face to face and time for  history taking, physical and neurologic examination, review of laboratory studies,  personal review of imaging studies, reports and results of other testing and review of referral information / records as far as provided in visit,   Electronically signed by: Dedra Gores, MD 06/22/2024 2:44 PM  [facility] Board certified by [employer] of Sleep Medicine and Diplomate of the [employer] of Sleep Medicine. Board certified In Neurology through the ABPN, Fellow of the [employer] of  Neurology.       [1]  Allergies Allergen Reactions   Cephalosporins Rash   Latex Rash    Breaks out then gets infected   Lactose Intolerance (Gi) Nausea And Vomiting   Lactulose Nausea And Vomiting   Cephalexin Rash   Levofloxacin Anxiety and Rash   Meperidine  Nausea And Vomiting    Needed Phenergan  to combat nausea.   Meperidine  And Related Nausea And Vomiting    Needed Phenergan  to combat nausea.   Meperidine  Hcl Nausea And Vomiting    Needed Phenergan  to combat nausea.   Omeprazole Rash   "

## 2024-06-23 ENCOUNTER — Telehealth: Payer: Self-pay | Admitting: Neurology

## 2024-06-23 NOTE — Telephone Encounter (Signed)
MRI order sent to [facility] [phone]

## 2024-06-24 ENCOUNTER — Telehealth: Payer: Self-pay

## 2024-06-24 NOTE — Telephone Encounter (Signed)
 faxed

## 2024-06-24 NOTE — Telephone Encounter (Signed)
 Copied from CRM [id number]. Topic: Clinical - Medication Question >> Jun 24, 2024  1:23 PM Antwanette L wrote: Reason for CRM: May from [facility] called requesting the pt most active medication prescriptions. She didn't have the names of the medication. She 's requesting that the active medication list be fax to [phone]. May can be reached at [phone].

## 2024-06-28 ENCOUNTER — Other Ambulatory Visit: Admitting: Urology

## 2024-06-29 ENCOUNTER — Encounter: Payer: Self-pay | Admitting: Neurology

## 2024-07-08 ENCOUNTER — Telehealth: Payer: Self-pay

## 2024-07-08 ENCOUNTER — Other Ambulatory Visit

## 2024-07-08 MED ORDER — LANSOPRAZOLE 30 MG PO CPDR: 30.0000 mg | DELAYED_RELEASE_CAPSULE | Freq: Every day | ORAL | 2 refills | Status: AC

## 2024-07-08 NOTE — Telephone Encounter (Signed)
 Refill request for LANSOPRAZOLE  DR 30 MG CAPSULE QTY: 90 was received via fax from [facility]. Pt was last seen in office on 12/22/2023.

## 2024-07-08 NOTE — Addendum Note (Signed)
 Addended by: EZZARD SONNY RAMAN on: 07/08/2024 04:27 PM   Modules accepted: Orders

## 2024-07-09 ENCOUNTER — Other Ambulatory Visit: Payer: Self-pay | Admitting: Family Medicine

## 2024-07-09 DIAGNOSIS — T7840XD Allergy, unspecified, subsequent encounter: Secondary | ICD-10-CM

## 2024-07-22 ENCOUNTER — Other Ambulatory Visit

## 2024-09-08 ENCOUNTER — Ambulatory Visit: Admitting: Internal Medicine

## 2024-11-22 ENCOUNTER — Ambulatory Visit: Admitting: Urology

## 2025-03-09 ENCOUNTER — Ambulatory Visit

## 2025-04-25 ENCOUNTER — Ambulatory Visit: Admitting: Neurology
